# Patient Record
Sex: Female | Born: 1965
Health system: Southern US, Community
[De-identification: ages and names within clinical notes are randomized; demographics above are authoritative.]

## PROBLEM LIST (undated history)

## (undated) DIAGNOSIS — K219 Gastro-esophageal reflux disease without esophagitis: Secondary | ICD-10-CM

## (undated) DIAGNOSIS — J45909 Unspecified asthma, uncomplicated: Secondary | ICD-10-CM

## (undated) DIAGNOSIS — K829 Disease of gallbladder, unspecified: Secondary | ICD-10-CM

## (undated) DIAGNOSIS — I82409 Acute embolism and thrombosis of unspecified deep veins of unspecified lower extremity: Secondary | ICD-10-CM

## (undated) DIAGNOSIS — E785 Hyperlipidemia, unspecified: Secondary | ICD-10-CM

## (undated) DIAGNOSIS — N879 Dysplasia of cervix uteri, unspecified: Secondary | ICD-10-CM

## (undated) DIAGNOSIS — K769 Liver disease, unspecified: Secondary | ICD-10-CM

## (undated) DIAGNOSIS — M545 Low back pain, unspecified: Secondary | ICD-10-CM

## (undated) DIAGNOSIS — I10 Essential (primary) hypertension: Secondary | ICD-10-CM

## (undated) DIAGNOSIS — M1990 Unspecified osteoarthritis, unspecified site: Secondary | ICD-10-CM

## (undated) DIAGNOSIS — I2699 Other pulmonary embolism without acute cor pulmonale: Secondary | ICD-10-CM

## (undated) DIAGNOSIS — E669 Obesity, unspecified: Secondary | ICD-10-CM

## (undated) DIAGNOSIS — D649 Anemia, unspecified: Secondary | ICD-10-CM

## (undated) DIAGNOSIS — D869 Sarcoidosis, unspecified: Secondary | ICD-10-CM

## (undated) DIAGNOSIS — G4733 Obstructive sleep apnea (adult) (pediatric): Secondary | ICD-10-CM

## (undated) DIAGNOSIS — G4730 Sleep apnea, unspecified: Secondary | ICD-10-CM

## (undated) DIAGNOSIS — R011 Cardiac murmur, unspecified: Secondary | ICD-10-CM

## (undated) DIAGNOSIS — Z86718 Personal history of other venous thrombosis and embolism: Secondary | ICD-10-CM

## (undated) HISTORY — DX: Obesity, unspecified: E66.9

## (undated) HISTORY — DX: Hyperlipidemia, unspecified: E78.5

## (undated) HISTORY — DX: Gastro-esophageal reflux disease without esophagitis: K21.9

## (undated) HISTORY — DX: Low back pain: M54.5

## (undated) HISTORY — PX: TUBAL LIGATION: SHX77

## (undated) HISTORY — DX: Anemia, unspecified: D64.9

## (undated) HISTORY — DX: Unspecified asthma, uncomplicated: J45.909

## (undated) HISTORY — DX: Personal history of other venous thrombosis and embolism: Z86.718

## (undated) HISTORY — DX: Sleep apnea, unspecified: G47.30

## (undated) HISTORY — DX: Low back pain, unspecified: M54.50

## (undated) HISTORY — PX: GYNECOLOGIC CRYOSURGERY: SHX857

## (undated) HISTORY — DX: Other pulmonary embolism without acute cor pulmonale: I26.99

## (undated) HISTORY — DX: Sarcoidosis, unspecified: D86.9

## (undated) HISTORY — DX: Dysplasia of cervix uteri, unspecified: N87.9

## (undated) HISTORY — PX: CHOLECYSTECTOMY: SHX55

## (undated) HISTORY — DX: Disease of gallbladder, unspecified: K82.9

## (undated) HISTORY — DX: Liver disease, unspecified: K76.9

## (undated) HISTORY — PX: KNEE ARTHROSCOPY: SUR90

## (undated) HISTORY — DX: Essential (primary) hypertension: I10

## (undated) HISTORY — PX: BUNIONECTOMY: SHX129

## (undated) HISTORY — DX: Obstructive sleep apnea (adult) (pediatric): G47.33

## (undated) HISTORY — DX: Cardiac murmur, unspecified: R01.1

## (undated) HISTORY — DX: Acute embolism and thrombosis of unspecified deep veins of unspecified lower extremity: I82.409

## (undated) HISTORY — PX: COLPOSCOPY: SHX161

## (undated) HISTORY — DX: Unspecified osteoarthritis, unspecified site: M19.90

---

## 1998-04-12 ENCOUNTER — Other Ambulatory Visit: Admission: RE | Admit: 1998-04-12 | Discharge: 1998-04-12 | Payer: Self-pay | Admitting: Obstetrics and Gynecology

## 1999-09-21 ENCOUNTER — Encounter: Payer: Self-pay | Admitting: Family Medicine

## 1999-09-21 ENCOUNTER — Encounter: Admission: RE | Admit: 1999-09-21 | Discharge: 1999-09-21 | Payer: Self-pay | Admitting: Family Medicine

## 2000-04-18 ENCOUNTER — Encounter: Payer: Self-pay | Admitting: Family Medicine

## 2000-04-18 ENCOUNTER — Encounter: Admission: RE | Admit: 2000-04-18 | Discharge: 2000-04-18 | Payer: Self-pay | Admitting: Family Medicine

## 2000-05-14 ENCOUNTER — Encounter: Payer: Self-pay | Admitting: Gastroenterology

## 2000-05-14 ENCOUNTER — Ambulatory Visit (HOSPITAL_COMMUNITY): Admission: RE | Admit: 2000-05-14 | Discharge: 2000-05-14 | Payer: Self-pay | Admitting: Gastroenterology

## 2000-05-28 ENCOUNTER — Encounter: Payer: Self-pay | Admitting: Gastroenterology

## 2000-05-28 ENCOUNTER — Encounter: Admission: RE | Admit: 2000-05-28 | Discharge: 2000-05-28 | Payer: Self-pay | Admitting: Gastroenterology

## 2000-08-04 ENCOUNTER — Encounter: Payer: Self-pay | Admitting: Emergency Medicine

## 2000-08-04 ENCOUNTER — Emergency Department (HOSPITAL_COMMUNITY): Admission: EM | Admit: 2000-08-04 | Discharge: 2000-08-04 | Payer: Self-pay | Admitting: Emergency Medicine

## 2001-01-05 ENCOUNTER — Encounter: Payer: Self-pay | Admitting: Orthopaedic Surgery

## 2001-01-05 ENCOUNTER — Encounter: Admission: RE | Admit: 2001-01-05 | Discharge: 2001-01-05 | Payer: Self-pay | Admitting: Orthopaedic Surgery

## 2001-01-08 ENCOUNTER — Encounter: Admission: RE | Admit: 2001-01-08 | Discharge: 2001-02-20 | Payer: Self-pay | Admitting: Orthopaedic Surgery

## 2001-01-23 ENCOUNTER — Encounter: Admission: RE | Admit: 2001-01-23 | Discharge: 2001-01-23 | Payer: Self-pay | Admitting: Orthopaedic Surgery

## 2001-01-23 ENCOUNTER — Encounter: Payer: Self-pay | Admitting: Orthopaedic Surgery

## 2001-01-27 ENCOUNTER — Other Ambulatory Visit: Admission: RE | Admit: 2001-01-27 | Discharge: 2001-01-27 | Payer: Self-pay | Admitting: Family Medicine

## 2001-02-06 ENCOUNTER — Encounter: Admission: RE | Admit: 2001-02-06 | Discharge: 2001-02-06 | Payer: Self-pay | Admitting: Orthopaedic Surgery

## 2001-06-02 ENCOUNTER — Encounter: Payer: Self-pay | Admitting: Family Medicine

## 2001-06-02 ENCOUNTER — Encounter: Admission: RE | Admit: 2001-06-02 | Discharge: 2001-06-02 | Payer: Self-pay | Admitting: Family Medicine

## 2001-07-14 ENCOUNTER — Ambulatory Visit (HOSPITAL_COMMUNITY): Admission: RE | Admit: 2001-07-14 | Discharge: 2001-07-14 | Payer: Self-pay | Admitting: Gastroenterology

## 2001-07-30 ENCOUNTER — Encounter (INDEPENDENT_AMBULATORY_CARE_PROVIDER_SITE_OTHER): Payer: Self-pay

## 2001-07-30 ENCOUNTER — Encounter: Payer: Self-pay | Admitting: Gastroenterology

## 2001-07-30 ENCOUNTER — Ambulatory Visit (HOSPITAL_COMMUNITY): Admission: RE | Admit: 2001-07-30 | Discharge: 2001-07-30 | Payer: Self-pay | Admitting: Gastroenterology

## 2002-01-25 ENCOUNTER — Encounter: Admission: RE | Admit: 2002-01-25 | Discharge: 2002-04-25 | Payer: Self-pay | Admitting: Family Medicine

## 2002-01-27 ENCOUNTER — Encounter: Admission: RE | Admit: 2002-01-27 | Discharge: 2002-01-27 | Payer: Self-pay | Admitting: Family Medicine

## 2002-01-27 ENCOUNTER — Encounter: Payer: Self-pay | Admitting: Family Medicine

## 2002-05-17 ENCOUNTER — Encounter: Admission: RE | Admit: 2002-05-17 | Discharge: 2002-05-17 | Payer: Self-pay | Admitting: Family Medicine

## 2002-05-17 ENCOUNTER — Encounter: Payer: Self-pay | Admitting: Family Medicine

## 2002-07-04 ENCOUNTER — Encounter: Payer: Self-pay | Admitting: *Deleted

## 2002-07-04 ENCOUNTER — Observation Stay (HOSPITAL_COMMUNITY): Admission: EM | Admit: 2002-07-04 | Discharge: 2002-07-04 | Payer: Self-pay | Admitting: *Deleted

## 2002-08-09 ENCOUNTER — Ambulatory Visit (HOSPITAL_COMMUNITY): Admission: RE | Admit: 2002-08-09 | Discharge: 2002-08-09 | Payer: Self-pay | Admitting: *Deleted

## 2002-08-09 ENCOUNTER — Encounter: Payer: Self-pay | Admitting: *Deleted

## 2002-11-19 ENCOUNTER — Encounter: Payer: Self-pay | Admitting: Orthopaedic Surgery

## 2002-11-19 ENCOUNTER — Encounter: Admission: RE | Admit: 2002-11-19 | Discharge: 2002-11-19 | Payer: Self-pay | Admitting: Orthopaedic Surgery

## 2004-02-06 ENCOUNTER — Ambulatory Visit: Payer: Self-pay | Admitting: Gastroenterology

## 2004-02-08 ENCOUNTER — Ambulatory Visit (HOSPITAL_COMMUNITY): Admission: RE | Admit: 2004-02-08 | Discharge: 2004-02-08 | Payer: Self-pay | Admitting: Gastroenterology

## 2004-05-31 ENCOUNTER — Emergency Department (HOSPITAL_COMMUNITY): Admission: EM | Admit: 2004-05-31 | Discharge: 2004-05-31 | Payer: Self-pay | Admitting: Emergency Medicine

## 2004-08-07 ENCOUNTER — Encounter: Admission: RE | Admit: 2004-08-07 | Discharge: 2004-09-13 | Payer: Self-pay | Admitting: Specialist

## 2004-12-15 ENCOUNTER — Emergency Department (HOSPITAL_COMMUNITY): Admission: EM | Admit: 2004-12-15 | Discharge: 2004-12-15 | Payer: Self-pay | Admitting: Emergency Medicine

## 2005-01-08 ENCOUNTER — Ambulatory Visit (HOSPITAL_COMMUNITY): Admission: RE | Admit: 2005-01-08 | Discharge: 2005-01-08 | Payer: Self-pay | Admitting: Family Medicine

## 2005-03-04 ENCOUNTER — Emergency Department (HOSPITAL_COMMUNITY): Admission: EM | Admit: 2005-03-04 | Discharge: 2005-03-04 | Payer: Self-pay | Admitting: Emergency Medicine

## 2005-05-08 ENCOUNTER — Encounter: Admission: RE | Admit: 2005-05-08 | Discharge: 2005-05-08 | Payer: Self-pay | Admitting: Family Medicine

## 2006-04-20 ENCOUNTER — Emergency Department (HOSPITAL_COMMUNITY): Admission: EM | Admit: 2006-04-20 | Discharge: 2006-04-20 | Payer: Self-pay | Admitting: Family Medicine

## 2006-04-30 ENCOUNTER — Ambulatory Visit (HOSPITAL_COMMUNITY): Admission: RE | Admit: 2006-04-30 | Discharge: 2006-04-30 | Payer: Self-pay | Admitting: Specialist

## 2006-09-05 ENCOUNTER — Emergency Department (HOSPITAL_COMMUNITY): Admission: EM | Admit: 2006-09-05 | Discharge: 2006-09-05 | Payer: Self-pay | Admitting: Family Medicine

## 2008-02-17 ENCOUNTER — Encounter: Admission: RE | Admit: 2008-02-17 | Discharge: 2008-02-17 | Payer: Self-pay | Admitting: Family Medicine

## 2008-11-03 ENCOUNTER — Encounter: Payer: Self-pay | Admitting: Family Medicine

## 2008-11-03 ENCOUNTER — Ambulatory Visit: Payer: Self-pay | Admitting: Vascular Surgery

## 2008-11-03 ENCOUNTER — Ambulatory Visit (HOSPITAL_COMMUNITY): Admission: RE | Admit: 2008-11-03 | Discharge: 2008-11-03 | Payer: Self-pay | Admitting: Family Medicine

## 2009-09-22 LAB — HM PAP SMEAR: HM Pap smear: NORMAL

## 2009-09-27 ENCOUNTER — Encounter: Admission: RE | Admit: 2009-09-27 | Discharge: 2009-09-27 | Payer: Self-pay | Admitting: Family Medicine

## 2010-03-18 ENCOUNTER — Encounter: Payer: Self-pay | Admitting: Family Medicine

## 2010-06-05 ENCOUNTER — Inpatient Hospital Stay (INDEPENDENT_AMBULATORY_CARE_PROVIDER_SITE_OTHER)
Admission: RE | Admit: 2010-06-05 | Discharge: 2010-06-05 | Disposition: A | Discharge status: Home / Self Care | Payer: BLUE CROSS/BLUE SHIELD | Source: Ambulatory Visit | Attending: Emergency Medicine | Admitting: Emergency Medicine

## 2010-06-05 DIAGNOSIS — I10 Essential (primary) hypertension: Secondary | ICD-10-CM

## 2010-06-05 DIAGNOSIS — M722 Plantar fascial fibromatosis: Secondary | ICD-10-CM

## 2010-06-05 LAB — POCT I-STAT, CHEM 8
BUN: 10 mg/dL (ref 6–23)
Calcium, Ion: 1.17 mmol/L (ref 1.12–1.32)
Chloride: 103 mEq/L (ref 96–112)
Creatinine, Ser: 0.8 mg/dL (ref 0.4–1.2)
Glucose, Bld: 178 mg/dL — ABNORMAL HIGH (ref 70–99)
HCT: 46 % (ref 36.0–46.0)
Hemoglobin: 15.6 g/dL — ABNORMAL HIGH (ref 12.0–15.0)
Potassium: 4.3 mEq/L (ref 3.5–5.1)
TCO2: 27 mmol/L (ref 0–100)

## 2010-07-13 NOTE — H&P (Signed)
NAME:  Victoria Padilla, Victoria Padilla                        ACCOUNT NO.:  [id number]   MEDICAL RECORD NO.:  [id number]                   PATIENT TYPE:  INP   LOCATION:  5504                                 FACILITY:  [facility]   PHYSICIAN:  Leighton Roach McDiarmid, M.D.             DATE OF BIRTH:  Dec 27, 1965   DATE OF ADMISSION:  07/04/2002  DATE OF DISCHARGE:                                HISTORY & PHYSICAL   PRIMARY CARE PHYSICIAN:  Film/video editor.   CHIEF COMPLAINT:  Chest pain.   HISTORY OF PRESENT ILLNESS:  A 45 year old African-American female presented  to the ER with chest pain.  She was lying on the floor watching television  on the day prior to admission and had onset of substernal chest pressure.  This pressure worsened to chest pain 10/10 and lasted for one hour and then  began spontaneously resolving.  The patient drove herself to the emergency  room.  She reports associated nausea, lightheadedness, and feeling very hot  with radiation of the pain to her neck.  Denies diaphoresis, shortness of  breath.  Pain better with sitting up and with shallow breathing.  Currently,  in the ER was essentially resolved at 0/10 but she did have some residual  pressure in her chest.  She did receive one sublingual nitroglycerin in the  ER but the pain had really already resolved by that time.  She does report  six episodes of chest pain like this since she had a cholecystectomy  secondary to gallstones in 1997 and feels that this pain is very similar to  her gallbladder pain.   PAST MEDICAL HISTORY:  1. Diabetes with proteinuria.  2. Hypertension.  3. Gastroesophageal reflux disease.  4. Laparoscopic cholecystectomy 1997.  5. History of sarcoidosis per patient report.   ALLERGIES:  ASPIRIN and NAPROXEN cause abdominal pain.   MEDICATIONS:  1. Amaryl 2 mg p.o. b.i.d.  2. Mavik 2 mg p.o. b.i.d.  3. HCTZ 25 mg p.o. daily.  4. Meridia 15 mg p.o. daily.  5. Allegra 60 mg p.o. daily  p.r.n.  6. Nasacort p.r.n.  7. Zantac 300 mg p.o. q.h.s.  8. Mobic 7.5 mg p.o. b.i.d.   REVIEW OF SYSTEMS:  Chronic lower extremity edema on and off.  The patient  has been having seasonal allergies with cough and sore throat last few days.  Also reports nagging frontal headache today with onset prior to receiving  nitroglycerin.   SOCIAL HISTORY:  Administrator, [employer].  She is single and has three  children.  Denies history of alcohol, drug, or tobacco use.   FAMILY HISTORY:  Mother had diabetes, died of AIDS which she acquired from a  blood transfusion.  Father died in his 44s of a myocardial infarction.   PHYSICAL EXAMINATION:  VITAL SIGNS:  Temp 98.2, pulse 104, respirations 16,  blood pressure 142/70.  Pulse oximetry 1005 on room air.  GENERAL:  No apparent distress.  Alert and oriented x4.  HEENT:  Pupils equal, round, reactive to light.  Extraocular movements  intact.  Normocephalic, atraumatic. Nares clear.  Oropharynx with moderate  erythema and scant exudate on the left.  NECK:  Supple.  No lymphadenopathy.  No thyromegaly.  No JVD.  CARDIOVASCULAR:  Regular rate and rhythm.  A 2/6 crescendo systolic murmur  best heard right upper sternal border.  LUNGS:  Clear to auscultation bilaterally.  ABDOMEN:  Soft, nontender, nondistended.  Positive bowel sounds.  No  hepatosplenomegaly.  EXTREMITIES:  Trace to 1+ lower extremity edema, 2+ dorsalis pedis pulses,  distal sensation intact.  BACK:  No spine or CVA tenderness.   LABORATORY AND ACCESSORY DATA:  Electrolytes showed sodium 137, calcium 4.2,  chloride 100, CO2 30, BUN 13, creatinine 1.1, glucose 148.  WBCs 7.2,  hemoglobin 12.3, platelets 313 with a normal differential.  First set of  cardiac enzymes shows CK 240, MB 1.5, index 0.6, troponin 0.01.   Chest x-ray on admission shows mild cardiomegaly, no acute disease.  EKG  shows normal sinus rhythm with a rate of 81, biphasic T waves throughout, no  significant ST  or T wave changes.   ASSESSMENT:  A 45 year old African-American female with chest pain here for  a rule out myocardial infarction.   PLAN:  1. Chest pain.  Atypical in nature.  Coronary artery disease risk factors     are hypertension, family history, diabetes, as well as a history of high     cholesterol in the past per the patient report.  Admit to telemetry,     cycle enzymes, repeat EKG, continue ACE inhibitor, p.r.n. nitroglycerin,     no ASPIRIN as the patient has an ALLERGY.  Will start PPI for possible     GERD etiology.  If the patient rules out, she will need inpatient versus     outpatient risk stratification.  Suspect GI versus musculoskeletal cause     but possibly her history of sarcoid may play a role, check TSH and     fasting lipid panel during admission.  2. Hypertension.  Continue Mavik and HCTZ.  3. Allergic rhinitis.  She may also have a viral URI.  Will continue Allegra     and Nasacort.     Noelle C. Merilynn Finland, M.D.                 Etta Grandchild, M.D.    NCR/MEDQ  D:  07/04/2002  T:  07/05/2002  Job:  [id number]   cc:   [facility] Post [facility]

## 2010-07-13 NOTE — Cardiovascular Report (Signed)
NAME:  Victoria Padilla, Victoria Padilla                        ACCOUNT NO.:  [id number]   MEDICAL RECORD NO.:  [id number]                   PATIENT TYPE:  OIB   LOCATION:  2855                                 FACILITY:  [facility]   PHYSICIAN:  Darlin Priestly, M.D.             DATE OF BIRTH:  August 26, 1965   DATE OF PROCEDURE:  08/09/2002  DATE OF DISCHARGE:                              CARDIAC CATHETERIZATION   PROCEDURES PERFORMED:  1. Left catheterization.  2. Coronary angiography.  3. Left ventriculogram.   CARDIOLOGIST:  Darlin Priestly, M.D.   COMPLICATIONS:  None.   INDICATIONS:  [name] is a 45 year old female, patient of Dr. Tawanna Cooler  McDiarmid and Dr. Hal Hope of [facility], with a history of  noninsulin-dependent diabetes mellitus and chest pain.  She underwent  Cardiolite scan revealing the suggestion of mild anterior wall ischemia.  She is now referred for cardiac catheterization to assess her coronary  status.   DESCRIPTION OF PROCEDURE:  After obtaining informed consent the patient was  brought to the cardiac cath lab.  The right groin was shaved, prepped and  draped in the usual sterile fashion.  ECG monitoring was established.  Using  the modified Seldinger technique a #6 French sheath was inserted in the  right femoral artery.  Six French diagnostic catheters were then used to  perform diagnostic angiography.   ANGIOGRAPHIC DATA:  Left Main Coronary Artery:  Angiography revealed a large  left main with no significant disease.   Left Anterior Descending Artery:  The LAD is a large vessel that courses the  apex. There were two diagonal branches.  The LAD has no significant disease.  The first and second diagonals are medium-sized vessels with no significant  disease.   Left Circumflex:  The left circumflex is a medium-sized vessel that courses  the AV groove and gives rise to two obtuse marginal branches.  The AV groove  circ has no significant disease.   The first OM is a large vessel, which  bifurcates in its proximal segment and has no significant disease.  The  second OM is a small vessel with no significant disease.   Right Coronary Artery:  The right coronary artery is a medium-sized vessel,  which is dominant and gives rise to the PDA as well as a the posterolateral  branch.  There is no significant disease in the RCA, PDA or posterolateral  branch.   LEFT VENTRICULOGRAM:  The left ventriculogram reveals a mildly depressed EF  of 45-50% with mild global hypokinesis.   HEMODYNAMIC DATA:  Systemic arterial pressure 117/80.  LV systemic pressure  111/13.  LVEDP of 19.    CONCLUSION:  1. No significant coronary artery disease.  2. Mildly depressed left ventricular systolic function.  Darlin Priestly, M.D.    RHM/MEDQ  D:  08/09/2002  T:  08/09/2002  Job:  [id number]   cc:   Leighton Roach McDiarmid, M.D.  [address] [place]  [place] [address]  Fax: [phone]   Marcos Eke. Hal Hope, M.D.  [address] [place]  [place] [address]  Fax: [phone]

## 2010-07-13 NOTE — Op Note (Signed)
NAMESHARMON, CHERAMIE NO.:  [id number]   MEDICAL RECORD NO.:  [id number]          PATIENT TYPE:  AMB   LOCATION:  DAY                          FACILITY:  [facility]   PHYSICIAN:  Jene Every, M.D.    DATE OF BIRTH:  1965/12/11   DATE OF PROCEDURE:  04/30/2006  DATE OF DISCHARGE:                               OPERATIVE REPORT   PREOPERATIVE DIAGNOSIS:  Medial meniscus tear left knee.   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   PROCEDURE PERFORMED:  Left knee arthroscopy, partial medial  meniscectomy, chondroplasty patella, medial femoral condyle.   INDICATIONS FOR PROCEDURE:  A 45 year old with medial meniscus tear on  MRI, persistent pain despite conservative treatments, operative  intervention was indicated for diagnosis and treatment.  Risks and  benefits discussed including bleeding, infection, damage to surrounding  structures, DVT, PE and anesthetic complications, no change in symptoms,  worsening symptoms, etc.   The patient in supine position after induction of adequate general  anesthesia and 2 g of Kefzol, left lower extremity was prepped and  draped in the usual sterile fashion.  A lateral parapatellar portal and  superomedial parapatellar portal was fashioned with a #11 blade.  __________ cannula atraumatically placed.  Irrigant was utilized to  insufflate the joint.  Under direct visualization, a medial parapatellar  portal was fashioned with a #11 blade sparing the medial meniscus.  Medial meniscus tear of posterior horn was noted.  Collene Mares was introduced  and utilized to perform partial medial meniscectomy to a stable base.  Chondroplasty of the femoral condyle was performed as well.  And of the  patella showed some grade III changes, there was normal patellofemoral  tracking.  Gutters were unremarkable as well.  Lateral compartment was  unremarkable, normal meniscus,  femoral condyle and tibial plateau all  stable to probe palpation.  No evidence of tearing.  After the partial  medial meniscectomy, we examined the remnants, stable to probe  palpation.  No residual pathology noted.  Knee was copiously lavaged.  ACL and PCL were unremarkable.  All instrumentation was removed.  Portals were closed with 4-0 nylon simple sutures, 0.25% Marcaine with  epinephrine was infiltrated in the joint.  Wound was dressed sterilely.  She was awoken without difficulty and transported to the recovery room  in satisfactory condition.   Patient tolerated the procedure well.  There were no complications.      Jene Every, M.D.  Electronically Signed     JB/MEDQ  D:  04/30/2006  T:  04/30/2006  Job:  [id number]

## 2010-07-13 NOTE — Discharge Summary (Signed)
NAME:  Victoria Padilla, Victoria Padilla                        ACCOUNT NO.:  [id number]   MEDICAL RECORD NO.:  [id number]                   PATIENT TYPE:  INP   LOCATION:  5504                                 FACILITY:  [facility]   PHYSICIAN:  Nilda Simmer, M.D.                  DATE OF BIRTH:  [birth year]   DATE OF ADMISSION:  07/04/2002  DATE OF DISCHARGE:  07/04/2002                                 DISCHARGE SUMMARY   PROCEDURE PERFORMED:  1. Electrocardiogram.  2. Chest x-ray.   CONSULTATIONS:  None.   DISCHARGE DIAGNOSES:  1. Chest pain, atypical.  2. Hypertension.  3. Diabetes mellitus type 2.  4. Obesity.  5. Gastroesophageal reflux disease.   DISCHARGE MEDICATIONS:  1. Amaryl 2 mg one tablet p.o. twice daily.  2. Mavik 2 mg one tablet p.o. twice daily.  3. Hydrochlorothiazide 25 mg one tablet p.o. daily.  4. Meridia 15 mg one tablet p.o. daily.  5. Mobic 7.5 mg one tablet p.o. twice daily.  6. Protonix 40 mg one tablet p.o. daily.  7. Nasacort two sprays to nostril daily as needed.  8. Allegra 60 mg one tablet p.o. twice daily as needed.   FOLLOW UP:  1. [facility].  The patient should call for follow-up     appointment in the upcoming one to two weeks.  2. [facility].  The patient will be contacted in the     next 24 hours to schedule for a Cardiolite stress test.   HOSPITAL COURSE:  The patient is a 45 year old African-American female with  cardiac risk factors including diabetes mellitus, hypertension, strong  family history of premature coronary artery disease, obesity presenting with  substernal chest pain.  The patient was at rest with onset of symptoms that  included nausea and diaphoresis, radiation to the neck.  Chest pain resolved  by the time the patient presented to the emergency department.  Initial  cardiac enzymes were normal.  An EKG was stable without acute changes.  Electrolytes were stable.  Chest x-ray revealed mild  cardiomegaly; however,  no active disease.  The patient was admitted for rule out of myocardial  infarction.  The patient remained asymptomatic during admission.  A second  troponin I was within normal limits.  Due to the atypical nature of chest  pain, the patient was discharged to home.  The patient will follow-up this  week for stress Cardiolite at [facility].   DISCHARGE LABS:  Potassium 4.2, sodium 137, chloride 100, bicarb 30, BUN 13,  creatinine 1.1, glucose 148, white blood cell count 7200, hemoglobin 12.5,  platelet count 313,000, CK 240, MB 1.5, index 0.6 and troponin I 0.01.  Repeat troponin I of 0.01.  EKG revealed normal sinus rhythm with biphasic T  waves; however, no acute ST or T wave changes.  Chest x-ray revealed mild  cardiomegaly and no active  disease.   DISCHARGE INSTRUCTIONS:  Diet -  Recommend low salt, low sugar diet.  Wound care - Not applicable.  Special instructions - The patient is recommended to contact physician if  chest pain were to recur.  Activity - No restrictions; however, recommend patient avoid strenuous  exercise until Cardiolite performed.                                               Nilda Simmer, M.D.    KS/MEDQ  D:  07/04/2002  T:  07/06/2002  Job:  [id number]

## 2010-09-19 ENCOUNTER — Encounter: Payer: Self-pay | Admitting: Family Medicine

## 2010-09-19 DIAGNOSIS — E785 Hyperlipidemia, unspecified: Secondary | ICD-10-CM | POA: Insufficient documentation

## 2010-09-19 DIAGNOSIS — D869 Sarcoidosis, unspecified: Secondary | ICD-10-CM | POA: Insufficient documentation

## 2010-09-19 DIAGNOSIS — E669 Obesity, unspecified: Secondary | ICD-10-CM | POA: Insufficient documentation

## 2010-09-25 ENCOUNTER — Other Ambulatory Visit: Payer: Self-pay | Admitting: Physician Assistant

## 2010-09-25 DIAGNOSIS — R7989 Other specified abnormal findings of blood chemistry: Secondary | ICD-10-CM

## 2010-09-27 ENCOUNTER — Other Ambulatory Visit: Payer: BLUE CROSS/BLUE SHIELD

## 2010-10-02 ENCOUNTER — Ambulatory Visit
Admission: RE | Admit: 2010-10-02 | Discharge: 2010-10-02 | Disposition: A | Discharge status: Home / Self Care | Payer: BLUE CROSS/BLUE SHIELD | Source: Ambulatory Visit | Attending: Physician Assistant | Admitting: Physician Assistant

## 2010-10-02 DIAGNOSIS — R7989 Other specified abnormal findings of blood chemistry: Secondary | ICD-10-CM

## 2011-10-04 ENCOUNTER — Ambulatory Visit (INDEPENDENT_AMBULATORY_CARE_PROVIDER_SITE_OTHER): Payer: BLUE CROSS/BLUE SHIELD | Admitting: Women's Health

## 2011-10-04 ENCOUNTER — Encounter: Payer: Self-pay | Admitting: Women's Health

## 2011-10-04 VITALS — BP 140/92 | Ht 63.0 in | Wt 252.0 lb

## 2011-10-04 DIAGNOSIS — N879 Dysplasia of cervix uteri, unspecified: Secondary | ICD-10-CM | POA: Insufficient documentation

## 2011-10-04 DIAGNOSIS — L293 Anogenital pruritus, unspecified: Secondary | ICD-10-CM

## 2011-10-04 DIAGNOSIS — M1990 Unspecified osteoarthritis, unspecified site: Secondary | ICD-10-CM | POA: Insufficient documentation

## 2011-10-04 DIAGNOSIS — E1165 Type 2 diabetes mellitus with hyperglycemia: Secondary | ICD-10-CM | POA: Insufficient documentation

## 2011-10-04 DIAGNOSIS — N898 Other specified noninflammatory disorders of vagina: Secondary | ICD-10-CM

## 2011-10-04 LAB — WET PREP FOR TRICH, YEAST, CLUE
Clue Cells Wet Prep HPF POC: NONE SEEN
Trich, Wet Prep: NONE SEEN

## 2011-10-04 MED ORDER — TERCONAZOLE 0.4 % VA CREA: 1.0000 | TOPICAL_CREAM | Freq: Every day | VAGINAL | Status: AC

## 2011-10-04 NOTE — Progress Notes (Signed)
Patient ID: Victoria Padilla, female   DOB: 1965/12/20, 46 y.o.   MRN: [id number] Presents with the complaint of intense vaginal itching.  Has numerous health problems of diabetes, hypertension, hyperlipidemia, obesity and sarcoidosis. Has annual exams at her primary care and brought a copy of her Pap that was normal with negative HR. HPV 06/2011. Denies any urinary symptoms. States her blood sugars have been elevated, her last hemoglobin A1c was 9. Monthly cycle, not sexually active for greater than 2 years.  Exam: External genitalia extremely erythematous, speculum exam moderate amount of a white curdy discharge present vaginal walls also erythematous. Wet prep positive for moderate yeast, bimanual no CMT or adnexal fullness or tenderness.  Uncontrolled Diabetes with yeast infection  Plan: Reviewed importance of diabetes maintenance in relationship to preventing yeast infections. Terazol 7 one applicator at bedtime x7 and apply external when necessary prescription with refill given instructed to call if no relief of symptoms.

## 2011-10-04 NOTE — Patient Instructions (Addendum)
Monilial Vaginitis Vaginitis in a soreness, swelling and redness (inflammation) of the vagina and vulva. Monilial vaginitis is not a sexually transmitted infection. CAUSES  Yeast vaginitis is caused by yeast (candida) that is normally found in your vagina. With a yeast infection, the candida has overgrown in number to a point that upsets the chemical balance. SYMPTOMS   White, thick vaginal discharge.   Swelling, itching, redness and irritation of the vagina and possibly the lips of the vagina (vulva).   Burning or painful urination.   Painful intercourse.  DIAGNOSIS  Things that may contribute to monilial vaginitis are:  Postmenopausal and virginal states.   Pregnancy.   Infections.   Being tired, sick or stressed, especially if you had monilial vaginitis in the past.   Diabetes. Good control will help lower the chance.   Birth control pills.   Tight fitting garments.   Using bubble bath, feminine sprays, douches or deodorant tampons.   Taking certain medications that kill germs (antibiotics).   Sporadic recurrence can occur if you become ill.  TREATMENT  Your caregiver will give you medication.  There are several kinds of anti monilial vaginal creams and suppositories specific for monilial vaginitis. For recurrent yeast infections, use a suppository or cream in the vagina 2 times a week, or as directed.   Anti-monilial or steroid cream for the itching or irritation of the vulva may also be used. Get your caregiver's permission.   Painting the vagina with methylene blue solution may help if the monilial cream does not work.   Eating yogurt may help prevent monilial vaginitis.  HOME CARE INSTRUCTIONS   Finish all medication as prescribed.   Do not have sex until treatment is completed or after your caregiver tells you it is okay.   Take warm sitz baths.   Do not douche.   Do not use tampons, especially scented ones.   Wear cotton underwear.   Avoid tight  pants and panty hose.   Tell your sexual partner that you have a yeast infection. They should go to their caregiver if they have symptoms such as mild rash or itching.   Your sexual partner should be treated as well if your infection is difficult to eliminate.   Practice safer sex. Use condoms.   Some vaginal medications cause latex condoms to fail. Vaginal medications that harm condoms are:   Cleocin cream.   Butoconazole (Femstat).   Terconazole (Terazol) vaginal suppository.   Miconazole (Monistat) (may be purchased over the counter).  SEEK MEDICAL CARE IF:   You have a temperature by mouth above 102 F (38.9 C).   The infection is getting worse after 2 days of treatment.   The infection is not getting better after 3 days of treatment.   You develop blisters in or around your vagina.   You develop vaginal bleeding, and it is not your menstrual period.   You have pain when you urinate.   You develop intestinal problems.   You have pain with sexual intercourse.  Document Released: 11/21/2004 Document Revised: 01/31/2011 Document Reviewed: 08/05/2008 ExitCare Patient Information 2012 [employer].Diabetes and Exercise Regular exercise is important and can help:   Control blood glucose (sugar).   Decrease blood pressure.    Control blood lipids (cholesterol, triglycerides).   Improve overall health.  BENEFITS FROM EXERCISE  Improved fitness.   Improved flexibility.   Improved endurance.   Increased bone density.   Weight control.   Increased muscle strength.   Decreased body fat.     Improvement of the body's use of insulin, a hormone.   Increased insulin sensitivity.   Reduction of insulin needs.   Reduced stress and tension.   Helps you feel better.  People with diabetes who add exercise to their lifestyle gain additional benefits, including:  Weight loss.   Reduced appetite.   Improvement of the body's use of blood glucose.    Decreased risk factors for heart disease:   Lowering of cholesterol and triglycerides.   Raising the level of good cholesterol (high-density lipoproteins, HDL).   Lowering blood sugar.   Decreased blood pressure.  TYPE 1 DIABETES AND EXERCISE  Exercise will usually lower your blood glucose.   If blood glucose is greater than 240 mg/dl, check urine ketones. If ketones are present, do not exercise.   Location of the insulin injection sites may need to be adjusted with exercise. Avoid injecting insulin into areas of the body that will be exercised. For example, avoid injecting insulin into:   The arms when playing tennis.   The legs when jogging. For more information, discuss this with your caregiver.   Keep a record of:   Food intake.   Type and amount of exercise.   Expected peak times of insulin action.   Blood glucose levels.  Do this before, during, and after exercise. Review your records with your caregiver. This will help you to develop guidelines for adjusting food intake and insulin amounts.  TYPE 2 DIABETES AND EXERCISE  Regular physical activity can help control blood glucose.   Exercise is important because it may:   Increase the body's sensitivity to insulin.   Improve blood glucose control.   Exercise reduces the risk of heart disease. It decreases serum cholesterol and triglycerides. It also lowers blood pressure.   Those who take insulin or oral hypoglycemic agents should watch for signs of hypoglycemia. These signs include dizziness, shaking, sweating, chills, and confusion.   Body water is lost during exercise. It must be replaced. This will help to avoid loss of body fluids (dehydration) or heat stroke.  Be sure to talk to your caregiver before starting an exercise program to make sure it is safe for you. Remember, any activity is better than none.  Document Released: 05/04/2003 Document Revised: 01/31/2011 Document Reviewed: 08/18/2008 ExitCare  Patient Information 2012 [employer]. 

## 2012-04-18 ENCOUNTER — Emergency Department (HOSPITAL_COMMUNITY)
Admission: EM | Admit: 2012-04-18 | Discharge: 2012-04-18 | Disposition: A | Discharge status: Home / Self Care | Payer: BC Managed Care – PPO | Attending: Emergency Medicine | Admitting: Emergency Medicine

## 2012-04-18 ENCOUNTER — Encounter (HOSPITAL_COMMUNITY): Payer: Self-pay | Admitting: Emergency Medicine

## 2012-04-18 DIAGNOSIS — Z8742 Personal history of other diseases of the female genital tract: Secondary | ICD-10-CM | POA: Insufficient documentation

## 2012-04-18 DIAGNOSIS — Y9289 Other specified places as the place of occurrence of the external cause: Secondary | ICD-10-CM | POA: Insufficient documentation

## 2012-04-18 DIAGNOSIS — I10 Essential (primary) hypertension: Secondary | ICD-10-CM | POA: Insufficient documentation

## 2012-04-18 DIAGNOSIS — Z794 Long term (current) use of insulin: Secondary | ICD-10-CM | POA: Insufficient documentation

## 2012-04-18 DIAGNOSIS — Z862 Personal history of diseases of the blood and blood-forming organs and certain disorders involving the immune mechanism: Secondary | ICD-10-CM | POA: Insufficient documentation

## 2012-04-18 DIAGNOSIS — Z8739 Personal history of other diseases of the musculoskeletal system and connective tissue: Secondary | ICD-10-CM | POA: Insufficient documentation

## 2012-04-18 DIAGNOSIS — E119 Type 2 diabetes mellitus without complications: Secondary | ICD-10-CM | POA: Insufficient documentation

## 2012-04-18 DIAGNOSIS — Y9389 Activity, other specified: Secondary | ICD-10-CM | POA: Insufficient documentation

## 2012-04-18 DIAGNOSIS — E669 Obesity, unspecified: Secondary | ICD-10-CM | POA: Insufficient documentation

## 2012-04-18 DIAGNOSIS — S39012A Strain of muscle, fascia and tendon of lower back, initial encounter: Secondary | ICD-10-CM

## 2012-04-18 DIAGNOSIS — E785 Hyperlipidemia, unspecified: Secondary | ICD-10-CM | POA: Insufficient documentation

## 2012-04-18 DIAGNOSIS — Z79899 Other long term (current) drug therapy: Secondary | ICD-10-CM | POA: Insufficient documentation

## 2012-04-18 DIAGNOSIS — Z8619 Personal history of other infectious and parasitic diseases: Secondary | ICD-10-CM | POA: Insufficient documentation

## 2012-04-18 DIAGNOSIS — Z8639 Personal history of other endocrine, nutritional and metabolic disease: Secondary | ICD-10-CM | POA: Insufficient documentation

## 2012-04-18 DIAGNOSIS — S335XXA Sprain of ligaments of lumbar spine, initial encounter: Secondary | ICD-10-CM | POA: Insufficient documentation

## 2012-04-18 DIAGNOSIS — M129 Arthropathy, unspecified: Secondary | ICD-10-CM | POA: Insufficient documentation

## 2012-04-18 DIAGNOSIS — Z3202 Encounter for pregnancy test, result negative: Secondary | ICD-10-CM | POA: Insufficient documentation

## 2012-04-18 DIAGNOSIS — X500XXA Overexertion from strenuous movement or load, initial encounter: Secondary | ICD-10-CM | POA: Insufficient documentation

## 2012-04-18 LAB — POCT PREGNANCY, URINE: Preg Test, Ur: NEGATIVE

## 2012-04-18 LAB — URINALYSIS, ROUTINE W REFLEX MICROSCOPIC
Bilirubin Urine: NEGATIVE
Glucose, UA: >1000 mg/dL — AB
Hgb urine dipstick: NEGATIVE
Nitrite: NEGATIVE
Specific Gravity, Urine: 1.035 — ABNORMAL HIGH (ref 1.005–1.030)
Urobilinogen, UA: 1 mg/dL (ref 0.0–1.0)
pH: 6.5 (ref 5.0–8.0)

## 2012-04-18 LAB — URINE MICROSCOPIC-ADD ON

## 2012-04-18 MED ORDER — CYCLOBENZAPRINE HCL 5 MG PO TABS: 5.0000 mg | ORAL_TABLET | Freq: Three times a day (TID) | ORAL | Status: DC | PRN

## 2012-04-18 MED ORDER — HYDROCODONE-ACETAMINOPHEN 5-325 MG PO TABS: 1.0000 | ORAL_TABLET | Freq: Four times a day (QID) | ORAL | Status: DC | PRN

## 2012-04-18 MED ORDER — AMLODIPINE BESYLATE 10 MG PO TABS
10.0000 mg | ORAL_TABLET | Freq: Every day | ORAL | Status: DC
  Administered 2012-04-18: 10 mg via ORAL
  Filled 2012-04-18: qty 1

## 2012-04-18 NOTE — ED Notes (Signed)
Pt c/o lower back pain, onset 5 days ago.  No known injury.

## 2012-04-18 NOTE — ED Notes (Signed)
Pt presents with lower back pain that began Tuesday and has gotten worse since.  Pt was shoveling snow before the pain began.  Denies any difficulty urinating.  Pain is worse with movement.

## 2012-04-18 NOTE — ED Provider Notes (Signed)
History    CSN: [id number] Arrival date & time 04/18/12  First MD Initiated Contact with Patient 04/18/12 1921      Chief Complaint  Patient presents with  . Back Pain    Patient is a 47 y.o. female presenting with back pain. The history is provided by the patient.  Back Pain Location:  Lumbar spine Quality:  Aching Radiates to:  Does not radiate Pain severity:  Moderate Onset quality:  Gradual Timing:  Constant Chronicity:  Recurrent Context: not falling and not MVA   Context comment:  The symptoms started after she had to shovel the snow from her driveway..  she does have history of herniated discs in the lumbar spine region Relieved by:  Nothing Associated symptoms: no abdominal pain, no abdominal swelling, no bladder incontinence, no bowel incontinence, no fever, no leg pain, no numbness, no paresthesias, no perianal numbness and no weakness     Past Medical History  Diagnosis Date  . Arthritis   . Hypertension   . Sarcoidosis   . LBP (low back pain)   . Hyperlipidemia   . Vitamin D deficiency   . Obesity   . Cervical dysplasia   . Diabetes mellitus     Past Surgical History  Procedure Laterality Date  . Cholecystectomy    . Bunionectomy    . Knee arthroscopy    . Colposcopy    . Gynecologic cryosurgery    . Tubal ligation      Family History  Problem Relation Age of Onset  . Heart failure Mother   . Heart failure Father   . Hypertension Maternal Uncle   . Colon cancer Maternal Grandmother     History  Substance Use Topics  . Smoking status: Never Smoker   . Smokeless tobacco: Not on file  . Alcohol Use: No    OB History   Grav Para Term Preterm Abortions TAB SAB Ect Mult Living   3 3 3       3       Review of Systems  Constitutional: Negative for fever.  Gastrointestinal: Negative for abdominal pain and bowel incontinence.  Genitourinary: Negative for bladder incontinence.  Musculoskeletal: Positive for back pain.  Neurological: Negative  for weakness, numbness and paresthesias.  All other systems reviewed and are negative.    Allergies  Aspirin and Naprosyn  Home Medications   Current Outpatient Rx  Name  Route  Sig  Dispense  Refill  . albuterol (PROVENTIL) (2.5 MG/3ML) 0.083% nebulizer solution   Nebulization   Take 2.5 mg by nebulization every 6 (six) hours as needed.           Marland Kitchen amLODipine (NORVASC) 10 MG tablet   Oral   Take 10 mg by mouth daily.           . Atorvastatin Calcium (LIPITOR PO)   Oral   Take by mouth.         . benazepril (LOTENSIN) 20 MG tablet   Oral   Take 20 mg by mouth daily.           . Cholecalciferol (VITAMIN D) 2000 UNITS CAPS   Oral   Take by mouth.           . cyclobenzaprine (FLEXERIL) 5 MG tablet   Oral   Take 1 tablet (5 mg total) by mouth 3 (three) times daily as needed for muscle spasms.   21 tablet   0   . diclofenac (VOLTAREN) 50 MG EC tablet  Oral   Take 50 mg by mouth 2 (two) times daily.           . fexofenadine (ALLEGRA) 180 MG tablet   Oral   Take 180 mg by mouth daily.           . Fluticasone-Salmeterol (ADVAIR DISKUS) 100-50 MCG/DOSE AEPB   Inhalation   Inhale 1 puff into the lungs every 12 (twelve) hours.           . furosemide (LASIX) 20 MG tablet   Oral   Take 20 mg by mouth daily.           Marland Kitchen glipiZIDE (GLUCOTROL XL) 10 MG 24 hr tablet   Oral   Take 10 mg by mouth daily.           . hydrochlorothiazide 25 MG tablet   Oral   Take 25 mg by mouth daily.           Marland Kitchen HYDROcodone-acetaminophen (NORCO) 5-325 MG per tablet   Oral   Take 1-2 tablets by mouth every 6 (six) hours as needed for pain.   16 tablet   0   . Insulin Human (INSULIN PUMP) 100 unit/ml SOLN   Subcutaneous   Inject into the skin.         . metFORMIN (GLUCOPHAGE) 1000 MG tablet   Oral   Take 1,000 mg by mouth 2 (two) times daily with a meal.           . Omega-3 Fatty Acids (FISH OIL PO)   Oral   Take by mouth.         . pantoprazole  (PROTONIX) 40 MG tablet   Oral   Take 40 mg by mouth daily.           . pioglitazone (ACTOS) 45 MG tablet   Oral   Take 45 mg by mouth daily.           . pravastatin (PRAVACHOL) 40 MG tablet   Oral   Take 40 mg by mouth daily.           . sitaGLIPtin (JANUVIA) 100 MG tablet   Oral   Take 100 mg by mouth daily.           . vitamin E 100 UNIT capsule   Oral   Take 100 Units by mouth daily.             BP 172/128  Pulse 93  Temp(Src) 98.3 F (36.8 C) (Oral)  Resp 16  SpO2 98%  LMP 03/28/2012  Physical Exam  Nursing note and vitals reviewed. Constitutional: She appears well-developed and well-nourished.  HENT:  Head: Normocephalic and atraumatic.  Right Ear: External ear normal.  Left Ear: External ear normal.  Nose: Nose normal.  Eyes: Conjunctivae and EOM are normal.  Neck: Neck supple. No tracheal deviation present.  Pulmonary/Chest: Effort normal. No stridor. No respiratory distress.  Musculoskeletal: She exhibits no edema and no tenderness.       Lumbar back: She exhibits decreased range of motion, tenderness, pain and spasm. She exhibits no swelling and no edema.  Neurological: She is alert. She is not disoriented. No cranial nerve deficit or sensory deficit. She exhibits normal muscle tone. Coordination normal.  Skin: Skin is warm and dry. No rash noted. She is not diaphoretic. No erythema.  Psychiatric: She has a normal mood and affect. Her behavior is normal. Thought content normal.    ED Course  Procedures (including critical care time)  Labs Reviewed  URINALYSIS, ROUTINE W REFLEX MICROSCOPIC - Abnormal; Notable for the following:    Specific Gravity, Urine 1.035 (*)    Glucose, UA >1000 (*)    All other components within normal limits  URINE MICROSCOPIC-ADD ON - Abnormal; Notable for the following:    Squamous Epithelial / LPF FEW (*)    All other components within normal limits  POCT PREGNANCY, URINE   No results found.   1. Lumbar  strain       MDM  Lumbar strain No sign of acute neurological or vascular emergency associated with pt's back pain.  Does not appear to have a component of sciatica.  Safe for outpatient follow up.  Hypertension Patient has history of hypertension. She has not taken any of her medications. She does not appear to be having symptoms related to that. Explain the importance of taking her blood pressure medications regularly         Celene Kras, MD 04/18/12 [phone]

## 2012-09-07 LAB — HM DIABETES EYE EXAM

## 2012-10-15 ENCOUNTER — Encounter: Payer: Self-pay | Admitting: Women's Health

## 2012-10-15 ENCOUNTER — Ambulatory Visit (INDEPENDENT_AMBULATORY_CARE_PROVIDER_SITE_OTHER): Payer: BC Managed Care – PPO | Admitting: Women's Health

## 2012-10-15 VITALS — BP 134/90 | Ht 63.0 in | Wt 253.0 lb

## 2012-10-15 DIAGNOSIS — Z01419 Encounter for gynecological examination (general) (routine) without abnormal findings: Secondary | ICD-10-CM

## 2012-10-15 DIAGNOSIS — B379 Candidiasis, unspecified: Secondary | ICD-10-CM

## 2012-10-15 MED ORDER — TERCONAZOLE 0.4 % VA CREA: 1.0000 | TOPICAL_CREAM | Freq: Every day | VAGINAL | Status: DC

## 2012-10-15 NOTE — Progress Notes (Signed)
Victoria Padilla [birth year] [id number]    History:    The patient presents for annual exam.  Monthly cycle BTL/not sexually active. History of cryo- in 1998 with normal Paps after. Normal mammogram 2011. Hypertension/hypercholesterolemia/diabetes/sarcoidosis primary care manages.   Past medical history, past surgical history, family history and social history were all reviewed and documented in the EPIC chart. 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 Parents heart failure.   ROS:  A  ROS was performed and pertinent positives and negatives are included in the history.  Exam:  Filed Vitals:   10/15/12 0923  BP: 134/90    General appearance:  Normal Head/Neck:  Normal, without cervical or supraclavicular adenopathy. Thyroid:  Symmetrical, normal in size, without palpable masses or nodularity. Respiratory  Effort:  Normal  Auscultation:  Clear without wheezing or rhonchi Cardiovascular  Auscultation:  Regular rate, without rubs, murmurs or gallops  Edema/varicosities:  Not grossly evident Abdominal  Soft,nontender, without masses, guarding or rebound.  Liver/spleen:  No organomegaly noted  Hernia:  None appreciated  Skin  Inspection:  Grossly normal  Palpation:  Grossly normal Neurologic/psychiatric  Orientation:  Normal with appropriate conversation.  Mood/affect:  Normal  Genitourinary    Breasts: Examined lying and sitting.     Right: Without masses, retractions, discharge or axillary adenopathy.     Left: Without masses, retractions, discharge or axillary adenopathy.   Inguinal/mons:  Normal without inguinal adenopathy  External genitalia:  Normal  BUS/Urethra/Skene's glands:  Normal  Bladder:  Normal  Vagina:  Normal/good support  Cervix:  Normal  Uterus:   normal in size, shape and contour.  Midline and mobile  Adnexa/parametria:      Rt: Without masses or tenderness.   Lt: Without masses or tenderness.  Anus and perineum: Normal  Digital rectal exam: Normal sphincter tone without palpated masses or tenderness  Assessment/Plan:  47 y.o. DBF G3P3 for annual exam.     Stress incontinence Cryo- 1998 normal Paps after Hypertension/diabetes/hypercholesteremia/sarcoidosis-primary care labs and meds BTL obesity  Plan: SBE's, instructed to schedule mammogram reviewed importance of an annual screen. Reviewed importance of increasing regular exercise and decreasing calories for weight loss. Terazol 7 to use as needed for external vaginal itching. Denies any discharge or symptoms today. Diabetes control with yeast symptoms reviewed, instructed to schedule followup with primary care for management. Condoms encouraged if become sexually active. Pap normal 2013, new screening guidelines reviewed.    Harrington Challenger [facility], 9:56 AM 10/15/2012

## 2012-10-15 NOTE — Patient Instructions (Addendum)
schedule mammogram and appointment at [facility]!!! 1500 Calorie Diabetic Diet The 1500 calorie diabetic diet limits calories to 1500 each day. Following this diet and making healthy meal choices can help improve overall health. It controls blood glucose (sugar) levels and can also help lower blood pressure and cholesterol.  SERVING SIZES Measuring foods and serving sizes helps to make sure you are getting the right amount of food. The list below tells how big or small some common serving sizes are.  1 oz.........4 stacked dice.  3 oz........Marland KitchenDeck of cards.  1 tsp.......Marland KitchenTip of little finger.  1 tbs......Marland KitchenMarland KitchenThumb.  2 tbs.......Marland KitchenGolf ball.   cup......Marland KitchenHalf of a fist.  1 cup.......Marland KitchenA fist. GUIDELINES FOR CHOOSING FOODS The goal of this diet is to eat a variety of foods and limit calories to 1500 each day. This can be done by choosing foods that are low in calories and fat. The diet also suggests eating small amounts of food frequently. Doing this helps control your blood glucose levels, so they do not get too high or too low. Each meal or snack may include a protein food source to help you feel more satisfied. Try to eat about the same amount of food around the same time each day. This includes weekend days, travel days, and days off work. Space your meals about 4 to 5 hours apart, and add a snack between them, if you wish.  For example, a daily food plan could include breakfast, a morning snack, lunch, dinner, and an evening snack. Healthy meals and snacks have different types of foods, including whole grains, vegetables, fruits, lean meats, poultry, fish, and dairy products. As you plan your meals, select a variety of foods. Choose from the bread and starch, vegetable, fruit, dairy, and meat/protein groups. Examples of foods from each group are listed below, with their suggested serving sizes. Use measuring cups and spoons to become familiar with what a healthy portion looks like. Bread  and Starch Each serving equals 15 grams of carbohydrate.  1 slice bread.   bagel.   cup cold cereal (unsweetened).   cup hot cereal or mashed potatoes.  1 small potato (size of a computer mouse).   cup cooked pasta or rice.   English muffin.  1 cup broth-based soup.  3 cups of popcorn.  4 to 6 whole-wheat crackers.   cup cooked beans, peas, or corn. Vegetables Each serving equals 5 grams of carbohydrate.   cup cooked vegetables.  1 cup raw vegetables.   cup tomato or vegetable juice. Fruit Each serving equals 15 grams of carbohydrate.  1 small apple or orange.  1  cup watermelon or strawberries.   cup applesauce (no sugar added).  2 tbs raisins.   banana.   cup canned fruit, packed in water or in its own juice.   cup unsweetened fruit juice. Dairy Each serving equals 12 to 15 grams of carbohydrate.  1 cup fat-free milk.  6 oz artificially sweetened yogurt or plain yogurt.  1 cup low-fat buttermilk.  1 cup soy milk.  1 cup almond milk. Meat/Protein  1 large egg.  2 to 3 oz meat, poultry, or fish.   cup low-fat cottage cheese.  1 tbs peanut butter.  1 oz low-fat cheese.   cup tuna, packed in water.   cup tofu. Fat  1 tsp oil.  1 tsp trans-fat-free margarine.  1 tsp butter.  1 tsp mayonnaise.  2 tbs avocado.  1 tbs salad dressing.  1 tbs cream cheese.  2 tbs sour cream. SAMPLE 1500 CALORIE DIET PLAN Breakfast   whole-wheat English muffin (1 carb serving).  1 tsp trans-fat-free margarine.  1 scrambled egg.  1 cup fat-free milk (1 carb serving).  1 small orange (1 carb serving). Lunch  Chicken wrap.  1 whole-wheat tortilla, 8-inch (1 carb servings).  2 oz chicken breast, sliced.  2 tbs low-fat salad dressing, such as [place].   cup shredded lettuce.  2 slices tomato.   cup carrot sticks.  1 small apple (1 carb serving). Afternoon Snack  3 graham cracker squares (1 carb  serving).  1 tbs peanut butter. Dinner  2 oz lean pork chop, broiled.  1 cup brown rice (3 carb servings).   cup steamed carrots.   cup green beans.  1 cup fat-free milk (1 carb serving).  1 tsp trans-fat-free margarine. Evening Snack   cup low-fat cottage cheese.  1 small peach or pear, sliced (or  cup canned in water) (1 carb serving). MEAL PLAN You can use this worksheet to help you make a daily meal plan based on the 1500 calorie diabetic diet suggestions. If you are using this plan to help you control your blood glucose, you may interchange carbohydrate containing foods (dairy, starches, and fruits). Select a variety of fresh foods of varying colors and flavors. The total amount of carbohydrate in your meals or snacks is more important than making sure you include all of the food groups every time you eat. You can choose from approximately this many of the following foods to build your day's meals:  6 Starches.  3 Vegetables.  2 Fruits.  2 Dairy.  4 to 6 oz Meat/Protein.  Up to 3 Fats. Your dietician can use this worksheet to help you decide how many servings and which types of foods are right for you. BREAKFAST Food Group and Servings / Food Choice Starch _________________________________________________________ Dairy __________________________________________________________ Fruit ___________________________________________________________ Meat/Protein____________________________________________________ Fat ____________________________________________________________ LUNCH Food Group and Servings / Food Choice  Starch _________________________________________________________ Meat/Protein ___________________________________________________ Vegetables _____________________________________________________ Fruit __________________________________________________________ Dairy __________________________________________________________ Fat  ____________________________________________________________ Aura Fey Food Group and Servings / Food Choice Dairy __________________________________________________________ Starch _________________________________________________________ Meat/Protein____________________________________________________ Zada Girt ___________________________________________________________ Laural Golden Food Group and Servings / Food Choice Starch _________________________________________________________ Meat/Protein ___________________________________________________ Dairy __________________________________________________________ Vegetable ______________________________________________________ Fruit ___________________________________________________________ Fat ____________________________________________________________ Lollie Sails Food Group and Servings / Food Choice Fruit ___________________________________________________________ Meat/Protein ____________________________________________________ Dairy __________________________________________________________ Starch __________________________________________________________ DAILY TOTALS Starches _________________________ Vegetables _______________________ Fruits ____________________________ Dairy ____________________________ Meat/Protein_____________________ Fats _____________________________ Document Released: 09/03/2004 Document Revised: 05/06/2011 Document Reviewed: 12/29/2008 Exit[employer] Patient Information 2014 [employer].

## 2012-10-16 LAB — URINALYSIS W MICROSCOPIC + REFLEX CULTURE
Casts: NONE SEEN
Crystals: NONE SEEN
Ketones, ur: NEGATIVE mg/dL
Leukocytes, UA: NEGATIVE
Nitrite: NEGATIVE
Specific Gravity, Urine: 1.018 (ref 1.005–1.030)
pH: 5.5 (ref 5.0–8.0)

## 2012-12-13 ENCOUNTER — Encounter: Payer: Self-pay | Admitting: Family Medicine

## 2013-02-01 ENCOUNTER — Ambulatory Visit (INDEPENDENT_AMBULATORY_CARE_PROVIDER_SITE_OTHER): Payer: BC Managed Care – PPO | Admitting: Family Medicine

## 2013-02-01 ENCOUNTER — Encounter: Payer: Self-pay | Admitting: Family Medicine

## 2013-02-01 VITALS — BP 150/100 | HR 86 | Temp 97.5°F | Resp 18 | Ht 63.0 in | Wt 251.0 lb

## 2013-02-01 DIAGNOSIS — I10 Essential (primary) hypertension: Secondary | ICD-10-CM

## 2013-02-01 DIAGNOSIS — IMO0001 Reserved for inherently not codable concepts without codable children: Secondary | ICD-10-CM

## 2013-02-01 DIAGNOSIS — Z23 Encounter for immunization: Secondary | ICD-10-CM

## 2013-02-01 DIAGNOSIS — E785 Hyperlipidemia, unspecified: Secondary | ICD-10-CM

## 2013-02-01 LAB — COMPLETE METABOLIC PANEL WITH GFR
BUN: 11 mg/dL (ref 6–23)
CO2: 25 mEq/L (ref 19–32)
Calcium: 9.6 mg/dL (ref 8.4–10.5)
Chloride: 101 mEq/L (ref 96–112)
Creat: 0.72 mg/dL (ref 0.50–1.10)
GFR, Est African American: >89 mL/min
GFR, Est Non African American: >89 mL/min
Glucose, Bld: 264 mg/dL — ABNORMAL HIGH (ref 70–99)
Total Bilirubin: 0.3 mg/dL (ref 0.3–1.2)

## 2013-02-01 LAB — MICROALBUMIN, URINE: Microalb, Ur: 12.1 mg/dL — ABNORMAL HIGH (ref 0.00–1.89)

## 2013-02-01 LAB — HEMOGLOBIN A1C: Mean Plasma Glucose: 312 mg/dL — ABNORMAL HIGH (ref <117)

## 2013-02-01 LAB — LIPID PANEL: Cholesterol: 190 mg/dL (ref 0–200)

## 2013-02-01 MED ORDER — BENAZEPRIL HCL 20 MG PO TABS: 20.0000 mg | ORAL_TABLET | Freq: Every day | ORAL | Status: DC

## 2013-02-01 MED ORDER — PRAVASTATIN SODIUM 40 MG PO TABS: 40.0000 mg | ORAL_TABLET | Freq: Every day | ORAL | Status: DC

## 2013-02-01 MED ORDER — FUROSEMIDE 20 MG PO TABS: 20.0000 mg | ORAL_TABLET | Freq: Every day | ORAL | Status: DC

## 2013-02-01 MED ORDER — AMLODIPINE BESYLATE 10 MG PO TABS: 10.0000 mg | ORAL_TABLET | Freq: Every day | ORAL | Status: DC

## 2013-02-01 MED ORDER — INSULIN GLARGINE 100 UNIT/ML SOLOSTAR PEN: 25.0000 [IU] | PEN_INJECTOR | Freq: Every day | SUBCUTANEOUS | Status: DC

## 2013-02-01 MED ORDER — HYDROCHLOROTHIAZIDE 25 MG PO TABS: 25.0000 mg | ORAL_TABLET | Freq: Every day | ORAL | Status: DC

## 2013-02-01 MED ORDER — METFORMIN HCL 1000 MG PO TABS: 1000.0000 mg | ORAL_TABLET | Freq: Two times a day (BID) | ORAL | Status: DC

## 2013-02-01 MED ORDER — PANTOPRAZOLE SODIUM 40 MG PO TBEC: 40.0000 mg | DELAYED_RELEASE_TABLET | Freq: Every day | ORAL | Status: DC

## 2013-02-01 MED ORDER — FLUTICASONE-SALMETEROL 100-50 MCG/DOSE IN AEPB: 1.0000 | INHALATION_SPRAY | Freq: Two times a day (BID) | RESPIRATORY_TRACT | Status: DC

## 2013-02-01 NOTE — Progress Notes (Signed)
Subjective:    Patient ID: Victoria Padilla, female    DOB: [birth year], 47 y.o.   MRN: [id number]  HPI  Patient is here today for a followup of type 2 diabetes mellitus, hypertension, and hyperlipidemia. She's been off her pre-blood pressure medications now for quite some time. She denies any chest pain shortness of breath or at dyspnea on exertion.  She is currently taking Lantus 25 units subcutaneous daily. She is not checking her blood sugars at all. She denies any hypoglycemia. She denies any polyuria polydipsia or blurred vision. She denies any symptoms of peripheral neuropathy. She's not having any right upper quadrant pain or myalgias.  She is also not been compliant with her cholesterol medication. She has not had a flu shot. She is taking an aspirin everyday. Past Medical History  Diagnosis Date  . Arthritis   . Hypertension   . Sarcoidosis   . LBP (low back pain)   . Hyperlipidemia   . Vitamin D deficiency   . Obesity   . Cervical dysplasia   . Diabetes mellitus   . Acid reflux    Current Outpatient Prescriptions on File Prior to Visit  Medication Sig Dispense Refill  . albuterol (PROVENTIL) (2.5 MG/3ML) 0.083% nebulizer solution Take 2.5 mg by nebulization every 6 (six) hours as needed.        Marland Kitchen amLODipine (NORVASC) 10 MG tablet Take 10 mg by mouth daily.        . benazepril (LOTENSIN) 20 MG tablet Take 20 mg by mouth daily.        . Cholecalciferol (VITAMIN D) 2000 UNITS CAPS Take by mouth.        . cyclobenzaprine (FLEXERIL) 5 MG tablet Take 1 tablet (5 mg total) by mouth 3 (three) times daily as needed for muscle spasms.  21 tablet  0  . fexofenadine (ALLEGRA) 180 MG tablet Take 180 mg by mouth daily.        . Fluticasone-Salmeterol (ADVAIR DISKUS) 100-50 MCG/DOSE AEPB Inhale 1 puff into the lungs every 12 (twelve) hours.        . furosemide (LASIX) 20 MG tablet Take 20 mg by mouth daily.        . hydrochlorothiazide 25 MG tablet Take 25 mg by mouth daily.        Marland Kitchen  HYDROcodone-acetaminophen (NORCO) 5-325 MG per tablet Take 1-2 tablets by mouth every 6 (six) hours as needed for pain.  16 tablet  0  . IRON PO Take by mouth.      . metFORMIN (GLUCOPHAGE) 1000 MG tablet Take 1,000 mg by mouth 2 (two) times daily with a meal.        . Omega-3 Fatty Acids (FISH OIL PO) Take by mouth.      . pantoprazole (PROTONIX) 40 MG tablet Take 40 mg by mouth daily.        . pravastatin (PRAVACHOL) 40 MG tablet Take 40 mg by mouth daily.        . vitamin E 100 UNIT capsule Take 100 Units by mouth daily.        . diclofenac (VOLTAREN) 50 MG EC tablet Take 50 mg by mouth 2 (two) times daily.         No current facility-administered medications on file prior to visit.   Allergies  Allergen Reactions  . Aspirin Nausea Only  . Naprosyn [Naproxen]    History   Social History  . Marital Status: Single    Spouse Name: N/A  Number of Children: N/A  . Years of Education: N/A   Occupational History  . Not on file.   Social History Main Topics  . Smoking status: Never Smoker   . Smokeless tobacco: Not on file  . Alcohol Use: No  . Drug Use: No  . Sexual Activity: No   Other Topics Concern  . Not on file   Social History Narrative  . No narrative on file     Review of Systems  All other systems reviewed and are negative.       Objective:   Physical Exam  Vitals reviewed. Constitutional: She is oriented to person, place, and time. She appears well-developed and well-nourished.  HENT:  Mouth/Throat: No oropharyngeal exudate.  Eyes: Conjunctivae and EOM are normal. Pupils are equal, round, and reactive to light. No scleral icterus.  Neck: Neck supple. No JVD present. No thyromegaly present.  Cardiovascular: Normal rate, regular rhythm, normal heart sounds and intact distal pulses.  Exam reveals no gallop and no friction rub.   No murmur heard. Pulmonary/Chest: Effort normal and breath sounds normal. No respiratory distress. She has no wheezes. She has  no rales. She exhibits no tenderness.  Abdominal: Soft. Bowel sounds are normal. She exhibits no distension and no mass. There is no tenderness. There is no rebound and no guarding.  Musculoskeletal: She exhibits edema.  Lymphadenopathy:    She has no cervical adenopathy.  Neurological: She is alert and oriented to person, place, and time. No cranial nerve deficit. Coordination normal.  Psychiatric: She has a normal mood and affect. Her behavior is normal. Judgment and thought content normal.          Assessment & Plan:  1. Type II or unspecified type diabetes mellitus without mention of complication, uncontrolled I will give the patient her flu shot today. I recommended compliance. I recommended she check her fasting blood sugar every morning and her two-hour postprandial sugars after supper. Her goal fasting blood sugar is less than 130. Her goal two-hour postprandial sugar is less than 160. I asked that she return these values to me in 2 weeks and I can further titrate her insulin. Also recommended annual eye exam. Particular given her sarcoidosis - COMPLETE METABOLIC PANEL WITH GFR - Hemoglobin A1c - Lipid panel - Microalbumin, urine  2. HTN (hypertension) Blood pressures out of control. Past patient resume her amlodipine, benazepril, and hydrochlorothiazide. Recheck blood pressure month.  3. HLD (hyperlipidemia) Check fasting lipid panel. Goal LDL is less than 100. - Lipid panel

## 2013-02-22 ENCOUNTER — Encounter: Payer: Self-pay | Admitting: Family Medicine

## 2013-02-22 ENCOUNTER — Ambulatory Visit (INDEPENDENT_AMBULATORY_CARE_PROVIDER_SITE_OTHER): Payer: BC Managed Care – PPO | Admitting: Family Medicine

## 2013-02-22 ENCOUNTER — Telehealth: Payer: Self-pay | Admitting: Family Medicine

## 2013-02-22 VITALS — BP 126/72 | HR 72 | Temp 98.4°F | Resp 18 | Ht 63.0 in | Wt 250.0 lb

## 2013-02-22 DIAGNOSIS — IMO0001 Reserved for inherently not codable concepts without codable children: Secondary | ICD-10-CM

## 2013-02-22 DIAGNOSIS — R5381 Other malaise: Secondary | ICD-10-CM

## 2013-02-22 DIAGNOSIS — E785 Hyperlipidemia, unspecified: Secondary | ICD-10-CM

## 2013-02-22 NOTE — Progress Notes (Signed)
Subjective:    Patient ID: Victoria Padilla, female    DOB: 06-04-1965, 47 y.o.   MRN: [id number]  HPI Please see the patient's previous office visit. She was found to have a hemoglobin A1c of 12.5 showing that her sugars are out of control. I asked the patient to increase her Lantus from 25 units a day to 45 units a day and then increase her Lantus 1 unit every day until fasting blood sugars are less than 130. She is here today for a recheck. Of note her LDL cholesterol was also elevated at 115. Given her diabetes her goal LDL cholesterol should be less than 100. She is currently taking pravastatin 40 mg by mouth daily.    The patient independently increased her Lantus to 70 units subcutaneous daily. For showing her blood sugars range 70 to 1:30 in the mornings and well controlled he is her two-hour postprandial sugars range 101-168. She had 2 episodes where they were greater than 200. Each of these episodes occurred after dietary indiscretion, either cookies or Christmas dinner.  She has had no hypoglycemic episodes. Past Medical History  Diagnosis Date  . Arthritis   . Hypertension   . Sarcoidosis   . LBP (low back pain)   . Hyperlipidemia   . Vitamin D deficiency   . Obesity   . Cervical dysplasia   . Diabetes mellitus   . Acid reflux    Current Outpatient Prescriptions on File Prior to Visit  Medication Sig Dispense Refill  . albuterol (PROVENTIL) (2.5 MG/3ML) 0.083% nebulizer solution Take 2.5 mg by nebulization every 6 (six) hours as needed.        Marland Kitchen amLODipine (NORVASC) 10 MG tablet Take 1 tablet (10 mg total) by mouth daily.  90 tablet  4  . benazepril (LOTENSIN) 20 MG tablet Take 1 tablet (20 mg total) by mouth daily.  90 tablet  4  . Cholecalciferol (VITAMIN D) 2000 UNITS CAPS Take by mouth.        . cyclobenzaprine (FLEXERIL) 5 MG tablet Take 1 tablet (5 mg total) by mouth 3 (three) times daily as needed for muscle spasms.  21 tablet  0  . diclofenac (VOLTAREN) 50 MG EC  tablet Take 50 mg by mouth 2 (two) times daily.        . fexofenadine (ALLEGRA) 180 MG tablet Take 180 mg by mouth daily.        . Fluticasone-Salmeterol (ADVAIR DISKUS) 100-50 MCG/DOSE AEPB Inhale 1 puff into the lungs every 12 (twelve) hours.  180 each  4  . furosemide (LASIX) 20 MG tablet Take 1 tablet (20 mg total) by mouth daily.  90 tablet  4  . hydrochlorothiazide (HYDRODIURIL) 25 MG tablet Take 1 tablet (25 mg total) by mouth daily.  90 tablet  4  . HYDROcodone-acetaminophen (NORCO) 5-325 MG per tablet Take 1-2 tablets by mouth every 6 (six) hours as needed for pain.  16 tablet  0  . IRON PO Take by mouth.      . metFORMIN (GLUCOPHAGE) 1000 MG tablet Take 1 tablet (1,000 mg total) by mouth 2 (two) times daily with a meal.  180 tablet  4  . Omega-3 Fatty Acids (FISH OIL PO) Take by mouth.      . pantoprazole (PROTONIX) 40 MG tablet Take 1 tablet (40 mg total) by mouth daily.  90 tablet  4  . pravastatin (PRAVACHOL) 40 MG tablet Take 1 tablet (40 mg total) by mouth daily.  90 tablet  4  . vitamin E 100 UNIT capsule Take 100 Units by mouth daily.         No current facility-administered medications on file prior to visit.   Allergies  Allergen Reactions  . Aspirin Nausea Only  . Naprosyn [Naproxen]    History   Social History  . Marital Status: Single    Spouse Name: N/A    Number of Children: N/A  . Years of Education: N/A   Occupational History  . Not on file.   Social History Main Topics  . Smoking status: Never Smoker   . Smokeless tobacco: Not on file  . Alcohol Use: No  . Drug Use: No  . Sexual Activity: No   Other Topics Concern  . Not on file   Social History Narrative  . No narrative on file      Review of Systems  All other systems reviewed and are negative.       Objective:   Physical Exam  Vitals reviewed. Cardiovascular: Normal rate, regular rhythm and normal heart sounds.   Pulmonary/Chest: Effort normal and breath sounds normal.           Assessment & Plan:  1. Type II or unspecified type diabetes mellitus without mention of complication, uncontrolled Continue Lantus 70 units subcutaneous daily. Continue to monitor two-hour postprandial sugars. If they rise greater than 160 I would add Januvia 100 mg by mouth daily. 2. HLD (hyperlipidemia) I discussed switching pravastatin Lipitor 40 mg by mouth daily. The patient would like to try diet exercise and weight loss to try to drop her LDL less than 100 first. Therefore, we will recheck her hemoglobin A1c and lipid panel in 3 months.

## 2013-02-22 NOTE — Telephone Encounter (Signed)
Pt was seen earlier but she is feeling really tired she has been taking iron and b12 she wants some advice Call back number is [phone]

## 2013-02-23 NOTE — Telephone Encounter (Signed)
..  Patient aware per vm 

## 2013-02-23 NOTE — Telephone Encounter (Signed)
She could come by for a b12, iron level, and tsh.  Otherwise, the fatigue may be due to the fact that her sugars have been so out of control for so long.

## 2013-02-24 ENCOUNTER — Telehealth: Payer: Self-pay | Admitting: Family Medicine

## 2013-02-24 NOTE — Telephone Encounter (Signed)
Pt is returning call from yesterday Call back number is [phone]

## 2013-02-24 NOTE — Telephone Encounter (Signed)
LMTRC

## 2013-06-07 ENCOUNTER — Ambulatory Visit: Payer: BC Managed Care – PPO | Admitting: Family Medicine

## 2013-06-21 ENCOUNTER — Encounter: Payer: Self-pay | Admitting: Physician Assistant

## 2013-06-21 ENCOUNTER — Ambulatory Visit (INDEPENDENT_AMBULATORY_CARE_PROVIDER_SITE_OTHER): Payer: BC Managed Care – PPO | Admitting: Physician Assistant

## 2013-06-21 VITALS — BP 144/104 | HR 88 | Temp 98.5°F | Resp 18 | Wt 245.0 lb

## 2013-06-21 DIAGNOSIS — E119 Type 2 diabetes mellitus without complications: Secondary | ICD-10-CM

## 2013-06-21 DIAGNOSIS — L0390 Cellulitis, unspecified: Secondary | ICD-10-CM

## 2013-06-21 DIAGNOSIS — L0291 Cutaneous abscess, unspecified: Secondary | ICD-10-CM

## 2013-06-21 MED ORDER — MINOCYCLINE HCL 100 MG PO CAPS: 100.0000 mg | ORAL_CAPSULE | Freq: Two times a day (BID) | ORAL | Status: DC

## 2013-06-21 NOTE — Progress Notes (Signed)
Patient ID: Victoria Padilla MRN: [id number], DOB: 1966/02/02, 48 y.o. Date of Encounter: 06/21/2013, 1:47 PM    Chief Complaint:  Chief Complaint  Patient presents with  . gets freq boils under both arms    come/go switch sides     HPI: 48 y.o. year old Victoria Padilla female reports that over the past several months she keeps getting boils/abscesses in her armpit areas. As soon as one seems to be resolving another one pops up. She feels knots under the skin that are tender and painful.  States that she has not been receiving these in any other areas except for her axilla. No fevers or chills.     Home Meds: See attached medication section for any medications that were entered at today's visit. The computer does not put those onto this list.The following list is a list of meds entered prior to today's visit.   Current Outpatient Prescriptions on File Prior to Visit  Medication Sig Dispense Refill  . albuterol (PROVENTIL) (2.5 MG/3ML) 0.083% nebulizer solution Take 2.5 mg by nebulization every 6 (six) hours as needed.        Marland Kitchen amLODipine (NORVASC) 10 MG tablet Take 1 tablet (10 mg total) by mouth daily.  90 tablet  4  . benazepril (LOTENSIN) 20 MG tablet Take 1 tablet (20 mg total) by mouth daily.  90 tablet  4  . Cholecalciferol (VITAMIN D) 2000 UNITS CAPS Take by mouth.        . cyclobenzaprine (FLEXERIL) 5 MG tablet Take 1 tablet (5 mg total) by mouth 3 (three) times daily as needed for muscle spasms.  21 tablet  0  . fexofenadine (ALLEGRA) 180 MG tablet Take 180 mg by mouth daily.        . furosemide (LASIX) 20 MG tablet Take 1 tablet (20 mg total) by mouth daily.  90 tablet  4  . hydrochlorothiazide (HYDRODIURIL) 25 MG tablet Take 1 tablet (25 mg total) by mouth daily.  90 tablet  4  . Insulin Glargine 100 UNIT/ML SOPN Inject 70 Units into the skin daily.      . IRON PO Take by mouth.      . metFORMIN (GLUCOPHAGE) 1000 MG tablet Take 1 tablet (1,000 mg total) by mouth 2 (two) times daily  with a meal.  180 tablet  4  . Omega-3 Fatty Acids (FISH OIL PO) Take by mouth.      . pantoprazole (PROTONIX) 40 MG tablet Take 1 tablet (40 mg total) by mouth daily.  90 tablet  4  . pravastatin (PRAVACHOL) 40 MG tablet Take 1 tablet (40 mg total) by mouth daily.  90 tablet  4  . vitamin E 100 UNIT capsule Take 100 Units by mouth daily.        . diclofenac (VOLTAREN) 50 MG EC tablet Take 50 mg by mouth 2 (two) times daily.        Marland Kitchen HYDROcodone-acetaminophen (NORCO) 5-325 MG per tablet Take 1-2 tablets by mouth every 6 (six) hours as needed for pain.  16 tablet  0   No current facility-administered medications on file prior to visit.    Allergies:  Allergies  Allergen Reactions  . Aspirin Nausea Only  . Naprosyn [Naproxen]       Review of Systems: See HPI for pertinent ROS. All other ROS negative.    Physical Exam: Blood pressure 144/104, pulse 88, temperature 98.5 F (36.9 C), temperature source Oral, resp. rate 18, weight 245 lb (111.131 kg).,  Body mass index is 43.41 kg/(m^2). General:  Obese AAF. Appears in no acute distress. Neck: Supple. No thyromegaly. No lymphadenopathy. Lungs: Clear bilaterally to auscultation without wheezes, rales, or rhonchi. Breathing is unlabored. Heart: Regular rhythm. No murmurs, rubs, or gallops. Msk:  Strength and tone normal for age. Extremities/Skin: Warm and dry. She has about 4  lesions under each axilla. Each of these is approximate 1 cm nodule under the skin. There is no erythema. Even with palpation of these sites they do not drain and are not fluctuant. The skin is hyperpigmented at the sites of skin covering these nodules. Neuro: Alert and oriented X 3. Moves all extremities spontaneously. Gait is normal. CNII-XII grossly in tact. Psych:  Responds to questions appropriately with a normal affect.     ASSESSMENT AND PLAN:  48 y.o. year old female with  1. Cellulitis and abscess - minocycline (MINOCIN) 100 MG capsule; Take 1 capsule  (100 mg total) by mouth 2 (two) times daily.  Dispense: 60 capsule; Refill: 2  2. Diabetes  She is to start start taking minocycline twice a day. I told her that if these are not improving and resolving over the next couple weeks to followup.   [address] [place], [place], [facility] 06/21/2013 1:47 PM

## 2013-08-09 ENCOUNTER — Encounter: Payer: BC Managed Care – PPO | Admitting: Physician Assistant

## 2013-08-30 ENCOUNTER — Encounter: Payer: BC Managed Care – PPO | Admitting: Physician Assistant

## 2013-09-11 ENCOUNTER — Emergency Department (HOSPITAL_COMMUNITY): Payer: 59

## 2013-09-11 ENCOUNTER — Encounter (HOSPITAL_COMMUNITY): Payer: Self-pay | Admitting: Emergency Medicine

## 2013-09-11 ENCOUNTER — Emergency Department (HOSPITAL_COMMUNITY)
Admission: EM | Admit: 2013-09-11 | Discharge: 2013-09-11 | Disposition: A | Discharge status: Home / Self Care | Payer: 59 | Attending: Emergency Medicine | Admitting: Emergency Medicine

## 2013-09-11 DIAGNOSIS — M129 Arthropathy, unspecified: Secondary | ICD-10-CM | POA: Insufficient documentation

## 2013-09-11 DIAGNOSIS — R7401 Elevation of levels of liver transaminase levels: Secondary | ICD-10-CM | POA: Insufficient documentation

## 2013-09-11 DIAGNOSIS — E119 Type 2 diabetes mellitus without complications: Secondary | ICD-10-CM | POA: Insufficient documentation

## 2013-09-11 DIAGNOSIS — Z9114 Patient's other noncompliance with medication regimen: Secondary | ICD-10-CM

## 2013-09-11 DIAGNOSIS — R079 Chest pain, unspecified: Secondary | ICD-10-CM

## 2013-09-11 DIAGNOSIS — R7402 Elevation of levels of lactic acid dehydrogenase (LDH): Secondary | ICD-10-CM | POA: Insufficient documentation

## 2013-09-11 DIAGNOSIS — Z792 Long term (current) use of antibiotics: Secondary | ICD-10-CM | POA: Insufficient documentation

## 2013-09-11 DIAGNOSIS — I10 Essential (primary) hypertension: Secondary | ICD-10-CM

## 2013-09-11 DIAGNOSIS — E785 Hyperlipidemia, unspecified: Secondary | ICD-10-CM | POA: Insufficient documentation

## 2013-09-11 DIAGNOSIS — Z791 Long term (current) use of non-steroidal anti-inflammatories (NSAID): Secondary | ICD-10-CM | POA: Insufficient documentation

## 2013-09-11 DIAGNOSIS — R740 Nonspecific elevation of levels of transaminase and lactic acid dehydrogenase [LDH]: Secondary | ICD-10-CM

## 2013-09-11 DIAGNOSIS — G8929 Other chronic pain: Secondary | ICD-10-CM | POA: Insufficient documentation

## 2013-09-11 DIAGNOSIS — Z79899 Other long term (current) drug therapy: Secondary | ICD-10-CM | POA: Insufficient documentation

## 2013-09-11 DIAGNOSIS — K219 Gastro-esophageal reflux disease without esophagitis: Secondary | ICD-10-CM | POA: Insufficient documentation

## 2013-09-11 DIAGNOSIS — Z794 Long term (current) use of insulin: Secondary | ICD-10-CM | POA: Insufficient documentation

## 2013-09-11 DIAGNOSIS — Z91199 Patient's noncompliance with other medical treatment and regimen due to unspecified reason: Secondary | ICD-10-CM | POA: Insufficient documentation

## 2013-09-11 DIAGNOSIS — Z8742 Personal history of other diseases of the female genital tract: Secondary | ICD-10-CM | POA: Insufficient documentation

## 2013-09-11 DIAGNOSIS — Z9119 Patient's noncompliance with other medical treatment and regimen: Secondary | ICD-10-CM | POA: Insufficient documentation

## 2013-09-11 DIAGNOSIS — Z8619 Personal history of other infectious and parasitic diseases: Secondary | ICD-10-CM | POA: Insufficient documentation

## 2013-09-11 DIAGNOSIS — E669 Obesity, unspecified: Secondary | ICD-10-CM | POA: Insufficient documentation

## 2013-09-11 LAB — I-STAT TROPONIN, ED
TROPONIN I, POC: 0 ng/mL (ref 0.00–0.08)
Troponin i, poc: 0 ng/mL (ref 0.00–0.08)

## 2013-09-11 LAB — URINALYSIS, ROUTINE W REFLEX MICROSCOPIC
Bilirubin Urine: NEGATIVE
GLUCOSE, UA: NEGATIVE mg/dL
HGB URINE DIPSTICK: NEGATIVE
Ketones, ur: NEGATIVE mg/dL
Nitrite: NEGATIVE
Protein, ur: NEGATIVE mg/dL
SPECIFIC GRAVITY, URINE: 1.02 (ref 1.005–1.030)
Urobilinogen, UA: 1 mg/dL (ref 0.0–1.0)
pH: 8 (ref 5.0–8.0)

## 2013-09-11 LAB — CBC
HCT: 39.2 % (ref 36.0–46.0)
HEMOGLOBIN: 12.3 g/dL (ref 12.0–15.0)
MCH: 22.7 pg — AB (ref 26.0–34.0)
MCHC: 31.4 g/dL (ref 30.0–36.0)
MCV: 72.2 fL — ABNORMAL LOW (ref 78.0–100.0)
Platelets: 242 10*3/uL (ref 150–400)
RBC: 5.43 MIL/uL — ABNORMAL HIGH (ref 3.87–5.11)
RDW: 15.1 % (ref 11.5–15.5)
WBC: 7.4 10*3/uL (ref 4.0–10.5)

## 2013-09-11 LAB — COMPREHENSIVE METABOLIC PANEL
ALT: 53 U/L — AB (ref 0–35)
ANION GAP: 15 (ref 5–15)
AST: 44 U/L — ABNORMAL HIGH (ref 0–37)
Albumin: 3.4 g/dL — ABNORMAL LOW (ref 3.5–5.2)
Alkaline Phosphatase: 144 U/L — ABNORMAL HIGH (ref 39–117)
BUN: 9 mg/dL (ref 6–23)
CALCIUM: 9.3 mg/dL (ref 8.4–10.5)
CO2: 25 mEq/L (ref 19–32)
Chloride: 97 mEq/L (ref 96–112)
Creatinine, Ser: 0.53 mg/dL (ref 0.50–1.10)
GFR calc non Af Amer: >90 mL/min (ref >90)
GLUCOSE: 208 mg/dL — AB (ref 70–99)
Potassium: 4.6 mEq/L (ref 3.7–5.3)
SODIUM: 137 meq/L (ref 137–147)
TOTAL PROTEIN: 7.9 g/dL (ref 6.0–8.3)
Total Bilirubin: 0.2 mg/dL — ABNORMAL LOW (ref 0.3–1.2)

## 2013-09-11 LAB — LIPASE, BLOOD: Lipase: 37 U/L (ref 11–59)

## 2013-09-11 LAB — URINE MICROSCOPIC-ADD ON

## 2013-09-11 MED ORDER — ACETAMINOPHEN 500 MG PO TABS
1000.0000 mg | ORAL_TABLET | Freq: Once | ORAL | Status: AC
  Administered 2013-09-11: 1000 mg via ORAL
  Filled 2013-09-11: qty 2

## 2013-09-11 MED ORDER — ASPIRIN 81 MG PO CHEW: 324.0000 mg | CHEWABLE_TABLET | Freq: Once | ORAL | Status: DC

## 2013-09-11 MED ORDER — ONDANSETRON HCL 4 MG/2ML IJ SOLN: 4.0000 mg | Freq: Once | INTRAMUSCULAR | Status: DC

## 2013-09-11 NOTE — ED Notes (Signed)
PT comfortable with discharge and follow up instructions. No prescriptions. Pt declines wheelchair, escorted to waiting area.

## 2013-09-11 NOTE — ED Notes (Signed)
Pt reports onset this am 0715 of mid sharp chest pains and mild sob. Denies cough. Denies pain increasing with movement or breathing. ekg done. Airway intact.

## 2013-09-11 NOTE — Discharge Instructions (Signed)
Your caregiver has diagnosed you as having chest pain that is not specific for one problem, but does not require admission.  You are at low risk for an acute heart condition or other serious illness. Chest pain comes from many different causes.  SEEK IMMEDIATE MEDICAL ATTENTION IF: You have severe chest pain, especially if the pain is crushing or pressure-like and spreads to the arms, back, neck, or jaw, or if you have sweating, nausea (feeling sick to your stomach), or shortness of breath. THIS IS AN EMERGENCY. Don't wait to see if the pain will go away. Get medical help at once. Call 911 or 0 (operator). DO NOT drive yourself to the hospital.  Your chest pain gets worse and does not go away with rest.  You have an attack of chest pain lasting longer than usual, despite rest and treatment with the medications your caregiver has prescribed.  You wake from sleep with chest pain or shortness of breath.  You feel dizzy or faint.  You have chest pain not typical of your usual pain for which you originally saw your caregiver.  Your liver enzymes were elevated and there were some abnormalities on your Ultrasound. I have provided a report for you to give to your primary care doctor. Please follow up as soon as possible with your primary care doctor. Avoid alcohol and tylenol.  You should also be taking her medications as prescribed. Having difficulty affording her medications please contact your primary care provider for alternative solutions. Did not appear to be having a heart attack or other acute cause of his chest pain at this time.   Chest Pain (Nonspecific) It is often hard to give a diagnosis for the cause of chest pain. There is always a chance that your pain could be related to something serious, such as a heart attack or a blood clot in the lungs. You need to follow up with your doctor. HOME CARE  If antibiotic medicine was given, take it as directed by your doctor. Finish the medicine even if  you start to feel better.  For the next few days, avoid activities that bring on chest pain. Continue physical activities as told by your doctor.  Do not use any tobacco products. This includes cigarettes, chewing tobacco, and e-cigarettes.  Avoid drinking alcohol.  Only take medicine as told by your doctor.  Follow your doctor's suggestions for more testing if your chest pain does not go away.  Keep all doctor visits you made. GET HELP IF:  Your chest pain does not go away, even after treatment.  You have a rash with blisters on your chest.  You have a fever. GET HELP RIGHT AWAY IF:   You have more pain or pain that spreads to your arm, neck, jaw, back, or belly (abdomen).  You have shortness of breath.  You cough more than usual or cough up blood.  You have very bad back or belly pain.  You feel sick to your stomach (nauseous) or throw up (vomit).  You have very bad weakness.  You pass out (faint).  You have chills. This is an emergency. Do not wait to see if the problems will go away. Call your local emergency services (911 in [place] Do not drive yourself to the hospital. MAKE SURE YOU:   Understand these instructions.  Will watch your condition.  Will get help right away if you are not doing well or get worse. Document Released: 07/31/2007 Document Revised: 02/16/2013 Document Reviewed: 07/31/2007 [employer]  Patient Information 2015 [place]. This information is not intended to replace advice given to you by your health care provider. Make sure you discuss any questions you have with your health care provider.  DASH Eating Plan DASH stands for "Dietary Approaches to Stop Hypertension." The DASH eating plan is a healthy eating plan that has been shown to reduce high blood pressure (hypertension). Additional health benefits may include reducing the risk of type 2 diabetes mellitus, heart disease, and stroke. The DASH eating plan may also help with weight  loss. WHAT DO I NEED TO KNOW ABOUT THE DASH EATING PLAN? For the DASH eating plan, you will follow these general guidelines:  Choose foods with a percent daily value for sodium of less than 5% (as listed on the food label).  Use salt-free seasonings or herbs instead of table salt or sea salt.  Check with your health care provider or pharmacist before using salt substitutes.  Eat lower-sodium products, often labeled as "lower sodium" or "no salt added."  Eat fresh foods.  Eat more vegetables, fruits, and low-fat dairy products.  Choose whole grains. Look for the word "whole" as the first word in the ingredient list.  Choose fish and skinless chicken or [place] more often than red meat. Limit fish, poultry, and meat to 6 oz (170 g) each day.  Limit sweets, desserts, sugars, and sugary drinks.  Choose heart-healthy fats.  Limit cheese to 1 oz (28 g) per day.  Eat more home-cooked food and less restaurant, buffet, and fast food.  Limit fried foods.  Cook foods using methods other than frying.  Limit canned vegetables. If you do use them, rinse them well to decrease the sodium.  When eating at a restaurant, ask that your food be prepared with less salt, or no salt if possible. WHAT FOODS CAN I EAT? Seek help from a dietitian for individual calorie needs. Grains Whole grain or whole wheat bread. Brown rice. Whole grain or whole wheat pasta. Quinoa, bulgur, and whole grain cereals. Low-sodium cereals. Corn or whole wheat flour tortillas. Whole grain cornbread. Whole grain crackers. Low-sodium crackers. Vegetables Fresh or frozen vegetables (raw, steamed, roasted, or grilled). Low-sodium or reduced-sodium tomato and vegetable juices. Low-sodium or reduced-sodium tomato sauce and paste. Low-sodium or reduced-sodium canned vegetables.  Fruits All fresh, canned (in natural juice), or frozen fruits. Meat and Other Protein Products Ground beef (85% or leaner), grass-fed beef, or beef  trimmed of fat. Skinless chicken or [place]. Ground chicken or [place]. Pork trimmed of fat. All fish and seafood. Eggs. Dried beans, peas, or lentils. Unsalted nuts and seeds. Unsalted canned beans. Dairy Low-fat dairy products, such as skim or 1% milk, 2% or reduced-fat cheeses, low-fat ricotta or cottage cheese, or plain low-fat yogurt. Low-sodium or reduced-sodium cheeses. Fats and Oils Tub margarines without trans fats. Light or reduced-fat mayonnaise and salad dressings (reduced sodium). Avocado. Safflower, olive, or canola oils. Natural peanut or almond butter. Other Unsalted popcorn and pretzels. The items listed above may not be a complete list of recommended foods or beverages. Contact your dietitian for more options. WHAT FOODS ARE NOT RECOMMENDED? Grains White bread. White pasta. White rice. Refined cornbread. Bagels and croissants. Crackers that contain trans fat. Vegetables Creamed or fried vegetables. Vegetables in a cheese sauce. Regular canned vegetables. Regular canned tomato sauce and paste. Regular tomato and vegetable juices. Fruits Dried fruits. Canned fruit in light or heavy syrup. Fruit juice. Meat and Other Protein Products Fatty cuts of meat. Ribs, chicken wings,  bacon, sausage, bologna, salami, chitterlings, fatback, hot dogs, bratwurst, and packaged luncheon meats. Salted nuts and seeds. Canned beans with salt. Dairy Whole or 2% milk, cream, half-and-half, and cream cheese. Whole-fat or sweetened yogurt. Full-fat cheeses or blue cheese. Nondairy creamers and whipped toppings. Processed cheese, cheese spreads, or cheese curds. Condiments Onion and garlic salt, seasoned salt, table salt, and sea salt. Canned and packaged gravies. Worcestershire sauce. Tartar sauce. Barbecue sauce. Teriyaki sauce. Soy sauce, including reduced sodium. Steak sauce. Fish sauce. Oyster sauce. Cocktail sauce. Horseradish. Ketchup and mustard. Meat flavorings and tenderizers. Bouillon cubes. Hot  sauce. Tabasco sauce. Marinades. Taco seasonings. Relishes. Fats and Oils Butter, stick margarine, lard, shortening, ghee, and bacon fat. Coconut, palm kernel, or palm oils. Regular salad dressings. Other Pickles and olives. Salted popcorn and pretzels. The items listed above may not be a complete list of foods and beverages to avoid. Contact your dietitian for more information. WHERE CAN I FIND MORE INFORMATION? National Heart, Lung, and Blood Institute: travelstabloid.com Document Released: 01/31/2011 Document Revised: 02/16/2013 Document Reviewed: 12/16/2012 [facility] Patient Information 2015 [place], [place]. This information is not intended to replace advice given to you by your health care provider. Make sure you discuss any questions you have with your health care provider.  Basics of Medication Management UNDERSTAND YOUR MEDICATIONS  Read all of the labels and inserts that come with your medications. Review the information on this form often.  Know what potential side effects to look for (for each medication).  Know what each of your medications look like (by color, shape, size, stamp). If you are getting confused and having a hard time telling them apart, talk with your caregiver or pharmacist. They may be able to change the medication or help you to identify them more easily.  Check with your pharmacist if you notice a difference in the size or color of your medication.  Get all of your medications at one pharmacy. The pharmacist will have all of your information and understand possible drug interactions.  Ask your caregiver questions about your prescriptions and any over-the-counter medications, vitamins, herbal or dietary supplements that you take. TAKE YOUR MEDICATION SAFELY  Take medications only as prescribed.  Talk with your caregiver or pharmacist if some of your pills look the same and it is difficult to tell them apart. They can help you  to recognize different medications.  Never double up on your medication.  Never take anyone else's medication or share your medications.  Do not stop taking your medication(s) unless you have discussed it with your caregiver.  Do not split, mash, or chew medications unless your caregiver tells you to do so. Tell your caregiver if you have trouble swallowing your medication(s).  For liquid medications, make sure you use the dosing container provided to you.  You may need to avoid alcohol or certain foods or liquids with one or more of your medications. Make sure you remember how to take each medication with some of the tools below. ORGANIZE YOUR MEDICATIONS  Use a tool, such as a weekly pill box (available at your local pharmacy), written chart from your caregiver, notebook, binder, or your own calendar to organize your daily medications. Please note: if you are having trouble telling your different medications apart, keep them in the original bottles.  Set cues or reminders for taking your medications. Use watch alarms, mobile device/phone calendar alarms, or sticky notes.  More advanced medication management systems are also available. These offer weekly or monthly options complete  with storage, alarms, and visual and audio prompts.  Your system should help you to keep track of the:  Name of medication and dose.  Day.  Time.  Pill to take (by color, shape, size, or name/imprint).  How to take it (with or without certain foods, on an empty stomach, with fluids etc.).  Review your medication schedule with a family member or friend to help you. Other household members should understand your medications.  If you are taking medications on an "as needed" basis such as those for nausea or pain, write down the name, dose, and time you took the medication so that you remember what you have taken. PLAN AHEAD FOR REFILLS AND TRAVEL  Take your pill box, medications, and calendar system  with you when you travel.  Plan ahead for refills as to not run out of your medication(s).  Always carry an updated list of your medications with you. If there is an emergency, a respondent can quickly see what medications you are taking. STORE AND DISCARD YOUR MEDICATIONS SAFELY  Store medications in a cool, dry area away from light. (The bathroom is not a good place for storage because of heat and humidity.)  Store your medications away from chemicals, pet medications, or other family member's medications.  Keep medications out of children's reach, away from counters and bedside tables. Store them up high in cabinets or shelves.  Check expiration dates regularly.  Learn about the best way to dispose of each medication you take. Find out if your local government recycling program has a Medicine Take Back program for safe disposal. If not, some medications may be mixed with inedible substances and thrown away in the trash. Certain medications are to be flushed down the toilet. REMEMBER:  Tell your caregiver if you experience side effects, new symptoms, or have other concerns. There may be dosing changes or alternative medications that would be better for you.  Review your medications regularly with your caregiver. Check to see if you need to continue to take each medication, and discuss how well they are working. Medicines, diet, medical conditions, weight changes, and other habits can all affect how medicines work. PEDIATRIC CONSIDERATIONS If you are taking care of an infant or child who needs multiple medications, follow the tips above to organize a medication schedule and safely give and store medications.   Use positive reinforcement (singing, cuddling, reward) for your child to help him or her take necessary medications.  Use only syringes, droppers, dosing spoons, or dosing cups provided by your caregiver or pharmacist.  Always wash your hands before giving medications.  Get to  know your child's school medication policies. Meet with the school nurse to review the medication schedule in detail. Never send the medication to school with your child.  Check with your child's caregiver if he or she has trouble taking medication, forgets a dose, or spits it up.  Make sure your child knows how to use an inhaler properly if needed.  Do not give your child over-the-counter cough and cold medicines if they are under 69 years of age.  Avoid giving your child or teenager Aspirin or Aspirin-containing products. Document Released: 05/29/2010 Document Revised: 05/06/2011 Document Reviewed: 05/29/2010 [facility] Patient Information 2015 [place], [place]. This information is not intended to replace advice given to you by your health care provider. Make sure you discuss any questions you have with your health care provider.

## 2013-09-11 NOTE — ED Notes (Signed)
Patient transported to X-ray 

## 2013-09-11 NOTE — ED Provider Notes (Signed)
CSN: [id number]     Arrival date & time 09/11/13  4193 History   First MD Initiated Contact with Patient 09/11/13 0840     Chief Complaint  Patient presents with  . Chest Pain     (Consider location/radiation/quality/duration/timing/severity/associated sxs/prior Treatment) HPI  Victoria Padilla Is a 48 year old female with a past medical history of sarcoidosis, obesity, hyperlipidemia, hypertension, diabetes, and acid reflux he presents to the emergency department with chief complaint of chest pain. Patient states that she was at work today when she had sudden onset sharp central chest pain which lasted approximately 5 seconds. Patient states it resolved but when she moves her arms she began having a deep aching substernal pain associated chest pressure. She says that she called the nurse at work and was in a bicycle the emergency department. She denies any associated nausea, vomiting, diaphoresis, pain in the left jaw or left arm. She states the symptoms lasted approximately 10 minutes and have resolved. She denies any history of DVT or PE. No unilateral leg swelling, heat, pain, presents procedures to the right, recent confinement or surgeries. Patient has no smoking history. She does not take exogenous estrogens. Denies any recent heavy lifting or straining. Denies fevers, chills, myalgias, arthralgias. Denies DOE, SOB, radiation to left arm, jaw or back, or diaphoresis. Denies dysuria, flank pain, suprapubic pain, frequency, urgency, or hematuria. Denies headaches, light headedness, weakness, visual disturbances. Denies abdominal pain, nausea, vomiting, diarrhea or constipation.    Past Medical History  Diagnosis Date  . Arthritis   . Hypertension   . Sarcoidosis   . LBP (low back pain)   . Hyperlipidemia   . Vitamin D deficiency   . Obesity   . Cervical dysplasia   . Diabetes mellitus   . Acid reflux    Past Surgical History  Procedure Laterality Date  . Cholecystectomy    .  Bunionectomy    . Knee arthroscopy    . Colposcopy    . Gynecologic cryosurgery    . Tubal ligation     Family History  Problem Relation Age of Onset  . Heart failure Mother   . Heart failure Father   . Hypertension Maternal Uncle   . Colon cancer Maternal Grandmother   . Cancer Brother     prostate   History  Substance Use Topics  . Smoking status: Never Smoker   . Smokeless tobacco: Not on file  . Alcohol Use: No   OB History   Grav Para Term Preterm Abortions TAB SAB Ect Mult Living   3 3 3       3      Review of Systems  Ten systems reviewed and are negative for acute change, except as noted in the HPI.    Allergies  Aspirin and Naprosyn  Home Medications   Prior to Admission medications   Medication Sig Start Date End Date Taking? Authorizing Provider  albuterol (PROVENTIL) (2.5 MG/3ML) 0.083% nebulizer solution Take 2.5 mg by nebulization every 6 (six) hours as needed.      Historical Provider, MD  amLODipine (NORVASC) 10 MG tablet Take 1 tablet (10 mg total) by mouth daily. 02/01/13   Susy Frizzle, MD  benazepril (LOTENSIN) 20 MG tablet Take 1 tablet (20 mg total) by mouth daily. 02/01/13   Susy Frizzle, MD  Cholecalciferol (VITAMIN D) 2000 UNITS CAPS Take by mouth.      Historical Provider, MD  cyclobenzaprine (FLEXERIL) 5 MG tablet Take 1 tablet (5 mg total)  by mouth 3 (three) times daily as needed for muscle spasms. 04/18/12   Dorie Rank, MD  diclofenac (VOLTAREN) 50 MG EC tablet Take 50 mg by mouth 2 (two) times daily.      Historical Provider, MD  fexofenadine (ALLEGRA) 180 MG tablet Take 180 mg by mouth daily.      Historical Provider, MD  furosemide (LASIX) 20 MG tablet Take 1 tablet (20 mg total) by mouth daily. 02/01/13   Susy Frizzle, MD  hydrochlorothiazide (HYDRODIURIL) 25 MG tablet Take 1 tablet (25 mg total) by mouth daily. 02/01/13   Susy Frizzle, MD  HYDROcodone-acetaminophen (NORCO) 5-325 MG per tablet Take 1-2 tablets by mouth every 6  (six) hours as needed for pain. 04/18/12   Dorie Rank, MD  Insulin Glargine 100 UNIT/ML SOPN Inject 70 Units into the skin daily. 02/01/13   Susy Frizzle, MD  IRON PO Take by mouth.    Historical Provider, MD  metFORMIN (GLUCOPHAGE) 1000 MG tablet Take 1 tablet (1,000 mg total) by mouth 2 (two) times daily with a meal. 02/01/13   Susy Frizzle, MD  minocycline (MINOCIN) 100 MG capsule Take 1 capsule (100 mg total) by mouth 2 (two) times daily. 06/21/13   Lonie Peak Dixon, PA-C  Multiple Vitamins-Minerals (MULTIVITAMIN PO) Take 1 capsule by mouth 2 (two) times daily. special compounded vitamin supplement 06/11/13   Historical Provider, MD  Omega-3 Fatty Acids (FISH OIL PO) Take by mouth.    Historical Provider, MD  pantoprazole (PROTONIX) 40 MG tablet Take 1 tablet (40 mg total) by mouth daily. 02/01/13   Susy Frizzle, MD  pravastatin (PRAVACHOL) 40 MG tablet Take 1 tablet (40 mg total) by mouth daily. 02/01/13   Susy Frizzle, MD  vitamin E 100 UNIT capsule Take 100 Units by mouth daily.      Historical Provider, MD   BP 187/97  Pulse 97  Temp(Src) 98.8 F (37.1 C) (Oral)  Resp 15  SpO2 98% Physical Exam  Nursing note and vitals reviewed. Constitutional: She is oriented to person, place, and time. She appears well-developed and well-nourished. No distress.  HENT:  Head: Normocephalic and atraumatic.  Eyes: Conjunctivae are normal. No scleral icterus.  Neck: Normal range of motion. No JVD present.  Cardiovascular: Normal rate, regular rhythm and normal heart sounds.  Exam reveals no gallop and no friction rub.   No murmur heard. No pitting edema  Pulmonary/Chest: Effort normal and breath sounds normal. No respiratory distress.  Abdominal: Soft. Bowel sounds are normal. She exhibits no distension and no mass. There is no tenderness. There is no guarding.  Neurological: She is alert and oriented to person, place, and time.  Skin: Skin is warm and dry. She is not diaphoretic.    ED  Course  Procedures (including critical care time) Labs Review Labs Reviewed  CBC - Abnormal; Notable for the following:    RBC 5.43 (*)    MCV 72.2 (*)    MCH 22.7 (*)    All other components within normal limits  COMPREHENSIVE METABOLIC PANEL - Abnormal; Notable for the following:    Glucose, Bld 208 (*)    Albumin 3.4 (*)    AST 44 (*)    ALT 53 (*)    Alkaline Phosphatase 144 (*)    Total Bilirubin 0.2 (*)    All other components within normal limits  URINALYSIS, ROUTINE W REFLEX MICROSCOPIC  I-STAT TROPOININ, ED  CBG MONITORING, ED    Imaging Review  No results found.   EKG Interpretation None     ECG interpretation   Date: 09/11/2013  Rate: 85  Rhythm: normal sinus rhythm  QRS Axis: normal  Intervals: normal  ST/T Wave abnormalities: normal  Conduction Disutrbances: none  Narrative Interpretation:   Old EKG Reviewed: no previous    MDM   Final diagnoses:  None     Filed Vitals:   09/11/13 0847 09/11/13 0857  BP: 192/107 187/97  Pulse: 97   Temp: 98.6 F (37 C) 98.8 F (37.1 C)  TempSrc: Oral Oral  Resp: 14 15  SpO2: 98% 98%    Patient with htn. Sxs are atypical for cardiac etiology. Patient does have a history of GERD and is not taking any acid reducing medications. She however does take a daily aspirin although it makes her nauseated. She sustained complaining of chronic mild aching in her stomach. She is wells low risk, but cannot use PERC. I however do not feel she has PE> no hemoptysis, pleurisy, si/sx of DVT. No hypoxia. EKG without abnormality. Negative troponin and cxr. Slight elevation in her transaminases .   patient with negative delta troponin. Her US shows abnormal echogenicity. I have discussed this with the patient and provided her with a report to take to her PCP.  Patient has had no repeat episode of chest pain here in the emergency department. Patient understands the necessity of following up with her primary care physician  as well as compliance with her medications.  Patient is to be discharged with recommendation to follow up with PCP in regards to today's hospital visit. Chest pain is not likely of cardiac or pulmonary etiology d/t presentation, WELLS  Low risk, VSS, no tracheal deviation, no JVD or new murmur, RRR, breath sounds equal bilaterally, EKG without acute abnormalities, negative troponin, and negative CXR. Pt has been advised start a PPI and return to the ED is CP becomes exertional, associated with diaphoresis or nausea, radiates to left jaw/arm, worsens or becomes concerning in any way. Pt appears reliable for follow up and is agreeable to discharge.   Case has been discussed with and seen by Dr. Tawnya Crook who agrees with the above plan to discharge.    Margarita Mail, PA-C 09/11/13 1451

## 2013-09-11 NOTE — ED Provider Notes (Signed)
Medical screening examination/treatment/procedure(s) were performed by non-physician practitioner and as supervising physician I was immediately available for consultation/collaboration.   EKG Interpretation   Date/Time:  Saturday September 11 2013 08:44:33 EDT Ventricular Rate:  85 PR Interval:  169 QRS Duration: 83 QT Interval:  371 QTC Calculation: 441 R Axis:   39 Text Interpretation:  Sinus rhythm Baseline wander in lead(s) V1 No  significant change was found Confirmed by [place]  MD, Kelden Lavallee ([id number]) on  09/11/2013 11:10:38 AM        Neta Ehlers, MD 09/11/13 2042

## 2013-09-11 NOTE — ED Notes (Signed)
Brought pt back to room; pt undressed, in gown, on monitor, continuous pulse oximetry and blood pressure cuff

## 2013-09-20 ENCOUNTER — Encounter: Payer: Self-pay | Admitting: Family Medicine

## 2013-09-20 ENCOUNTER — Ambulatory Visit (INDEPENDENT_AMBULATORY_CARE_PROVIDER_SITE_OTHER): Payer: BC Managed Care – PPO | Admitting: Family Medicine

## 2013-09-20 VITALS — BP 130/88 | HR 80 | Temp 98.8°F | Resp 20 | Ht 63.0 in | Wt 249.0 lb

## 2013-09-20 DIAGNOSIS — Z09 Encounter for follow-up examination after completed treatment for conditions other than malignant neoplasm: Secondary | ICD-10-CM

## 2013-09-20 MED ORDER — OMEPRAZOLE 40 MG PO CPDR: 40.0000 mg | DELAYED_RELEASE_CAPSULE | Freq: Every day | ORAL | Status: DC

## 2013-09-20 MED ORDER — INSULIN NPH ISOPHANE & REGULAR (70-30) 100 UNIT/ML ~~LOC~~ SUSP: SUBCUTANEOUS | Status: DC

## 2013-09-20 MED ORDER — "NEEDLE (DISP) 30G X 1/2"" MISC": Status: DC

## 2013-09-20 MED ORDER — INSULIN SYRINGES (DISPOSABLE) U-100 1 ML MISC: Status: DC

## 2013-09-20 NOTE — Progress Notes (Signed)
Subjective:    Patient ID: Victoria Padilla, female    DOB: [birth year], 48 y.o.   MRN: [id number]  HPI Patient was recently seen in the emergency room for atypical chest pain. Pain began suddenly. It was substernal and rotation. It is aggravated by rotation of the upper body. It was not exacerbated by physical exertion.  She denies any shortness of breath. She denies any dyspnea on exertion. She has been having an excessive amount of heartburn recently and she discontinued her PPI. She denies any pleurisy. She denies any cough. Troponin was negative in the emergency room. Chest x-ray was clear. Patient did have slight elevation in her liver function tests. Liver ultrasound revealed evidence of possible underlying liver disease.  Patient denies any risk factors for hepatitis C he wishes not consuming alcohol. Her cholesterol when checked in December was excellent. She's never been screened for viral hepatitis. Eventually she has uncontrolled blood due to cost. She is not checking her blood sugars. In December, her hemoglobin A1c was greater than 12 and her random blood sugar was 300. Past Medical History  Diagnosis Date  . Arthritis   . Hypertension   . Sarcoidosis   . LBP (low back pain)   . Hyperlipidemia   . Vitamin D deficiency   . Obesity   . Cervical dysplasia   . Diabetes mellitus   . Acid reflux    Current Outpatient Prescriptions on File Prior to Visit  Medication Sig Dispense Refill  . aspirin 81 MG tablet Take 81 mg by mouth daily.      . Cyanocobalamin (B-12 PO) Take 1 tablet by mouth daily.      . Multiple Vitamins-Minerals (MULTIVITAMIN PO) Take 2 capsules by mouth 2 (two) times daily. special compounded vitamin supplement      . Omega-3 Fatty Acids (FISH OIL PO) Take 1 capsule by mouth daily.        No current facility-administered medications on file prior to visit.   Allergies  Allergen Reactions  . Aspirin Nausea Only  . Naprosyn [Naproxen]    History   Social  History  . Marital Status: Single    Spouse Name: N/A    Number of Children: N/A  . Years of Education: N/A   Occupational History  . Not on file.   Social History Main Topics  . Smoking status: Never Smoker   . Smokeless tobacco: Not on file  . Alcohol Use: No  . Drug Use: No  . Sexual Activity: No   Other Topics Concern  . Not on file   Social History Narrative  . No narrative on file      Review of Systems  All other systems reviewed and are negative.      Objective:   Physical Exam  Vitals reviewed. Constitutional: She appears well-developed and well-nourished.  Neck: Neck supple. No JVD present.  Cardiovascular: Normal rate, regular rhythm, normal heart sounds and intact distal pulses.   No murmur heard. Pulmonary/Chest: Effort normal and breath sounds normal. No respiratory distress. She has no wheezes. She has no rales.  Abdominal: Soft. Bowel sounds are normal. She exhibits no distension. There is no tenderness. There is no rebound and no guarding.  Musculoskeletal: She exhibits no edema.  Lymphadenopathy:    She has no cervical adenopathy.          Assessment & Plan:  1. Hospital discharge follow-up I believe the patient's atypical chest pain was likely due to her gastroesophageal reflux disease.  I've recommended she begin omeprazole 40 mg by mouth daily. If chest pain returns, I recommend cardiology consultation for stress test and also possibly GI consultation for EGD. I believe the patient likely has fatty liver disease although I cannot exclude sarcoidosis or viral hepatitis as a cause of her liver function animality's. I will start the patient on insulin to try get her blood sugars under control and then recheck her liver function tests in 3 months. If still elevated, I would recommend a viral hepatitis panel and also a GI consultation. Begin Novolin 70/30 35 units qam and 15 units QPM and recheck fasting blood sugars and two-hour postprandial sugars in  2 weeks. I titrate her insulin further at that time. I warned the patient against hypoglycemia. Prior to discontinuing her insulin she was taking as much and 70 units of Lantus. Therefore I believe a stent should be safe. Recheck in 2 weeks

## 2013-09-21 ENCOUNTER — Telehealth: Payer: Self-pay | Admitting: Family Medicine

## 2013-09-21 NOTE — Telephone Encounter (Signed)
Error

## 2013-09-24 ENCOUNTER — Telehealth: Payer: Self-pay | Admitting: *Deleted

## 2013-09-24 NOTE — Telephone Encounter (Signed)
I do not believe the leg swelling is due to insulin.  Make sure she is no consuming a large amount of sodium.  High sodium diet can cause leg swelling.  If swelling is significant, she needs to be seen to rule out other problems.

## 2013-09-24 NOTE — Telephone Encounter (Signed)
Called pt back and is aware of message, she is not consuming any extra soduim, states she will wait and see how her swelling goes by elevating feet,etc, if no better by Monday she will give his a call to schedule appointment.

## 2013-09-24 NOTE — Telephone Encounter (Signed)
Pt called saying she was here on Monday and was given new insulin Novolin 70/30 and says since taking it her legs have been swelling and wants to know what she needs to do?

## 2013-10-21 ENCOUNTER — Other Ambulatory Visit: Payer: 59

## 2013-12-27 ENCOUNTER — Encounter: Payer: Self-pay | Admitting: Family Medicine

## 2014-03-22 ENCOUNTER — Encounter: Payer: Self-pay | Admitting: Family Medicine

## 2014-04-01 ENCOUNTER — Encounter: Payer: Self-pay | Admitting: Family Medicine

## 2014-04-01 ENCOUNTER — Telehealth: Payer: Self-pay | Admitting: Family Medicine

## 2014-04-01 NOTE — Telephone Encounter (Signed)
Received copy of form dated 11/19/2012.  It is a pre populated form that includes Diabetic supplies AND wound care cream and Vitamin supplements.  Items 1-2-3 are left blank for provider to fill in for diabetic supply needs.  The supply order section is already checked by company in question for Diabetic supplies and also the cream and supplements.  The amount of diabetic supplies needed was completed by our staff and provider did sign form.  The fact the other miscellaneous items were marked was not noted.  Patient did not and does not need.  Letter is response to their claim was drafted and sent to them.

## 2014-04-01 NOTE — Telephone Encounter (Signed)
They are calling about a prescription for some sort of cream.  She was unable to tell me what it was.  The RX was from "2014"  They were wanting verification that the RX came from [place].  She states that we have denied the RX (could find no record)  I told her to fax [place] a copy of RX.  We will review and get back top them.

## 2014-05-09 ENCOUNTER — Encounter: Payer: Self-pay | Admitting: Family Medicine

## 2014-08-09 ENCOUNTER — Other Ambulatory Visit: Payer: Self-pay | Admitting: Family Medicine

## 2014-08-09 NOTE — Telephone Encounter (Signed)
Medication filled x1 with no refills.   Requires office visit before any further refills can be given.   Letter sent.  

## 2014-11-06 ENCOUNTER — Encounter (HOSPITAL_COMMUNITY): Payer: Self-pay

## 2014-11-06 ENCOUNTER — Emergency Department (HOSPITAL_COMMUNITY)
Admission: EM | Admit: 2014-11-06 | Discharge: 2014-11-06 | Disposition: A | Discharge status: Home / Self Care | Payer: 59 | Attending: Emergency Medicine | Admitting: Emergency Medicine

## 2014-11-06 DIAGNOSIS — E669 Obesity, unspecified: Secondary | ICD-10-CM | POA: Insufficient documentation

## 2014-11-06 DIAGNOSIS — E785 Hyperlipidemia, unspecified: Secondary | ICD-10-CM | POA: Insufficient documentation

## 2014-11-06 DIAGNOSIS — Z7982 Long term (current) use of aspirin: Secondary | ICD-10-CM | POA: Insufficient documentation

## 2014-11-06 DIAGNOSIS — I10 Essential (primary) hypertension: Secondary | ICD-10-CM | POA: Insufficient documentation

## 2014-11-06 DIAGNOSIS — K219 Gastro-esophageal reflux disease without esophagitis: Secondary | ICD-10-CM | POA: Insufficient documentation

## 2014-11-06 DIAGNOSIS — L988 Other specified disorders of the skin and subcutaneous tissue: Secondary | ICD-10-CM | POA: Insufficient documentation

## 2014-11-06 DIAGNOSIS — Z794 Long term (current) use of insulin: Secondary | ICD-10-CM | POA: Insufficient documentation

## 2014-11-06 DIAGNOSIS — Z9049 Acquired absence of other specified parts of digestive tract: Secondary | ICD-10-CM | POA: Insufficient documentation

## 2014-11-06 DIAGNOSIS — Z79899 Other long term (current) drug therapy: Secondary | ICD-10-CM | POA: Insufficient documentation

## 2014-11-06 DIAGNOSIS — E119 Type 2 diabetes mellitus without complications: Secondary | ICD-10-CM | POA: Insufficient documentation

## 2014-11-06 DIAGNOSIS — T798XXA Other early complications of trauma, initial encounter: Secondary | ICD-10-CM

## 2014-11-06 DIAGNOSIS — M1990 Unspecified osteoarthritis, unspecified site: Secondary | ICD-10-CM | POA: Insufficient documentation

## 2014-11-06 DIAGNOSIS — E559 Vitamin D deficiency, unspecified: Secondary | ICD-10-CM | POA: Insufficient documentation

## 2014-11-06 DIAGNOSIS — Z8619 Personal history of other infectious and parasitic diseases: Secondary | ICD-10-CM | POA: Insufficient documentation

## 2014-11-06 MED ORDER — DOXYCYCLINE HYCLATE 100 MG PO CAPS: 100.0000 mg | ORAL_CAPSULE | Freq: Two times a day (BID) | ORAL | Status: DC

## 2014-11-06 MED ORDER — BACITRACIN ZINC 500 UNIT/GM EX OINT
TOPICAL_OINTMENT | Freq: Two times a day (BID) | CUTANEOUS | Status: DC
  Administered 2014-11-06: 17:00:00 via TOPICAL

## 2014-11-06 NOTE — Discharge Instructions (Signed)
Wash the area with soap and water. Keep it clean and dry. Bacitracin topically twice a day. Take oral antibiotics as prescribed. Follow up with primary care doctor for recheck. Return if worsening.   Wound Infection A wound infection happens when a type of germ (bacteria) starts growing in the wound. In some cases, this can cause the wound to break open. If cared for properly, the infected wound will heal from the inside to the outside. Wound infections need treatment. CAUSES An infection is caused by bacteria growing in the wound.  SYMPTOMS   Increase in redness, swelling, or pain at the wound site.  Increase in drainage at the wound site.  Wound or bandage (dressing) starts to smell bad.  Fever.  Feeling tired or fatigued.  Pus draining from the wound. TREATMENT  Your health care provider will prescribe antibiotic medicine. The wound infection should improve within 24 to 48 hours. Any redness around the wound should stop spreading and the wound should be less painful.  HOME CARE INSTRUCTIONS   Only take over-the-counter or prescription medicines for pain, discomfort, or fever as directed by your health care provider.  Take your antibiotics as directed. Finish them even if you start to feel better.  Gently wash the area with mild soap and water 2 times a day, or as directed. Rinse off the soap. Pat the area dry with a clean towel. Do not rub the wound. This may cause bleeding.  Follow your health care provider's instructions for how often you need to change the dressing.  Apply ointment and a dressing to the wound as directed.  If the dressing sticks, moisten it with soapy water and gently remove it.  Change the bandage right away if it becomes wet, dirty, or develops a bad smell.  Take showers. Do not take tub baths, swim, or do anything that may soak the wound until it is healed.  Avoid exercises that make you sweat heavily.  Use anti-itch medicine as directed by your  health care provider. The wound may itch when it is healing. Do not pick or scratch at the wound.  Follow up with your health care provider to get your wound rechecked as directed. SEEK MEDICAL CARE IF:  You have an increase in swelling, pain, or redness around the wound.  You have an increase in the amount of pus coming from the wound.  There is a bad smell coming from the wound.  More of the wound breaks open.  You have a fever. MAKE SURE YOU:   Understand these instructions.  Will watch your condition.  Will get help right away if you are not doing well or get worse. Document Released: 11/10/2002 Document Revised: 02/16/2013 Document Reviewed: 06/17/2010 [facility] Patient Information 2015 [place], [place]. This information is not intended to replace advice given to you by your health care provider. Make sure you discuss any questions you have with your health care provider.

## 2014-11-06 NOTE — ED Provider Notes (Signed)
CSN: [id number]     Arrival date & time 11/06/14  1550 History  This chart was scribed for non-physician practitioner Jeannett Senior, PA-C working with Lacretia Leigh, MD by Meriel Pica, ED Scribe. This patient was seen in room WTR8/WTR8 and the patient's care was started at 4:52 PM.   Chief Complaint  Patient presents with  . Insect Bite   The history is provided by the patient. No language interpreter was used.   HPI Comments: Victoria Padilla is a 49 y.o. female, with a PMhx of HTN and DM, who presents to the Emergency Department complaining of a gradually worsening pustule area of swelling, erythema, and pain to left, lower abdomen onset 3 days ago s/p possible insect bite. Pt did not visualize the insect. Tetanus UTD. Denies the area to be pruritic, fevers or chills.   Past Medical History  Diagnosis Date  . Arthritis   . Hypertension   . Sarcoidosis   . LBP (low back pain)   . Hyperlipidemia   . Vitamin D deficiency   . Obesity   . Cervical dysplasia   . Diabetes mellitus   . Acid reflux    Past Surgical History  Procedure Laterality Date  . Cholecystectomy    . Bunionectomy    . Knee arthroscopy    . Colposcopy    . Gynecologic cryosurgery    . Tubal ligation     Family History  Problem Relation Age of Onset  . Heart failure Mother   . Heart failure Father   . Hypertension Maternal Uncle   . Colon cancer Maternal Grandmother   . Cancer Brother     prostate   Social History  Substance Use Topics  . Smoking status: Never Smoker   . Smokeless tobacco: None  . Alcohol Use: No   OB History    Gravida Para Term Preterm AB TAB SAB Ectopic Multiple Living   3 3 3       3      Review of Systems  Constitutional: Negative for fever and chills.  Skin: Positive for color change ( erythema).   Allergies  Aspirin and Naprosyn  Home Medications   Prior to Admission medications   Medication Sig Start Date End Date Taking? Authorizing Provider  aspirin 81 MG  tablet Take 81 mg by mouth daily.    Historical Provider, MD  benazepril (LOTENSIN) 20 MG tablet TAKE ONE TABLET BY MOUTH ONCE DAILY 08/09/14   Susy Frizzle, MD  Cyanocobalamin (B-12 PO) Take 1 tablet by mouth daily.    Historical Provider, MD  ferrous sulfate 325 (65 FE) MG tablet Take 325 mg by mouth daily with breakfast.    Historical Provider, MD  furosemide (LASIX) 20 MG tablet TAKE ONE TABLET BY MOUTH ONCE DAILY 08/09/14   Susy Frizzle, MD  hydrochlorothiazide (HYDRODIURIL) 25 MG tablet TAKE ONE TABLET BY MOUTH ONCE DAILY 08/09/14   Susy Frizzle, MD  insulin NPH-regular Human (NOVOLIN 70/30) (70-30) 100 UNIT/ML injection 35 units sq in am, 15 units sq in pm. 09/20/13   Susy Frizzle, MD  Insulin Syringes, Disposable, U-100 1 ML MISC Uses BID - Dx 250.00 09/20/13   Susy Frizzle, MD  metFORMIN (GLUCOPHAGE) 1000 MG tablet TAKE ONE TABLET BY MOUTH TWICE DAILY WITH A MEAL 08/09/14   Susy Frizzle, MD  Multiple Vitamins-Minerals (MULTIVITAMIN PO) Take 2 capsules by mouth 2 (two) times daily. special compounded vitamin supplement 06/11/13   Historical Provider, MD  NEEDLE,  DISP, 30 G (BD DISP NEEDLES) 30G X 1/2" MISC Uses BID - DX 250.00 09/20/13   Susy Frizzle, MD  Omega-3 Fatty Acids (FISH OIL PO) Take 1 capsule by mouth daily.     Historical Provider, MD  omeprazole (PRILOSEC) 40 MG capsule Take 1 capsule (40 mg total) by mouth daily. 09/20/13   Susy Frizzle, MD  pravastatin (PRAVACHOL) 40 MG tablet TAKE ONE TABLET BY MOUTH ONCE DAILY 08/09/14   Susy Frizzle, MD   BP 174/97 mmHg  Pulse 91  Temp(Src) 98.6 F (37 C) (Oral)  Resp 18  SpO2 97% Physical Exam  Constitutional: She appears well-developed and well-nourished. No distress.  HENT:  Head: Normocephalic.  Eyes: Conjunctivae are normal.  Neck: No JVD present.  Cardiovascular: Normal rate, regular rhythm and normal heart sounds.   Pulmonary/Chest: Effort normal. No respiratory distress. She has no wheezes. She  has no rales.  Neurological: She is alert. Coordination normal.  Skin: Skin is warm. No rash noted. No erythema. No pallor.  3cm diameter blister to the left lower abdominal wall containing purulent drainage with mild surrounding erythema.   Psychiatric: She has a normal mood and affect. Her behavior is normal.  Nursing note and vitals reviewed.  ED Course  Procedures  DIAGNOSTIC STUDIES: Oxygen Saturation is 97% on RA, normal by my interpretation.    COORDINATION OF CARE: 4:56 PM Discussed treatment plan which includes to clean the area with iodine and open and drain the area with pt. Will apply bacitracin and dress wound. Advised pt to wash area with soap and water and apply warm compresses for the next several days. Will prescribe an oral antibiotic. Pt acknowledges and agrees to plan.  INCISION AND DRAINAGE PROCEDURE NOTE: Patient identification was confirmed and verbal consent was obtained. This procedure was performed by Jeannett Senior, PA-C at 5:00 PM. Site: left, lower abdominal wall Sterile procedures observed Anesthetic used (type and amt): none Blade size: 11 Drainage: moderate, purulent drainage Packing used: none Incision made over site, wound drained and explored loculations, wound covered with dry, sterile dressing and bacitracin applied.  Pt tolerated procedure well without complications.  Instructions for care discussed verbally and pt provided with additional written instructions for homecare and f/u.   MDM   Final diagnoses:  Wound infection, initial encounter   Patient with purulent filled blister to the left lower abdominal wall, mild surrounding erythema. Consistent with infectious process. Blister incised and drained,bacitracin and sterile dressing applied. Will start on oral bionics. Unsure of the exact cause of the blister, potential insect bites versus contact dermatitis resulting in secondary infection. Advised to keep a close eye on the area. If it  is getting worse she needs to come back to emergency department.Patient otherwise nontoxic-appearing. Hypertensive. Asymptomatic for hypertension. Will need to have blood pressure rechecked in a week. Discussed careful wound care at home and need for follow-up.  Filed Vitals:   11/06/14 1621  BP: 174/97  Pulse: 91  Temp: 98.6 F (37 C)  TempSrc: Oral  Resp: 18  SpO2: 97%    I personally performed the services described in this documentation, which was scribed in my presence. The recorded information has been reviewed and is accurate.   Jeannett Senior, PA-C 11/06/14 2004  Lacretia Leigh, MD 11/06/14 2326

## 2014-11-06 NOTE — ED Notes (Signed)
?   Insect bite to left lower abdomen.  Started on Friday.  Dressing with antibiotic in place

## 2015-03-03 ENCOUNTER — Encounter: Payer: Self-pay | Admitting: Family Medicine

## 2015-03-03 ENCOUNTER — Ambulatory Visit (INDEPENDENT_AMBULATORY_CARE_PROVIDER_SITE_OTHER): Payer: BLUE CROSS/BLUE SHIELD | Admitting: Family Medicine

## 2015-03-03 ENCOUNTER — Other Ambulatory Visit: Payer: Self-pay | Admitting: Family Medicine

## 2015-03-03 VITALS — BP 140/98 | HR 68 | Wt 253.0 lb

## 2015-03-03 DIAGNOSIS — E1169 Type 2 diabetes mellitus with other specified complication: Secondary | ICD-10-CM | POA: Diagnosis not present

## 2015-03-03 DIAGNOSIS — IMO0001 Reserved for inherently not codable concepts without codable children: Secondary | ICD-10-CM

## 2015-03-03 DIAGNOSIS — E785 Hyperlipidemia, unspecified: Secondary | ICD-10-CM | POA: Diagnosis not present

## 2015-03-03 DIAGNOSIS — E1165 Type 2 diabetes mellitus with hyperglycemia: Secondary | ICD-10-CM

## 2015-03-03 DIAGNOSIS — I10 Essential (primary) hypertension: Secondary | ICD-10-CM | POA: Insufficient documentation

## 2015-03-03 DIAGNOSIS — Z7689 Persons encountering health services in other specified circumstances: Secondary | ICD-10-CM

## 2015-03-03 DIAGNOSIS — Z862 Personal history of diseases of the blood and blood-forming organs and certain disorders involving the immune mechanism: Secondary | ICD-10-CM

## 2015-03-03 DIAGNOSIS — E1139 Type 2 diabetes mellitus with other diabetic ophthalmic complication: Secondary | ICD-10-CM | POA: Insufficient documentation

## 2015-03-03 DIAGNOSIS — Z7189 Other specified counseling: Secondary | ICD-10-CM | POA: Diagnosis not present

## 2015-03-03 LAB — COMPREHENSIVE METABOLIC PANEL
ALK PHOS: 112 U/L (ref 33–130)
ALT: 25 U/L (ref 6–29)
AST: 20 U/L (ref 10–35)
Albumin: 3.5 g/dL — ABNORMAL LOW (ref 3.6–5.1)
BILIRUBIN TOTAL: 0.3 mg/dL (ref 0.2–1.2)
BUN: 11 mg/dL (ref 7–25)
CALCIUM: 9.2 mg/dL (ref 8.6–10.4)
CO2: 27 mmol/L (ref 20–31)
Chloride: 102 mmol/L (ref 98–110)
Creat: 0.66 mg/dL (ref 0.50–1.05)
Glucose, Bld: 264 mg/dL — ABNORMAL HIGH (ref 65–99)
Potassium: 4.5 mmol/L (ref 3.5–5.3)
Sodium: 136 mmol/L (ref 135–146)
Total Protein: 6.8 g/dL (ref 6.1–8.1)

## 2015-03-03 LAB — CBC WITH DIFFERENTIAL/PLATELET
Basophils Absolute: 0 10*3/uL (ref 0.0–0.1)
Basophils Relative: 0 % (ref 0–1)
Eosinophils Absolute: 0.1 10*3/uL (ref 0.0–0.7)
Eosinophils Relative: 1 % (ref 0–5)
HCT: 39.8 % (ref 36.0–46.0)
HEMOGLOBIN: 12.4 g/dL (ref 12.0–15.0)
LYMPHS ABS: 3 10*3/uL (ref 0.7–4.0)
LYMPHS PCT: 40 % (ref 12–46)
MCH: 22.6 pg — ABNORMAL LOW (ref 26.0–34.0)
MCHC: 31.2 g/dL (ref 30.0–36.0)
MCV: 72.6 fL — AB (ref 78.0–100.0)
MONO ABS: 0.6 10*3/uL (ref 0.1–1.0)
MPV: 11.3 fL (ref 8.6–12.4)
Monocytes Relative: 8 % (ref 3–12)
NEUTROS ABS: 3.8 10*3/uL (ref 1.7–7.7)
Neutrophils Relative %: 51 % (ref 43–77)
Platelets: 251 10*3/uL (ref 150–400)
RBC: 5.48 MIL/uL — AB (ref 3.87–5.11)
RDW: 16.1 % — ABNORMAL HIGH (ref 11.5–15.5)
WBC: 7.5 10*3/uL (ref 4.0–10.5)

## 2015-03-03 LAB — LIPID PANEL
Cholesterol: 173 mg/dL (ref 125–200)
HDL: 46 mg/dL (ref >=46)
LDL Cholesterol: 106 mg/dL (ref <130)
Total CHOL/HDL Ratio: 3.8 Ratio (ref <=5.0)
Triglycerides: 103 mg/dL (ref <150)
VLDL: 21 mg/dL (ref <30)

## 2015-03-03 LAB — POCT GLYCOSYLATED HEMOGLOBIN (HGB A1C): Hemoglobin A1C: 13.1

## 2015-03-03 MED ORDER — CANAGLIFLOZIN 100 MG PO TABS: 100.0000 mg | ORAL_TABLET | Freq: Every day | ORAL | Status: DC

## 2015-03-03 MED ORDER — DULAGLUTIDE 0.75 MG/0.5ML ~~LOC~~ SOAJ: SUBCUTANEOUS | Status: DC

## 2015-03-03 MED ORDER — METFORMIN HCL 1000 MG PO TABS: 1000.0000 mg | ORAL_TABLET | Freq: Two times a day (BID) | ORAL | Status: DC

## 2015-03-03 MED ORDER — GLUCOSE BLOOD VI STRP: ORAL_STRIP | Status: DC

## 2015-03-03 MED ORDER — BAYER MICROLET LANCETS MISC: Status: DC

## 2015-03-03 MED ORDER — BENAZEPRIL HCL 20 MG PO TABS: 20.0000 mg | ORAL_TABLET | Freq: Every day | ORAL | Status: DC

## 2015-03-03 MED ORDER — HYDROCHLOROTHIAZIDE 25 MG PO TABS: 25.0000 mg | ORAL_TABLET | Freq: Every day | ORAL | Status: DC

## 2015-03-03 NOTE — Patient Instructions (Signed)
Check your blood sugars twice daily once fasting before breakfast and the other time 2 hours after a meal either lunch or supper. Keep a journal of your blood sugars and blood pressure and come back next week to see me. Bring your meter when you come back. You should hear from the nutritionist in the next week.  Basic Carbohydrate Counting for Diabetes Mellitus Carbohydrate counting is a method for keeping track of the amount of carbohydrates you eat. Eating carbohydrates naturally increases the level of sugar (glucose) in your blood, so it is important for you to know the amount that is okay for you to have in every meal. Carbohydrate counting helps keep the level of glucose in your blood within normal limits. The amount of carbohydrates allowed is different for every person. A dietitian can help you calculate the amount that is right for you. Once you know the amount of carbohydrates you can have, you can count the carbohydrates in the foods you want to eat. Carbohydrates are found in the following foods:  Grains, such as breads and cereals.  Dried beans and soy products.  Starchy vegetables, such as potatoes, peas, and corn.  Fruit and fruit juices.  Milk and yogurt.  Sweets and snack foods, such as cake, cookies, candy, chips, soft drinks, and fruit drinks. CARBOHYDRATE COUNTING There are two ways to count the carbohydrates in your food. You can use either of the methods or a combination of both. Reading the "Nutrition Facts" on [place] The "Nutrition Facts" is an area that is included on the labels of almost all packaged food and beverages in the [place]. It includes the serving size of that food or beverage and information about the nutrients in each serving of the food, including the grams (g) of carbohydrate per serving.  Decide the number of servings of this food or beverage that you will be able to eat or drink. Multiply that number of servings by the number of grams of  carbohydrate that is listed on the label for that serving. The total will be the amount of carbohydrates you will be having when you eat or drink this food or beverage. Learning Standard Serving Sizes of Food When you eat food that is not packaged or does not include "Nutrition Facts" on the label, you need to measure the servings in order to count the amount of carbohydrates.A serving of most carbohydrate-rich foods contains about 15 g of carbohydrates. The following list includes serving sizes of carbohydrate-rich foods that provide 15 g ofcarbohydrate per serving:   1 slice of bread (1 oz) or 1 six-inch tortilla.    of a hamburger bun or English muffin.  4-6 crackers.   cup unsweetened dry cereal.    cup hot cereal.   cup rice or pasta.    cup mashed potatoes or  of a large baked potato.  1 cup fresh fruit or one small piece of fruit.    cup canned or frozen fruit or fruit juice.  1 cup milk.   cup plain fat-free yogurt or yogurt sweetened with artificial sweeteners.   cup cooked dried beans or starchy vegetable, such as peas, corn, or potatoes.  Decide the number of standard-size servings that you will eat. Multiply that number of servings by 15 (the grams of carbohydrates in that serving). For example, if you eat 2 cups of strawberries, you will have eaten 2 servings and 30 g of carbohydrates (2 servings x 15 g = 30 g). For foods  such as soups and casseroles, in which more than one food is mixed in, you will need to count the carbohydrates in each food that is included. EXAMPLE OF CARBOHYDRATE COUNTING Sample Dinner  3 oz chicken breast.   cup of brown rice.   cup of corn.  1 cup milk.   1 cup strawberries with sugar-free whipped topping.  Carbohydrate Calculation Step 1: Identify the foods that contain carbohydrates:   Rice.   Corn.   Milk.   Strawberries. Step 2:Calculate the number of servings eaten of each:   2 servings of rice.    1 serving of corn.   1 serving of milk.   1 serving of strawberries. Step 3: Multiply each of those number of servings by 15 g:   2 servings of rice x 15 g = 30 g.   1 serving of corn x 15 g = 15 g.   1 serving of milk x 15 g = 15 g.   1 serving of strawberries x 15 g = 15 g. Step 4: Add together all of the amounts to find the total grams of carbohydrates eaten: 30 g + 15 g + 15 g + 15 g = 75 g.   This information is not intended to replace advice given to you by your health care provider. Make sure you discuss any questions you have with your health care provider.   Document Released: 02/11/2005 Document Revised: 03/04/2014 Document Reviewed: 01/08/2013 Elsevier Interactive Patient Education 2016 [place] DASH stands for "Dietary Approaches to Stop Hypertension." The DASH eating plan is a healthy eating plan that has been shown to reduce high blood pressure (hypertension). Additional health benefits may include reducing the risk of type 2 diabetes mellitus, heart disease, and stroke. The DASH eating plan may also help with weight loss. WHAT DO I NEED TO KNOW ABOUT THE DASH EATING PLAN? For the DASH eating plan, you will follow these general guidelines:  Choose foods with a percent daily value for sodium of less than 5% (as listed on the food label).  Use salt-free seasonings or herbs instead of table salt or sea salt.  Check with your health care provider or pharmacist before using salt substitutes.  Eat lower-sodium products, often labeled as "lower sodium" or "no salt added."  Eat fresh foods.  Eat more vegetables, fruits, and low-fat dairy products.  Choose whole grains. Look for the word "whole" as the first word in the ingredient list.  Choose fish and skinless chicken or [place] more often than red meat. Limit fish, poultry, and meat to 6 oz (170 g) each day.  Limit sweets, desserts, sugars, and sugary drinks.  Choose  heart-healthy fats.  Limit cheese to 1 oz (28 g) per day.  Eat more home-cooked food and less restaurant, buffet, and fast food.  Limit fried foods.  Cook foods using methods other than frying.  Limit canned vegetables. If you do use them, rinse them well to decrease the sodium.  When eating at a restaurant, ask that your food be prepared with less salt, or no salt if possible. WHAT FOODS CAN I EAT? Seek help from a dietitian for individual calorie needs. Grains Whole grain or whole wheat bread. Brown rice. Whole grain or whole wheat pasta. Quinoa, bulgur, and whole grain cereals. Low-sodium cereals. Corn or whole wheat flour tortillas. Whole grain cornbread. Whole grain crackers. Low-sodium crackers. Vegetables Fresh or frozen vegetables (raw, steamed, roasted, or grilled). Low-sodium or reduced-sodium  tomato and vegetable juices. Low-sodium or reduced-sodium tomato sauce and paste. Low-sodium or reduced-sodium canned vegetables.  Fruits All fresh, canned (in natural juice), or frozen fruits. Meat and Other Protein Products Ground beef (85% or leaner), grass-fed beef, or beef trimmed of fat. Skinless chicken or [place]. Ground chicken or [place]. Pork trimmed of fat. All fish and seafood. Eggs. Dried beans, peas, or lentils. Unsalted nuts and seeds. Unsalted canned beans. Dairy Low-fat dairy products, such as skim or 1% milk, 2% or reduced-fat cheeses, low-fat ricotta or cottage cheese, or plain low-fat yogurt. Low-sodium or reduced-sodium cheeses. Fats and Oils Tub margarines without trans fats. Light or reduced-fat mayonnaise and salad dressings (reduced sodium). Avocado. Safflower, olive, or canola oils. Natural peanut or almond butter. Other Unsalted popcorn and pretzels. The items listed above may not be a complete list of recommended foods or beverages. Contact your dietitian for more options. WHAT FOODS ARE NOT RECOMMENDED? Grains White bread. White pasta. White rice. Refined  cornbread. Bagels and croissants. Crackers that contain trans fat. Vegetables Creamed or fried vegetables. Vegetables in a cheese sauce. Regular canned vegetables. Regular canned tomato sauce and paste. Regular tomato and vegetable juices. Fruits Dried fruits. Canned fruit in light or heavy syrup. Fruit juice. Meat and Other Protein Products Fatty cuts of meat. Ribs, chicken wings, bacon, sausage, bologna, salami, chitterlings, fatback, hot dogs, bratwurst, and packaged luncheon meats. Salted nuts and seeds. Canned beans with salt. Dairy Whole or 2% milk, cream, half-and-half, and cream cheese. Whole-fat or sweetened yogurt. Full-fat cheeses or blue cheese. Nondairy creamers and whipped toppings. Processed cheese, cheese spreads, or cheese curds. Condiments Onion and garlic salt, seasoned salt, table salt, and sea salt. Canned and packaged gravies. Worcestershire sauce. Tartar sauce. Barbecue sauce. Teriyaki sauce. Soy sauce, including reduced sodium. Steak sauce. Fish sauce. Oyster sauce. Cocktail sauce. Horseradish. Ketchup and mustard. Meat flavorings and tenderizers. Bouillon cubes. Hot sauce. Tabasco sauce. Marinades. Taco seasonings. Relishes. Fats and Oils Butter, stick margarine, lard, shortening, ghee, and bacon fat. Coconut, palm kernel, or palm oils. Regular salad dressings. Other Pickles and olives. Salted popcorn and pretzels. The items listed above may not be a complete list of foods and beverages to avoid. Contact your dietitian for more information. WHERE CAN I FIND MORE INFORMATION? National Heart, Lung, and Blood Institute: travelstabloid.com   This information is not intended to replace advice given to you by your health care provider. Make sure you discuss any questions you have with your health care provider.   Document Released: 01/31/2011 Document Revised: 03/04/2014 Document Reviewed: 12/16/2012 Elsevier Interactive Patient Education  [employer].

## 2015-03-03 NOTE — Progress Notes (Signed)
Subjective:    Patient ID: Victoria Padilla, female    DOB: [birth year], 50 y.o.   MRN: [id number]  HPI Chief Complaint  Patient presents with  . new pt    new pt- diabetic but hasnt been checked in over a year due to no insurance. shoulder and hip pain   She is new to the practice and here to establish primary care. Her previous PCP, [facility]. She has not been there in more than a year due to lack of insurance She has not been taking blood pressure, cholesterol, or diabetes medications since June 2015 per patient.  PMH: Uncontrolled diabetes, type 2, diagnosed in 2012. She states she does not want to take insulin daily, requests pills for therapy. States this was a big reason for non compliance.  Last A1C in chart was 2014 and it was 12.5. No A1C since.  Has not checked blood sugars at home in over a year Has meter at home but states her supplies are out of date.  Diet: cooks healthy at home, variety of foods, likes pasta and rice. Loves potato chips.  Exercise: not currently  Eye exam: last time 2014 Foot issues: she checks them and no issues States she has never seen a nutritionist.   Checks blood pressures at home and readings have been high per patient 146/99 Acid reflux- no issues currently and not taking medications.  History of anemia. Takes multivitamin occasionally.   Social history: never smoked, denies alcohol, denies drug use Lives with 3 children 41, 20, 25  Other providers: Dr. [facility]ver[facility] Eyes: Dr Einar Gip   Review of Systems Pertinent positives and negatives in history of present illness.    Objective:   Physical Exam BP 140/98 mmHg  Pulse 68  Wt 253 lb (114.76 kg)  LMP 02/28/2015  Alert and in no distress.  Cardiac exam shows a regular sinus rhythm without murmurs or gallops. Lungs are clear to auscultation.  Hemoglobin A1c 13.1    Assessment & Plan:  Uncontrolled type 2 diabetes mellitus without complication, without long-term  current use of insulin (HCC) - Plan: POCT glycosylated hemoglobin (Hb A1C), CBC with Differential/Platelet, Comprehensive metabolic panel, Lipid panel, metFORMIN (GLUCOPHAGE) 1000 MG tablet, canagliflozin (INVOKANA) 100 MG TABS tablet, Amb ref to Medical Nutrition Therapy-M[facility]  Encounter to establish care  Essential hypertension - Plan: CBC with Differential/Platelet, Comprehensive metabolic panel, hydrochlorothiazide (HYDRODIURIL) 25 MG tablet, benazepril (LOTENSIN) 20 MG tablet, Amb ref to Medical Nutrition Therapy-[facility]  Hyperlipidemia associated with type 2 diabetes mellitus ([place]) - Plan: Lipid panel  History of anemia  Morbid obesity, unspecified obesity type ([place]) - Plan: Amb ref to Medical Nutrition Therapy-MNT  Discussed in depth the diabetes spectrum and encouraged her to start taking better care of herself by eating healthy, reducing carbs and sugars in diet and starting to walk 10-15 mins per day and gradually increasing this. Discussed that lifestyle modifications are the foundation for all of her diagnoses, particularly obesity, and that these modifications and medications can help to control illnesses but not cure them. Will refer to nutritionist. She agreed to go.  DM: Refilled Metformin. Prescribed and provided samples of Invokana and Trulicity. She agreed to once weekly injections and she did a return demonstration in the office on how to administer these injections per [employer], CMA. This may be beneficial for weight loss as well. Gabriel Cirri also provided her with meter and supplies and instructions for use. Recommend she check her blood sugars twice daily, once  fasting in morning and again 2 hours after a meal and she will bring these numbers to her appointment next week. Will need to check kidney function and adjust medications accordingly. Labs ordered.  HTN: DASH diet discussed, HCTZ and Benazapril refilled. She reported good blood pressure control when taking these in  past. Hyperlipidemia: will check lipids.  Anemia: checking labs. Continue multivitamin Follow up in 1 week and pending lab results.  She will need an eye exam soon.

## 2015-03-06 LAB — COMPLETE METABOLIC PANEL WITH GFR
ALT: 24 U/L (ref 6–29)
AST: 19 U/L (ref 10–35)
Albumin: 3.5 g/dL — ABNORMAL LOW (ref 3.6–5.1)
Alkaline Phosphatase: 107 U/L (ref 33–130)
BUN: 11 mg/dL (ref 7–25)
CHLORIDE: 102 mmol/L (ref 98–110)
CO2: 20 mmol/L (ref 20–31)
Calcium: 9.4 mg/dL (ref 8.6–10.4)
Creat: 0.68 mg/dL (ref 0.50–1.05)
GLUCOSE: 260 mg/dL — AB (ref 65–99)
Potassium: 4.6 mmol/L (ref 3.5–5.3)
SODIUM: 139 mmol/L (ref 135–146)
Total Bilirubin: 0.2 mg/dL (ref 0.2–1.2)
Total Protein: 6.8 g/dL (ref 6.1–8.1)

## 2015-03-10 ENCOUNTER — Ambulatory Visit (INDEPENDENT_AMBULATORY_CARE_PROVIDER_SITE_OTHER): Payer: BLUE CROSS/BLUE SHIELD | Admitting: Family Medicine

## 2015-03-10 ENCOUNTER — Encounter: Payer: Self-pay | Admitting: Family Medicine

## 2015-03-10 VITALS — BP 126/76 | HR 64 | Wt 246.0 lb

## 2015-03-10 DIAGNOSIS — I10 Essential (primary) hypertension: Secondary | ICD-10-CM | POA: Diagnosis not present

## 2015-03-10 DIAGNOSIS — E1165 Type 2 diabetes mellitus with hyperglycemia: Secondary | ICD-10-CM

## 2015-03-10 DIAGNOSIS — IMO0001 Reserved for inherently not codable concepts without codable children: Secondary | ICD-10-CM

## 2015-03-10 DIAGNOSIS — K219 Gastro-esophageal reflux disease without esophagitis: Secondary | ICD-10-CM | POA: Diagnosis not present

## 2015-03-10 DIAGNOSIS — E669 Obesity, unspecified: Secondary | ICD-10-CM

## 2015-03-10 MED ORDER — OMEPRAZOLE 40 MG PO CPDR: 40.0000 mg | DELAYED_RELEASE_CAPSULE | Freq: Every day | ORAL | Status: DC

## 2015-03-10 NOTE — Progress Notes (Signed)
   Subjective:    Patient ID: Victoria Padilla, female    DOB: [birth year], 50 y.o.   MRN: [id number]  HPI Chief Complaint  Patient presents with  . 1 week follow-up    1 week follow-up. BS running high. ranging 124-313.have not felt well since friday and she isnt sure if its the med. she is having heartburn and nausea   She is here for diabetes follow up to assess blood sugars and to see how she is doing on new medications. Last week her hemoglobin A1C was 13.1. She is taking Metformin 1000mg  2 x daily, Invokana once daily before breakfast and Trulicity weekly. She has had one injection of Trulicity and second injection is due today. She does report mild nausea. States her morning fasting blood sugars have been between 124 and 199. She has not been taking her blood sugar often in the evenings when she has they were as high as 313. She reports a history of GERD and states that the past week she has had reflux symptoms. She is requesting refill of her reflux medication.  Review of Systems Pertinent positives and negatives in the history of present illness.    Objective:   Physical Exam BP 126/76 mmHg  Pulse 64  Wt 246 lb (111.585 kg)  LMP 02/28/2015  Alert and oriented and no acute distress. Not otherwise examined.      Assessment & Plan:  Uncontrolled type 2 diabetes mellitus without complication, without long-term current use of insulin (HCC)  Essential hypertension  Obesity  Gastroesophageal reflux disease, esophagitis presence not specified  Refilled Omeprazole, she has not taken this in several weeks. She is having reflux symptoms. Discussed gerd management. She will let me know if this worsens or if medication is not improving symptoms along with lifestyle modifications.  She will return phone call to nutritionist to schedule appointment. She has not been counting carbs or watching food intake. Has not been exercising, encouraged her to do this.  Blood pressure is within goal  today, no changes to blood pressure medication. Furosemide not refilled, she was taking this for LE edema and has not had issues with this recently so we will hold off on this.  She will follow up in 2 weeks for Diabetes and GERD. Recommend giving current diabetes medications longer to work. She will take 2nd Trulicity injection today. Blood sugars are down from last week but still far from goal. She will continue to check daily blood sugars and bring these numbers to her appointment in 2 weeks. She will try to make improvements with lifestyle modifications.

## 2015-03-10 NOTE — Patient Instructions (Addendum)
Continue checking your blood sugar each morning fasting and 2 hours after lunch and supper. Bring in your numbers in 2 weeks and let's see how you are doing with your current regimen.  Call the nutritionist back and schedule appointment.  Call for eye exam in next couple months.  Start exercising slowing.   Basic Carbohydrate Counting for Diabetes Mellitus Carbohydrate counting is a method for keeping track of the amount of carbohydrates you eat. Eating carbohydrates naturally increases the level of sugar (glucose) in your blood, so it is important for you to know the amount that is okay for you to have in every meal. Carbohydrate counting helps keep the level of glucose in your blood within normal limits. The amount of carbohydrates allowed is different for every person. A dietitian can help you calculate the amount that is right for you. Once you know the amount of carbohydrates you can have, you can count the carbohydrates in the foods you want to eat. Carbohydrates are found in the following foods:  Grains, such as breads and cereals.  Dried beans and soy products.  Starchy vegetables, such as potatoes, peas, and corn.  Fruit and fruit juices.  Milk and yogurt.  Sweets and snack foods, such as cake, cookies, candy, chips, soft drinks, and fruit drinks. CARBOHYDRATE COUNTING There are two ways to count the carbohydrates in your food. You can use either of the methods or a combination of both. Reading the "Nutrition Facts" on [place] The "Nutrition Facts" is an area that is included on the labels of almost all packaged food and beverages in the [place]. It includes the serving size of that food or beverage and information about the nutrients in each serving of the food, including the grams (g) of carbohydrate per serving.  Decide the number of servings of this food or beverage that you will be able to eat or drink. Multiply that number of servings by the number of grams of  carbohydrate that is listed on the label for that serving. The total will be the amount of carbohydrates you will be having when you eat or drink this food or beverage. Learning Standard Serving Sizes of Food When you eat food that is not packaged or does not include "Nutrition Facts" on the label, you need to measure the servings in order to count the amount of carbohydrates.A serving of most carbohydrate-rich foods contains about 15 g of carbohydrates. The following list includes serving sizes of carbohydrate-rich foods that provide 15 g ofcarbohydrate per serving:   1 slice of bread (1 oz) or 1 six-inch tortilla.    of a hamburger bun or English muffin.  4-6 crackers.   cup unsweetened dry cereal.    cup hot cereal.   cup rice or pasta.    cup mashed potatoes or  of a large baked potato.  1 cup fresh fruit or one small piece of fruit.    cup canned or frozen fruit or fruit juice.  1 cup milk.   cup plain fat-free yogurt or yogurt sweetened with artificial sweeteners.   cup cooked dried beans or starchy vegetable, such as peas, corn, or potatoes.  Decide the number of standard-size servings that you will eat. Multiply that number of servings by 15 (the grams of carbohydrates in that serving). For example, if you eat 2 cups of strawberries, you will have eaten 2 servings and 30 g of carbohydrates (2 servings x 15 g = 30 g). For foods such as soups  and casseroles, in which more than one food is mixed in, you will need to count the carbohydrates in each food that is included. EXAMPLE OF CARBOHYDRATE COUNTING Sample Dinner  3 oz chicken breast.   cup of brown rice.   cup of corn.  1 cup milk.   1 cup strawberries with sugar-free whipped topping.  Carbohydrate Calculation Step 1: Identify the foods that contain carbohydrates:   Rice.   Corn.   Milk.   Strawberries. Step 2:Calculate the number of servings eaten of each:   2 servings of rice.    1 serving of corn.   1 serving of milk.   1 serving of strawberries. Step 3: Multiply each of those number of servings by 15 g:   2 servings of rice x 15 g = 30 g.   1 serving of corn x 15 g = 15 g.   1 serving of milk x 15 g = 15 g.   1 serving of strawberries x 15 g = 15 g. Step 4: Add together all of the amounts to find the total grams of carbohydrates eaten: 30 g + 15 g + 15 g + 15 g = 75 g.   This information is not intended to replace advice given to you by your health care provider. Make sure you discuss any questions you have with your health care provider.   Document Released: 02/11/2005 Document Revised: 03/04/2014 Document Reviewed: 01/08/2013 Elsevier Interactive Patient Education 2016 [employer]. Gastroesophageal Reflux Disease, Adult Normally, food travels down the esophagus and stays in the stomach to be digested. However, when a person has gastroesophageal reflux disease (GERD), food and stomach acid move back up into the esophagus. When this happens, the esophagus becomes sore and inflamed. Over time, GERD can create small holes (ulcers) in the lining of the esophagus.  CAUSES This condition is caused by a problem with the muscle between the esophagus and the stomach (lower esophageal sphincter, or LES). Normally, the LES muscle closes after food passes through the esophagus to the stomach. When the LES is weakened or abnormal, it does not close properly, and that allows food and stomach acid to go back up into the esophagus. The LES can be weakened by certain dietary substances, medicines, and medical conditions, including:  Tobacco use.  Pregnancy.  Having a hiatal hernia.  Heavy alcohol use.  Certain foods and beverages, such as coffee, chocolate, onions, and peppermint. RISK FACTORS This condition is more likely to develop in:  People who have an increased body weight.  People who have connective tissue disorders.  People who use NSAID  medicines. SYMPTOMS Symptoms of this condition include:  Heartburn.  Difficult or painful swallowing.  The feeling of having a lump in the throat.  Abitter taste in the mouth.  Bad breath.  Having a large amount of saliva.  Having an upset or bloated stomach.  Belching.  Chest pain.  Shortness of breath or wheezing.  Ongoing (chronic) cough or a night-time cough.  Wearing away of tooth enamel.  Weight loss. Different conditions can cause chest pain. Make sure to see your health care provider if you experience chest pain. DIAGNOSIS Your health care provider will take a medical history and perform a physical exam. To determine if you have mild or severe GERD, your health care provider may also monitor how you respond to treatment. You may also have other tests, including:  An endoscopy toexamine your stomach and esophagus with a small camera.  A  test thatmeasures the acidity level in your esophagus.  A test thatmeasures how much pressure is on your esophagus.  A barium swallow or modified barium swallow to show the shape, size, and functioning of your esophagus. TREATMENT The goal of treatment is to help relieve your symptoms and to prevent complications. Treatment for this condition may vary depending on how severe your symptoms are. Your health care provider may recommend:  Changes to your diet.  Medicine.  Surgery. HOME CARE INSTRUCTIONS Diet  Follow a diet as recommended by your health care provider. This may involve avoiding foods and drinks such as:  Coffee and tea (with or without caffeine).  Drinks that containalcohol.  Energy drinks and sports drinks.  Carbonated drinks or sodas.  Chocolate and cocoa.  Peppermint and mint flavorings.  Garlic and onions.  Horseradish.  Spicy and acidic foods, including peppers, chili powder, curry powder, vinegar, hot sauces, and barbecue sauce.  Citrus fruit juices and citrus fruits, such as oranges,  lemons, and limes.  Tomato-based foods, such as red sauce, chili, salsa, and pizza with red sauce.  Fried and fatty foods, such as donuts, french fries, potato chips, and high-fat dressings.  High-fat meats, such as hot dogs and fatty cuts of red and white meats, such as rib eye steak, sausage, ham, and bacon.  High-fat dairy items, such as whole milk, butter, and cream cheese.  Eat small, frequent meals instead of large meals.  Avoid drinking large amounts of liquid with your meals.  Avoid eating meals during the 2-3 hours before bedtime.  Avoid lying down right after you eat.  Do not exercise right after you eat. General Instructions  Pay attention to any changes in your symptoms.  Take over-the-counter and prescription medicines only as told by your health care provider. Do not take aspirin, ibuprofen, or other NSAIDs unless your health care provider told you to do so.  Do not use any tobacco products, including cigarettes, chewing tobacco, and e-cigarettes. If you need help quitting, ask your health care provider.  Wear loose-fitting clothing. Do not wear anything tight around your waist that causes pressure on your abdomen.  Raise (elevate) the head of your bed 6 inches (15cm).  Try to reduce your stress, such as with yoga or meditation. If you need help reducing stress, ask your health care provider.  If you are overweight, reduce your weight to an amount that is healthy for you. Ask your health care provider for guidance about a safe weight loss goal.  Keep all follow-up visits as told by your health care provider. This is important. SEEK MEDICAL CARE IF:  You have new symptoms.  You have unexplained weight loss.  You have difficulty swallowing, or it hurts to swallow.  You have wheezing or a persistent cough.  Your symptoms do not improve with treatment.  You have a hoarse voice. SEEK IMMEDIATE MEDICAL CARE IF:  You have pain in your arms, neck, jaw,  teeth, or back.  You feel sweaty, dizzy, or light-headed.  You have chest pain or shortness of breath.  You vomit and your vomit looks like blood or coffee grounds.  You faint.  Your stool is bloody or black.  You cannot swallow, drink, or eat.   This information is not intended to replace advice given to you by your health care provider. Make sure you discuss any questions you have with your health care provider.   Document Released: 11/21/2004 Document Revised: 11/02/2014 Document Reviewed: 06/08/2014 Elsevier Interactive Patient  Education 2016 [employer].

## 2015-03-21 ENCOUNTER — Telehealth: Payer: Self-pay | Admitting: Internal Medicine

## 2015-03-21 MED ORDER — GLUCOSE BLOOD VI STRP: ORAL_STRIP | Status: DC

## 2015-03-21 NOTE — Telephone Encounter (Signed)
Pharmacy called and states that pt has a contour next meter and needs contour next test strips sent to pharmacy

## 2015-03-24 ENCOUNTER — Ambulatory Visit (INDEPENDENT_AMBULATORY_CARE_PROVIDER_SITE_OTHER): Payer: BLUE CROSS/BLUE SHIELD | Admitting: Family Medicine

## 2015-03-24 ENCOUNTER — Encounter: Payer: Self-pay | Admitting: Family Medicine

## 2015-03-24 VITALS — BP 140/82 | HR 64 | Wt 243.0 lb

## 2015-03-24 DIAGNOSIS — IMO0001 Reserved for inherently not codable concepts without codable children: Secondary | ICD-10-CM

## 2015-03-24 DIAGNOSIS — E1165 Type 2 diabetes mellitus with hyperglycemia: Secondary | ICD-10-CM | POA: Diagnosis not present

## 2015-03-24 DIAGNOSIS — R110 Nausea: Secondary | ICD-10-CM | POA: Diagnosis not present

## 2015-03-24 LAB — GLUCOSE, POCT (MANUAL RESULT ENTRY): POC GLUCOSE: 114 mg/dL — AB (ref 70–99)

## 2015-03-24 MED ORDER — GLUCOSE BLOOD VI STRP: ORAL_STRIP | Status: DC

## 2015-03-24 MED ORDER — CANAGLIFLOZIN 100 MG PO TABS: 100.0000 mg | ORAL_TABLET | Freq: Every day | ORAL | Status: DC

## 2015-03-24 NOTE — Progress Notes (Signed)
Subjective:    Patient ID: Victoria Padilla, female    DOB: 1965/10/16, 50 y.o.   MRN: [id number]  HPI Chief Complaint  Patient presents with  . follow-up    2 week follow-up. not testing due to no strips. resent test strips today. out of invokana and truilicity     She is here for a follow up of blood sugars and medications. She was without her medications for about a year at our first visit on 03/03/2015. Her A1C at that time was 13.1%. She is taking Metformin 1000mg  bid, Invokana once daily with breakfast and Trulicity once weekly (on Fridays). She states she ran out of her medications yesterday, she was given samples of invokana and Trulicity at her last visit.  She was not aware that prescriptions were called in to her pharmacy.  She has not been checking her blood sugars and states she was not able to pick up her test strips. She did not let our office know that she was having difficulty obtaining these.   She states she has been walking 3-4 blocks 3 days per week. She has a sedentary job and is trying to walk on her breaks. She reports nausea that lasts for about 4 days after taking Trulicity and then improves for 2 days. She is tolerating this. Would like to stay on medication for now.  10 lbs weight loss and she is happy about this.  Has appointment with nutritionist in February. She denies polyuria, polydipsia, chest pain, palpitations, DOE, lower extremity edema. States she feels fine other than the nausea.   Review of Systems Pertinent positives and negatives in the history of present illness.     Objective:   Physical Exam BP 140/82 mmHg  Pulse 64  Wt 243 lb (110.224 kg)  LMP 02/28/2015  Alert and oriented and in no acute distress. Not otherwise examined. Fasting blood sugar 114.      Assessment & Plan:  Uncontrolled type 2 diabetes mellitus without complication, without long-term current use of insulin (HCC) - Plan: canagliflozin (INVOKANA) 100 MG TABS tablet, POCT  glucose (manual entry)  Nausea without vomiting  Patient encouraged to let us know if she is having difficulty obtaining her medications or test strips. Discussed that we do not want her to be out of her medications or not have the ability to test her blood sugar at home. Her fasting blood sugar today is 114. She has lost 10 pounds since her initial visit. Discussed that her medications appear to be working. She is having some difficulty with nausea and this is most likely related to Trulicity. Discussed switching medications, however, she states would like to continue on this medication for a couple more weeks and see if it improves. Recommend trying some ginger to help with nausea. Recommend that she get her test strips and medication refills today. Start checking her blood sugar daily fasting and then 2 hours after a meal at least 3-4 days per week and report back any low readings or readings <80.  Recommend that she increase her walking or find an exercise such as stationary bike or water aerobics that she enjoys. Discussed being physically active at least 150 minutes per week. She has not been watching her diet but has had some decreased intake due to nausea. I strongly encouraged her to keep the appointment with the nutritionist next month to help her with food choices. Discussed that if her blood sugar continues to be in a good range we  may consider stopping Trulicity.  She is overdue for a complete physical exam. Recommend she schedule this in the next couple of months.  She will follow-up in one month for diabetes check. Discussed that once we get her blood sugar under control that we will go longer between appointments.

## 2015-03-24 NOTE — Patient Instructions (Signed)
Check your blood sugars once daily fasting and then 2 hours after a meal at least 3 times weekly and keep a record. Return in 1 month unless you are havening problems with your medications or blood sugar is not within range.  Fasting blood sugar 90-120 and 2 hours after a meal 130-160. Report any low blood sugars <80 to [place].  Schedule a physical exam and fasting blood work within the next couple of months.  Keep your appointment with your nutritionist.   Basic Carbohydrate Counting for Diabetes Mellitus Carbohydrate counting is a method for keeping track of the amount of carbohydrates you eat. Eating carbohydrates naturally increases the level of sugar (glucose) in your blood, so it is important for you to know the amount that is okay for you to have in every meal. Carbohydrate counting helps keep the level of glucose in your blood within normal limits. The amount of carbohydrates allowed is different for every person. A dietitian can help you calculate the amount that is right for you. Once you know the amount of carbohydrates you can have, you can count the carbohydrates in the foods you want to eat. Carbohydrates are found in the following foods:  Grains, such as breads and cereals.  Dried beans and soy products.  Starchy vegetables, such as potatoes, peas, and corn.  Fruit and fruit juices.  Milk and yogurt.  Sweets and snack foods, such as cake, cookies, candy, chips, soft drinks, and fruit drinks. CARBOHYDRATE COUNTING There are two ways to count the carbohydrates in your food. You can use either of the methods or a combination of both. Reading the "Nutrition Facts" on [place] The "Nutrition Facts" is an area that is included on the labels of almost all packaged food and beverages in the [place]. It includes the serving size of that food or beverage and information about the nutrients in each serving of the food, including the grams (g) of carbohydrate per serving.  Decide  the number of servings of this food or beverage that you will be able to eat or drink. Multiply that number of servings by the number of grams of carbohydrate that is listed on the label for that serving. The total will be the amount of carbohydrates you will be having when you eat or drink this food or beverage. Learning Standard Serving Sizes of Food When you eat food that is not packaged or does not include "Nutrition Facts" on the label, you need to measure the servings in order to count the amount of carbohydrates.A serving of most carbohydrate-rich foods contains about 15 g of carbohydrates. The following list includes serving sizes of carbohydrate-rich foods that provide 15 g ofcarbohydrate per serving:   1 slice of bread (1 oz) or 1 six-inch tortilla.    of a hamburger bun or English muffin.  4-6 crackers.   cup unsweetened dry cereal.    cup hot cereal.   cup rice or pasta.    cup mashed potatoes or  of a large baked potato.  1 cup fresh fruit or one small piece of fruit.    cup canned or frozen fruit or fruit juice.  1 cup milk.   cup plain fat-free yogurt or yogurt sweetened with artificial sweeteners.   cup cooked dried beans or starchy vegetable, such as peas, corn, or potatoes.  Decide the number of standard-size servings that you will eat. Multiply that number of servings by 15 (the grams of carbohydrates in that serving). For example, if  you eat 2 cups of strawberries, you will have eaten 2 servings and 30 g of carbohydrates (2 servings x 15 g = 30 g). For foods such as soups and casseroles, in which more than one food is mixed in, you will need to count the carbohydrates in each food that is included. EXAMPLE OF CARBOHYDRATE COUNTING Sample Dinner  3 oz chicken breast.   cup of brown rice.   cup of corn.  1 cup milk.   1 cup strawberries with sugar-free whipped topping.  Carbohydrate Calculation Step 1: Identify the foods that contain  carbohydrates:   Rice.   Corn.   Milk.   Strawberries. Step 2:Calculate the number of servings eaten of each:   2 servings of rice.   1 serving of corn.   1 serving of milk.   1 serving of strawberries. Step 3: Multiply each of those number of servings by 15 g:   2 servings of rice x 15 g = 30 g.   1 serving of corn x 15 g = 15 g.   1 serving of milk x 15 g = 15 g.   1 serving of strawberries x 15 g = 15 g. Step 4: Add together all of the amounts to find the total grams of carbohydrates eaten: 30 g + 15 g + 15 g + 15 g = 75 g.   This information is not intended to replace advice given to you by your health care provider. Make sure you discuss any questions you have with your health care provider.   Document Released: 02/11/2005 Document Revised: 03/04/2014 Document Reviewed: 01/08/2013 Elsevier Interactive Patient Education [employer].

## 2015-03-28 ENCOUNTER — Telehealth: Payer: Self-pay | Admitting: Family Medicine

## 2015-03-28 NOTE — Telephone Encounter (Signed)
Left message for pt, Trulicity requiring P.A.  Initiated P.A. Thru [employer]

## 2015-03-31 ENCOUNTER — Telehealth: Payer: Self-pay | Admitting: Family Medicine

## 2015-03-31 NOTE — Telephone Encounter (Signed)
Pt states we didn't order the correct test strips, said should be  [employer] Contour Next EZ strips.  I called the pharmacy and pharmacist says pt has an EX Meter but the strips do not say EZ on the box but it is the correct one Left message for pt

## 2015-03-31 NOTE — Telephone Encounter (Signed)
P.A. [facility] for Trulicity.  Pt must try and fail Victoza.  Pt informed, but states she does not want to do daily injections so if this is daily, she wants to just go back on the pill and not be on insulin at all.   Please advise pt

## 2015-04-03 NOTE — Telephone Encounter (Signed)
Called and left her a message to call us and let us know what her blood sugars have been running at home. She should call back with these readings. I may consider adding a 3rd pill to her current diabetes treatment plan or if her blood sugars can be managed with the 2 pills she is on we can just do this.  Thanks.

## 2015-04-05 ENCOUNTER — Telehealth: Payer: Self-pay | Admitting: Family Medicine

## 2015-04-05 NOTE — Telephone Encounter (Signed)
Pt was notified to call insurance company to see if other 2 injectionable will be covered by insurance. Pt will call us back

## 2015-04-05 NOTE — Telephone Encounter (Signed)
Patient called to let me know that her fasting blood sugar today was 195 and that her blood sugar 2 hours after supper yesterday was 220. She is currently taking metformin 1000 mg twice a day and Invokana 100 mg daily. She was using Trulicity samples and was prescribed Trulicity however her insurance denied this medication. She does not want to take daily injections, states she has taken Lantus daily in the past and did not like this and states she refuses to manage daily injections.

## 2015-04-05 NOTE — Telephone Encounter (Signed)
Pt called back and said that a PA would be required for bydureon and tanzeum and they recommended doing a daily injection called victoza but she does not want to do that. Pt is going to take fasting bs tomorrow morning, Friday morning and then 2 hours after dinner tonight and 2 hours after lunch tomorrow and then call us Friday morning with an update to let us know what the next plan is.

## 2015-04-11 ENCOUNTER — Telehealth: Payer: Self-pay | Admitting: Family Medicine

## 2015-04-11 NOTE — Telephone Encounter (Signed)
Pt called with blood sugar readings 2/9 am fasting 180, at 10:30 after she ate was 327 2/10 am 174 fasting  2/11 after eating 210  2/12 am fasting 143, after dinner 232  2/13 am fasting 148, after dinner 204  2/14 am fasting 147. After ate 207  Please call pt and advise pt

## 2015-04-12 MED ORDER — GLIPIZIDE 5 MG PO TABS: 5.0000 mg | ORAL_TABLET | Freq: Every day | ORAL | Status: DC

## 2015-04-12 NOTE — Telephone Encounter (Signed)
Pt was notified and i have sent med into pharmacy and she will come back in 2 weeks from today

## 2015-04-12 NOTE — Telephone Encounter (Signed)
Since her blood sugars are still higher than recommended, we can try adding a third oral medication. Is she ok with this since she has refused to take a daily injection?   If so, I recommend taking glipizide ONCE daily for the first 2 weeks and see how she does on this.  She will need to take it with breakfast and she needs to eat when she takes this medication. One big side effect of this medication is that it can cause low blood sugars and she needs to be aware of this and check her blood sugar if she is feeling tired, sweaty, jittery and have a piece of hard candy or juice on hand in case her blood sugar is <70.  I do not expect this to happen but she should be aware.   I would like to see her again in 2 weeks and see her readings.  Continue checking daily with breakfast and 2 hours after lunch or supper (mix it up).  Let me know if she wants to try this and I will send in medicine. thanks

## 2015-04-12 NOTE — Telephone Encounter (Signed)
Left message for pt to call me back 

## 2015-04-13 ENCOUNTER — Encounter: Payer: Self-pay | Admitting: Skilled Nursing Facility1

## 2015-04-13 ENCOUNTER — Encounter: Payer: BLUE CROSS/BLUE SHIELD | Attending: Family Medicine | Admitting: Skilled Nursing Facility1

## 2015-04-19 ENCOUNTER — Ambulatory Visit (INDEPENDENT_AMBULATORY_CARE_PROVIDER_SITE_OTHER): Payer: BLUE CROSS/BLUE SHIELD | Admitting: Family Medicine

## 2015-04-19 ENCOUNTER — Ambulatory Visit
Admission: RE | Admit: 2015-04-19 | Discharge: 2015-04-19 | Disposition: A | Discharge status: Home / Self Care | Payer: BLUE CROSS/BLUE SHIELD | Source: Ambulatory Visit | Attending: Family Medicine | Admitting: Family Medicine

## 2015-04-19 ENCOUNTER — Encounter: Payer: Self-pay | Admitting: Family Medicine

## 2015-04-19 VITALS — BP 122/82 | HR 68 | Ht 63.0 in | Wt 240.4 lb

## 2015-04-19 DIAGNOSIS — M25511 Pain in right shoulder: Secondary | ICD-10-CM

## 2015-04-19 DIAGNOSIS — I10 Essential (primary) hypertension: Secondary | ICD-10-CM

## 2015-04-19 DIAGNOSIS — IMO0001 Reserved for inherently not codable concepts without codable children: Secondary | ICD-10-CM

## 2015-04-19 DIAGNOSIS — Z1239 Encounter for other screening for malignant neoplasm of breast: Secondary | ICD-10-CM

## 2015-04-19 DIAGNOSIS — Z1159 Encounter for screening for other viral diseases: Secondary | ICD-10-CM

## 2015-04-19 DIAGNOSIS — Z113 Encounter for screening for infections with a predominantly sexual mode of transmission: Secondary | ICD-10-CM

## 2015-04-19 DIAGNOSIS — Z23 Encounter for immunization: Secondary | ICD-10-CM

## 2015-04-19 DIAGNOSIS — E1165 Type 2 diabetes mellitus with hyperglycemia: Secondary | ICD-10-CM

## 2015-04-19 DIAGNOSIS — M7581 Other shoulder lesions, right shoulder: Secondary | ICD-10-CM | POA: Diagnosis not present

## 2015-04-19 DIAGNOSIS — Z0000 Encounter for general adult medical examination without abnormal findings: Secondary | ICD-10-CM | POA: Diagnosis not present

## 2015-04-19 DIAGNOSIS — L853 Xerosis cutis: Secondary | ICD-10-CM

## 2015-04-19 DIAGNOSIS — Z1211 Encounter for screening for malignant neoplasm of colon: Secondary | ICD-10-CM

## 2015-04-19 LAB — POCT URINALYSIS DIPSTICK
BILIRUBIN UA: NEGATIVE
Ketones, UA: NEGATIVE
LEUKOCYTES UA: NEGATIVE
NITRITE UA: NEGATIVE
PH UA: 6
PROTEIN UA: NEGATIVE
RBC UA: NEGATIVE
Spec Grav, UA: >=1.03
Urobilinogen, UA: NEGATIVE

## 2015-04-19 LAB — HEPATITIS C ANTIBODY: HCV Ab: NEGATIVE

## 2015-04-19 MED ORDER — LIDOCAINE HCL (PF) 2 % IJ SOLN
3.0000 mL | Freq: Once | INTRAMUSCULAR | Status: AC
  Administered 2015-04-19: 3 mL

## 2015-04-19 MED ORDER — TRIAMCINOLONE ACETONIDE 40 MG/ML IJ SUSP
40.0000 mg | Freq: Once | INTRAMUSCULAR | Status: AC
  Administered 2015-04-19: 40 mg via INTRAMUSCULAR

## 2015-04-19 NOTE — Patient Instructions (Addendum)
Start taking the glipizide and continue checking her blood sugars twice daily, once in the morning fasting and then 2 hours after lunch or supper. Make sure you are eating within 30 minutes of taking the glipizide. Call Monday and let us know what your blood sugar readings have been after starting the glipizide. Try Eucerin or a lotion that does not contain alcohol for the dry patches on your back. Let me know if this is not improving. Today you received your flu shot and Tdap.  Call and schedule your mammogram, the orders in the computer system. Call and check on your last colonoscopy in when you are due. Follow up with your gynecologist for Pap smear as discussed. Schedule your eye exam and dentist appointment.  We are screening your for Hepatitis C and HIV today, this is recommended as a one time screening.   Preventative Care for Adults - Female      MAINTAIN REGULAR HEALTH EXAMS:  A routine yearly physical is a good way to check in with your primary care provider about your health and preventive screening. It is also an opportunity to share updates about your health and any concerns you have, and receive a thorough all-over exam.   Most health insurance companies pay for at least some preventative services.  Check with your health plan for specific coverages.  WHAT PREVENTATIVE SERVICES DO WOMEN NEED?  Adult women should have their weight and blood pressure checked regularly.   Women age 24 and older should have their cholesterol levels checked regularly.  Women should be screened for cervical cancer with a Pap smear and pelvic exam beginning at either age 67, or 3 years after they become sexually activity.    Breast cancer screening generally begins at age 48 with a mammogram and breast exam by your primary care provider.    Beginning at age 91 and continuing to age 30, women should be screened for colorectal cancer.  Certain people may need continued testing until age  47.  Updating vaccinations is part of preventative care.  Vaccinations help protect against diseases such as the flu.  Osteoporosis is a disease in which the bones lose minerals and strength as we age. Women ages 30 and over should discuss this with their caregivers, as should women after menopause who have other risk factors.  Lab tests are generally done as part of preventative care to screen for anemia and blood disorders, to screen for problems with the kidneys and liver, to screen for bladder problems, to check blood sugar, and to check your cholesterol level.  Preventative services generally include counseling about diet, exercise, avoiding tobacco, drugs, excessive alcohol consumption, and sexually transmitted infections.    GENERAL RECOMMENDATIONS FOR GOOD HEALTH:  Healthy diet:  Eat a variety of foods, including fruit, vegetables, animal or vegetable protein, such as meat, fish, chicken, and eggs, or beans, lentils, tofu, and grains, such as rice.  Drink plenty of water daily.  Decrease saturated fat in the diet, avoid lots of red meat, processed foods, sweets, fast foods, and fried foods.  Exercise:  Aerobic exercise helps maintain good heart health. At least 30-40 minutes of moderate-intensity exercise is recommended. For example, a brisk walk that increases your heart rate and breathing. This should be done on most days of the week.   Find a type of exercise or a variety of exercises that you enjoy so that it becomes a part of your daily life.  Examples are running, walking, swimming, water aerobics,  and biking.  For motivation and support, explore group exercise such as aerobic class, spin class, Zumba, Yoga,or  martial arts, etc.    Set exercise goals for yourself, such as a certain weight goal, walk or run in a race such as a 5k walk/run.  Speak to your primary care provider about exercise goals.  Disease prevention:  If you smoke or chew tobacco, find out from your  caregiver how to quit. It can literally save your life, no matter how long you have been a tobacco user. If you do not use tobacco, never begin.   Maintain a healthy diet and normal weight. Increased weight leads to problems with blood pressure and diabetes.   The Body Mass Index or BMI is a way of measuring how much of your body is fat. Having a BMI above 27 increases the risk of heart disease, diabetes, hypertension, stroke and other problems related to obesity. Your caregiver can help determine your BMI and based on it develop an exercise and dietary program to help you achieve or maintain this important measurement at a healthful level.  High blood pressure causes heart and blood vessel problems.  Persistent high blood pressure should be treated with medicine if weight loss and exercise do not work.   Fat and cholesterol leaves deposits in your arteries that can block them. This causes heart disease and vessel disease elsewhere in your body.  If your cholesterol is found to be high, or if you have heart disease or certain other medical conditions, then you may need to have your cholesterol monitored frequently and be treated with medication.   Ask if you should have a cardiac stress test if your history suggests this. A stress test is a test done on a treadmill that looks for heart disease. This test can find disease prior to there being a problem.  Menopause can be associated with physical symptoms and risks. Hormone replacement therapy is available to decrease these. You should talk to your caregiver about whether starting or continuing to take hormones is right for you.   Osteoporosis is a disease in which the bones lose minerals and strength as we age. This can result in serious bone fractures. Risk of osteoporosis can be identified using a bone density scan. Women ages 18 and over should discuss this with their caregivers, as should women after menopause who have other risk factors. Ask your  caregiver whether you should be taking a calcium supplement and Vitamin D, to reduce the rate of osteoporosis.   Avoid drinking alcohol in excess (more than two drinks per day).  Avoid use of street drugs. Do not share needles with anyone. Ask for professional help if you need assistance or instructions on stopping the use of alcohol, cigarettes, and/or drugs.  Brush your teeth twice a day with fluoride toothpaste, and floss once a day. Good oral hygiene prevents tooth decay and gum disease. The problems can be painful, unattractive, and can cause other health problems. Visit your dentist for a routine oral and dental check up and preventive care every 6-12 months.   Look at your skin regularly.  Use a mirror to look at your back. Notify your caregivers of changes in moles, especially if there are changes in shapes, colors, a size larger than a pencil eraser, an irregular border, or development of new moles.  Safety:  Use seatbelts 100% of the time, whether driving or as a passenger.  Use safety devices such as hearing protection if you  work in environments with loud noise or significant background noise.  Use safety glasses when doing any work that could send debris in to the eyes.  Use a helmet if you ride a bike or motorcycle.  Use appropriate safety gear for contact sports.  Talk to your caregiver about gun safety.  Use sunscreen with a SPF (or skin protection factor) of 15 or greater.  Lighter skinned people are at a greater risk of skin cancer. Don't forget to also wear sunglasses in order to protect your eyes from too much damaging sunlight. Damaging sunlight can accelerate cataract formation.   Practice safe sex. Use condoms. Condoms are used for birth control and to help reduce the spread of sexually transmitted infections (or STIs).  Some of the STIs are gonorrhea (the clap), chlamydia, syphilis, trichomonas, herpes, HPV (human papilloma virus) and HIV (human immunodeficiency virus) which  causes AIDS. The herpes, HIV and HPV are viral illnesses that have no cure. These can result in disability, cancer and death.   Keep carbon monoxide and smoke detectors in your home functioning at all times. Change the batteries every 6 months or use a model that plugs into the wall.   Vaccinations:  Stay up to date with your tetanus shots and other required immunizations. You should have a booster for tetanus every 10 years. Be sure to get your flu shot every year, since 5%-20% of the [place] population comes down with the flu. The flu vaccine changes each year, so being vaccinated once is not enough. Get your shot in the fall, before the flu season peaks.   Other vaccines to consider:  Human Papilloma Virus or HPV causes cancer of the cervix, and other infections that can be transmitted from person to person. There is a vaccine for HPV, and females should get immunized between the ages of 19 and 72. It requires a series of 3 shots.   Pneumococcal vaccine to protect against certain types of pneumonia.  This is normally recommended for adults age 97 or older.  However, adults younger than 52 years old with certain underlying conditions such as diabetes, heart or lung disease should also receive the vaccine.  Shingles vaccine to protect against Varicella Zoster if you are older than age 3, or younger than 52 years old with certain underlying illness.  Hepatitis A vaccine to protect against a form of infection of the liver by a virus acquired from food.  Hepatitis B vaccine to protect against a form of infection of the liver by a virus acquired from blood or body fluids, particularly if you work in health care.  If you plan to travel internationally, check with your local health department for specific vaccination recommendations.  Cancer Screening:  Breast cancer screening is essential to preventive care for women. All women age 43 and older should perform a breast self-exam every month. At age  65 and older, women should have their caregiver complete a breast exam each year. Women at ages 42 and older should have a mammogram (x-ray film) of the breasts. Your caregiver can discuss how often you need mammograms.    Cervical cancer screening includes taking a Pap smear (sample of cells examined under a microscope) from the cervix (end of the uterus). It also includes testing for HPV (Human Papilloma Virus, which can cause cervical cancer). Screening and a pelvic exam should begin at age 59, or 3 years after a woman becomes sexually active. Screening should occur every year, with a Pap smear but  no HPV testing, up to age 24. After age 51, you should have a Pap smear every 3 years with HPV testing, if no HPV was found previously.   Most routine colon cancer screening begins at the age of 63. On a yearly basis, doctors may provide special easy to use take-home tests to check for hidden blood in the stool. Sigmoidoscopy or colonoscopy can detect the earliest forms of colon cancer and is life saving. These tests use a small camera at the end of a tube to directly examine the colon. Speak to your caregiver about this at age 10, when routine screening begins (and is repeated every 5 years unless early forms of pre-cancerous polyps or small growths are found).

## 2015-04-19 NOTE — Progress Notes (Signed)
   Subjective:    Patient ID: Victoria Padilla, female    DOB: [birth year], 50 y.o.   MRN: [id number]  HPI She continues to have right shoulder pain. She does have pain with abduction and external rotation.   Review of Systems     Objective:   Physical Exam Pain on motion of the shoulder. Neer's and Hawkins test positive. Sulcus sign.       Assessment & Plan:  Rotator cuff tendinitis, right the right shoulder was prepped with Betadine and the posterior lateral area. 40 mg of Kenalog and 3 mL of Xylocaine was injected into the subacromial bursa without difficulty. She tolerated the procedure well and did obtain relatively quick relief of her pain and range of motion. I explained that if she did not respond to this, further intervention may be needed.

## 2015-04-19 NOTE — Progress Notes (Signed)
Subjective:    Patient ID: Victoria Padilla, female    DOB: 1966-02-26, 50 y.o.   MRN: [id number]  HPI Chief Complaint  Patient presents with  . fasting cpe    fasting cpe, shoulder pain. no breast or pap. bs was 167 this morning   She is here for a complete physical exam and fasting blood work. She has complaints today of anterior and lateral right shoulder pain that is worse with movement and when layng on that shoulder. Has been ongoing for about a year but has gotten worse and severely bothering over the past 2 months. Denies injury to the shoulder. she has been taking Advil as needed for pain. States she occasionally has a dull ache to shoulder at rest.   Other providers: Dr. Laverta [place] OB/GYN, Dr. Einar Gip opthamologist. Dr. Milinda Pointer- podiatrist Last eye exam: 2015- due Last pap smear: 2014- and will schedule with Dr. Laverta [place] for this. She is having her cycles and report that they are regular. Mammogram: 2013 per patient and overdue Colonoscopy- within past 10 years and normal per patient.  Blood sugars have been 140s in morning fasting and 2 hours after eating 200-225. She has not started taking the glipizide yet. Last A1C >13. She states she is taking twice daily metformin and Invokana daily.   Exercising: walking 30 minutes 4 times a week Diet: watching her sugar intake and carbohydrates. She did see the nutritionist and this help.   Immunizations: flu shot- needs, Tdap - needs Pneumonia- had one in 2003  Reviewed allergies, medications, past medical, surgical, social and family history.  Review of Systems Review of Systems Constitutional: -fever, -chills, -sweats, -unexpected weight change,-fatigue ENT: -runny nose, -ear pain, -sore throat Cardiology:  -chest pain, -palpitations, -edema Respiratory: -cough, -shortness of breath, -wheezing Gastroenterology: -abdominal pain, -nausea, -vomiting, -diarrhea, -constipation  Hematology: -bleeding or bruising problems Musculoskeletal:  +arthralgias- right shoulder, -myalgias, -joint swelling, -back pain Ophthalmology: -vision changes Urology: -dysuria, -difficulty urinating, -hematuria, -urinary frequency, -urgency Neurology: -headache, -weakness, -tingling, -numbness       Objective:   Physical Exam BP 122/82 mmHg  Pulse 68  Ht 5\' 3"  (1.6 m)  Wt 240 lb 6.4 oz (109.045 kg)  BMI 42.60 kg/m2  General Appearance:    Alert, cooperative, no distress, appears stated age  Head:    Normocephalic, without obvious abnormality, atraumatic  Eyes:    PERRL, conjunctiva/corneas clear, EOM's intact, fundi    benign  Ears:    Normal TM's and external ear canals  Nose:   Nares normal, mucosa normal, no drainage or sinus   tenderness  Throat:   Lips, mucosa, and tongue normal; teeth and gums normal  Neck:   Supple, no lymphadenopathy;  thyroid:  no   enlargement/tenderness/nodules; no carotid   bruit or JVD  Back:    Spine nontender, no curvature, ROM normal, no CVA     tenderness  Lungs:     Clear to auscultation bilaterally without wheezes, rales or     ronchi; respirations unlabored  Chest Wall:    No tenderness or deformity   Heart:    Regular rate and rhythm, S1 and S2 normal, no murmur, rub   or gallop  Breast Exam:    Deferred, will see Gynecologist. Mammogram ordered. No axillary lymphadenopathy  Abdomen:     Soft, non-tender, nondistended, normoactive bowel sounds,    no masses, no hepatosplenomegaly  Genitalia:    Refused today. Will see her gynecologist for this.  Rectal:  3 stool cards sent  Extremities:   No clubbing, cyanosis or edema. Right shoulder without warmth, erythema, or obvious deformity, tenderness noted to anterior and lateral AC joint. Limited ROM due to pain, no laxity, no sulcus, positive neers and hawkins test.   Pulses:   2+ and symmetric all extremities  Skin:   Skin color, texture, turgor normal, no rashes or lesions. Dry patches with some darkening to lower back, dry skin to back.   Lymph  nodes:   Cervical, supraclavicular, and axillary nodes normal  Neurologic:   CNII-XII intact, normal strength, sensation and gait; reflexes 2+ and symmetric throughout          Psych:   Normal mood, affect, hygiene and grooming.    Urinalysis dipstick: positive for glucose, she is taking Invokana. Otherwise negative.      Assessment & Plan:  Routine general medical examination at a health care facility - Plan: POCT urinalysis dipstick, TSH, POCT occult blood stool  Essential hypertension  Uncontrolled type 2 diabetes mellitus without complication, without long-term current use of insulin (HCC)  Morbid obesity, unspecified obesity type ([place]) - Plan: TSH  Screening for breast cancer - Plan: MM DIGITAL SCREENING BILATERAL  Need for Tdap vaccination - Plan: Tdap vaccine greater than or equal to 7yo IM  Need for prophylactic vaccination and inoculation against influenza - Plan: HIV antibody, RPR  Routine screening for STI (sexually transmitted infection) - Plan: Flu Vaccine QUAD 36+ mos IM  Need for hepatitis C screening test - Plan: Hepatitis C antibody  Right shoulder pain - Plan: DG Shoulder Right  Dry skin  Special screening for malignant neoplasms, colon - Plan: POCT occult blood stool  Discussed that her blood sugars are still not within goal and this is most likely because she has not started taking her glipizide yet. Recommend that she start taking this today and call us Monday with her fasting blood sugars and 2 hour postprandial readings. Continue on metformin twice daily and Invokana. Her blood pressure today is within goal recommend continue on low-salt diet and current medication. Obesity: She has seen a nutritionist and plans to continue watching her portions and carbohydrate intake. She is currently exercising and I recommend that she keep this up. Discussed lifestyle modification for weight loss, diabetes and hypertension. Will order x-ray for right shoulder pain since  this appears to be worsening and she now has limited range of motion. Discussed the possibility of returning for injection to right shoulder and she would need to see Dr. Redmond School for this. She denies ever having hepatitis C screening as recommended for her age group. We will do this today. She denies ever having HIV screening, one time screening performed today. Flu shot given. Tdap given. Order placed for screening mammogram, she will call and schedule an appointment. Recommend using Eucerin or a lotion without alcohol to her back and see if this clears up the dry patches. Suspect that this may be related to eczema however if this does not clear up I will refer her to dermatology. She will follow-up in April for diabetes.  Recommend that she schedule an appointment for dental cleaning she is overdue. Also recommend that she follow up on an eye exam. She plans to schedule for Pap smear with her gynecologist. Instructions provided to return stool cards for colon cancer screening.

## 2015-04-19 NOTE — Addendum Note (Signed)
Addended by: Minette Headland A on: 04/19/2015 04:51 PM   Modules accepted: Orders

## 2015-04-20 LAB — RPR

## 2015-04-20 LAB — TSH: TSH: 1.27 m[IU]/L

## 2015-04-20 LAB — HIV ANTIBODY (ROUTINE TESTING W REFLEX): HIV: NONREACTIVE

## 2015-04-24 ENCOUNTER — Ambulatory Visit: Payer: BLUE CROSS/BLUE SHIELD | Admitting: Family Medicine

## 2015-04-26 ENCOUNTER — Ambulatory Visit: Payer: BLUE CROSS/BLUE SHIELD | Admitting: Family Medicine

## 2015-05-31 ENCOUNTER — Telehealth: Payer: Self-pay | Admitting: Family Medicine

## 2015-05-31 ENCOUNTER — Ambulatory Visit: Payer: BLUE CROSS/BLUE SHIELD | Admitting: Family Medicine

## 2015-05-31 NOTE — Telephone Encounter (Deleted)
Pt called answering service at 8:43pm on 4/4 to cancel appt

## 2015-06-02 ENCOUNTER — Other Ambulatory Visit: Payer: Self-pay | Admitting: Family Medicine

## 2015-06-13 NOTE — Telephone Encounter (Signed)
error 

## 2015-08-08 ENCOUNTER — Ambulatory Visit (INDEPENDENT_AMBULATORY_CARE_PROVIDER_SITE_OTHER): Payer: BLUE CROSS/BLUE SHIELD | Admitting: Medical

## 2015-08-08 ENCOUNTER — Encounter: Payer: Self-pay | Admitting: Medical

## 2015-08-08 VITALS — BP 130/80 | HR 86 | Wt 243.0 lb

## 2015-08-08 DIAGNOSIS — I10 Essential (primary) hypertension: Secondary | ICD-10-CM

## 2015-08-08 DIAGNOSIS — E785 Hyperlipidemia, unspecified: Secondary | ICD-10-CM

## 2015-08-08 DIAGNOSIS — E1165 Type 2 diabetes mellitus with hyperglycemia: Secondary | ICD-10-CM

## 2015-08-08 DIAGNOSIS — E1169 Type 2 diabetes mellitus with other specified complication: Secondary | ICD-10-CM | POA: Diagnosis not present

## 2015-08-08 DIAGNOSIS — Z862 Personal history of diseases of the blood and blood-forming organs and certain disorders involving the immune mechanism: Secondary | ICD-10-CM

## 2015-08-08 DIAGNOSIS — IMO0001 Reserved for inherently not codable concepts without codable children: Secondary | ICD-10-CM

## 2015-08-08 LAB — COMPREHENSIVE METABOLIC PANEL
ALBUMIN: 3.7 g/dL (ref 3.6–5.1)
ALT: 33 U/L — ABNORMAL HIGH (ref 6–29)
AST: 28 U/L (ref 10–35)
Alkaline Phosphatase: 102 U/L (ref 33–130)
BILIRUBIN TOTAL: 0.3 mg/dL (ref 0.2–1.2)
BUN: 15 mg/dL (ref 7–25)
CHLORIDE: 103 mmol/L (ref 98–110)
CO2: 27 mmol/L (ref 20–31)
CREATININE: 0.82 mg/dL (ref 0.50–1.05)
Calcium: 9.5 mg/dL (ref 8.6–10.4)
GLUCOSE: 187 mg/dL — AB (ref 65–99)
Potassium: 4.6 mmol/L (ref 3.5–5.3)
SODIUM: 139 mmol/L (ref 135–146)
Total Protein: 7.2 g/dL (ref 6.1–8.1)

## 2015-08-08 LAB — CBC
HCT: 39.4 % (ref 35.0–45.0)
HEMOGLOBIN: 12.2 g/dL (ref 11.7–15.5)
MCH: 22.7 pg — ABNORMAL LOW (ref 27.0–33.0)
MCHC: 31 g/dL — ABNORMAL LOW (ref 32.0–36.0)
MCV: 73.4 fL — ABNORMAL LOW (ref 80.0–100.0)
MPV: 10.6 fL (ref 7.5–12.5)
PLATELETS: 284 10*3/uL (ref 140–400)
RBC: 5.37 MIL/uL — AB (ref 3.80–5.10)
RDW: 16.7 % — ABNORMAL HIGH (ref 11.0–15.0)
WBC: 7.6 10*3/uL (ref 4.0–10.5)

## 2015-08-08 LAB — LIPID PANEL
Cholesterol: 199 mg/dL (ref 125–200)
HDL: 59 mg/dL (ref >=46)
LDL CALC: 116 mg/dL (ref <130)
Total CHOL/HDL Ratio: 3.4 Ratio (ref <=5.0)
Triglycerides: 120 mg/dL (ref <150)
VLDL: 24 mg/dL (ref <30)

## 2015-08-08 LAB — IRON AND TIBC
%SAT: 16 % (ref 11–50)
IRON: 66 ug/dL (ref 45–160)
TIBC: 420 ug/dL (ref 250–450)
UIBC: 354 ug/dL (ref 125–400)

## 2015-08-08 LAB — HEMOGLOBIN A1C
HEMOGLOBIN A1C: 8.6 % — AB (ref <5.7)
MEAN PLASMA GLUCOSE: 200 mg/dL
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
## 2015-08-09 ENCOUNTER — Other Ambulatory Visit: Payer: Self-pay | Admitting: Medical

## 2015-08-09 ENCOUNTER — Telehealth: Payer: Self-pay | Admitting: Medical

## 2015-08-09 MED ORDER — ASPIRIN 81 MG PO TABS: 81.0000 mg | ORAL_TABLET | Freq: Every day | ORAL | Status: DC

## 2015-08-09 MED ORDER — OMEPRAZOLE 40 MG PO CPDR: 40.0000 mg | DELAYED_RELEASE_CAPSULE | Freq: Every day | ORAL | Status: DC

## 2015-08-09 MED ORDER — EMPAGLIFLOZIN-METFORMIN HCL 12.5-1000 MG PO TABS: 1.0000 | ORAL_TABLET | Freq: Two times a day (BID) | ORAL | Status: DC

## 2015-08-09 MED ORDER — BENAZEPRIL-HYDROCHLOROTHIAZIDE 20-25 MG PO TABS: 1.0000 | ORAL_TABLET | Freq: Every day | ORAL | Status: DC

## 2015-08-09 MED ORDER — FERROUS SULFATE 325 (65 FE) MG PO TABS: 325.0000 mg | ORAL_TABLET | Freq: Every day | ORAL | Status: DC

## 2015-08-09 MED ORDER — GLIPIZIDE 10 MG PO TABS: 10.0000 mg | ORAL_TABLET | Freq: Every day | ORAL | Status: DC

## 2015-08-15 NOTE — Telephone Encounter (Signed)
P.A. Approved til 02/24/38, and activated discount card and faxed to pharmacy.  Left message for pt

## 2015-09-26 LAB — HM DIABETES EYE EXAM

## 2015-11-08 ENCOUNTER — Other Ambulatory Visit: Payer: Self-pay | Admitting: Medical

## 2015-11-09 NOTE — Telephone Encounter (Signed)
Send refill and get her in for diabetes f/u appt now

## 2015-11-09 NOTE — Telephone Encounter (Signed)
Left word for word message on machine  

## 2015-11-09 NOTE — Telephone Encounter (Signed)
Is this okay to refill? 

## 2015-12-14 ENCOUNTER — Encounter: Payer: Self-pay | Admitting: Family Medicine

## 2015-12-14 ENCOUNTER — Ambulatory Visit (INDEPENDENT_AMBULATORY_CARE_PROVIDER_SITE_OTHER): Payer: BLUE CROSS/BLUE SHIELD | Admitting: Family Medicine

## 2015-12-14 VITALS — BP 120/80 | HR 93 | Temp 98.1°F | Wt 246.6 lb

## 2015-12-14 DIAGNOSIS — IMO0001 Reserved for inherently not codable concepts without codable children: Secondary | ICD-10-CM

## 2015-12-14 DIAGNOSIS — R1010 Upper abdominal pain, unspecified: Secondary | ICD-10-CM

## 2015-12-14 DIAGNOSIS — R110 Nausea: Secondary | ICD-10-CM | POA: Diagnosis not present

## 2015-12-14 DIAGNOSIS — E1165 Type 2 diabetes mellitus with hyperglycemia: Secondary | ICD-10-CM

## 2015-12-14 LAB — CBC WITH DIFFERENTIAL/PLATELET
BASOS PCT: 0 %
Basophils Absolute: 0 cells/uL (ref 0–200)
EOS PCT: 1 %
Eosinophils Absolute: 87 cells/uL (ref 15–500)
HCT: 41.4 % (ref 35.0–45.0)
HEMOGLOBIN: 13.1 g/dL (ref 11.7–15.5)
LYMPHS ABS: 4350 {cells}/uL — AB (ref 850–3900)
Lymphocytes Relative: 50 %
MCH: 22.9 pg — AB (ref 27.0–33.0)
MCHC: 31.6 g/dL — ABNORMAL LOW (ref 32.0–36.0)
MCV: 72.4 fL — ABNORMAL LOW (ref 80.0–100.0)
MONOS PCT: 7 %
MPV: 11 fL (ref 7.5–12.5)
Monocytes Absolute: 609 cells/uL (ref 200–950)
NEUTROS ABS: 3654 {cells}/uL (ref 1500–7800)
Neutrophils Relative %: 42 %
PLATELETS: 257 10*3/uL (ref 140–400)
RBC: 5.72 MIL/uL — AB (ref 3.80–5.10)
RDW: 16 % — ABNORMAL HIGH (ref 11.0–15.0)
WBC: 8.7 10*3/uL (ref 4.0–10.5)

## 2015-12-14 LAB — POCT GLYCOSYLATED HEMOGLOBIN (HGB A1C)

## 2015-12-14 LAB — POCT URINALYSIS DIPSTICK
Bilirubin, UA: NEGATIVE
Blood, UA: NEGATIVE
KETONES UA: NEGATIVE
LEUKOCYTES UA: NEGATIVE
NITRITE UA: NEGATIVE
PH UA: 5.5
PROTEIN UA: NEGATIVE
Spec Grav, UA: >=1.03
Urobilinogen, UA: NEGATIVE

## 2015-12-14 MED ORDER — GLUCOSE BLOOD VI STRP: ORAL_STRIP | 12 refills | Status: DC

## 2015-12-14 MED ORDER — ONDANSETRON HCL 4 MG PO TABS: 4.0000 mg | ORAL_TABLET | Freq: Three times a day (TID) | ORAL | 0 refills | Status: DC | PRN

## 2015-12-14 NOTE — Progress Notes (Signed)
Subjective:    Patient ID: Victoria Padilla, female    DOB: 03/13/1965, 50 y.o.   MRN: [id number]  HPI Chief Complaint  Patient presents with  . stomach pain    stomach pain for the last 2 weeks. last 2 days have been more painful. right under breast bone   She is here with complaints of a 1 week history of intermittent upper abdominal pain that she states it sharp and occasionally feels crampy. Pain has been more constant for the past 2 days and occasionally radiates to her back. Standing upright and walking makes her pain worse and resting improves her pain. Pain is not improved or aggravated with eating.  Questionable early satiety. Some eructation.  Reports having diarrhea this morning that was loose dark brown, 1 episode. Denies blood or pus. No changes to diet, no recent hospitilazations or antibiotics.  Sates she has history of GERD and this does not feel like acid reflux. Has been taking prilosec daily.   Denies drinking alcohol or recent use of NSAIDS.  History of fatty liver. H. Pylori positive in the past and was treated.   Denies fever, chills, headache, dizziness, chest pain, palpitations, shortness of breath, orthopnea, LE edema, vomiting, urinary symptoms.   Last A1C 8.6% on 08/08/2015.  She has not been checking her blood sugar at home. Is taking her medication.   Past Medical History:  Diagnosis Date  . Acid reflux   . Arthritis   . Cervical dysplasia   . Diabetes mellitus   . Hyperlipidemia   . Hypertension   . LBP (low back pain)   . Obesity   . Sarcoidosis ([place])   . Vitamin D deficiency    Past Surgical History:  Procedure Laterality Date  . BUNIONECTOMY    . CHOLECYSTECTOMY    . COLPOSCOPY    . GYNECOLOGIC CRYOSURGERY    . KNEE ARTHROSCOPY    . TUBAL LIGATION     Reviewed allergies, medications, past medical, surgical,, and social history.   Review of Systems Pertinent positives and negatives in the history of present illness.     Objective:    Physical Exam  Constitutional: She is oriented to person, place, and time. She appears well-developed and well-nourished. No distress.  HENT:  Mouth/Throat: Uvula is midline, oropharynx is clear and moist and mucous membranes are normal.  Eyes: Conjunctivae are normal.  Neck: Full passive range of motion without pain. Neck supple.  Cardiovascular: Normal rate, regular rhythm, normal heart sounds and intact distal pulses.  Exam reveals no gallop and no friction rub.   No murmur heard. No LE edema  Pulmonary/Chest: Effort normal and breath sounds normal.  Abdominal: Soft. Normal appearance, normal aorta and bowel sounds are normal. She exhibits no distension. There is no hepatosplenomegaly. There is no tenderness. There is no rigidity, no rebound, no guarding, no CVA tenderness, no tenderness at McBurney's point and negative Murphy's sign.  Lymphadenopathy:    She has no cervical adenopathy.       Right: No supraclavicular adenopathy present.       Left: No supraclavicular adenopathy present.  Neurological: She is alert and oriented to person, place, and time. She has normal strength. No cranial nerve deficit or sensory deficit. Gait normal.  Skin: Skin is warm and dry. No rash noted. She is not diaphoretic. No pallor.  Psychiatric: She has a normal mood and affect. Her speech is normal and behavior is normal. Thought content normal.   BP 120/80  Pulse 93   Temp 98.1 F (36.7 C) (Oral)   Wt 246 lb 9.6 oz (111.9 kg)   BMI 43.68 kg/m      Assessment & Plan:  Pain of upper abdomen - Plan: Urinalysis Dipstick, CBC with Differential/Platelet, Comprehensive metabolic panel, Amylase, Lipase, CT ABDOMEN W WO CONTRAST  Uncontrolled type 2 diabetes mellitus without complication, without long-term current use of insulin (HCC) - Plan: HgB A1c  Nausea without vomiting  Discussed possible etiologies for her symptoms including pancreatitis, GERD, and musculoskeletal. She has had a  cholecystectomy. No cardiac symptoms. She does not appear infectious and her abdominal exam is unremarkable, unable to reproduce the pain.  GI cocktail given. She did not notice relief with this.  Zofran prescribed. Samples of Dexilant given #10. She will stop omeprazole and start Dexilant and see if this improves symptoms.  Labs ordered including lipase, amylase.  CT abdomen ordered for tomorrow.  Advised patient that is pain worsens or if she starts vomiting or having any new worrisome symptoms that she should go to the closest ED.  She will call in the morning and let us know how she is doing.   She is not here for a diabetes check but is overdue for hemoglobin A1C and today it is 9.1%. Reports good medication compliance but diet and exercise have not been good. Will have her return once abdominal pain resolved to further discuss diabetes.

## 2015-12-14 NOTE — Patient Instructions (Signed)
Stop the omeprazole and start [place]. I called in the Zofran for nausea to your pharmacy.  If you have worsening pain, vomiting or any other symptoms then I recommend you go to the emergency department for further evaluation.  You have a Cat scan of your abdomen scheduled for tomorrow at 3pm if you do not get worse before then.  Call our office in the morning and let Sabrina know how you are doing and to get the information to confirm the CT scan.

## 2015-12-15 ENCOUNTER — Ambulatory Visit (HOSPITAL_COMMUNITY): Admission: RE | Admit: 2015-12-15 | Payer: BLUE CROSS/BLUE SHIELD | Source: Ambulatory Visit

## 2015-12-15 LAB — COMPREHENSIVE METABOLIC PANEL
ALBUMIN: 4 g/dL (ref 3.6–5.1)
ALT: 32 U/L — AB (ref 6–29)
AST: 25 U/L (ref 10–35)
Alkaline Phosphatase: 116 U/L (ref 33–130)
BILIRUBIN TOTAL: 0.3 mg/dL (ref 0.2–1.2)
BUN: 14 mg/dL (ref 7–25)
CHLORIDE: 101 mmol/L (ref 98–110)
CO2: 28 mmol/L (ref 20–31)
CREATININE: 0.75 mg/dL (ref 0.50–1.05)
Calcium: 10.4 mg/dL (ref 8.6–10.4)
Glucose, Bld: 98 mg/dL (ref 65–99)
Potassium: 4.1 mmol/L (ref 3.5–5.3)
SODIUM: 141 mmol/L (ref 135–146)
TOTAL PROTEIN: 7.7 g/dL (ref 6.1–8.1)

## 2015-12-15 LAB — AMYLASE: AMYLASE: 58 U/L (ref 0–105)

## 2015-12-15 LAB — LIPASE: Lipase: 33 U/L (ref 7–60)

## 2015-12-21 ENCOUNTER — Encounter: Payer: Self-pay | Admitting: Family Medicine

## 2015-12-21 ENCOUNTER — Encounter: Payer: Self-pay | Admitting: Gastroenterology

## 2015-12-21 ENCOUNTER — Ambulatory Visit (INDEPENDENT_AMBULATORY_CARE_PROVIDER_SITE_OTHER): Payer: BLUE CROSS/BLUE SHIELD | Admitting: Family Medicine

## 2015-12-21 VITALS — BP 140/80 | HR 86 | Wt 243.2 lb

## 2015-12-21 DIAGNOSIS — E1165 Type 2 diabetes mellitus with hyperglycemia: Secondary | ICD-10-CM

## 2015-12-21 DIAGNOSIS — Z23 Encounter for immunization: Secondary | ICD-10-CM

## 2015-12-21 DIAGNOSIS — E785 Hyperlipidemia, unspecified: Secondary | ICD-10-CM

## 2015-12-21 DIAGNOSIS — I10 Essential (primary) hypertension: Secondary | ICD-10-CM | POA: Diagnosis not present

## 2015-12-21 DIAGNOSIS — Z91199 Patient's noncompliance with other medical treatment and regimen due to unspecified reason: Secondary | ICD-10-CM

## 2015-12-21 DIAGNOSIS — K219 Gastro-esophageal reflux disease without esophagitis: Secondary | ICD-10-CM | POA: Insufficient documentation

## 2015-12-21 DIAGNOSIS — E1169 Type 2 diabetes mellitus with other specified complication: Secondary | ICD-10-CM

## 2015-12-21 DIAGNOSIS — Z1211 Encounter for screening for malignant neoplasm of colon: Secondary | ICD-10-CM | POA: Diagnosis not present

## 2015-12-21 DIAGNOSIS — Z9119 Patient's noncompliance with other medical treatment and regimen: Secondary | ICD-10-CM

## 2015-12-21 DIAGNOSIS — E11319 Type 2 diabetes mellitus with unspecified diabetic retinopathy without macular edema: Secondary | ICD-10-CM | POA: Diagnosis not present

## 2015-12-21 HISTORY — DX: Patient's noncompliance with other medical treatment and regimen due to unspecified reason: Z91.199

## 2015-12-21 MED ORDER — SIMVASTATIN 20 MG PO TABS: 20.0000 mg | ORAL_TABLET | Freq: Every day | ORAL | 1 refills | Status: DC

## 2015-12-21 NOTE — Patient Instructions (Addendum)
Check your blood sugar daily either fasting in the morning before breakfast or 2 hours after a meal and keep track of these.   Return in 5-6 weeks for a fasting cholesterol check. Start taking medication to lower your cholesterol.   Return in 3 months for diabetes check and bring in your readings. Be consistent with your medications.   I am referring you to the GI doctor for further evaluation of GERD and screening colonoscopy.     Gastroesophageal Reflux Disease, Adult Normally, food travels down the esophagus and stays in the stomach to be digested. However, when a person has gastroesophageal reflux disease (GERD), food and stomach acid move back up into the esophagus. When this happens, the esophagus becomes sore and inflamed. Over time, GERD can create small holes (ulcers) in the lining of the esophagus.  CAUSES This condition is caused by a problem with the muscle between the esophagus and the stomach (lower esophageal sphincter, or LES). Normally, the LES muscle closes after food passes through the esophagus to the stomach. When the LES is weakened or abnormal, it does not close properly, and that allows food and stomach acid to go back up into the esophagus. The LES can be weakened by certain dietary substances, medicines, and medical conditions, including:  Tobacco use.  Pregnancy.  Having a hiatal hernia.  Heavy alcohol use.  Certain foods and beverages, such as coffee, chocolate, onions, and peppermint. RISK FACTORS This condition is more likely to develop in:  People who have an increased body weight.  People who have connective tissue disorders.  People who use NSAID medicines. SYMPTOMS Symptoms of this condition include:  Heartburn.  Difficult or painful swallowing.  The feeling of having a lump in the throat.  Abitter taste in the mouth.  Bad breath.  Having a large amount of saliva.  Having an upset or bloated stomach.  Belching.  Chest  pain.  Shortness of breath or wheezing.  Ongoing (chronic) cough or a night-time cough.  Wearing away of tooth enamel.  Weight loss. Different conditions can cause chest pain. Make sure to see your health care provider if you experience chest pain. DIAGNOSIS Your health care provider will take a medical history and perform a physical exam. To determine if you have mild or severe GERD, your health care provider may also monitor how you respond to treatment. You may also have other tests, including:  An endoscopy toexamine your stomach and esophagus with a small camera.  A test thatmeasures the acidity level in your esophagus.  A test thatmeasures how much pressure is on your esophagus.  A barium swallow or modified barium swallow to show the shape, size, and functioning of your esophagus. TREATMENT The goal of treatment is to help relieve your symptoms and to prevent complications. Treatment for this condition may vary depending on how severe your symptoms are. Your health care provider may recommend:  Changes to your diet.  Medicine.  Surgery. HOME CARE INSTRUCTIONS Diet  Follow a diet as recommended by your health care provider. This may involve avoiding foods and drinks such as:  Coffee and tea (with or without caffeine).  Drinks that containalcohol.  Energy drinks and sports drinks.  Carbonated drinks or sodas.  Chocolate and cocoa.  Peppermint and mint flavorings.  Garlic and onions.  Horseradish.  Spicy and acidic foods, including peppers, chili powder, curry powder, vinegar, hot sauces, and barbecue sauce.  Citrus fruit juices and citrus fruits, such as oranges, lemons, and limes.  Tomato-based foods, such as red sauce, chili, salsa, and pizza with red sauce.  Fried and fatty foods, such as donuts, french fries, potato chips, and high-fat dressings.  High-fat meats, such as hot dogs and fatty cuts of red and white meats, such as rib eye steak,  sausage, ham, and bacon.  High-fat dairy items, such as whole milk, butter, and cream cheese.  Eat small, frequent meals instead of large meals.  Avoid drinking large amounts of liquid with your meals.  Avoid eating meals during the 2-3 hours before bedtime.  Avoid lying down right after you eat.  Do not exercise right after you eat. General Instructions  Pay attention to any changes in your symptoms.  Take over-the-counter and prescription medicines only as told by your health care provider. Do not take aspirin, ibuprofen, or other NSAIDs unless your health care provider told you to do so.  Do not use any tobacco products, including cigarettes, chewing tobacco, and e-cigarettes. If you need help quitting, ask your health care provider.  Wear loose-fitting clothing. Do not wear anything tight around your waist that causes pressure on your abdomen.  Raise (elevate) the head of your bed 6 inches (15cm).  Try to reduce your stress, such as with yoga or meditation. If you need help reducing stress, ask your health care provider.  If you are overweight, reduce your weight to an amount that is healthy for you. Ask your health care provider for guidance about a safe weight loss goal.  Keep all follow-up visits as told by your health care provider. This is important. SEEK MEDICAL CARE IF:  You have new symptoms.  You have unexplained weight loss.  You have difficulty swallowing, or it hurts to swallow.  You have wheezing or a persistent cough.  Your symptoms do not improve with treatment.  You have a hoarse voice. SEEK IMMEDIATE MEDICAL CARE IF:  You have pain in your arms, neck, jaw, teeth, or back.  You feel sweaty, dizzy, or light-headed.  You have chest pain or shortness of breath.  You vomit and your vomit looks like blood or coffee grounds.  You faint.  Your stool is bloody or black.  You cannot swallow, drink, or eat.   This information is not intended to  replace advice given to you by your health care provider. Make sure you discuss any questions you have with your health care provider.   Document Released: 11/21/2004 Document Revised: 11/02/2014 Document Reviewed: 06/08/2014 Elsevier Interactive Patient Education [employer].

## 2015-12-21 NOTE — Progress Notes (Signed)
Subjective:    Patient ID: Victoria Padilla, female    DOB: 1966-01-28, 50 y.o.   MRN: [id number]  Victoria Padilla is a 50 y.o. female who presents for follow-up of Type 2 diabetes mellitus.  Patient is not checking home blood sugars.   Home blood sugar records: patient does not check sugars.  How often is blood sugars being checked: supposed to be twice a day Current symptoms include: none. Patient denies increased appetite, nausea, visual disturbances, vomiting and weight loss.  Patient is checking their feet daily. Any Foot concerns (callous, ulcer, wound, thickened nails, toenail fungus, skin fungus, hammer toe): none Last dilated eye exam: August 2017   Current treatments: medications and has not been taking them consistently.  Medication compliance: excellent  Current diet: in general, a "healthy" diet   Current exercise: none Known diabetic complications: retinopathy per patient. Records not available.   She is aware that she is non compliant. Has not been motivated to improve diet or start exercising. States she is aware that she needs to loose weight.   She is also here for follow up on upper abdominal pain. Last week she was having constant upper abdominal pain. Labs did not show any explanation for this. She stopped prilosec and started taking Dexilant. Reports she is basically back to normal but does think she is having reflux symptoms.  She has never had a screening colonoscopy. She is willing to see GI and discuss this.   The following portions of the patient's history were reviewed and updated as appropriate: allergies, current medications, past medical history, past social history and problem list.  ROS as in subjective above.     Objective:    Physical Exam Alert and in no distress otherwise not examined. Hemoglobin A1C 9.1% which is up from 8.6% in June 2017.   Blood pressure 140/80, pulse 86, weight 243 lb 3.2 oz (110.3 kg).  Lab Review Diabetic Labs Latest  Ref Rng & Units 12/21/2015 12/14/2015 08/08/2015 03/03/2015 03/03/2015  HbA1c - - 9.1% 8.6(H) 13.1% -  Microalbumin Not estab mg/dL 1.3 - - - -  Micro/Creat Ratio <30 mcg/mg creat 15 - - - -  Chol 125 - 200 mg/dL - - 199 - -  HDL >=46 mg/dL - - 59 - -  Calc LDL <130 mg/dL - - 116 - -  Triglycerides <150 mg/dL - - 120 - -  Creatinine 0.50 - 1.05 mg/dL - 0.75 0.82 - 0.68   BP/Weight 12/21/2015 12/14/2015 08/08/2015 04/19/2015 [id number]  Systolic BP [id number] -  Diastolic BP 80 80 80 82 -  Wt. (Lbs) 243.2 246.6 243 240.4 241  BMI 43.08 43.68 43.06 42.6 42.7   Foot/eye exam completion dates Latest Ref Rng & Units 09/26/2015 08/08/2015  Eye Exam No Retinopathy Retinopathy(A) -  Foot Form Completion - - Done    Victoria Padilla  reports that she has never smoked. She has never used smokeless tobacco. She reports that she does not drink alcohol or use drugs.     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, without long-term current use of insulin, macular edema presence unspecified, unspecified retinopathy severity ([place]) - Plan: Microalbumin / creatinine urine ratio  Essential hypertension  Morbid obesity (HCC)  Gastroesophageal reflux disease, esophagitis presence not specified - Plan: Ambulatory referral to Gastroenterology  Personal history of noncompliance with medical treatment, presenting hazards to health  Need for prophylactic vaccination and inoculation against influenza -  Plan: Flu Vaccine QUAD 36+ mos IM  Special screening for malignant neoplasms, colon - Plan: Ambulatory referral to Gastroenterology  Hyperlipidemia associated with type 2 diabetes mellitus ([place]) - Plan: Lipid panel  1. Rx changes: add Statin for primary prevention. start taking diabetes daily  2. Education: Reviewed 'ABCs' of diabetes management (respective goals in parentheses):  A1C (<7), blood pressure (<130/80), and cholesterol (LDL <100). 3. Compliance at present is  estimated to be poor. Efforts to improve compliance (if necessary) will be directed at dietary modifications: watch carbohydrates and sugar intake, increased exercise, regular blood sugar monitoring: daily and start taking medications daily. 4. Follow up: 3 months  5. Discussed cardiac risk factors and her ASCVD is 11.8% 6. Plan to start her on a statin today and repeat fasting lipids in 5-6 weeks.  7. Advised that morbid obesity places her at increased risk for worsening chronic health conditions.  8. Referral to GI for GERD and screening colonoscopy.  9. Flu shot given.

## 2015-12-22 LAB — MICROALBUMIN / CREATININE URINE RATIO
Creatinine, Urine: 89 mg/dL (ref 20–320)
MICROALB UR: 1.3 mg/dL
MICROALB/CREAT RATIO: 15 ug/mg{creat} (ref <30)

## 2016-01-23 ENCOUNTER — Other Ambulatory Visit: Payer: BLUE CROSS/BLUE SHIELD

## 2016-01-23 DIAGNOSIS — Z0000 Encounter for general adult medical examination without abnormal findings: Secondary | ICD-10-CM

## 2016-01-23 DIAGNOSIS — E1169 Type 2 diabetes mellitus with other specified complication: Secondary | ICD-10-CM

## 2016-01-23 DIAGNOSIS — E785 Hyperlipidemia, unspecified: Principal | ICD-10-CM

## 2016-01-23 DIAGNOSIS — Z1211 Encounter for screening for malignant neoplasm of colon: Secondary | ICD-10-CM

## 2016-01-23 LAB — LIPID PANEL
CHOLESTEROL: 127 mg/dL (ref <200)
HDL: 42 mg/dL — ABNORMAL LOW (ref >50)
LDL Cholesterol: 58 mg/dL (ref <100)
TRIGLYCERIDES: 137 mg/dL (ref <150)
Total CHOL/HDL Ratio: 3 Ratio (ref <5.0)
VLDL: 27 mg/dL (ref <30)

## 2016-02-02 ENCOUNTER — Other Ambulatory Visit: Payer: Self-pay | Admitting: Medical

## 2016-02-08 ENCOUNTER — Encounter: Payer: Self-pay | Admitting: Family Medicine

## 2016-02-08 ENCOUNTER — Ambulatory Visit (INDEPENDENT_AMBULATORY_CARE_PROVIDER_SITE_OTHER): Payer: BLUE CROSS/BLUE SHIELD | Admitting: Family Medicine

## 2016-02-08 VITALS — BP 120/80 | HR 87 | Temp 98.2°F | Resp 16 | Wt 249.4 lb

## 2016-02-08 DIAGNOSIS — J209 Acute bronchitis, unspecified: Secondary | ICD-10-CM

## 2016-02-08 MED ORDER — BENZONATATE 200 MG PO CAPS: 200.0000 mg | ORAL_CAPSULE | Freq: Two times a day (BID) | ORAL | 0 refills | Status: DC | PRN

## 2016-02-08 MED ORDER — ALBUTEROL SULFATE HFA 108 (90 BASE) MCG/ACT IN AERS: 2.0000 | INHALATION_SPRAY | Freq: Four times a day (QID) | RESPIRATORY_TRACT | 0 refills | Status: DC | PRN

## 2016-02-08 MED ORDER — AZITHROMYCIN 250 MG PO TABS: ORAL_TABLET | ORAL | 0 refills | Status: DC

## 2016-02-08 NOTE — Progress Notes (Signed)
Subjective: Chief Complaint  Patient presents with  . cough    cough, running nose, wheezing, tight chest- started thursday     Victoria Padilla is a 50 y.o. female who presents for possible bronchitis.  Symptoms include a one week history of right ear pain, rhinorrhea, nasal congestion, sinus pressure and headache. Cough is dry and getting worse.  Denies fever, chills, body aches, abdominal pain, N/V/D.  Treatment to date: none.  ? sick contacts.   She does not smoke.   She does have a history of bronchitis.   No other aggravating or relieving factors.  No other c/o.  The following portions of the patient's history were reviewed and updated as appropriate: allergies, current medications, past family history, past medical history, past social history, past surgical history and problem list.  ROS as in subjective  Past Medical History:  Diagnosis Date  . Acid reflux   . Arthritis   . Cervical dysplasia   . Diabetes mellitus   . Hyperlipidemia   . Hypertension   . LBP (low back pain)   . Obesity   . Sarcoidosis ([place])   . Vitamin D deficiency      Objective: BP 120/80   Pulse 87   Temp 98.2 F (36.8 C) (Oral)   Resp 16   Wt 249 lb 6.4 oz (113.1 kg)   SpO2 98%   BMI 44.18 kg/m   Vital signs reviewed  General appearance: Alert, WD/WN, no distress, ill appearing                             Skin: warm, no rash, no diaphoresis                           Head: no sinus tenderness                            Eyes: conjunctiva normal, corneas clear, PERRLA                            Ears: pearly TMs, external ear canals normal                          Nose: septum midline, turbinates swollen, with erythema and clear discharge             Mouth/throat: MMM, tongue normal, mild pharyngeal erythema                           Neck: supple, no adenopathy, no thyromegaly, nontender                          Heart: RRR, normal S1, S2, no murmurs                         Lungs: lungs  clear in all lung field, no wheezes, no rales                Extremities: no edema, nontender      Assessment: .Acute bronchitis, unspecified organism   Plan:  Medication orders today include: Z-pak, tessalon, albuterol inhaler.   Discussed diagnosis and treatment of bronchitis.  Suggested symptomatic OTC remedies for cough and congestion.  Tylenol or Ibuprofen OTC for fever and malaise.  Call/return if not back to baseline 10 days after completing the antibiotic.  Advised that cough may linger even after the infection is improved.

## 2016-02-20 ENCOUNTER — Ambulatory Visit (INDEPENDENT_AMBULATORY_CARE_PROVIDER_SITE_OTHER): Payer: BLUE CROSS/BLUE SHIELD | Admitting: Gastroenterology

## 2016-02-20 ENCOUNTER — Encounter: Payer: Self-pay | Admitting: Gastroenterology

## 2016-02-20 VITALS — BP 128/86 | HR 80 | Ht 63.0 in | Wt 241.4 lb

## 2016-02-20 DIAGNOSIS — Z1211 Encounter for screening for malignant neoplasm of colon: Secondary | ICD-10-CM | POA: Diagnosis not present

## 2016-02-20 DIAGNOSIS — G8929 Other chronic pain: Secondary | ICD-10-CM

## 2016-02-20 DIAGNOSIS — K625 Hemorrhage of anus and rectum: Secondary | ICD-10-CM

## 2016-02-20 DIAGNOSIS — R1013 Epigastric pain: Secondary | ICD-10-CM | POA: Diagnosis not present

## 2016-02-20 DIAGNOSIS — R932 Abnormal findings on diagnostic imaging of liver and biliary tract: Secondary | ICD-10-CM

## 2016-02-20 DIAGNOSIS — R110 Nausea: Secondary | ICD-10-CM

## 2016-02-20 MED ORDER — ONDANSETRON 4 MG PO TBDP: 4.0000 mg | ORAL_TABLET | Freq: Three times a day (TID) | ORAL | 3 refills | Status: DC | PRN

## 2016-02-20 MED ORDER — NA SULFATE-K SULFATE-MG SULF 17.5-3.13-1.6 GM/177ML PO SOLN: 1.0000 | Freq: Once | ORAL | 0 refills | Status: AC

## 2016-02-20 NOTE — Patient Instructions (Addendum)
If you are age 53 or older, your body mass index should be between 23-30. Your Body mass index is 42.76 kg/m. If this is out of the aforementioned range listed, please consider follow up with your Primary Care Provider.  If you are age 1 or younger, your body mass index should be between 19-25. Your Body mass index is 42.76 kg/m. If this is out of the aformentioned range listed, please consider follow up with your Primary Care Provider.   We have sent the following medications to your pharmacy for you to pick up at your convenience:  [place] have been scheduled for an endoscopy and colonoscopy. Please follow the written instructions given to you at your visit today. Please pick up your prep supplies at the pharmacy within the next 1-3 days. If you use inhalers (even only as needed), please bring them with you on the day of your procedure. Your physician has requested that you go to www.startemmi.com and enter the access code given to you at your visit today. This web site gives a general overview about your procedure. However, you should still follow specific instructions given to you by our office regarding your preparation for the procedure.  You have been scheduled for an abdominal ultrasound at [facility] (1st floor of hospital) on Thursday 02/22/16 at  9:30am. Please arrive 15 minutes prior to your appointment for registration. Make certain not to have anything to eat or drink after midnight prior to your appointment. Should you need to reschedule your appointment, please contact radiology at [phone]. This test typically takes about 30 minutes to perform.

## 2016-02-20 NOTE — Progress Notes (Signed)
HPI :  50 y/o female with a history of sarcoidosis with hepatic involvement, fatty liver, DM, overweight, here for a new patient visit. She was seen remotely by Dr. Deatra Ina.   She reports feeling nauseated and discomfort in her upper abdomen. She reports symptoms ongoing for 1-2 years, comes and goes. She reports it had been intermittent, but some times can last longer, upwards of a week. She typically denies any pain after she eats. She denies any clear precipitants. The nausea is not related to eating. She has had some episodes of vomiting, but this is rare. She has some rare dysphagia, with pills, or larger boluses of ingestion. Feels it in the sternal notch. She has some occasional heartburn and regurgitation. She thinks weight has been fluctuating, no progressing.  She takes advil rarely. She is staking prilosec for > 10 years. She is on synjardy, combination pill of metformin, she does not think this has been related to her symptoms.   Grandfather had colon cancer. No stomach cancer in the family. Uncle may also have had colon cancer.   Patient has never had a prior colonoscopy. She has not taken any medications for this issue. She reports she is normally nauseated in the morning when she wakes. No routine headaches. Sometimes eating can make nausea better. She denies early satiety but eating less than usual, because of symptoms.   She has a history of sarcoidosis. She is not taking anything for this. She had a liver biopsy in 2003 showing mild granulomas and mild fatty liver.   She takes ferrous sulfate 31m for microcytosis but her Hgb has been stable and iron levels in June were normal.   She had an [place] in 2015 showing ? Cirrhosis of the liver - hetergenous change as well as fatty liver.  She has occasional blood in her stools. Sometimes she sees mucous. She has had some constipation. She has a bowel movement using tea based laxatives, having a BM most days of the week.  Having a BM does not make her abdomen feel any better.   EGD 07/14/2001 - normal Liver biopsy 07/30/2001 - rare non-caseating granuloma and mild steatosis, path c/w hepatic sarcoidosis  Past Medical History:  Diagnosis Date  . Acid reflux   . Arthritis   . Cervical dysplasia   . Diabetes mellitus   . Hyperlipidemia   . Hypertension   . LBP (low back pain)   . Obesity   . Sarcoidosis ([place]   . Vitamin D deficiency      Past Surgical History:  Procedure Laterality Date  . BUNIONECTOMY    . CHOLECYSTECTOMY    . COLPOSCOPY    . GYNECOLOGIC CRYOSURGERY    . KNEE ARTHROSCOPY    . TUBAL LIGATION     Family History  Problem Relation Age of Onset  . Heart failure Mother 55   chf  . Heart failure Father   . Cancer Brother 382   prostate  . Hyperlipidemia Brother   . Hypertension Brother   . Diabetes Brother   . Hypertension Maternal Uncle   . Hyperlipidemia Brother   . Diabetes Brother   . Colon cancer Maternal Grandfather   . Colon polyps Brother   . Heart disease Brother   . Stomach cancer Neg Hx   . Esophageal cancer Neg Hx   . Rectal cancer Neg Hx   . Liver cancer Neg Hx    Social History  Substance Use Topics  .  Smoking status: Never Smoker  . Smokeless tobacco: Never Used  . Alcohol use No   Current Outpatient Prescriptions  Medication Sig Dispense Refill  . albuterol (PROVENTIL HFA;VENTOLIN HFA) 108 (90 Base) MCG/ACT inhaler Inhale 2 puffs into the lungs every 6 (six) hours as needed for wheezing or shortness of breath. 1 Inhaler 0  . aspirin 81 MG tablet Take 1 tablet (81 mg total) by mouth daily. Reported on 08/08/2015 90 tablet 3  . BAYER MICROLET LANCETS lancets Test twice a day 100 each 12  . benazepril-hydrochlorthiazide (LOTENSIN HCT) 20-25 MG tablet Take 1 tablet by mouth daily. 90 tablet 3  . benzonatate (TESSALON) 200 MG capsule Take 1 capsule (200 mg total) by mouth 2 (two) times daily as needed for cough. 20 capsule 0  . cholecalciferol (VITAMIN D)  1000 units tablet Take 2,000 Units by mouth daily.    . Cyanocobalamin (B-12 PO) Take 1 tablet by mouth daily.    . Empagliflozin-Metformin HCl (SYNJARDY) 12.06-998 MG TABS Take 1 tablet by mouth 2 (two) times daily. 180 tablet 1  . ferrous sulfate 325 (65 FE) MG tablet TAKE ONE TABLET BY MOUTH ONCE DAILY WITH BREAKFAST 90 tablet 0  . glipiZIDE (GLUCOTROL) 10 MG tablet TAKE ONE TABLET BY MOUTH ONCE DAILY BEFORE BREAKFAST 90 tablet 1  . glucose blood (BAYER CONTOUR NEXT TEST) test strip Test twice a day. 100 each 12  . Multiple Vitamins-Minerals (MULTIVITAMIN PO) Take 2 capsules by mouth 2 (two) times daily. Reported on 03/10/2015    . Omega-3 Fatty Acids (FISH OIL PO) Take 1 capsule by mouth daily. Reported on 08/08/2015    . omeprazole (PRILOSEC) 20 MG capsule Take 20 mg by mouth daily.    . simvastatin (ZOCOR) 20 MG tablet Take 1 tablet (20 mg total) by mouth at bedtime. 90 tablet 1  . Na Sulfate-K Sulfate-Mg Sulf 17.5-3.13-1.6 GM/180ML SOLN Take 1 kit by mouth once. 354 mL 0  . ondansetron (ZOFRAN ODT) 4 MG disintegrating tablet Take 1 tablet (4 mg total) by mouth every 8 (eight) hours as needed for nausea or vomiting. 30 tablet 3   No current facility-administered medications for this visit.    Allergies  Allergen Reactions  . Naprosyn [Naproxen]      Review of Systems: All systems reviewed and negative except where noted in HPI.   Lab Results  Component Value Date   WBC 8.7 12/14/2015   HGB 13.1 12/14/2015   HCT 41.4 12/14/2015   MCV 72.4 (L) 12/14/2015   PLT 257 12/14/2015    Lab Results  Component Value Date   CREATININE 0.75 12/14/2015   BUN 14 12/14/2015   NA 141 12/14/2015   K 4.1 12/14/2015   CL 101 12/14/2015   CO2 28 12/14/2015    Lab Results  Component Value Date   ALT 32 (H) 12/14/2015   AST 25 12/14/2015   ALKPHOS 116 12/14/2015   BILITOT 0.3 12/14/2015    No results found for: INR, PROTIME   Physical Exam: BP 128/86   Pulse 80   Ht 5' 3"  (1.6 m)    Wt 241 lb 6 oz (109.5 kg)   BMI 42.76 kg/m  Constitutional: Pleasant,well-developed, female in no acute distress. HEENT: Normocephalic and atraumatic. Conjunctivae are normal. No scleral icterus. Neck supple.  Cardiovascular: Normal rate, regular rhythm.  Pulmonary/chest: Effort normal and breath sounds normal. No wheezing, rales or rhonchi. Abdominal: Soft, protuberant,, nontender. There are no masses palpable. No hepatomegaly. Extremities: no edema Lymphadenopathy: No  cervical adenopathy noted. Neurological: Alert and oriented to person place and time. Skin: Skin is warm and dry. No rashes noted. Psychiatric: Normal mood and affect. Behavior is normal.   ASSESSMENT AND PLAN: 50 year old female with medical history as outlined above here for new patient evaluation to assess the following issues:  Chronic nausea / epigastric discomfort / rare dysphagia - symptoms are intermittent without clear precipitant. Ongoing for almost 2 years, no alarm symptoms. Perhaps iron supplementation is making this worse and recommend stopping it given she has no evidence of iron deficiency, she has chronic microcytosis with normal iron stores, perhaps thallasemia. Recommend an upper endoscopy to evaluate the upper tract, rule out H. Pylori, other mucosal processes. In the interim we'll try some Zofran to use every 8 hours as needed to see if this helps her nausea. She should continue her PPI interim. If no clear cause on EGD will consider cross sectional imaging.  Nonspecific liver imaging / history of fatty liver and hepatic sarcoidosis - prior ultrasound in the past have showed questionable changes concerning for cirrhosis. I suspect hepatic changes may more likely just reflect hepatic sarcoidosis. She has no splenomegaly and platelet counts normal. Last ALT was just mildly elevated. We'll repeat LFTs now and check INR to ensure normal. She has negative hep C viral testing, will test for hepatitis B as well  to ensure negative. I counseled her on fatty liver disease. She should avoid alcohol, recommend diet and exercise for weight loss, liberal coffee intake is recommended.  As ALT and LAEs relatively normal, I don't feel like we need to escalate therapy or treated for hepatic sarcoidosis. Recommend interval ultrasound to ensure no changes concerning for cirrhosis. Will await ultrasound result.  Rectal bleeding / colon cancer screening - ongoing intermittent mild rectal bleeding. She is overdue for colon cancer screening and recommend optical colonoscopy to further evaluate. I discussed risks benefits of colonoscopy and endoscopy with her and she wished to proceed. She prefers to use her tea based regimen for laxatives which has been treating constipation appropriately. We'll await results of her testing.  [place], MD [facility] Pager [phone]   CC: [name], NP

## 2016-02-22 ENCOUNTER — Ambulatory Visit (HOSPITAL_COMMUNITY)
Admission: RE | Admit: 2016-02-22 | Discharge: 2016-02-22 | Disposition: A | Discharge status: Home / Self Care | Payer: BLUE CROSS/BLUE SHIELD | Source: Ambulatory Visit | Attending: Gastroenterology | Admitting: Gastroenterology

## 2016-02-22 DIAGNOSIS — G8929 Other chronic pain: Secondary | ICD-10-CM | POA: Insufficient documentation

## 2016-02-22 DIAGNOSIS — K7689 Other specified diseases of liver: Secondary | ICD-10-CM | POA: Insufficient documentation

## 2016-02-22 DIAGNOSIS — R110 Nausea: Secondary | ICD-10-CM | POA: Insufficient documentation

## 2016-02-22 DIAGNOSIS — K625 Hemorrhage of anus and rectum: Secondary | ICD-10-CM | POA: Insufficient documentation

## 2016-02-22 DIAGNOSIS — R1013 Epigastric pain: Secondary | ICD-10-CM | POA: Insufficient documentation

## 2016-02-22 DIAGNOSIS — Z1211 Encounter for screening for malignant neoplasm of colon: Secondary | ICD-10-CM | POA: Insufficient documentation

## 2016-02-22 DIAGNOSIS — Z9049 Acquired absence of other specified parts of digestive tract: Secondary | ICD-10-CM | POA: Diagnosis not present

## 2016-02-22 DIAGNOSIS — R932 Abnormal findings on diagnostic imaging of liver and biliary tract: Secondary | ICD-10-CM | POA: Diagnosis not present

## 2016-02-29 ENCOUNTER — Telehealth: Payer: Self-pay | Admitting: Gastroenterology

## 2016-02-29 ENCOUNTER — Other Ambulatory Visit: Payer: Self-pay

## 2016-02-29 DIAGNOSIS — R935 Abnormal findings on diagnostic imaging of other abdominal regions, including retroperitoneum: Secondary | ICD-10-CM

## 2016-03-01 ENCOUNTER — Other Ambulatory Visit (INDEPENDENT_AMBULATORY_CARE_PROVIDER_SITE_OTHER): Payer: BLUE CROSS/BLUE SHIELD

## 2016-03-01 DIAGNOSIS — R932 Abnormal findings on diagnostic imaging of liver and biliary tract: Secondary | ICD-10-CM

## 2016-03-01 DIAGNOSIS — R1013 Epigastric pain: Secondary | ICD-10-CM

## 2016-03-01 DIAGNOSIS — R935 Abnormal findings on diagnostic imaging of other abdominal regions, including retroperitoneum: Secondary | ICD-10-CM

## 2016-03-01 DIAGNOSIS — Z1211 Encounter for screening for malignant neoplasm of colon: Secondary | ICD-10-CM | POA: Diagnosis not present

## 2016-03-01 DIAGNOSIS — K625 Hemorrhage of anus and rectum: Secondary | ICD-10-CM

## 2016-03-01 DIAGNOSIS — R110 Nausea: Secondary | ICD-10-CM | POA: Diagnosis not present

## 2016-03-01 DIAGNOSIS — G8929 Other chronic pain: Secondary | ICD-10-CM

## 2016-03-01 LAB — HEPATITIS B SURFACE ANTIBODY,QUALITATIVE: Hep B S Ab: NEGATIVE

## 2016-03-01 LAB — PROTIME-INR
INR: 1 ratio (ref 0.8–1.0)
Prothrombin Time: 10.4 s (ref 9.6–13.1)

## 2016-03-01 LAB — HEPATIC FUNCTION PANEL
ALK PHOS: 112 U/L (ref 39–117)
ALT: 42 U/L — ABNORMAL HIGH (ref 0–35)
AST: 51 U/L — AB (ref 0–37)
Albumin: 4.3 g/dL (ref 3.5–5.2)
BILIRUBIN DIRECT: 0.1 mg/dL (ref 0.0–0.3)
Total Bilirubin: 0.3 mg/dL (ref 0.2–1.2)
Total Protein: 8.2 g/dL (ref 6.0–8.3)

## 2016-03-01 LAB — HEPATITIS A ANTIBODY, TOTAL: Hep A Total Ab: REACTIVE — AB

## 2016-03-01 LAB — HEPATITIS B SURFACE ANTIGEN: HEP B S AG: NEGATIVE

## 2016-03-04 LAB — CERULOPLASMIN: CERULOPLASMIN: 41 mg/dL (ref 18–53)

## 2016-03-04 LAB — ANA: ANA: NEGATIVE

## 2016-03-04 LAB — ANTI-SMOOTH MUSCLE ANTIBODY, IGG: Smooth Muscle Ab: <20 U (ref <20)

## 2016-03-04 LAB — IGG: IgG (Immunoglobin G), Serum: 1509 mg/dL (ref 694–1618)

## 2016-03-04 LAB — ALPHA-1-ANTITRYPSIN: A1 ANTITRYPSIN SER: 124 mg/dL (ref 83–199)

## 2016-03-04 NOTE — Telephone Encounter (Signed)
Patient states she has additional questions regarding [place] results and would like a call back to discuss

## 2016-03-06 NOTE — Telephone Encounter (Signed)
Called patient back on 03/04/16 and left a message, have not heard from her yet.

## 2016-03-07 ENCOUNTER — Other Ambulatory Visit: Payer: Self-pay

## 2016-03-07 ENCOUNTER — Telehealth: Payer: Self-pay | Admitting: Gastroenterology

## 2016-03-07 DIAGNOSIS — R945 Abnormal results of liver function studies: Secondary | ICD-10-CM

## 2016-03-07 DIAGNOSIS — R7989 Other specified abnormal findings of blood chemistry: Secondary | ICD-10-CM

## 2016-03-11 ENCOUNTER — Ambulatory Visit (INDEPENDENT_AMBULATORY_CARE_PROVIDER_SITE_OTHER): Payer: Managed Care, Other (non HMO) | Admitting: Gastroenterology

## 2016-03-11 DIAGNOSIS — Z23 Encounter for immunization: Secondary | ICD-10-CM

## 2016-03-11 DIAGNOSIS — R932 Abnormal findings on diagnostic imaging of liver and biliary tract: Secondary | ICD-10-CM

## 2016-03-14 ENCOUNTER — Ambulatory Visit: Payer: BLUE CROSS/BLUE SHIELD | Admitting: Family Medicine

## 2016-03-20 ENCOUNTER — Ambulatory Visit (INDEPENDENT_AMBULATORY_CARE_PROVIDER_SITE_OTHER): Payer: BLUE CROSS/BLUE SHIELD | Admitting: Family Medicine

## 2016-03-20 ENCOUNTER — Encounter: Payer: Self-pay | Admitting: Family Medicine

## 2016-03-20 VITALS — BP 120/70 | HR 76 | Ht 63.25 in | Wt 253.0 lb

## 2016-03-20 DIAGNOSIS — R609 Edema, unspecified: Secondary | ICD-10-CM

## 2016-03-20 DIAGNOSIS — E118 Type 2 diabetes mellitus with unspecified complications: Secondary | ICD-10-CM | POA: Diagnosis not present

## 2016-03-20 DIAGNOSIS — E1165 Type 2 diabetes mellitus with hyperglycemia: Secondary | ICD-10-CM

## 2016-03-20 DIAGNOSIS — I10 Essential (primary) hypertension: Secondary | ICD-10-CM

## 2016-03-20 LAB — POCT GLYCOSYLATED HEMOGLOBIN (HGB A1C)

## 2016-03-20 MED ORDER — METFORMIN HCL 1000 MG PO TABS: 1000.0000 mg | ORAL_TABLET | Freq: Two times a day (BID) | ORAL | 2 refills | Status: DC

## 2016-03-20 MED ORDER — BENAZEPRIL HCL 20 MG PO TABS: 20.0000 mg | ORAL_TABLET | Freq: Every day | ORAL | 2 refills | Status: DC

## 2016-03-20 MED ORDER — EMPAGLIFLOZIN 10 MG PO TABS: 10.0000 mg | ORAL_TABLET | Freq: Every day | ORAL | 2 refills | Status: DC

## 2016-03-20 MED ORDER — HYDROCHLOROTHIAZIDE 25 MG PO TABS: 25.0000 mg | ORAL_TABLET | Freq: Every day | ORAL | 2 refills | Status: DC

## 2016-03-20 NOTE — Patient Instructions (Signed)
Call me in one week and give me your blood sugar readings. Call if you are not able to afford your medications.   Wear compression stockings, be more active and eat low salt diet for swelling in your feet and ankles. Let me know if this gets worse.   Follow up 3 months.

## 2016-03-20 NOTE — Progress Notes (Signed)
Subjective:    Patient ID: Victoria Padilla, female    DOB: Sep 19, 1965, 51 y.o.   MRN: [id number]  SHANDREKA DIPINTO is a 51 y.o. female who presents for follow-up of Type 2 diabetes mellitus. She reports that she has noticed bilateral LE edema for the past several months.  Has not been taking blood pressure medication, states she cannot afford the combination medication and requests that I send in two separate prescriptions so that they may be more affordable.  She is also requesting that I send in benazepril and HCTZ separately.  Denies fever, chills, headache, dizziness, chest pain, palpitations, shortness of breath, cough, orthopnea, abdominal pain, GI or GU symptoms.   States she does not feel like she has gained weight in spite of what the scales say. States her clothes are fitting the same.   Patient is checking home blood sugars.   Home blood sugar records: BGs range between 107 and 218 fasting.  How often is blood sugars being checked: twice a day  Current symptoms include: none. Patient denies nausea, visual disturbances, vomiting and weight loss.  Patient is checking their feet daily. Any Foot concerns (callous, ulcer, wound, thickened nails, toenail fungus, skin fungus, hammer toe): none Last dilated eye exam: last year  Current treatments: on medication but has not been taking them as prescribed.  Medication compliance: good has not been taking Synjardy.  Current diet: in general, a "healthy" diet   Current exercise: none  Known diabetic complications: none  The following portions of the patient's history were reviewed and updated as appropriate: allergies, current medications, past medical history, past social history and problem list.  ROS as in subjective above.     Objective:    Physical Exam Alert and in no distress.Cardiac exam shows a regular sinus rhythm without murmurs or gallops. Lungs are clear to auscultation. LE with trace edema, non pitting, normal pulses,  sensation and ROM.   Blood pressure 120/70, pulse 76, height 5' 3.25" (1.607 m), weight 253 lb (114.8 kg).  Lab Review Diabetic Labs Latest Ref Rng & Units 03/20/2016 01/23/2016 12/21/2015 12/14/2015 08/08/2015  HbA1c - 8.4% - - 9.1% 8.6(H)  Microalbumin Not estab mg/dL - - 1.3 - -  Micro/Creat Ratio <30 mcg/mg creat - - 15 - -  Chol <200 mg/dL - 127 - - 199  HDL >50 mg/dL - 42(L) - - 59  Calc LDL <100 mg/dL - 58 - - 116  Triglycerides <150 mg/dL - 137 - - 120  Creatinine 0.50 - 1.05 mg/dL - - - 0.75 0.82   BP/Weight 03/20/2016 02/20/2016 02/08/2016 12/21/2015 [id number]  Systolic BP 123456 0000000 123456 [id number]  Diastolic BP 70 86 80 80 80  Wt. (Lbs) 253 241.38 249.4 243.2 246.6  BMI 44.46 42.76 44.18 43.08 43.68   Foot/eye exam completion dates Latest Ref Rng & Units 09/26/2015 08/08/2015  Eye Exam No Retinopathy Retinopathy(A) -  Foot Form Completion - - Done    Severa  reports that she has never smoked. She has never used smokeless tobacco. She reports that she does not drink alcohol or use drugs.     Assessment & Plan:    Uncontrolled type 2 diabetes mellitus with complication, unspecified long term insulin use status (HCC) - Plan: HgB A1c, metFORMIN (GLUCOPHAGE) 1000 MG tablet, empagliflozin (JARDIANCE) 10 MG TABS tablet  Essential hypertension - Plan: benazepril (LOTENSIN) 20 MG tablet, hydrochlorothiazide (HYDRODIURIL) 25 MG tablet  Morbid obesity (HCC)  Peripheral edema  1. Rx changes: new  prescriptions sent to pharmacy. instead of Synjardy, she will take Metformin and Jardiance. samples of Jardiance 10 mg given #14 2. Education: Reviewed 'ABCs' of diabetes management (respective goals in parentheses):  A1C (<7), blood pressure (<130/80), and cholesterol (LDL <100). 3. Compliance at present is estimated to be fair. Efforts to improve compliance (if necessary) will be directed at dietary modifications: be aware of carbohydrates and limit sugar intake, increased exercise, regular blood  sugar monitoring: two times daily and take medication as prescribed. 4. Follow up: 3 months  5. Blood pressure is within goal, continue on medication regimen and watch sodium intake.  6. Exercise for weight loss in addition to healthy diet.  7. Compression stockings, increase activity and watch sodium intake for dependent edema.

## 2016-04-04 ENCOUNTER — Encounter: Payer: Self-pay | Admitting: Gastroenterology

## 2016-04-12 ENCOUNTER — Telehealth: Payer: Self-pay | Admitting: Gastroenterology

## 2016-04-12 ENCOUNTER — Ambulatory Visit (INDEPENDENT_AMBULATORY_CARE_PROVIDER_SITE_OTHER): Payer: BLUE CROSS/BLUE SHIELD | Admitting: Gastroenterology

## 2016-04-12 ENCOUNTER — Other Ambulatory Visit: Payer: Self-pay

## 2016-04-12 DIAGNOSIS — R748 Abnormal levels of other serum enzymes: Secondary | ICD-10-CM

## 2016-04-12 DIAGNOSIS — Z23 Encounter for immunization: Secondary | ICD-10-CM

## 2016-04-12 NOTE — Telephone Encounter (Signed)
Caryl Pina will you please take care of this note for patient since you were the one who saw her. Thank you.

## 2016-04-12 NOTE — Telephone Encounter (Signed)
Letter created. Will await fax number from pt.

## 2016-04-12 NOTE — Telephone Encounter (Signed)
Patient states she will CB with fax#

## 2016-04-15 ENCOUNTER — Encounter: Payer: Self-pay | Admitting: Gastroenterology

## 2016-04-15 ENCOUNTER — Ambulatory Visit (AMBULATORY_SURGERY_CENTER): Payer: BLUE CROSS/BLUE SHIELD | Admitting: Gastroenterology

## 2016-04-15 VITALS — BP 122/76 | HR 76 | Temp 97.7°F | Resp 25 | Ht 63.0 in | Wt 241.0 lb

## 2016-04-15 DIAGNOSIS — K635 Polyp of colon: Secondary | ICD-10-CM | POA: Diagnosis not present

## 2016-04-15 DIAGNOSIS — Z1211 Encounter for screening for malignant neoplasm of colon: Secondary | ICD-10-CM

## 2016-04-15 DIAGNOSIS — K2970 Gastritis, unspecified, without bleeding: Secondary | ICD-10-CM

## 2016-04-15 DIAGNOSIS — B9681 Helicobacter pylori [H. pylori] as the cause of diseases classified elsewhere: Secondary | ICD-10-CM | POA: Diagnosis not present

## 2016-04-15 DIAGNOSIS — D175 Benign lipomatous neoplasm of intra-abdominal organs: Secondary | ICD-10-CM | POA: Diagnosis not present

## 2016-04-15 DIAGNOSIS — R110 Nausea: Secondary | ICD-10-CM

## 2016-04-15 DIAGNOSIS — K625 Hemorrhage of anus and rectum: Secondary | ICD-10-CM

## 2016-04-15 MED ORDER — SODIUM CHLORIDE 0.9 % IV SOLN: 500.0000 mL | INTRAVENOUS | Status: DC

## 2016-04-15 NOTE — Op Note (Signed)
[name] Procedure Date: 04/15/2016 2:21 PM MRN: [id number] Endoscopist: Remo Lipps P. Armbruster MD, MD Age: 51 Referring MD:  Date of Birth: 1965-12-10 Gender: Female Account #: [id number] Procedure:                Upper GI endoscopy Indications:              Epigastric abdominal pain, Nausea Medicines:                Monitored Anesthesia Care Procedure:                Pre-Anesthesia Assessment:                           - Prior to the procedure, a History and Physical                            was performed, and patient medications and                            allergies were reviewed. The patient's tolerance of                            previous anesthesia was also reviewed. The risks                            and benefits of the procedure and the sedation                            options and risks were discussed with the patient.                            All questions were answered, and informed consent                            was obtained. Prior Anticoagulants: The patient has                            taken aspirin, last dose was 1 day prior to                            procedure. ASA Grade Assessment: III - A patient                            with severe systemic disease. After reviewing the                            risks and benefits, the patient was deemed in                            satisfactory condition to undergo the procedure.                           After obtaining informed consent, the endoscope was  passed under direct vision. Throughout the                            procedure, the patient's blood pressure, pulse, and                            oxygen saturations were monitored continuously. The                            Model [id number]-[id number]) scope was introduced                            through the mouth, and advanced to the second part                            of  duodenum. The upper GI endoscopy was                            accomplished without difficulty. The patient                            tolerated the procedure well. Scope In: Scope Out: 2:38:16 PM Findings:                 Esophagogastric landmarks were identified: the                            Z-line was found at 35 cm, the gastroesophageal                            junction was found at 35 cm and the upper extent of                            the gastric folds was found at 35 cm from the                            incisors.                           The exam of the esophagus was otherwise normal.                           Patchy mild inflammation characterized by erythema                            and granularity was found in the gastric antrum.                            Biopsies were taken with a cold forceps for                            Helicobacter pylori testing.                           The exam  of the stomach was otherwise normal.                           The duodenal bulb and second portion of the                            duodenum were normal. Complications:            No immediate complications. Estimated blood loss:                            Minimal. Estimated Blood Loss:     Estimated blood loss was minimal. Impression:               - Esophagogastric landmarks identified.                           - Normal esophagus                           - Gastritis. Biopsied to rule out H pylori.                           - Normal duodenal bulb and second portion of the                            duodenum. Recommendation:           - Patient has a contact number available for                            emergencies. The signs and symptoms of potential                            delayed complications were discussed with the                            patient. Return to normal activities tomorrow.                            Written discharge instructions were provided to the                             patient.                           - Resume previous diet.                           - Continue present medications.                           - Consider increasing omeprazole to twice daily                            dosing                           -  Continue zofran PRN if not yet tried                            (prescription is in system)                           - Await pathology results. Remo Lipps P. Armbruster MD, MD 04/15/2016 3:17:10 PM This report has been signed electronically.

## 2016-04-15 NOTE — Op Note (Signed)
[facility] Patient Name: Victoria Padilla Procedure Date: 04/15/2016 2:21 PM MRN: [id number] Endoscopist: Remo Lipps P. Armbruster MD, MD Age: 51 Referring MD:  Date of Birth: 06/26/1965 Gender: Female Account #: [id number] Procedure:                Colonoscopy Indications:              This is the patient's first colonoscopy,                            Hematochezia Medicines:                Monitored Anesthesia Care Procedure:                Pre-Anesthesia Assessment:                           - Prior to the procedure, a History and Physical                            was performed, and patient medications and                            allergies were reviewed. The patient's tolerance of                            previous anesthesia was also reviewed. The risks                            and benefits of the procedure and the sedation                            options and risks were discussed with the patient.                            All questions were answered, and informed consent                            was obtained. Prior Anticoagulants: The patient has                            taken aspirin, last dose was 1 day prior to                            procedure. ASA Grade Assessment: III - A patient                            with severe systemic disease. After reviewing the                            risks and benefits, the patient was deemed in                            satisfactory condition to undergo the procedure.  After obtaining informed consent, the colonoscope                            was passed under direct vision. Throughout the                            procedure, the patient's blood pressure, pulse, and                            oxygen saturations were monitored continuously. The                            Model [id number]-[id number]) scope was introduced                            through the anus and advanced to the the  cecum,                            identified by appendiceal orifice and ileocecal                            valve. The colonoscopy was performed without                            difficulty. The patient tolerated the procedure                            well. The quality of the bowel preparation was                            good. The ileocecal valve, appendiceal orifice, and                            rectum were photographed. Scope In: 2:41:10 PM Scope Out: [id number] PM Scope Withdrawal Time: 0 hours 18 minutes 12 seconds  Total Procedure Duration: 0 hours 25 minutes 8 seconds  Findings:                 The perianal and digital rectal examinations were                            normal.                           There was a medium-sized lipoma, in the ascending                            colon. Bite on bite biopsies were taken with a cold                            forceps for histology to confirm this suspicion,                            showing suspected fatty tissue within the lesion.  A few small-mouthed diverticula were found in the                            transverse colon and left colon.                           Internal hemorrhoids were found during                            retroflexion. The hemorrhoids were small.                           The colon was tortous with significant looping                            leading to the use of abdominal pressure to                            complete the exam. The exam was otherwise without                            abnormality. No polyps Complications:            No immediate complications. Estimated blood loss:                            Minimal. Estimated Blood Loss:     Estimated blood loss was minimal. Impression:               - Medium-sized lipoma in the ascending colon.                            Biopsied.                           - Diverticulosis in the transverse colon and in the                             left colon.                           - Internal hemorrhoids.                           - The examination was otherwise normal.                           Overall, suspect hemorrhoids is the likely etiology                            of the patient's symptoms Recommendation:           - Patient has a contact number available for                            emergencies. The signs and symptoms of potential  delayed complications were discussed with the                            patient. Return to normal activities tomorrow.                            Written discharge instructions were provided to the                            patient.                           - Resume previous diet.                           - Continue present medications.                           - Trial of daily fiber supplement as needed for                            hemorrhoids                           - Await pathology results. I doubt the lipoma is                            causing any symptoms at present size, likely does                            not warrant removal. Remo Lipps P. Armbruster MD, MD 04/15/2016 3:13:12 PM This report has been signed electronically.

## 2016-04-15 NOTE — Patient Instructions (Signed)
YOU HAD AN ENDOSCOPIC PROCEDURE TODAY AT [facility]:   Refer to the procedure report that was given to you for any specific questions about what was found during the examination.  If the procedure report does not answer your questions, please call your gastroenterologist to clarify.  If you requested that your care partner not be given the details of your procedure findings, then the procedure report has been included in a sealed envelope for you to review at your convenience later.  YOU SHOULD EXPECT: Some feelings of bloating in the abdomen. Passage of more gas than usual.  Walking can help get rid of the air that was put into your GI tract during the procedure and reduce the bloating. If you had a lower endoscopy (such as a colonoscopy or flexible sigmoidoscopy) you may notice spotting of blood in your stool or on the toilet paper. If you underwent a bowel prep for your procedure, you may not have a normal bowel movement for a few days.  Please Note:  You might notice some irritation and congestion in your nose or some drainage.  This is from the oxygen used during your procedure.  There is no need for concern and it should clear up in a day or so.  SYMPTOMS TO REPORT IMMEDIATELY:   Following lower endoscopy (colonoscopy or flexible sigmoidoscopy):  Excessive amounts of blood in the stool  Significant tenderness or worsening of abdominal pains  Swelling of the abdomen that is new, acute  Fever of 100F or higher   Following upper endoscopy (EGD)  Vomiting of blood or coffee ground material  New chest pain or pain under the shoulder blades  Painful or persistently difficult swallowing  New shortness of breath  Fever of 100F or higher  Black, tarry-looking stools  For urgent or emergent issues, a gastroenterologist can be reached at any hour by calling [phone].   DIET:  We do recommend a small meal at first, but then you may proceed to your regular diet.  Drink  plenty of fluids but you should avoid alcoholic beverages for 24 hours.Try to increase the fiber in your diet, and drink plenty of water.  ACTIVITY:  You should plan to take it easy for the rest of today and you should NOT DRIVE or use heavy machinery until tomorrow (because of the sedation medicines used during the test).    FOLLOW UP: Our staff will call the number listed on your records the next business day following your procedure to check on you and address any questions or concerns that you may have regarding the information given to you following your procedure. If we do not reach you, we will leave a message.  However, if you are feeling well and you are not experiencing any problems, there is no need to return our call.  We will assume that you have returned to your regular daily activities without incident.  If any biopsies were taken you will be contacted by phone or by letter within the next 1-3 weeks.  Please call us at [phone] if you have not heard about the biopsies in 3 weeks.    SIGNATURES/CONFIDENTIALITY: You and/or your care partner have signed paperwork which will be entered into your electronic medical record.  These signatures attest to the fact that that the information above on your After Visit Summary has been reviewed and is understood.  Full responsibility of the confidentiality of this discharge information lies with you and/or your care-partner.  Read all of the handouts given to you by your recovery room nurse. 

## 2016-04-15 NOTE — Progress Notes (Signed)
Called to room to assist during endoscopic procedure.  Patient ID and intended procedure confirmed with present staff. Received instructions for my participation in the procedure from the performing physician.  

## 2016-04-16 ENCOUNTER — Telehealth: Payer: Self-pay

## 2016-04-16 NOTE — Telephone Encounter (Signed)
-----   Message from Marlon Pel, RN sent at 04/16/2016  9:34 AM EST ----- Regarding: FW: open visit Look at this encounter please and see what is missing from it.  See message from Dr. Havery Moros ----- Message ----- From: Manus Gunning, MD Sent: 04/16/2016   9:14 AM To: Marlon Pel, RN Subject: open visit                                     Hi Sheri,  I have an open visit for hepatitis vaccine for this patient from 2/16. I can't see where the documentation was done, but it wasn't closed out and I can't see that any charges were placed for the vaccine.  Could you review this for me and help close it out? I don't know how to put in the vaccine information. This has been a recurrent issue and I'm sorry I keep going to you for assistance with it. If this happens to be a patient Caryl Pina is working with, could you please speak with her about it, as it has happened multiple times in the past and is frustrating to keep seeing this incomplete note messages. Thanks for your help.

## 2016-04-16 NOTE — Telephone Encounter (Signed)
Dr. Havery Moros, I have put in the charges and linked them to pts diagnosis. Hopefully you will be able to close the encounter. Sorry.

## 2016-04-16 NOTE — Telephone Encounter (Signed)
  Follow up Call-  Call back number 04/15/2016  Post procedure Call Back phone  # [phone]  Permission to leave phone message Yes  Some recent data might be hidden     Patient questions:  Do you have a fever, pain , or abdominal swelling? No. Pain Score  0 *  Have you tolerated food without any problems? Yes.    Have you been able to return to your normal activities? Yes.    Do you have any questions about your discharge instructions: Diet   No. Medications  No. Follow up visit  No.  Do you have questions or concerns about your Care? No.  Actions: * If pain score is 4 or above: No action needed, pain <4.

## 2016-04-17 NOTE — Telephone Encounter (Signed)
Thanks Caryl Pina, I closed it

## 2016-04-17 NOTE — Telephone Encounter (Signed)
Ok, I think this note is fixed. Please see if you can close it. Thanks.

## 2016-04-17 NOTE — Telephone Encounter (Signed)
-----   Message from Manus Gunning, MD sent at 04/16/2016 12:26 PM EST ----- Thanks Caryl Pina, I can see you put the charges in but I still can't sign the note, asking for level of service which I'm not sure what to put in for vaccine visit. We can discuss in clinic today. Thanks  ----- Message ----- From: Alphonzo Dublin, LPN Sent: [id number]  10:04 AM To: Manus Gunning, MD  Dr. Loni Muse,  Both charges have been ordered and associated with the diagnosis. It is still asking for a level of service which I am not able to put in. It will not let me sign the visit.

## 2016-04-19 ENCOUNTER — Other Ambulatory Visit: Payer: Self-pay

## 2016-04-19 DIAGNOSIS — A048 Other specified bacterial intestinal infections: Secondary | ICD-10-CM

## 2016-04-19 MED ORDER — BIS SUBCIT-METRONID-TETRACYC 140-125-125 MG PO CAPS: 3.0000 | ORAL_CAPSULE | Freq: Three times a day (TID) | ORAL | 0 refills | Status: DC

## 2016-04-19 NOTE — Progress Notes (Signed)
Ordered lab

## 2016-04-23 ENCOUNTER — Telehealth: Payer: Self-pay | Admitting: Gastroenterology

## 2016-04-24 NOTE — Telephone Encounter (Signed)
Pt had concerns about how to take reflux med with Pylera. When to submit a stool sample. Also, how long does she need to be off of omeprazole before giving stool sample?  Pt informed per Dr.Armbruster to double dose of Omeprazole and after 1 month she would need to submit a stool sample.  2 weeks prior to submitting the stool sample she should be off of the Omeprazole.  Pt understood and she did pick up her sample of Pylera.

## 2016-04-24 NOTE — Telephone Encounter (Signed)
Left message informing pt that there is a sample box of Pylera up front for her to pick up and to call back with any concerns.

## 2016-04-24 NOTE — Telephone Encounter (Signed)
Patient states that she has questions regarding this. Best # [phone]

## 2016-06-04 NOTE — Telephone Encounter (Signed)
done

## 2016-06-19 ENCOUNTER — Ambulatory Visit: Payer: BLUE CROSS/BLUE SHIELD | Admitting: Family Medicine

## 2016-07-01 ENCOUNTER — Ambulatory Visit: Payer: BLUE CROSS/BLUE SHIELD | Admitting: Family Medicine

## 2016-07-01 ENCOUNTER — Telehealth: Payer: Self-pay | Admitting: Internal Medicine

## 2016-07-01 NOTE — Telephone Encounter (Signed)

## 2016-07-01 NOTE — Telephone Encounter (Signed)
D. Schedule in the next 2-3 weeks for diabetes.

## 2016-07-03 ENCOUNTER — Encounter: Payer: Self-pay | Admitting: Family Medicine

## 2016-07-03 NOTE — Telephone Encounter (Signed)
No show letter with appt request sent.  °

## 2016-09-04 ENCOUNTER — Ambulatory Visit (HOSPITAL_COMMUNITY)
Admission: EM | Admit: 2016-09-04 | Discharge: 2016-09-04 | Disposition: A | Discharge status: Home / Self Care | Payer: 59 | Attending: Family Medicine | Admitting: Family Medicine

## 2016-09-04 ENCOUNTER — Encounter (HOSPITAL_COMMUNITY): Payer: Self-pay | Admitting: Family Medicine

## 2016-09-04 DIAGNOSIS — R05 Cough: Secondary | ICD-10-CM

## 2016-09-04 DIAGNOSIS — J40 Bronchitis, not specified as acute or chronic: Secondary | ICD-10-CM | POA: Diagnosis not present

## 2016-09-04 DIAGNOSIS — R059 Cough, unspecified: Secondary | ICD-10-CM

## 2016-09-04 MED ORDER — AZITHROMYCIN 250 MG PO TABS: 250.0000 mg | ORAL_TABLET | Freq: Every day | ORAL | 0 refills | Status: DC

## 2016-09-04 MED ORDER — METHYLPREDNISOLONE 4 MG PO TBPK: ORAL_TABLET | ORAL | 0 refills | Status: DC

## 2016-09-04 MED ORDER — BENZONATATE 100 MG PO CAPS: 200.0000 mg | ORAL_CAPSULE | Freq: Three times a day (TID) | ORAL | 0 refills | Status: DC | PRN

## 2016-09-04 NOTE — ED Provider Notes (Signed)
CSN: [id number]     Arrival date & time 09/04/16  1108 History   None    Chief Complaint  Patient presents with  . URI   (Consider location/radiation/quality/duration/timing/severity/associated sxs/prior Treatment) HPI  Past Medical History:  Diagnosis Date  . Acid reflux   . Anemia   . Arthritis   . Cervical dysplasia   . Diabetes mellitus   . Heart murmur   . Hyperlipidemia   . Hypertension   . LBP (low back pain)   . Obesity   . Sarcoidosis   . Vitamin D deficiency    Past Surgical History:  Procedure Laterality Date  . BUNIONECTOMY    . CHOLECYSTECTOMY    . COLPOSCOPY    . GYNECOLOGIC CRYOSURGERY    . KNEE ARTHROSCOPY    . TUBAL LIGATION     Family History  Problem Relation Age of Onset  . Heart failure Mother 64       chf  . Heart failure Father   . Cancer Brother 71       prostate  . Hyperlipidemia Brother   . Hypertension Brother   . Diabetes Brother   . Hypertension Maternal Uncle   . Hyperlipidemia Brother   . Diabetes Brother   . Colon cancer Maternal Grandfather   . Colon polyps Brother   . Heart disease Brother   . Stomach cancer Neg Hx   . Esophageal cancer Neg Hx   . Rectal cancer Neg Hx   . Liver cancer Neg Hx    Social History  Substance Use Topics  . Smoking status: Never Smoker  . Smokeless tobacco: Never Used  . Alcohol use No   OB History    Gravida Para Term Preterm AB Living   3 3 3     3    SAB TAB Ectopic Multiple Live Births                 Review of Systems  Allergies  Naprosyn [naproxen]  Home Medications   Prior to Admission medications   Medication Sig Start Date End Date Taking? Authorizing Provider  albuterol (PROVENTIL HFA;VENTOLIN HFA) 108 (90 Base) MCG/ACT inhaler Inhale 2 puffs into the lungs every 6 (six) hours as needed for wheezing or shortness of breath. 02/08/16   Henson, [name] L, NP-C  aspirin 81 MG tablet Take 1 tablet (81 mg total) by mouth daily. Reported on 08/08/2015 08/09/15   Tysinger, Camelia Eng,  PA-C  azithromycin (ZITHROMAX) 250 MG tablet Take 1 tablet (250 mg total) by mouth daily. Take first 2 tablets together, then 1 every day until finished. 09/04/16   Lysbeth Penner, FNP  BAYER MICROLET LANCETS lancets Test twice a day 03/03/15   Harland Dingwall L, NP-C  benazepril (LOTENSIN) 20 MG tablet Take 1 tablet (20 mg total) by mouth daily. 03/20/16   Henson, [name] L, NP-C  benzonatate (TESSALON) 100 MG capsule Take 2 capsules (200 mg total) by mouth 3 (three) times daily as needed for cough. 09/04/16   Lysbeth Penner, FNP  bismuth-metronidazole-tetracycline [facility]) [phone] MG capsule Take 3 capsules by mouth 4 (four) times daily -  before meals and at bedtime. 04/19/16   Armbruster, Renelda Loma, MD  cholecalciferol (VITAMIN D) 1000 units tablet Take 2,000 Units by mouth daily.    [provider]  empagliflozin (JARDIANCE) 10 MG TABS tablet Take 10 mg by mouth daily. 03/20/16   Henson, [name] L, NP-C  glipiZIDE (GLUCOTROL) 10 MG tablet TAKE ONE TABLET BY MOUTH  ONCE DAILY BEFORE BREAKFAST 02/05/16   Tysinger, Camelia Eng, PA-C  glucose blood (BAYER CONTOUR NEXT TEST) test strip Test twice a day. 12/14/15   Henson, [name] L, NP-C  hydrochlorothiazide (HYDRODIURIL) 25 MG tablet Take 1 tablet (25 mg total) by mouth daily. 03/20/16   Henson, [name] L, NP-C  metFORMIN (GLUCOPHAGE) 1000 MG tablet Take 1 tablet (1,000 mg total) by mouth 2 (two) times daily with a meal. 03/20/16   Henson, [name] L, NP-C  methylPREDNISolone (MEDROL DOSEPAK) 4 MG TBPK tablet Take 6-5-4-3-2-1 po qd 09/04/16   Lysbeth Penner, FNP  Multiple Vitamins-Minerals (MULTIVITAMIN PO) Take 2 capsules by mouth 2 (two) times daily. Reported on 03/10/2015 06/11/13   [provider]  Omega-3 Fatty Acids (FISH OIL PO) Take 1 capsule by mouth daily. Reported on 08/08/2015    [provider]  simvastatin (ZOCOR) 20 MG tablet Take 1 tablet (20 mg total) by mouth at bedtime. 12/21/15   Henson, [name] L, NP-C   Meds  Ordered and Administered this Visit  Medications - No data to display  BP (!) 172/90   Pulse 78   Temp 98.6 F (37 C)   Resp 18   SpO2 98%  No data found.   Physical Exam  Urgent Care Course     Procedures (including critical care time)  Labs Review Labs Reviewed - No data to display  Imaging Review No results found.   Visual Acuity Review  Right Eye Distance:   Left Eye Distance:   Bilateral Distance:    Right Eye Near:   Left Eye Near:    Bilateral Near:         MDM   1. Bronchitis   2. Cough    Medrol dose pack as directed 4mg  #21 Tessalon Perles 200mg  one po tid prn Zpak  Push po fluids, rest, tylenol and motrin otc prn as directed for fever, arthralgias, and myalgias.  Follow up prn if sx's continue or persist.    Lysbeth Penner, FNP 09/04/16 1240

## 2016-09-04 NOTE — ED Triage Notes (Signed)
Pt here for URI symptoms x 2 weeks.

## 2016-11-06 ENCOUNTER — Telehealth: Payer: Self-pay | Admitting: Family Medicine

## 2016-11-06 NOTE — Telephone Encounter (Signed)
Called pt reached vm lmtrc.  Needs diabetic ov.

## 2016-11-27 ENCOUNTER — Telehealth: Payer: Self-pay | Admitting: Medical

## 2016-11-27 ENCOUNTER — Ambulatory Visit (HOSPITAL_COMMUNITY)
Admission: EM | Admit: 2016-11-27 | Discharge: 2016-11-27 | Disposition: A | Discharge status: Home / Self Care | Payer: 59 | Attending: Family Medicine | Admitting: Family Medicine

## 2016-11-27 ENCOUNTER — Encounter (HOSPITAL_COMMUNITY): Payer: Self-pay | Admitting: Emergency Medicine

## 2016-11-27 DIAGNOSIS — S29019A Strain of muscle and tendon of unspecified wall of thorax, initial encounter: Secondary | ICD-10-CM

## 2016-11-27 DIAGNOSIS — S39013A Strain of muscle, fascia and tendon of pelvis, initial encounter: Secondary | ICD-10-CM | POA: Diagnosis not present

## 2016-11-27 MED ORDER — NAPROXEN 375 MG PO TBEC
DELAYED_RELEASE_TABLET | ORAL | 0 refills | Status: DC
Start: 2016-11-27 — End: 2016-12-04

## 2016-11-27 NOTE — Telephone Encounter (Signed)
Recv'd fax from [facility] stating review of pt's history indicated pt may benefit from Statin therapy

## 2016-11-27 NOTE — ED Provider Notes (Signed)
[place]    CSN: [id number] Arrival date & time: 11/27/16  1003     History   Chief Complaint Chief Complaint  Patient presents with  . Back Pain  . Hip Pain    HPI Victoria Padilla is a 51 y.o. female.   51 year old female complaining of pain to the right parathoracic muscles just inferior to the scapula. Also complaining of right inguinal pain. This is worse with ambulation and lifting the right leg. Denies any known trauma. No falls. Denies any known type of work that may have caused her to have pain.      Past Medical History:  Diagnosis Date  . Acid reflux   . Anemia   . Arthritis   . Cervical dysplasia   . Diabetes mellitus   . Heart murmur   . Hyperlipidemia   . Hypertension   . LBP (low back pain)   . Obesity   . Sarcoidosis   . Vitamin D deficiency     Patient Active Problem List   Diagnosis Date Noted  . GERD (gastroesophageal reflux disease) 12/21/2015  . Personal history of noncompliance with medical treatment, presenting hazards to health 12/21/2015  . Morbid obesity ([place]) 03/03/2015  . History of anemia 03/03/2015  . Hyperlipidemia associated with type 2 diabetes mellitus ([place]) 03/03/2015  . Essential hypertension 03/03/2015  . 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 Uncontrolled type 2 diabetes mellitus with complication ([place])   . Arthritis   . Cervical dysplasia   . Hypertension   . Sarcoidosis   . Hyperlipidemia   . Obesity     Past Surgical History:  Procedure Laterality Date  . BUNIONECTOMY    . CHOLECYSTECTOMY    . COLPOSCOPY    . GYNECOLOGIC CRYOSURGERY    . KNEE ARTHROSCOPY    . TUBAL LIGATION      OB History    Gravida Para Term Preterm AB Living   3 3 3     3    SAB TAB Ectopic Multiple Live Births                   Home Medications    Prior to Admission medications   Medication Sig Start Date End Date Taking? Authorizing Provider  aspirin 81 MG tablet Take 1 tablet  (81 mg total) by mouth daily. Reported on 08/08/2015 08/09/15  Yes Tysinger, Camelia Eng, PA-C  benazepril (LOTENSIN) 20 MG tablet Take 1 tablet (20 mg total) by mouth daily. 03/20/16  Yes Henson, [name] L, NP-C  albuterol (PROVENTIL HFA;VENTOLIN HFA) 108 (90 Base) MCG/ACT inhaler Inhale 2 puffs into the lungs every 6 (six) hours as needed for wheezing or shortness of breath. 02/08/16   [name], NP-C  BAYER MICROLET LANCETS lancets Test twice a day 03/03/15   Harland Dingwall L, NP-C  benzonatate (TESSALON) 100 MG capsule Take 2 capsules (200 mg total) by mouth 3 (three) times daily as needed for cough. 09/04/16   Lysbeth Penner, FNP  bismuth-metronidazole-tetracycline [facility]) [phone] MG capsule Take 3 capsules by mouth 4 (four) times daily -  before meals and at bedtime. 04/19/16   Armbruster, Carlota Raspberry, MD  cholecalciferol (VITAMIN D) 1000 units tablet Take 2,000 Units by mouth daily.    [provider]  empagliflozin (JARDIANCE) 10 MG TABS tablet Take 10 mg by mouth daily. 03/20/16   Henson, [name] L, NP-C  glipiZIDE (GLUCOTROL) 10 MG tablet TAKE ONE TABLET BY MOUTH ONCE DAILY  BEFORE BREAKFAST 02/05/16   Tysinger, Camelia Eng, PA-C  glucose blood (BAYER CONTOUR NEXT TEST) test strip Test twice a day. 12/14/15   Henson, [name] L, NP-C  hydrochlorothiazide (HYDRODIURIL) 25 MG tablet Take 1 tablet (25 mg total) by mouth daily. 03/20/16   Henson, [name] L, NP-C  metFORMIN (GLUCOPHAGE) 1000 MG tablet Take 1 tablet (1,000 mg total) by mouth 2 (two) times daily with a meal. 03/20/16   Henson, [name] L, NP-C  methylPREDNISolone (MEDROL DOSEPAK) 4 MG TBPK tablet Take 6-5-4-3-2-1 po qd 09/04/16   Lysbeth Penner, FNP  Multiple Vitamins-Minerals (MULTIVITAMIN PO) Take 2 capsules by mouth 2 (two) times daily. Reported on 03/10/2015 06/11/13   [provider]  Naproxen 375 MG TBEC Take 1 tablet with food twice a day. 11/27/16   Janne Napoleon, NP  Omega-3 Fatty Acids (FISH OIL PO) Take 1 capsule by  mouth daily. Reported on 08/08/2015    [provider]  simvastatin (ZOCOR) 20 MG tablet Take 1 tablet (20 mg total) by mouth at bedtime. 12/21/15   [name], NP-C    Family History Family History  Problem Relation Age of Onset  . Heart failure Mother 31       chf  . Heart failure Father   . Cancer Brother 19       prostate  . Hyperlipidemia Brother   . Hypertension Brother   . Diabetes Brother   . Hypertension Maternal Uncle   . Hyperlipidemia Brother   . Diabetes Brother   . Colon cancer Maternal Grandfather   . Colon polyps Brother   . Heart disease Brother   . Stomach cancer Neg Hx   . Esophageal cancer Neg Hx   . Rectal cancer Neg Hx   . Liver cancer Neg Hx     Social History Social History  Substance Use Topics  . Smoking status: Never Smoker  . Smokeless tobacco: Never Used  . Alcohol use No     Allergies   Naprosyn [naproxen]   Review of Systems Review of Systems  Constitutional: Negative for activity change, chills and fever.  HENT: Negative.   Respiratory: Negative.   Cardiovascular: Negative.   Musculoskeletal:       As per HPI  Skin: Negative for color change, pallor and rash.  Neurological: Negative.   All other systems reviewed and are negative.    Physical Exam Triage Vital Signs ED Triage Vitals  Enc Vitals Group     BP 11/27/16 1025 (!) 189/105     Pulse Rate 11/27/16 1025 99     Resp 11/27/16 1025 16     Temp 11/27/16 1025 97.7 F (36.5 C)     Temp Source 11/27/16 1025 Oral     SpO2 11/27/16 1025 95 %     Weight 11/27/16 1022 250 lb (113.4 kg)     Height 11/27/16 1022 5\' 3"  (1.6 m)     Head Circumference --      Peak Flow --      Pain Score 11/27/16 1022 10     Pain Loc --      Pain Edu? --      Excl. in [place]? --    No data found.   Updated Vital Signs BP (!) 189/105   Pulse 99   Temp 97.7 F (36.5 C) (Oral)   Resp 16   Ht 5\' 3"  (1.6 m)   Wt 250 lb (113.4 kg)   SpO2 95%   BMI 44.29 kg/m  Visual  Acuity Right Eye Distance:   Left Eye Distance:   Bilateral Distance:    Right Eye Near:   Left Eye Near:    Bilateral Near:     Physical Exam  Constitutional: She is oriented to person, place, and time. She appears well-developed and well-nourished. No distress.  HENT:  Head: Normocephalic and atraumatic.  Eyes: EOM are normal.  Neck: Normal range of motion. Neck supple.  Pulmonary/Chest: Effort normal and breath sounds normal.  Musculoskeletal: She exhibits tenderness. She exhibits no edema or deformity.  Tenderness to the right anterior inguinal musculature. Straight leg raise reproduces the inguinal pain.   Tenderness to the right parathoracicMusculature and musculature inferior to the scapula.  Neurological: She is alert and oriented to person, place, and time. No cranial nerve deficit.  Skin: Skin is warm and dry.  Nursing note and vitals reviewed.    UC Treatments / Results  Labs (all labs ordered are listed, but only abnormal results are displayed) Labs Reviewed - No data to display  EKG  EKG Interpretation None       Radiology No results found.  Procedures Procedures (including critical care time)  Medications Ordered in UC Medications - No data to display   Initial Impression / Assessment and Plan / UC Course  I have reviewed the triage vital signs and the nursing notes.  Pertinent labs & imaging results that were available during my care of the patient were reviewed by me and considered in my medical decision making (see chart for details).    Apply heat to the areas of pain. Perform the stretches to the upper back as demonstrated. Shortening or steps with the right leg to limit the amount of pain in the muscles in front of your groin. Take the Naprosyn EC with food.  Patient notes that she does not have a true allergy to Naprosyn. She states playing Naprosyn tends to upset her stomach. We discussed the option of Naprosyn EC and taking it with foodto  help prevent stomach upset. Patient agreed to try this.   Final Clinical Impressions(s) / UC Diagnoses   Final diagnoses:  Inguinal muscle strain, initial encounter  Thoracic myofascial strain, initial encounter    New Prescriptions New Prescriptions   NAPROXEN 375 MG TBEC    Take 1 tablet with food twice a day.     Controlled Substance Prescriptions [place] Controlled Substance Registry consulted? Not Applicable   Janne Napoleon, NP 11/27/16 1113

## 2016-11-27 NOTE — ED Triage Notes (Signed)
PT reports pain under right shoulder blade and in right hip. PT reports pain started  Sunday. No known injury or strain.

## 2016-11-27 NOTE — Telephone Encounter (Signed)
This one came to me in error

## 2016-11-27 NOTE — Telephone Encounter (Signed)
Patient has been contacted to schedule a diabetic ov but has not.

## 2016-11-27 NOTE — Discharge Instructions (Signed)
Apply heat to the areas of pain. Perform the stretches to the upper back as demonstrated. Shortening or steps with the right leg to limit the amount of pain in the muscles in front of your groin. Take the Naprosyn EC with food.

## 2016-12-04 ENCOUNTER — Ambulatory Visit (INDEPENDENT_AMBULATORY_CARE_PROVIDER_SITE_OTHER): Payer: 59 | Admitting: Family Medicine

## 2016-12-04 ENCOUNTER — Encounter: Payer: Self-pay | Admitting: Family Medicine

## 2016-12-04 VITALS — BP 140/90 | HR 87 | Ht 63.0 in | Wt 253.4 lb

## 2016-12-04 DIAGNOSIS — I10 Essential (primary) hypertension: Secondary | ICD-10-CM

## 2016-12-04 DIAGNOSIS — E785 Hyperlipidemia, unspecified: Secondary | ICD-10-CM

## 2016-12-04 DIAGNOSIS — IMO0002 Reserved for concepts with insufficient information to code with codable children: Secondary | ICD-10-CM

## 2016-12-04 DIAGNOSIS — E1165 Type 2 diabetes mellitus with hyperglycemia: Secondary | ICD-10-CM | POA: Diagnosis not present

## 2016-12-04 DIAGNOSIS — Z23 Encounter for immunization: Secondary | ICD-10-CM

## 2016-12-04 DIAGNOSIS — E1139 Type 2 diabetes mellitus with other diabetic ophthalmic complication: Secondary | ICD-10-CM

## 2016-12-04 DIAGNOSIS — E1169 Type 2 diabetes mellitus with other specified complication: Secondary | ICD-10-CM

## 2016-12-04 DIAGNOSIS — Z9119 Patient's noncompliance with other medical treatment and regimen: Secondary | ICD-10-CM

## 2016-12-04 DIAGNOSIS — Z91199 Patient's noncompliance with other medical treatment and regimen due to unspecified reason: Secondary | ICD-10-CM

## 2016-12-04 LAB — POCT GLYCOSYLATED HEMOGLOBIN (HGB A1C)

## 2016-12-04 MED ORDER — BENAZEPRIL HCL 20 MG PO TABS: 20.0000 mg | ORAL_TABLET | Freq: Every day | ORAL | 2 refills | Status: DC

## 2016-12-04 MED ORDER — SIMVASTATIN 20 MG PO TABS: 20.0000 mg | ORAL_TABLET | Freq: Every day | ORAL | 2 refills | Status: DC

## 2016-12-04 MED ORDER — DAPAGLIFLOZIN PROPANEDIOL 5 MG PO TABS
5.0000 mg | ORAL_TABLET | Freq: Every day | ORAL | 0 refills | Status: DC
Start: 2016-12-04 — End: 2017-01-07

## 2016-12-04 MED ORDER — METFORMIN HCL 1000 MG PO TABS
1000.0000 mg | ORAL_TABLET | Freq: Two times a day (BID) | ORAL | 2 refills | Status: DC
Start: 2016-12-04 — End: 2017-01-29

## 2016-12-04 MED ORDER — GLIPIZIDE 10 MG PO TABS: ORAL_TABLET | ORAL | 2 refills | Status: DC

## 2016-12-04 MED ORDER — HYDROCHLOROTHIAZIDE 25 MG PO TABS: 25.0000 mg | ORAL_TABLET | Freq: Every day | ORAL | 2 refills | Status: DC

## 2016-12-04 NOTE — Progress Notes (Signed)
Subjective:    Patient ID: Victoria Padilla, female    DOB: 1965-08-23, 51 y.o.   MRN: [id number]  Victoria Padilla is a 51 y.o. female who presents for follow-up of Type 2 diabetes mellitus and other chronic health conditions.   HTN- has not been checking her BP at home. States she ran out of medications for her HTN 2-3 months ago and did not let me know.    States she has not been taking her simvastatin. Denies any adverse reaction and gives no particular reason as to why she stopped it.   States she recently went to [facility] for right lateral hip pain. States she is not noticing any improvement.   Patient is not checking home blood sugars.   Home blood sugar records: patient does not check sugars How often is blood sugars being checked: none   Current symptoms include: none. Patient denies nausea, visual disturbances, vomiting and weight loss.  Patient is checking their feet daily. Any Foot concerns (callous, ulcer, wound, thickened nails, toenail fungus, skin fungus, hammer toe): swelling in feet and sore on left toe Last dilated eye exam: last year and overdue. Retinopathy.   Current treatments: occasional medication use.  Medication compliance: not been taking med on a regular basis. Has not called or come in for refills.  Current diet: in general, a "healthy" diet   Current exercise: none Known diabetic complications: retinopathy   The following portions of the patient's history were reviewed and updated as appropriate: allergies, current medications, past medical history, past social history and problem list.  ROS as in subjective above.     Objective:    Physical Exam Alert and in no distress. Cardiac exam shows a regular sinus rhythm without murmurs or gallops. Lungs are clear to auscultation. Foot exam done and she has a superficial abrasion on the dorsal aspect of her left great toe. No sign of infection. Normal foot exam otherwise.    Blood pressure 140/90, pulse 87, height  5\' 3"  (1.6 m), weight 253 lb 6.4 oz (114.9 kg).  Lab Review Diabetic Labs Latest Ref Rng & Units 12/04/2016 03/20/2016 01/23/2016 12/21/2015 12/14/2015  HbA1c - 10.3% 8.4% - - 9.1%  Microalbumin Not estab mg/dL - - - 1.3 -  Micro/Creat Ratio <30 mcg/mg creat - - - 15 -  Chol <200 mg/dL - - 127 - -  HDL >50 mg/dL - - 42(L) - -  Calc LDL <100 mg/dL - - 58 - -  Triglycerides <150 mg/dL - - 137 - -  Creatinine 0.50 - 1.05 mg/dL - - - - 0.75   BP/Weight 12/04/2016 11/27/2016 09/04/2016 04/15/2016 03/29/5425  Systolic BP 062 376 283 151 761  Diastolic BP 90 607 90 76 70  Wt. (Lbs) 253.4 250 - 241 253  BMI 44.89 44.29 - 42.69 44.46   Foot/eye exam completion dates Latest Ref Rng & Units 12/04/2016 09/26/2015  Eye Exam No Retinopathy - Retinopathy(A)  Foot Form Completion - Done -    Victoria Padilla  reports that she has never smoked. She has never used smokeless tobacco. She reports that she does not drink alcohol or use drugs.     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 ([place]) - Plan: CBC with Differential/Platelet, COMPLETE METABOLIC PANEL WITH GFR, Microalbumin / creatinine urine ratio, Lipid panel, POCT glycosylated hemoglobin (Hb A1C)  Essential hypertension - Plan: CBC with Differential/Platelet, COMPLETE METABOLIC PANEL WITH GFR, Microalbumin / creatinine urine ratio, Lipid panel,  benazepril (LOTENSIN) 20 MG tablet, hydrochlorothiazide (HYDRODIURIL) 25 MG tablet  Hyperlipidemia associated with type 2 diabetes mellitus ([place]) - Plan: Lipid panel  Morbid obesity ([place])  Personal history of noncompliance with medical treatment, presenting hazards to health  Needs flu shot - Plan: Flu Vaccine QUAD 36+ mos IM  1. Rx changes: Hgb A1c 10.3%  plan to have her start back on Metformin and glipizide and add [name].  2. Education: Reviewed 'ABCs' of diabetes management (respective goals in parentheses):  A1C (<7), blood pressure (<130/80), and cholesterol (LDL  <100). 3. Compliance at present is estimated to be poor. Efforts to improve compliance (if necessary) will be directed at dietary modifications: cut back on carbohydrates, increased exercise and regular blood sugar monitoring: daily. 4. HTN- uncontrolled and poor medication compliance. She will start back on medications and keep an eye on her BP at home. Bring in cuff and readings to her next appointment.  5. Counseled on health risks of morbid obesity.  6. Hyperlipidemia. Will check lipids and have her start back on statin.  7. Urine microalbumin ordered.  8. Foot exam done. See notes 9. Recommend that she call and get a diabetic eye exam.  10. Counseled on risks of noncompliance including heart attack, stroke and even death.  11. Follow up: 4 weeks

## 2016-12-04 NOTE — Patient Instructions (Signed)
Start back on your medications. I am adding Wilder Glade for your diabetes.  Start checking your blood sugars and blood pressure.  Goal fasting blood sugars 80-130.   Goal blood pressure is <130/80  We will call you with lab results.   Return in 4 weeks to follow up on diabetes and hypertension.  Please bring in your blood pressure cuff and all of your blood pressure and blood sugar readings.    Carbohydrate Counting for Diabetes Mellitus, Adult Carbohydrate counting is a method for keeping track of how many carbohydrates you eat. Eating carbohydrates naturally increases the amount of sugar (glucose) in the blood. Counting how many carbohydrates you eat helps keep your blood glucose within normal limits, which helps you manage your diabetes (diabetes mellitus). It is important to know how many carbohydrates you can safely have in each meal. This is different for every person. A diet and nutrition specialist (registered dietitian) can help you make a meal plan and calculate how many carbohydrates you should have at each meal and snack. Carbohydrates are found in the following foods:  Grains, such as breads and cereals.  Dried beans and soy products.  Starchy vegetables, such as potatoes, peas, and corn.  Fruit and fruit juices.  Milk and yogurt.  Sweets and snack foods, such as cake, cookies, candy, chips, and soft drinks.  How do I count carbohydrates? There are two ways to count carbohydrates in food. You can use either of the methods or a combination of both. Reading "Nutrition Facts" on packaged food The "Nutrition Facts" list is included on the labels of almost all packaged foods and beverages in the [place] It includes:  The serving size.  Information about nutrients in each serving, including the grams (g) of carbohydrate per serving.  To use the "Nutrition Facts":  Decide how many servings you will have.  Multiply the number of servings by the number of carbohydrates per  serving.  The resulting number is the total amount of carbohydrates that you will be having.  Learning standard serving sizes of other foods When you eat foods containing carbohydrates that are not packaged or do not include "Nutrition Facts" on the label, you need to measure the servings in order to count the amount of carbohydrates:  Measure the foods that you will eat with a food scale or measuring cup, if needed.  Decide how many standard-size servings you will eat.  Multiply the number of servings by 15. Most carbohydrate-rich foods have about 15 g of carbohydrates per serving. ? For example, if you eat 8 oz (170 g) of strawberries, you will have eaten 2 servings and 30 g of carbohydrates (2 servings x 15 g = 30 g).  For foods that have more than one food mixed, such as soups and casseroles, you must count the carbohydrates in each food that is included.  The following list contains standard serving sizes of common carbohydrate-rich foods. Each of these servings has about 15 g of carbohydrates:   hamburger bun or  English muffin.   oz (15 mL) syrup.   oz (14 g) jelly.  1 slice of bread.  1 six-inch tortilla.  3 oz (85 g) cooked rice or pasta.  4 oz (113 g) cooked dried beans.  4 oz (113 g) starchy vegetable, such as peas, corn, or potatoes.  4 oz (113 g) hot cereal.  4 oz (113 g) mashed potatoes or  of a large baked potato.  4 oz (113 g) canned or frozen fruit.  4 oz (120 mL) fruit juice.  4-6 crackers.  6 chicken nuggets.  6 oz (170 g) unsweetened dry cereal.  6 oz (170 g) plain fat-free yogurt or yogurt sweetened with artificial sweeteners.  8 oz (240 mL) milk.  8 oz (170 g) fresh fruit or one small piece of fruit.  24 oz (680 g) popped popcorn.  Example of carbohydrate counting Sample meal  3 oz (85 g) chicken breast.  6 oz (170 g) brown rice.  4 oz (113 g) corn.  8 oz (240 mL) milk.  8 oz (170 g) strawberries with sugar-free whipped  topping. Carbohydrate calculation 1. Identify the foods that contain carbohydrates: ? Rice. ? Corn. ? Milk. ? Strawberries. 2. Calculate how many servings you have of each food: ? 2 servings rice. ? 1 serving corn. ? 1 serving milk. ? 1 serving strawberries. 3. Multiply each number of servings by 15 g: ? 2 servings rice x 15 g = 30 g. ? 1 serving corn x 15 g = 15 g. ? 1 serving milk x 15 g = 15 g. ? 1 serving strawberries x 15 g = 15 g. 4. Add together all of the amounts to find the total grams of carbohydrates eaten: ? 30 g + 15 g + 15 g + 15 g = 75 g of carbohydrates total. This information is not intended to replace advice given to you by your health care provider. Make sure you discuss any questions you have with your health care provider. Document Released: 02/11/2005 Document Revised: 09/01/2015 Document Reviewed: 07/26/2015 Elsevier Interactive Patient Education  [employer].

## 2016-12-05 ENCOUNTER — Other Ambulatory Visit: Payer: Self-pay | Admitting: Family Medicine

## 2016-12-05 LAB — CBC WITH DIFFERENTIAL/PLATELET
BASOS ABS: 63 {cells}/uL (ref 0–200)
BASOS PCT: 1 %
Eosinophils Absolute: 82 cells/uL (ref 15–500)
Eosinophils Relative: 1.3 %
HCT: 42.2 % (ref 35.0–45.0)
HEMOGLOBIN: 12.9 g/dL (ref 11.7–15.5)
Lymphs Abs: 3320 cells/uL (ref 850–3900)
MCH: 22.1 pg — AB (ref 27.0–33.0)
MCHC: 30.6 g/dL — AB (ref 32.0–36.0)
MCV: 72.1 fL — AB (ref 80.0–100.0)
MONOS PCT: 7.6 %
MPV: 13.1 fL — ABNORMAL HIGH (ref 7.5–12.5)
Neutro Abs: 2356 cells/uL (ref 1500–7800)
Neutrophils Relative %: 37.4 %
PLATELETS: 263 10*3/uL (ref 140–400)
RBC: 5.85 10*6/uL — ABNORMAL HIGH (ref 3.80–5.10)
RDW: 15.5 % — ABNORMAL HIGH (ref 11.0–15.0)
TOTAL LYMPHOCYTE: 52.7 %
WBC: 6.3 10*3/uL (ref 3.8–10.8)
WBCMIX: 479 {cells}/uL (ref 200–950)

## 2016-12-05 LAB — COMPLETE METABOLIC PANEL WITH GFR
AG RATIO: 1.1 (calc) (ref 1.0–2.5)
ALBUMIN MSPROF: 3.9 g/dL (ref 3.6–5.1)
ALKALINE PHOSPHATASE (APISO): 113 U/L (ref 33–130)
ALT: 49 U/L — ABNORMAL HIGH (ref 6–29)
AST: 37 U/L — ABNORMAL HIGH (ref 10–35)
BILIRUBIN TOTAL: 0.4 mg/dL (ref 0.2–1.2)
BUN: 13 mg/dL (ref 7–25)
CHLORIDE: 102 mmol/L (ref 98–110)
CO2: 26 mmol/L (ref 20–32)
Calcium: 9.5 mg/dL (ref 8.6–10.4)
Creat: 0.66 mg/dL (ref 0.50–1.05)
GFR, EST AFRICAN AMERICAN: 117 mL/min/{1.73_m2} (ref >=60)
GFR, Est Non African American: 101 mL/min/{1.73_m2} (ref >=60)
GLUCOSE: 218 mg/dL — AB (ref 65–99)
Globulin: 3.7 g/dL (calc) (ref 1.9–3.7)
POTASSIUM: 4.6 mmol/L (ref 3.5–5.3)
Sodium: 137 mmol/L (ref 135–146)
TOTAL PROTEIN: 7.6 g/dL (ref 6.1–8.1)

## 2016-12-05 LAB — MICROALBUMIN / CREATININE URINE RATIO
Creatinine, Urine: 89 mg/dL (ref 20–275)
MICROALB/CREAT RATIO: 139 ug/mg{creat} — AB (ref <30)
Microalb, Ur: 12.4 mg/dL

## 2016-12-05 LAB — LIPID PANEL
CHOLESTEROL: 214 mg/dL — AB (ref <200)
HDL: 61 mg/dL (ref >50)
LDL Cholesterol (Calc): 121 mg/dL (calc) — ABNORMAL HIGH
Non-HDL Cholesterol (Calc): 153 mg/dL (calc) — ABNORMAL HIGH (ref <130)
Total CHOL/HDL Ratio: 3.5 (calc) (ref <5.0)
Triglycerides: 197 mg/dL — ABNORMAL HIGH (ref <150)

## 2016-12-05 MED ORDER — ONETOUCH DELICA LANCETS 33G MISC
5 refills | Status: DC
Start: 2016-12-05 — End: 2018-10-01

## 2016-12-05 MED ORDER — GLUCOSE BLOOD VI STRP: ORAL_STRIP | 5 refills | Status: DC

## 2017-01-01 ENCOUNTER — Other Ambulatory Visit: Payer: Self-pay | Admitting: Family Medicine

## 2017-01-01 NOTE — Telephone Encounter (Signed)
Pt has 4 days left and will wait until her appt Friday to get a refill

## 2017-01-01 NOTE — Telephone Encounter (Signed)
Pt has an appt on 11/9 and called to find out if she had enough to get to her appt or needed a refill on this

## 2017-01-01 NOTE — Telephone Encounter (Signed)
Pt has an appt 11/9

## 2017-01-03 ENCOUNTER — Ambulatory Visit: Payer: 59 | Admitting: Family Medicine

## 2017-01-03 ENCOUNTER — Encounter: Payer: Self-pay | Admitting: Family Medicine

## 2017-01-03 VITALS — BP 120/80 | HR 88 | Wt 248.2 lb

## 2017-01-03 DIAGNOSIS — E785 Hyperlipidemia, unspecified: Secondary | ICD-10-CM | POA: Diagnosis not present

## 2017-01-03 DIAGNOSIS — E1139 Type 2 diabetes mellitus with other diabetic ophthalmic complication: Secondary | ICD-10-CM | POA: Diagnosis not present

## 2017-01-03 DIAGNOSIS — I10 Essential (primary) hypertension: Secondary | ICD-10-CM | POA: Diagnosis not present

## 2017-01-03 DIAGNOSIS — E1169 Type 2 diabetes mellitus with other specified complication: Secondary | ICD-10-CM

## 2017-01-03 DIAGNOSIS — IMO0002 Reserved for concepts with insufficient information to code with codable children: Secondary | ICD-10-CM

## 2017-01-03 DIAGNOSIS — E1165 Type 2 diabetes mellitus with hyperglycemia: Secondary | ICD-10-CM

## 2017-01-03 NOTE — Progress Notes (Signed)
   Subjective:    Patient ID: Victoria Padilla, female    DOB: [birth year], 51 y.o.   MRN: [id number]  HPI Chief Complaint  Patient presents with  . follow-up    follow-up sugars. still running somewhat high. lowest 120- 256.    She is here to follow up on uncontrolled diabetes and HTN.  Prior to our visit 4 weeks ago she had stopped her medications for no particular reason. Since then she reports starting back on all of her medications and we added Farxiga to her diabetes regimen. She is doing well and no concerns today.  FBS 120 this morning. Overall she has seen a trend of improved readings since starting back on her meds.  She reports walking more and eating healthy.   Denies fever, chills, dizziness, chest pain, palpitations, shortness of breath, abdominal pain, N/V/D, urinary symptoms, LE edema.   Needs diabetic eye exam.   Reviewed allergies, medications, past medical, surgical, and social history.   Review of Systems Pertinent positives and negatives in the history of present illness.     Objective:   Physical Exam BP 120/80   Pulse 88   Wt 248 lb 3.2 oz (112.6 kg)   BMI 43.97 kg/m   Alert and oriented and in no acute distress. Not otherwise examined.      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 She has lost weight and is feeling good overall. Her blood sugars are trending down and the addition of Wilder Glade seems to be helping. She will continue to keep an eye on her daily BS. Discussed that her BP is at goal. She has been taking a statin again for the past 3 weeks. We will get her back for a lab visit to recheck lipids in 4-6 weeks and bring her back for a diabetes visit in 2 months. She is overdue for her annual wellness visit and  we can do this at the same time if she would like to schedule it this way.  Encouraged her to schedule a diabetic eye exam.

## 2017-01-07 ENCOUNTER — Telehealth: Payer: Self-pay | Admitting: Family Medicine

## 2017-01-07 MED ORDER — DAPAGLIFLOZIN PROPANEDIOL 5 MG PO TABS: 5.0000 mg | ORAL_TABLET | Freq: Every day | ORAL | 2 refills | Status: DC

## 2017-01-07 NOTE — Telephone Encounter (Signed)
Pt at pharmacy needs refill Wilder Glade, ok per [facility] #30/2.  Called into [facility].

## 2017-01-29 ENCOUNTER — Telehealth: Payer: Self-pay | Admitting: Family Medicine

## 2017-01-29 DIAGNOSIS — I10 Essential (primary) hypertension: Secondary | ICD-10-CM

## 2017-01-29 MED ORDER — METFORMIN HCL 1000 MG PO TABS: 1000.0000 mg | ORAL_TABLET | Freq: Two times a day (BID) | ORAL | 0 refills | Status: DC

## 2017-01-29 MED ORDER — BENAZEPRIL HCL 20 MG PO TABS: 20.0000 mg | ORAL_TABLET | Freq: Every day | ORAL | 0 refills | Status: DC

## 2017-01-29 NOTE — Telephone Encounter (Signed)
Rcvd 90 day refill request for Metformin 1,000 mg & Benazepril 20 mg

## 2017-01-29 NOTE — Telephone Encounter (Signed)
Done

## 2017-02-11 ENCOUNTER — Other Ambulatory Visit: Payer: Self-pay

## 2017-02-11 ENCOUNTER — Telehealth: Payer: Self-pay | Admitting: Family Medicine

## 2017-02-11 MED ORDER — METFORMIN HCL 1000 MG PO TABS: 1000.0000 mg | ORAL_TABLET | Freq: Two times a day (BID) | ORAL | 0 refills | Status: DC

## 2017-02-11 NOTE — Telephone Encounter (Signed)
Fax [facility]ill request from [facility]  Metformin hcl 1,000mg  #60  Requesting rx 90 day supply

## 2017-02-11 NOTE — Telephone Encounter (Signed)
Submitted

## 2017-02-25 HISTORY — PX: BREAST BIOPSY: SHX20

## 2017-03-03 ENCOUNTER — Telehealth: Payer: Self-pay | Admitting: Family Medicine

## 2017-03-03 DIAGNOSIS — I10 Essential (primary) hypertension: Secondary | ICD-10-CM

## 2017-03-03 MED ORDER — BENAZEPRIL HCL 20 MG PO TABS: 20.0000 mg | ORAL_TABLET | Freq: Every day | ORAL | 0 refills | Status: DC

## 2017-03-03 MED ORDER — GLIPIZIDE 10 MG PO TABS: ORAL_TABLET | ORAL | 0 refills | Status: DC

## 2017-03-03 MED ORDER — SIMVASTATIN 20 MG PO TABS: 20.0000 mg | ORAL_TABLET | Freq: Every day | ORAL | 0 refills | Status: DC

## 2017-03-03 NOTE — Telephone Encounter (Signed)
Done

## 2017-03-03 NOTE — Telephone Encounter (Signed)
Rcvd refill request for Simvastatin 20 mg #90 °

## 2017-03-03 NOTE — Telephone Encounter (Signed)
Rcvd refill request for Glipizide 10 mg #90 & Benazepril 20 mg #90

## 2017-03-06 NOTE — Progress Notes (Deleted)
  Subjective:    Patient ID: Victoria Padilla, female    DOB: 19-Dec-1965, 52 y.o.   MRN: [id number]  Victoria Padilla is a 52 y.o. female who presents for follow-up of Type 2 diabetes mellitus.  Patient {is/are not:32546} checking home blood sugars.   Home blood sugar records: {dm home sugars:14018} How often is blood sugars being checked: *** Current symptoms include: {dm sx:14075}. Patient denies {dm sx:19199}.  Patient {is/are not:32546} checking their feet daily. Any Foot concerns (callous, ulcer, wound, thickened nails, toenail fungus, skin fungus, hammer toe): *** Last dilated eye exam: ***  Current treatments: {dm interventions:14074}. Medication compliance: {good/fair/poor:33178}  Current diet: {diet habits:16563} Current exercise: {exercise types:16438} Known diabetic complications: {diabetes complications:1215}  The following portions of the patient's history were reviewed and updated as appropriate: allergies, current medications, past medical history, past social history and problem list.  ROS as in subjective above.     Objective:    Physical Exam Alert and in no distress otherwise not examined.  There were no vitals taken for this visit.  Lab Review Diabetic Labs Latest Ref Rng & Units 12/04/2016 03/20/2016 01/23/2016 12/21/2015 12/14/2015  HbA1c - 10.3% 8.4% - - 9.1%  Microalbumin mg/dL 12.4 - - 1.3 -  Micro/Creat Ratio <30 mcg/mg creat 139(H) - - 15 -  Chol <200 mg/dL 214(H) - 127 - -  HDL >50 mg/dL 61 - 42(L) - -  Calc LDL <100 mg/dL - - 58 - -  Triglycerides <150 mg/dL 197(H) - 137 - -  Creatinine 0.50 - 1.05 mg/dL 0.66 - - - 0.75   BP/Weight 01/03/2017 12/04/2016 11/27/2016 09/04/2016 03/15/4172  Systolic BP 081 448 185 631 497  Diastolic BP 80 90 026 90 76  Wt. (Lbs) 248.2 253.4 250 - 241  BMI 43.97 44.89 44.29 - 42.69   Foot/eye exam completion dates Latest Ref Rng & Units 12/04/2016 09/26/2015  Eye Exam No Retinopathy - Retinopathy(A)  Foot Form  Completion - Done -    Lafern  reports that  has never smoked. she has never used smokeless tobacco. She reports that she does not drink alcohol or use drugs.     Assessment & Plan:    Uncontrolled type 2 diabetes mellitus with complication (HCC)  Hyperlipidemia associated with type 2 diabetes mellitus ([place])  Essential hypertension  Morbid obesity ([place])  1. Rx changes: {none:33079} 2. Education: Reviewed 'ABCs' of diabetes management (respective goals in parentheses):  A1C (<7), blood pressure (<130/80), and cholesterol (LDL <100). 3. Compliance at present is estimated to be {good/fair/poor:33178}. Efforts to improve compliance (if necessary) will be directed at {compliance:16716}. 4. Follow up: {NUMBERS; 0-10:33138} {time:11}

## 2017-03-07 ENCOUNTER — Ambulatory Visit: Payer: 59 | Admitting: Family Medicine

## 2017-03-10 ENCOUNTER — Telehealth: Payer: Self-pay | Admitting: Family Medicine

## 2017-03-10 ENCOUNTER — Encounter: Payer: Self-pay | Admitting: Family Medicine

## 2017-03-10 MED ORDER — DAPAGLIFLOZIN PROPANEDIOL 5 MG PO TABS: 5.0000 mg | ORAL_TABLET | Freq: Every day | ORAL | 2 refills | Status: DC

## 2017-03-10 NOTE — Telephone Encounter (Signed)
[facility] Farxiga 5 [facility] tab #30

## 2017-03-12 NOTE — Telephone Encounter (Signed)
dt ?

## 2017-04-01 ENCOUNTER — Ambulatory Visit: Payer: 59 | Admitting: Family Medicine

## 2017-04-01 ENCOUNTER — Encounter: Payer: Self-pay | Admitting: Family Medicine

## 2017-04-01 VITALS — BP 182/100 | HR 90 | Wt 254.2 lb

## 2017-04-01 DIAGNOSIS — M461 Sacroiliitis, not elsewhere classified: Secondary | ICD-10-CM

## 2017-04-01 DIAGNOSIS — I10 Essential (primary) hypertension: Secondary | ICD-10-CM | POA: Diagnosis not present

## 2017-04-01 DIAGNOSIS — R002 Palpitations: Secondary | ICD-10-CM

## 2017-04-01 DIAGNOSIS — E1159 Type 2 diabetes mellitus with other circulatory complications: Secondary | ICD-10-CM

## 2017-04-01 DIAGNOSIS — I152 Hypertension secondary to endocrine disorders: Secondary | ICD-10-CM

## 2017-04-01 MED ORDER — BENAZEPRIL HCL 40 MG PO TABS
40.0000 mg | ORAL_TABLET | Freq: Every day | ORAL | 1 refills | Status: DC
Start: 2017-04-01 — End: 2017-07-31

## 2017-04-01 NOTE — Progress Notes (Signed)
   Subjective:    Patient ID: Victoria Padilla, female    DOB: August 24, 1965, 52 y.o.   MRN: [id number]  HPI She is here for evaluation of a long history of intermittent difficulty with a fluttering sensation in her heart.  Apparently she was seen in an emergency room in the past and EKG plus blood work was normal.  She have this several days ago but does not know how to check her pulse had no chest pain, shortness of breath, PND, weakness or diaphoresis. She also complains of left-sided low back pain is made worse with coughing and motion.  No numbness, tingling or weakness.  No history of injury or overuse.  Review of Systems     Objective:   Physical Exam Alert and in no distress.  Blood pressure is recorded.  Cardiac exam shows regular rhythm without murmurs or gallops.  Lungs are clear to auscultation.  Back exam shows accentuated lumbar lordosis with tenderness over left SI joint.  Full motion of the hips.  Corky Sox testing was positive.  Negative straight leg raising.  DTRs normal.      Assessment & Plan:  Sacroiliitis (HCC)  Fluttering sensation of heart  Hypertension associated with diabetes ([place]) - Plan: benazepril (LOTENSIN) 40 MG tablet  Discussed treatment of the sacroiliitis with heat for 20 minutes 3 times per day, gentle stretching and anti-inflammatory of choice. I then discussed the fluttering sensation and instructed her on how to check her pulse.  Recommend she keep track of the rate as well as whether it is regular or irregular and let us know. Her blood pressure is not under good control and I will increase her Lotensin.  Recheck here in 1 month.

## 2017-04-01 NOTE — Patient Instructions (Signed)
The next time he had the fluttering when she did check your pulse and let us know how fast it is and whether it is regular or irregular.  Also need to know if there are any other symptoms associated with Heat for 20 minutes 3 times per day.  Gentle stretching after that.  He can take as many as 4 Advil 3 times per day for the pain

## 2017-04-04 ENCOUNTER — Other Ambulatory Visit: Payer: Self-pay | Admitting: Family Medicine

## 2017-04-23 ENCOUNTER — Encounter: Payer: Self-pay | Admitting: Women's Health

## 2017-04-23 ENCOUNTER — Ambulatory Visit (INDEPENDENT_AMBULATORY_CARE_PROVIDER_SITE_OTHER): Payer: 59 | Admitting: Women's Health

## 2017-04-23 ENCOUNTER — Other Ambulatory Visit: Payer: Self-pay | Admitting: Family Medicine

## 2017-04-23 VITALS — BP 150/80 | Ht 63.0 in | Wt 259.0 lb

## 2017-04-23 DIAGNOSIS — Z1231 Encounter for screening mammogram for malignant neoplasm of breast: Secondary | ICD-10-CM

## 2017-04-23 DIAGNOSIS — Z01419 Encounter for gynecological examination (general) (routine) without abnormal findings: Secondary | ICD-10-CM | POA: Diagnosis not present

## 2017-04-23 LAB — RESULTS CONSOLE HPV: CHL HPV: NEGATIVE

## 2017-04-23 NOTE — Addendum Note (Signed)
Addended by: Janalyn Harder A on: 04/23/2017 02:00 PM   Modules accepted: Orders

## 2017-04-23 NOTE — Progress Notes (Signed)
Victoria Padilla 09-Oct-1965 [id number]    History:    Presents for annual exam.  Continues to have one to 2 cycles per year, has not gone a full year with no menses, LMP October 2018 ,occasional hot flushes. History of BTL. Not sexually active greater than 10 years. 1998 cryo-with normal Paps after overdue for Pap. Normal mammogram history. Negative colonoscopy 03/2016. Primary care manages labs and meds and monitors hypertension, diabetes, hypercholesterolemia.  Past medical history, past surgical history, family history and social history were all reviewed and documented in the EPIC chart. Works for [employer]. Has a Masters in early education. Parents hypertension and heart disease. 3 children all doing well.  ROS:  A ROS was performed and pertinent positives and negatives are included.  Exam:  Vitals:   04/23/17 1154  BP: (!) 150/80  Weight: 259 lb (117.5 kg)  Height: 5\' 3"  (1.6 m)   Body mass index is 45.88 kg/m.   General appearance:  Normal Thyroid:  Symmetrical, normal in size, without palpable masses or nodularity. Respiratory  Auscultation:  Clear without wheezing or rhonchi Cardiovascular  Auscultation:  Regular rate, without rubs, murmurs or gallops  Edema/varicosities:  Not grossly evident Abdominal  Soft,nontender, without masses, guarding or rebound.  Liver/spleen:  No organomegaly noted  Hernia:  None appreciated  Skin  Inspection:  Grossly normal   Breasts: Examined lying and sitting.     Right: Without masses, retractions, discharge or axillary adenopathy.     Left: Without masses, retractions, discharge or axillary adenopathy. Gentitourinary   Inguinal/mons:  Normal without inguinal adenopathy  External genitalia:  Normal  BUS/Urethra/Skene's glands:  Normal  Vagina:  Normal  Cervix:  Normal  Uterus:  normal in size, shape and contour.  Midline and mobile  Adnexa/parametria:     Rt: Without masses or tenderness.   Lt: Without masses or  tenderness.  Anus and perineum: Normal  Digital rectal exam: Normal sphincter tone without palpated masses or tenderness  Assessment/Plan:  52 y.o. DBF G3 P3 for annual exam with no GYN complaints.  Perimenopausal/BTL 1998 cryo-with normal Paps after Hypertension, diabetes, hypercholesterolemia - primary care manages labs and meds Obesity  Plan: Reviewed importance of increasing exercise, weightbearing and balanced salt exercise encouraged, decreasing calories/carbs and processed foods. SBE's, annual screening mammogram overdue, breast center information given instructed to schedule. Home safety, fall prevention reviewed. Vitamin D 2000 daily encouraged. Instructed to call if any further bleeding. Pap with HR HPV typing, new screening guidelines reviewed. Reviewed blood pressure elevated today, follow-up with primary care.    [place], 12:47 PM 04/23/2017

## 2017-04-23 NOTE — Patient Instructions (Signed)
[facility] [phone]   Carbohydrate Counting for Diabetes Mellitus, Adult Carbohydrate counting is a method for keeping track of how many carbohydrates you eat. Eating carbohydrates naturally increases the amount of sugar (glucose) in the blood. Counting how many carbohydrates you eat helps keep your blood glucose within normal limits, which helps you manage your diabetes (diabetes mellitus). It is important to know how many carbohydrates you can safely have in each meal. This is different for every person. A diet and nutrition specialist (registered dietitian) can help you make a meal plan and calculate how many carbohydrates you should have at each meal and snack. Carbohydrates are found in the following foods:  Grains, such as breads and cereals.  Dried beans and soy products.  Starchy vegetables, such as potatoes, peas, and corn.  Fruit and fruit juices.  Milk and yogurt.  Sweets and snack foods, such as cake, cookies, candy, chips, and soft drinks.  How do I count carbohydrates? There are two ways to count carbohydrates in food. You can use either of the methods or a combination of both. Reading "Nutrition Facts" on packaged food The "Nutrition Facts" list is included on the labels of almost all packaged foods and beverages in the [place] It includes:  The serving size.  Information about nutrients in each serving, including the grams (g) of carbohydrate per serving.  To use the "Nutrition Facts":  Decide how many servings you will have.  Multiply the number of servings by the number of carbohydrates per serving.  The resulting number is the total amount of carbohydrates that you will be having.  Learning standard serving sizes of other foods When you eat foods containing carbohydrates that are not packaged or do not include "Nutrition Facts" on the label, you need to measure the servings in order to count the amount of carbohydrates:  Measure the foods that you will eat  with a food scale or measuring cup, if needed.  Decide how many standard-size servings you will eat.  Multiply the number of servings by 15. Most carbohydrate-rich foods have about 15 g of carbohydrates per serving. ? For example, if you eat 8 oz (170 g) of strawberries, you will have eaten 2 servings and 30 g of carbohydrates (2 servings x 15 g = 30 g).  For foods that have more than one food mixed, such as soups and casseroles, you must count the carbohydrates in each food that is included.  The following list contains standard serving sizes of common carbohydrate-rich foods. Each of these servings has about 15 g of carbohydrates:   hamburger bun or  English muffin.   oz (15 mL) syrup.   oz (14 g) jelly.  1 slice of bread.  1 six-inch tortilla.  3 oz (85 g) cooked rice or pasta.  4 oz (113 g) cooked dried beans.  4 oz (113 g) starchy vegetable, such as peas, corn, or potatoes.  4 oz (113 g) hot cereal.  4 oz (113 g) mashed potatoes or  of a large baked potato.  4 oz (113 g) canned or frozen fruit.  4 oz (120 mL) fruit juice.  4-6 crackers.  6 chicken nuggets.  6 oz (170 g) unsweetened dry cereal.  6 oz (170 g) plain fat-free yogurt or yogurt sweetened with artificial sweeteners.  8 oz (240 mL) milk.  8 oz (170 g) fresh fruit or one small piece of fruit.  24 oz (680 g) popped popcorn.  Example of carbohydrate counting Sample meal  3  oz (85 g) chicken breast.  6 oz (170 g) brown rice.  4 oz (113 g) corn.  8 oz (240 mL) milk.  8 oz (170 g) strawberries with sugar-free whipped topping. Carbohydrate calculation 1. Identify the foods that contain carbohydrates: ? Rice. ? Corn. ? Milk. ? Strawberries. 2. Calculate how many servings you have of each food: ? 2 servings rice. ? 1 serving corn. ? 1 serving milk. ? 1 serving strawberries. 3. Multiply each number of servings by 15 g: ? 2 servings rice x 15 g = 30 g. ? 1 serving corn x 15 g = 15  g. ? 1 serving milk x 15 g = 15 g. ? 1 serving strawberries x 15 g = 15 g. 4. Add together all of the amounts to find the total grams of carbohydrates eaten: ? 30 g + 15 g + 15 g + 15 g = 75 g of carbohydrates total. This information is not intended to replace advice given to you by your health care provider. Make sure you discuss any questions you have with your health care provider. Document Released: 02/11/2005 Document Revised: 09/01/2015 Document Reviewed: 07/26/2015 Elsevier Interactive Patient Education  2018 [employer] Eating Plan DASH stands for "Dietary Approaches to Stop Hypertension." The DASH eating plan is a healthy eating plan that has been shown to reduce high blood pressure (hypertension). It may also reduce your risk for type 2 diabetes, heart disease, and stroke. The DASH eating plan may also help with weight loss. What are tips for following this plan? General guidelines  Avoid eating more than 2,300 mg (milligrams) of salt (sodium) a day. If you have hypertension, you may need to reduce your sodium intake to 1,500 mg a day.  Limit alcohol intake to no more than 1 drink a day for nonpregnant women and 2 drinks a day for men. One drink equals 12 oz of beer, 5 oz of wine, or 1 oz of hard liquor.  Work with your health care provider to maintain a healthy body weight or to lose weight. Ask what an ideal weight is for you.  Get at least 30 minutes of exercise that causes your heart to beat faster (aerobic exercise) most days of the week. Activities may include walking, swimming, or biking.  Work with your health care provider or diet and nutrition specialist (dietitian) to adjust your eating plan to your individual calorie needs. Reading food labels  Check food labels for the amount of sodium per serving. Choose foods with less than 5 percent of the Daily Value of sodium. Generally, foods with less than 300 mg of sodium per serving fit into this eating plan.  To  find whole grains, look for the word "whole" as the first word in the ingredient list. Shopping  Buy products labeled as "low-sodium" or "no salt added."  Buy fresh foods. Avoid canned foods and premade or frozen meals. Cooking  Avoid adding salt when cooking. Use salt-free seasonings or herbs instead of table salt or sea salt. Check with your health care provider or pharmacist before using salt substitutes.  Do not fry foods. Cook foods using healthy methods such as baking, boiling, grilling, and broiling instead.  Cook with heart-healthy oils, such as olive, canola, soybean, or sunflower oil. Meal planning   Eat a balanced diet that includes: ? 5 or more servings of fruits and vegetables each day. At each meal, try to fill half of your plate with fruits and vegetables. ? Up to  6-8 servings of whole grains each day. ? Less than 6 oz of lean meat, poultry, or fish each day. A 3-oz serving of meat is about the same size as a deck of cards. One egg equals 1 oz. ? 2 servings of low-fat dairy each day. ? A serving of nuts, seeds, or beans 5 times each week. ? Heart-healthy fats. Healthy fats called Omega-3 fatty acids are found in foods such as flaxseeds and coldwater fish, like sardines, salmon, and mackerel.  Limit how much you eat of the following: ? Canned or prepackaged foods. ? Food that is high in trans fat, such as fried foods. ? Food that is high in saturated fat, such as fatty meat. ? Sweets, desserts, sugary drinks, and other foods with added sugar. ? Full-fat dairy products.  Do not salt foods before eating.  Try to eat at least 2 vegetarian meals each week.  Eat more home-cooked food and less restaurant, buffet, and fast food.  When eating at a restaurant, ask that your food be prepared with less salt or no salt, if possible. What foods are recommended? The items listed may not be a complete list. Talk with your dietitian about what dietary choices are best for  you. Grains Whole-grain or whole-wheat bread. Whole-grain or whole-wheat pasta. Brown rice. [place]. Bulgur. Whole-grain and low-sodium cereals. Pita bread. Low-fat, low-sodium crackers. Whole-wheat flour tortillas. Vegetables Fresh or frozen vegetables (raw, steamed, roasted, or grilled). Low-sodium or reduced-sodium tomato and vegetable juice. Low-sodium or reduced-sodium tomato sauce and tomato paste. Low-sodium or reduced-sodium canned vegetables. Fruits All fresh, dried, or frozen fruit. Canned fruit in natural juice (without added sugar). Meat and other protein foods Skinless chicken or [place]. Ground chicken or [place]. Pork with fat trimmed off. Fish and seafood. Egg whites. Dried beans, peas, or lentils. Unsalted nuts, nut butters, and seeds. Unsalted canned beans. Lean cuts of beef with fat trimmed off. Low-sodium, lean deli meat. Dairy Low-fat (1%) or fat-free (skim) milk. Fat-free, low-fat, or reduced-fat cheeses. Nonfat, low-sodium ricotta or cottage cheese. Low-fat or nonfat yogurt. Low-fat, low-sodium cheese. Fats and oils Soft margarine without trans fats. Vegetable oil. Low-fat, reduced-fat, or light mayonnaise and salad dressings (reduced-sodium). Canola, safflower, olive, soybean, and sunflower oils. Avocado. Seasoning and other foods Herbs. Spices. Seasoning mixes without salt. Unsalted popcorn and pretzels. Fat-free sweets. What foods are not recommended? The items listed may not be a complete list. Talk with your dietitian about what dietary choices are best for you. Grains Baked goods made with fat, such as croissants, muffins, or some breads. Dry pasta or rice meal packs. Vegetables Creamed or fried vegetables. Vegetables in a cheese sauce. Regular canned vegetables (not low-sodium or reduced-sodium). Regular canned tomato sauce and paste (not low-sodium or reduced-sodium). Regular tomato and vegetable juice (not low-sodium or reduced-sodium). Angie Fava.  Olives. Fruits Canned fruit in a light or heavy syrup. Fried fruit. Fruit in cream or butter sauce. Meat and other protein foods Fatty cuts of meat. Ribs. Fried meat. Berniece Salines. Sausage. Bologna and other processed lunch meats. Salami. Fatback. Hotdogs. Bratwurst. Salted nuts and seeds. Canned beans with added salt. Canned or smoked fish. Whole eggs or egg yolks. Chicken or [place] with skin. Dairy Whole or 2% milk, cream, and half-and-half. Whole or full-fat cream cheese. Whole-fat or sweetened yogurt. Full-fat cheese. Nondairy creamers. Whipped toppings. Processed cheese and cheese spreads. Fats and oils Butter. Stick margarine. Lard. Shortening. Ghee. Bacon fat. Tropical oils, such as coconut, palm kernel, or palm oil. Seasoning and  other foods Salted popcorn and pretzels. Onion salt, garlic salt, seasoned salt, table salt, and sea salt. Worcestershire sauce. Tartar sauce. Barbecue sauce. Teriyaki sauce. Soy sauce, including reduced-sodium. Steak sauce. Canned and packaged gravies. Fish sauce. Oyster sauce. Cocktail sauce. Horseradish that you find on the shelf. Ketchup. Mustard. Meat flavorings and tenderizers. Bouillon cubes. Hot sauce and Tabasco sauce. Premade or packaged marinades. Premade or packaged taco seasonings. Relishes. Regular salad dressings. Where to find more information:  National Heart, Lung, and [place]: https://wilson-eaton.com/  American Heart Association: www.heart.org Summary  The DASH eating plan is a healthy eating plan that has been shown to reduce high blood pressure (hypertension). It may also reduce your risk for type 2 diabetes, heart disease, and stroke.  With the DASH eating plan, you should limit salt (sodium) intake to 2,300 mg a day. If you have hypertension, you may need to reduce your sodium intake to 1,500 mg a day.  When on the DASH eating plan, aim to eat more fresh fruits and vegetables, whole grains, lean proteins, low-fat dairy, and heart-healthy  fats.  Work with your health care provider or diet and nutrition specialist (dietitian) to adjust your eating plan to your individual calorie needs. This information is not intended to replace advice given to you by your health care provider. Make sure you discuss any questions you have with your health care provider. Document Released: 01/31/2011 Document Revised: 02/05/2016 Document Reviewed: 02/05/2016 Elsevier Interactive Patient Education  2018 [employer] Maintenance, Female Adopting a healthy lifestyle and getting preventive care can go a long way to promote health and wellness. Talk with your health care provider about what schedule of regular examinations is right for you. This is a good chance for you to check in with your provider about disease prevention and staying healthy. In between checkups, there are plenty of things you can do on your own. Experts have done a lot of research about which lifestyle changes and preventive measures are most likely to keep you healthy. Ask your health care provider for more information. Weight and diet Eat a healthy diet  Be sure to include plenty of vegetables, fruits, low-fat dairy products, and lean protein.  Do not eat a lot of foods high in solid fats, added sugars, or salt.  Get regular exercise. This is one of the most important things you can do for your health. ? Most adults should exercise for at least 150 minutes each week. The exercise should increase your heart rate and make you sweat (moderate-intensity exercise). ? Most adults should also do strengthening exercises at least twice a week. This is in addition to the moderate-intensity exercise.  Maintain a healthy weight  Body mass index (BMI) is a measurement that can be used to identify possible weight problems. It estimates body fat based on height and weight. Your health care provider can help determine your BMI and help you achieve or maintain a healthy weight.  For  females 54 years of age and older: ? A BMI below 18.5 is considered underweight. ? A BMI of 18.5 to 24.9 is normal. ? A BMI of 25 to 29.9 is considered overweight. ? A BMI of 30 and above is considered obese.  Watch levels of cholesterol and blood lipids  You should start having your blood tested for lipids and cholesterol at 54 years of age, then have this test every 5 years.  You may need to have your cholesterol levels checked more often if: ? Your lipid  or cholesterol levels are high. ? You are older than 54 years of age. ? You are at high risk for heart disease.  Cancer screening Lung Cancer  Lung cancer screening is recommended for adults 63-68 years old who are at high risk for lung cancer because of a history of smoking.  A yearly low-dose CT scan of the lungs is recommended for people who: ? Currently smoke. ? Have quit within the past 15 years. ? Have at least a 30-pack-year history of smoking. A pack year is smoking an average of one pack of cigarettes a day for 1 year.  Yearly screening should continue until it has been 15 years since you quit.  Yearly screening should stop if you develop a health problem that would prevent you from having lung cancer treatment.  Breast Cancer  Practice breast self-awareness. This means understanding how your breasts normally appear and feel.  It also means doing regular breast self-exams. Let your health care provider know about any changes, no matter how small.  If you are in your 20s or 30s, you should have a clinical breast exam (CBE) by a health care provider every 1-3 years as part of a regular health exam.  If you are 58 or older, have a CBE every year. Also consider having a breast X-ray (mammogram) every year.  If you have a family history of breast cancer, talk to your health care provider about genetic screening.  If you are at high risk for breast cancer, talk to your health care provider about having an MRI and a  mammogram every year.  Breast cancer gene (BRCA) assessment is recommended for women who have family members with BRCA-related cancers. BRCA-related cancers include: ? Breast. ? Ovarian. ? Tubal. ? Peritoneal cancers.  Results of the assessment will determine the need for genetic counseling and BRCA1 and BRCA2 testing.  Cervical Cancer Your health care provider may recommend that you be screened regularly for cancer of the pelvic organs (ovaries, uterus, and vagina). This screening involves a pelvic examination, including checking for microscopic changes to the surface of your cervix (Pap test). You may be encouraged to have this screening done every 3 years, beginning at age 54.  For women ages 79-65, health care providers may recommend pelvic exams and Pap testing every 3 years, or they may recommend the Pap and pelvic exam, combined with testing for human papilloma virus (HPV), every 5 years. Some types of HPV increase your risk of cervical cancer. Testing for HPV may also be done on women of any age with unclear Pap test results.  Other health care providers may not recommend any screening for nonpregnant women who are considered low risk for pelvic cancer and who do not have symptoms. Ask your health care provider if a screening pelvic exam is right for you.  If you have had past treatment for cervical cancer or a condition that could lead to cancer, you need Pap tests and screening for cancer for at least 20 years after your treatment. If Pap tests have been discontinued, your risk factors (such as having a new sexual partner) need to be reassessed to determine if screening should resume. Some women have medical problems that increase the chance of getting cervical cancer. In these cases, your health care provider may recommend more frequent screening and Pap tests.  Colorectal Cancer  This type of cancer can be detected and often prevented.  Routine colorectal cancer screening usually  begins at 54 years of age  and continues through 54 years of age.  Your health care provider may recommend screening at an earlier age if you have risk factors for colon cancer.  Your health care provider may also recommend using home test kits to check for hidden blood in the stool.  A small camera at the end of a tube can be used to examine your colon directly (sigmoidoscopy or colonoscopy). This is done to check for the earliest forms of colorectal cancer.  Routine screening usually begins at age 73.  Direct examination of the colon should be repeated every 5-10 years through 54 years of age. However, you may need to be screened more often if early forms of precancerous polyps or small growths are found.  Skin Cancer  Check your skin from head to toe regularly.  Tell your health care provider about any new moles or changes in moles, especially if there is a change in a mole's shape or color.  Also tell your health care provider if you have a mole that is larger than the size of a pencil eraser.  Always use sunscreen. Apply sunscreen liberally and repeatedly throughout the day.  Protect yourself by wearing long sleeves, pants, a wide-brimmed hat, and sunglasses whenever you are outside.  Heart disease, diabetes, and high blood pressure  High blood pressure causes heart disease and increases the risk of stroke. High blood pressure is more likely to develop in: ? People who have blood pressure in the high end of the normal range (130-139/85-89 mm Hg). ? People who are overweight or obese. ? People who are African American.  If you are 52-20 years of age, have your blood pressure checked every 3-5 years. If you are 45 years of age or older, have your blood pressure checked every year. You should have your blood pressure measured twice-once when you are at a hospital or clinic, and once when you are not at a hospital or clinic. Record the average of the two measurements. To check your  blood pressure when you are not at a hospital or clinic, you can use: ? An automated blood pressure machine at a pharmacy. ? A home blood pressure monitor.  If you are between 62 years and 17 years old, ask your health care provider if you should take aspirin to prevent strokes.  Have regular diabetes screenings. This involves taking a blood sample to check your fasting blood sugar level. ? If you are at a normal weight and have a low risk for diabetes, have this test once every three years after 54 years of age. ? If you are overweight and have a high risk for diabetes, consider being tested at a younger age or more often. Preventing infection Hepatitis B  If you have a higher risk for hepatitis B, you should be screened for this virus. You are considered at high risk for hepatitis B if: ? You were born in a country where hepatitis B is common. Ask your health care provider which countries are considered high risk. ? Your parents were born in a high-risk country, and you have not been immunized against hepatitis B (hepatitis B vaccine). ? You have HIV or AIDS. ? You use needles to inject street drugs. ? You live with someone who has hepatitis B. ? You have had sex with someone who has hepatitis B. ? You get hemodialysis treatment. ? You take certain medicines for conditions, including cancer, organ transplantation, and autoimmune conditions.  Hepatitis C  Blood testing is recommended  for: ? Everyone born from [birth year] through 1967. ? Anyone with known risk factors for hepatitis C.  Sexually transmitted infections (STIs)  You should be screened for sexually transmitted infections (STIs) including gonorrhea and chlamydia if: ? You are sexually active and are younger than 54 years of age. ? You are older than 54 years of age and your health care provider tells you that you are at risk for this type of infection. ? Your sexual activity has changed since you were last screened and you are at  an increased risk for chlamydia or gonorrhea. Ask your health care provider if you are at risk.  If you do not have HIV, but are at risk, it may be recommended that you take a prescription medicine daily to prevent HIV infection. This is called pre-exposure prophylaxis (PrEP). You are considered at risk if: ? You are sexually active and do not regularly use condoms or know the HIV status of your partner(s). ? You take drugs by injection. ? You are sexually active with a partner who has HIV.  Talk with your health care provider about whether you are at high risk of being infected with HIV. If you choose to begin PrEP, you should first be tested for HIV. You should then be tested every 3 months for as long as you are taking PrEP. Pregnancy  If you are premenopausal and you may become pregnant, ask your health care provider about preconception counseling.  If you may become pregnant, take 400 to 800 micrograms (mcg) of folic acid every day.  If you want to prevent pregnancy, talk to your health care provider about birth control (contraception). Osteoporosis and menopause  Osteoporosis is a disease in which the bones lose minerals and strength with aging. This can result in serious bone fractures. Your risk for osteoporosis can be identified using a bone density scan.  If you are 91 years of age or older, or if you are at risk for osteoporosis and fractures, ask your health care provider if you should be screened.  Ask your health care provider whether you should take a calcium or vitamin D supplement to lower your risk for osteoporosis.  Menopause may have certain physical symptoms and risks.  Hormone replacement therapy may reduce some of these symptoms and risks. Talk to your health care provider about whether hormone replacement therapy is right for you. Follow these instructions at home:  Schedule regular health, dental, and eye exams.  Stay current with your immunizations.  Do not  use any tobacco products including cigarettes, chewing tobacco, or electronic cigarettes.  If you are pregnant, do not drink alcohol.  If you are breastfeeding, limit how much and how often you drink alcohol.  Limit alcohol intake to no more than 1 drink per day for nonpregnant women. One drink equals 12 ounces of beer, 5 ounces of wine, or 1 ounces of hard liquor.  Do not use street drugs.  Do not share needles.  Ask your health care provider for help if you need support or information about quitting drugs.  Tell your health care provider if you often feel depressed.  Tell your health care provider if you have ever been abused or do not feel safe at home. This information is not intended to replace advice given to you by your health care provider. Make sure you discuss any questions you have with your health care provider. Document Released: 08/27/2010 Document Revised: 07/20/2015 Document Reviewed: 11/15/2014 Elsevier Interactive Patient Education  2018  [employer].

## 2017-04-25 ENCOUNTER — Encounter (HOSPITAL_COMMUNITY): Payer: Self-pay | Admitting: Emergency Medicine

## 2017-04-25 ENCOUNTER — Emergency Department (HOSPITAL_COMMUNITY)
Admission: EM | Admit: 2017-04-25 | Discharge: 2017-04-25 | Disposition: A | Discharge status: Home / Self Care | Payer: 59 | Attending: Emergency Medicine | Admitting: Emergency Medicine

## 2017-04-25 ENCOUNTER — Emergency Department (HOSPITAL_COMMUNITY): Payer: 59

## 2017-04-25 ENCOUNTER — Other Ambulatory Visit: Payer: Self-pay

## 2017-04-25 DIAGNOSIS — Z79899 Other long term (current) drug therapy: Secondary | ICD-10-CM | POA: Diagnosis not present

## 2017-04-25 DIAGNOSIS — R05 Cough: Secondary | ICD-10-CM | POA: Insufficient documentation

## 2017-04-25 DIAGNOSIS — Z7982 Long term (current) use of aspirin: Secondary | ICD-10-CM | POA: Diagnosis not present

## 2017-04-25 DIAGNOSIS — R079 Chest pain, unspecified: Secondary | ICD-10-CM | POA: Diagnosis not present

## 2017-04-25 DIAGNOSIS — R0789 Other chest pain: Secondary | ICD-10-CM | POA: Diagnosis not present

## 2017-04-25 DIAGNOSIS — E119 Type 2 diabetes mellitus without complications: Secondary | ICD-10-CM | POA: Insufficient documentation

## 2017-04-25 DIAGNOSIS — Z7984 Long term (current) use of oral hypoglycemic drugs: Secondary | ICD-10-CM | POA: Insufficient documentation

## 2017-04-25 DIAGNOSIS — I10 Essential (primary) hypertension: Secondary | ICD-10-CM | POA: Insufficient documentation

## 2017-04-25 LAB — BASIC METABOLIC PANEL
Anion gap: 10 (ref 5–15)
BUN: 9 mg/dL (ref 6–20)
CALCIUM: 9.3 mg/dL (ref 8.9–10.3)
CO2: 25 mmol/L (ref 22–32)
Chloride: 105 mmol/L (ref 101–111)
Creatinine, Ser: 0.66 mg/dL (ref 0.44–1.00)
GFR calc Af Amer: >60 mL/min (ref >60)
GLUCOSE: 134 mg/dL — AB (ref 65–99)
Potassium: 3.8 mmol/L (ref 3.5–5.1)
Sodium: 140 mmol/L (ref 135–145)

## 2017-04-25 LAB — CBC
HEMATOCRIT: 38.6 % (ref 36.0–46.0)
Hemoglobin: 12 g/dL (ref 12.0–15.0)
MCH: 22.6 pg — AB (ref 26.0–34.0)
MCHC: 31.1 g/dL (ref 30.0–36.0)
MCV: 72.8 fL — ABNORMAL LOW (ref 78.0–100.0)
Platelets: 231 10*3/uL (ref 150–400)
RBC: 5.3 MIL/uL — ABNORMAL HIGH (ref 3.87–5.11)
RDW: 14.5 % (ref 11.5–15.5)
WBC: 8 10*3/uL (ref 4.0–10.5)

## 2017-04-25 LAB — I-STAT TROPONIN, ED
TROPONIN I, POC: 0 ng/mL (ref 0.00–0.08)
TROPONIN I, POC: 0 ng/mL (ref 0.00–0.08)

## 2017-04-25 LAB — I-STAT BETA HCG BLOOD, ED (MC, WL, AP ONLY): I-stat hCG, quantitative: <5 m[IU]/mL (ref <5)

## 2017-04-25 MED ORDER — ACETAMINOPHEN 500 MG PO TABS
1000.0000 mg | ORAL_TABLET | Freq: Once | ORAL | Status: AC
  Administered 2017-04-25: 1000 mg via ORAL
  Filled 2017-04-25: qty 2

## 2017-04-25 NOTE — Discharge Instructions (Signed)
Your evaluation today has been reassuring overall, EKG, lab work and chest x-ray show no evidence of acute cardiac or lung problem to be causing your chest pain today, and you are at low risk for blood clot.  This pain may be musculoskeletal you can continue to take Tylenol, and use ice and heat as needed.  Please follow-up with your primary care doctor regarding this. Your blood pressure was elevated today, please follow up with your primary doctor within 1 week for blood pressure recheck.  If your chest pain returns and is worsened, or you are having difficulty breathing, chest pain is radiating to the arm neck or jaw, or is worse with exertion or other new or concerning symptoms develop please return to the ED for reevaluation.

## 2017-04-25 NOTE — ED Provider Notes (Signed)
[facility] EMERGENCY DEPARTMENT Provider Note   CSN: [id number] Arrival date & time: 04/25/17  3235     History   Chief Complaint Chief Complaint  Patient presents with  . Chest Pain    HPI  Victoria Padilla is a 52 y.o. Female with a history of hypertension, diabetes, hyperlipidemia, sarcoidosis, who presents to the ED for evaluation of left-sided chest pain under the right breast which is been present since Tuesday.  Patient describes pain as constant dull ache, denies sharp pains, sensation of chest pressure or tightness.  Pain is not pleuritic.  Does not radiate to the arm neck or jaw.  Patient feels that pain is made slightly worse by being up and about but is present at rest as well.  Denies any other aggravating or alleviating factors, has not taken any medications to treat this pain.  She reports she has had some intermittent coughing after URI a few weeks ago, no fevers or chills.  Patient denies associated shortness of breath.  No associated syncope, lightheadedness or dizziness.  Reports in the past she is felt some heart fluttering but denies any associated with this chest pain, no diagnosis of A. fib or other arrhythmias.  No pain radiating to the back.  Reports at one point yesterday she started to feel nauseous but did not have any episodes of vomiting, denies continued nausea or diaphoresis.  No history of prior cardiac events, non-smoker, key risk factors include diabetes and hypertension. Denies exogenous estrogen use, lower extremity pain or swelling, recent travel or immobilization, history of PE or DVT, family or personal history of bleeding or clotting disorders, or hemoptysis.        Past Medical History:  Diagnosis Date  . Acid reflux   . Anemia   . Arthritis   . Cervical dysplasia   . Diabetes mellitus   . Heart murmur   . Hyperlipidemia   . Hypertension   . LBP (low back pain)   . Obesity   . Sarcoidosis   . Vitamin D deficiency      Patient Active Problem List   Diagnosis Date Noted  . GERD (gastroesophageal reflux disease) 12/21/2015  . Personal history of noncompliance with medical treatment, presenting hazards to health 12/21/2015  . Morbid obesity ([place]) 03/03/2015  . History of anemia 03/03/2015  . Hyperlipidemia associated with type 2 diabetes mellitus ([place]) 03/03/2015  . Hypertension associated with diabetes ([place]) 03/03/2015  . 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 Uncontrolled type 2 diabetes mellitus with complication ([place])   . Arthritis   . Cervical dysplasia   . Sarcoidosis     Past Surgical History:  Procedure Laterality Date  . BUNIONECTOMY    . CHOLECYSTECTOMY    . COLPOSCOPY    . GYNECOLOGIC CRYOSURGERY    . KNEE ARTHROSCOPY    . TUBAL LIGATION      OB History    Gravida Para Term Preterm AB Living   3 3 3     3    SAB TAB Ectopic Multiple Live Births                   Home Medications    Prior to Admission medications   Medication Sig Start Date End Date Taking? Authorizing Provider  albuterol (PROVENTIL HFA;VENTOLIN HFA) 108 (90 Base) MCG/ACT inhaler Inhale 2 puffs into the lungs every 6 (six) hours as needed for wheezing or shortness of breath. 02/08/16   Raenette Rover,  [name] L, NP-C  aspirin 81 MG tablet Take 1 tablet (81 mg total) by mouth daily. Reported on 08/08/2015 08/09/15   Tysinger, Camelia Eng, PA-C  benazepril (LOTENSIN) 40 MG tablet Take 1 tablet (40 mg total) by mouth daily. 04/01/17   Denita Lung, MD  cholecalciferol (VITAMIN D) 1000 units tablet Take 2,000 Units by mouth daily.    [provider]  dapagliflozin propanediol (FARXIGA) 5 MG TABS tablet Take 5 mg by mouth daily. 03/10/17   Denita Lung, MD  glipiZIDE (GLUCOTROL) 10 MG tablet TAKE ONE TABLET BY MOUTH ONCE DAILY BEFORE BREAKFAST 03/03/17   Henson, [name] L, NP-C  hydrochlorothiazide (HYDRODIURIL) 25 MG tablet Take 1 tablet (25 mg total) by mouth daily. 12/04/16   Henson,  [name] L, NP-C  metFORMIN (GLUCOPHAGE) 1000 MG tablet Take 1 tablet (1,000 mg total) by mouth 2 (two) times daily with a meal. 02/11/17   Henson, [name] L, NP-C  Multiple Vitamins-Minerals (MULTIVITAMIN PO) Take 2 capsules by mouth 2 (two) times daily. Reported on 03/10/2015 06/11/13   [provider]  Omega-3 Fatty Acids (FISH OIL PO) Take 1 capsule by mouth daily. Reported on 08/08/2015    [provider]  Jonetta Speak LANCETS 69G MISC Test twice a day. Pt uses a one touch verio flex meter 12/05/16   Henson, [name] L, NP-C  ONETOUCH VERIO test strip TEST TWICE A DAY. PT USES A ONE TOUCH VERIO FLEX METER 04/04/17   Henson, [name] L, NP-C  simvastatin (ZOCOR) 20 MG tablet Take 1 tablet (20 mg total) by mouth at bedtime. 03/03/17   [name], NP-C    Family History Family History  Problem Relation Age of Onset  . Heart failure Mother 6       chf  . Heart failure Father   . Cancer Brother 65       prostate  . Hyperlipidemia Brother   . Hypertension Brother   . Diabetes Brother   . Hypertension Maternal Uncle   . Hyperlipidemia Brother   . Diabetes Brother   . Colon cancer Maternal Grandfather   . Colon polyps Brother   . Heart disease Brother   . Stomach cancer Neg Hx   . Esophageal cancer Neg Hx   . Rectal cancer Neg Hx   . Liver cancer Neg Hx     Social History Social History   Tobacco Use  . Smoking status: Never Smoker  . Smokeless tobacco: Never Used  Substance Use Topics  . Alcohol use: No  . Drug use: No     Allergies   Naprosyn [naproxen]   Review of Systems Review of Systems  Constitutional: Negative for chills and fever.  HENT: Negative for congestion, postnasal drip, rhinorrhea and sore throat.   Eyes: Negative for visual disturbance.  Respiratory: Positive for cough. Negative for chest tightness, shortness of breath and wheezing.   Cardiovascular: Positive for chest pain. Negative for palpitations and leg swelling.    Gastrointestinal: Negative for abdominal pain, diarrhea, nausea and vomiting.  Genitourinary: Negative for dysuria and frequency.  Musculoskeletal: Negative for arthralgias and myalgias.  Skin: Negative for color change and rash.  Neurological: Negative for dizziness, syncope and light-headedness.     Physical Exam Updated Vital Signs BP (!) 169/95   Pulse 72   Temp 98.2 F (36.8 C) (Oral)   Resp 13   Ht 5\' 3"  (1.6 m)   Wt 115.7 kg (255 lb)   SpO2 99%   BMI 45.17 kg/m  Physical Exam  Constitutional: She appears well-developed and well-nourished. No distress.  HENT:  Head: Normocephalic and atraumatic.  Mouth/Throat: Oropharynx is clear and moist.  Eyes: EOM are normal. Pupils are equal, round, and reactive to light. Right eye exhibits no discharge. Left eye exhibits no discharge.  No nystagmus, visual fields intact  Neck: Neck supple. No JVD present. No tracheal deviation present.  Cardiovascular: Normal rate, regular rhythm, normal heart sounds and intact distal pulses.  Pulses:      Radial pulses are 2+ on the right side, and 2+ on the left side.       Dorsalis pedis pulses are 2+ on the right side, and 2+ on the left side.  Pulmonary/Chest: Effort normal and breath sounds normal. No stridor. No respiratory distress. She has no wheezes. She has no rales.  Respirations equal and unlabored, patient able to speak in full sentences, lungs clear to auscultation bilaterally  Abdominal: Soft. Bowel sounds are normal. She exhibits no distension and no mass. There is no tenderness. There is no guarding.  Musculoskeletal: She exhibits no edema or deformity.  Bilateral lower extremities without appreciable edema, nontender  Neurological: She is alert. Coordination normal.  Speech is clear, able to follow commands CN III-XII intact Normal strength in upper and lower extremities bilaterally including dorsiflexion and plantar flexion, strong and equal grip strength Sensation normal  to light and sharp touch Moves extremities without ataxia, coordination intact Normal finger to nose and rapid alternating movements No pronator drift  Skin: Skin is warm and dry. Capillary refill takes less than 2 seconds. She is not diaphoretic.  Psychiatric: She has a normal mood and affect. Her behavior is normal.  Nursing note and vitals reviewed.    ED Treatments / Results  Labs (all labs ordered are listed, but only abnormal results are displayed) Labs Reviewed  BASIC METABOLIC PANEL - Abnormal; Notable for the following components:      Result Value   Glucose, Bld 134 (*)    All other components within normal limits  CBC - Abnormal; Notable for the following components:   RBC 5.30 (*)    MCV 72.8 (*)    MCH 22.6 (*)    All other components within normal limits  I-STAT TROPONIN, ED  I-STAT BETA HCG BLOOD, ED ([facility], [facility], [facility] ONLY)  I-STAT TROPONIN, ED    EKG  EKG Interpretation  Date/Time:  Friday April 25 2017 03:44:35 EST Ventricular Rate:  93 PR Interval:  158 QRS Duration: 84 QT Interval:  354 QTC Calculation: 440 R Axis:   25 Text Interpretation:  Normal sinus rhythm Normal ECG Confirmed by Milton Ferguson [phone]) on 04/25/2017 9:47:21 AM       Radiology Dg Chest 2 View  Result Date: 04/25/2017 CLINICAL DATA:  52 year old female with chest pain EXAM: CHEST  2 VIEW COMPARISON:  Chest radiograph dated 09/11/2013 FINDINGS: The heart size and mediastinal contours are within normal limits. Both lungs are clear. The visualized skeletal structures are unremarkable. IMPRESSION: No active cardiopulmonary disease. Electronically Signed   By: Anner Crete M.D.   On: 04/25/2017 04:19    Procedures Procedures (including critical care time)  Medications Ordered in ED Medications  acetaminophen (TYLENOL) tablet 1,000 mg (1,000 mg Oral Given 04/25/17 1941)     Initial Impression / Assessment and Plan / ED Course  I have reviewed the triage vital signs and the nursing  notes.  Pertinent labs & imaging results that were available during my care of the patient  were reviewed by me and considered in my medical decision making (see chart for details).  Patient is to be discharged with recommendation to follow up with PCP in regards to today's hospital visit. Chest pain is not likely of cardiac or pulmonary etiology d/t presentation, PERC negative, VSS, no tracheal deviation, no JVD or new murmur, RRR, breath sounds equal bilaterally, EKG without acute abnormalities, negative troponin, and negative CXR. Pt with HEART pathway score of 3 so will repeat troponin. Tylenol given for pain. Pt's BP is elevated today, reports she took her benazepril last night as usual, she reports this is where her blood pressure has been recently and she is working with her PCP to get this under better control.  Labs overall unremarkable, normal Cr, no evidence of kidney damage from HTN. No leukocytosis and hgb normal. CXR without signs of cardiopulmonary disease. Repeat troponin also negative and chest pain has improved with tylenol. BP has improved as well without intervention. Pain is reproducible on exam with palpation, I suspect there may be musculoskeletal element, encouraged pt to continue using tylenol, ice and heat.  Pt has been advised to return to the ED if CP becomes exertional, associated with diaphoresis or nausea, radiates to left jaw/arm, worsens or becomes concerning in any way. Pt appears reliable for follow up and is agreeable to discharge.   Vitals:   04/25/17 0715 04/25/17 0725 04/25/17 0745 04/25/17 0815  BP: (!) 163/98 (!) 163/98 (!) 157/96 (!) 155/87  Pulse: 74 76 83 76  Resp: 14 18 15 14   Temp:      TempSrc:      SpO2: 96% 97% 96% 96%  Weight:      Height:         Final Clinical Impressions(s) / ED Diagnoses   Final diagnoses:  Atypical chest pain    ED Discharge Orders    None       Janet Berlin 93/81/01 7510    Delora Fuel, MD 25/85/27  2230

## 2017-04-25 NOTE — ED Triage Notes (Signed)
Pt reports intermittent CP under L breast since yesterday, states R hand weakness. No neuro deficits in triage.

## 2017-04-28 LAB — PAP, TP IMAGING W/ HPV RNA, RFLX HPV TYPE 16,18/45: HPV DNA High Risk: NOT DETECTED

## 2017-05-01 ENCOUNTER — Ambulatory Visit
Admission: RE | Admit: 2017-05-01 | Discharge: 2017-05-01 | Disposition: A | Discharge status: Home / Self Care | Payer: 59 | Source: Ambulatory Visit | Attending: Family Medicine | Admitting: Family Medicine

## 2017-05-01 DIAGNOSIS — Z1231 Encounter for screening mammogram for malignant neoplasm of breast: Secondary | ICD-10-CM

## 2017-05-02 ENCOUNTER — Other Ambulatory Visit: Payer: Self-pay

## 2017-05-02 DIAGNOSIS — I10 Essential (primary) hypertension: Secondary | ICD-10-CM

## 2017-05-02 MED ORDER — HYDROCHLOROTHIAZIDE 25 MG PO TABS: 25.0000 mg | ORAL_TABLET | Freq: Every day | ORAL | 2 refills | Status: DC

## 2017-05-05 ENCOUNTER — Other Ambulatory Visit: Payer: Self-pay | Admitting: Family Medicine

## 2017-05-05 DIAGNOSIS — R921 Mammographic calcification found on diagnostic imaging of breast: Secondary | ICD-10-CM

## 2017-05-06 ENCOUNTER — Other Ambulatory Visit: Payer: Self-pay | Admitting: Family Medicine

## 2017-05-06 ENCOUNTER — Ambulatory Visit
Admission: RE | Admit: 2017-05-06 | Discharge: 2017-05-06 | Disposition: A | Discharge status: Home / Self Care | Payer: 59 | Source: Ambulatory Visit | Attending: Family Medicine | Admitting: Family Medicine

## 2017-05-06 DIAGNOSIS — R921 Mammographic calcification found on diagnostic imaging of breast: Secondary | ICD-10-CM

## 2017-05-07 NOTE — Progress Notes (Signed)
Subjective:    Patient ID: Victoria Padilla, female    DOB: [birth year], 52 y.o.   MRN: [id number]  Victoria Padilla is a 52 y.o. female who presents for follow-up of Type 2 diabetes mellitus.  She did not follow up for her one month diabetes visit as recommended after her last hemoglobin A1c was >10. We added Farxiga at that time.   States she stopped [name] after taking it for a month because she read about potential side effects. She denies having any side effects.   Reports being diagnosed with diabetes while being pregnant with her 3rd child in her 36s.  Denies seeing endocrinology in the past.  She has not wanted to be on insulin, only wants to take oral medications. She tried once weekly GLP-1 in the past but this was not affordable.   BP at home has been elevated.  Her benazepril was increased recently.   Recent work up in the ED for chest pain and negative cardiac etiology was determined. She was told it was a [facility] etiology.  No longer having chest pain  Recent mammogram showed suspicious mass and she is scheduled to have a biopsy next week. She is worried about this.   Patient is checking home blood sugars.   Home blood sugar records: BGs range between 140 and 200 How often is blood sugars being checked: twice a day Current symptoms include: nausea. Patient denies visual disturbances, vomiting and weight loss.  Patient is checking their feet daily. Any Foot concerns (callous, ulcer, wound, thickened nails, toenail fungus, skin fungus, hammer toe): none Last dilated eye exam:  in 2017- mild retinopathy. Has an eye exam scheduled for next week.   Current treatments: Metformin and glipizide  Medication compliance: good  Current diet: in general, a "healthy" diet    Current exercise: walking every other day for 15 minutes  Known diabetic complications: mild retinopathy. Urine microalbumin/creatinine ratio elevated in 11/2016  The following portions of the patient's history  were reviewed and updated as appropriate: allergies, current medications, past medical history, past social history and problem list.  ROS as in subjective above.     Objective:    Physical Exam Alert and in no distress otherwise not examined.  Blood pressure 130/90, pulse 86, weight 257 lb 12.8 oz (116.9 kg).  Lab Review Diabetic Labs Latest Ref Rng & Units 05/08/2017 04/25/2017 12/04/2016 03/20/2016 01/23/2016  HbA1c - 9.1% - 10.3% 8.4% -  Microalbumin mg/dL - - 12.4 - -  Micro/Creat Ratio <30 mcg/mg creat - - 139(H) - -  Chol <200 mg/dL - - 214(H) - 127  HDL >50 mg/dL - - 61 - 42(L)  Calc LDL mg/dL (calc) - - 121(H) - 58  Triglycerides <150 mg/dL - - 197(H) - 137  Creatinine 0.44 - 1.00 mg/dL - 0.66 0.66 - -   BP/Weight 05/08/2017 04/25/2017 04/23/2017 04/01/2017 78/05/6960  Systolic BP 952 841 324 401 027  Diastolic BP 90 87 80 253 80  Wt. (Lbs) 257.8 255 259 254.2 248.2  BMI 45.67 45.17 45.88 45.03 43.97   Foot/eye exam completion dates Latest Ref Rng & Units 12/04/2016 09/26/2015  Eye Exam No Retinopathy - Retinopathy(A)  Foot Form Completion - Done -    Victoria Padilla  reports that  has never smoked. she has never used smokeless tobacco. She reports that she does not drink alcohol or use drugs.     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 ([place]) -  Plan: HgB A1c, CBC with Differential/Platelet, Comprehensive metabolic panel, TSH, Microalbumin / creatinine urine ratio, Ambulatory referral to Endocrinology, POCT Urinalysis DIP (Proadvantage Device)  Hyperlipidemia associated with type 2 diabetes mellitus ([place]) - Plan: Lipid panel  Hypertension associated with diabetes ([place]) - Plan: CBC with Differential/Platelet, Comprehensive metabolic panel, Microalbumin / creatinine urine ratio, POCT Urinalysis DIP (Proadvantage Device)  Morbid obesity ([place]) - Plan: Lipid panel  Medication management - Plan: Lipid panel  Personal history of noncompliance with medical  treatment, presenting hazards to health  1. Rx changes: she will start back on Farxiga Hgb A1c 9.1% she prefers to start [name], she has this at home and declines insulin for now. Referral to endocrinology for better control.  2. Education: Reviewed 'ABCs' of diabetes management (respective goals in parentheses):  A1C (<7), blood pressure (<130/80), and cholesterol (LDL <100). 3. Compliance at present is estimated to be fair. Efforts to improve compliance (if necessary) will be directed at dietary modifications: cut back on carbohydrates and sugar, increased exercise, regular blood sugar monitoring: daily and referral to endocrinology . 4. HTN- she will keep an eye on her BP at home and follow up with me in 4 weeks with her readings.  5. Hyperlipidemia- check fasting lipids and adjust statin if needed.  6. Urine microalbumin ordered. Ratio was elevated previously.  7. Breast biopsy next week  8. Follow up: 4 weeks for HTN

## 2017-05-08 ENCOUNTER — Encounter: Payer: Self-pay | Admitting: Family Medicine

## 2017-05-08 ENCOUNTER — Ambulatory Visit: Payer: 59 | Admitting: Family Medicine

## 2017-05-08 VITALS — BP 130/90 | HR 86 | Wt 257.8 lb

## 2017-05-08 DIAGNOSIS — E1139 Type 2 diabetes mellitus with other diabetic ophthalmic complication: Secondary | ICD-10-CM

## 2017-05-08 DIAGNOSIS — Z9119 Patient's noncompliance with other medical treatment and regimen: Secondary | ICD-10-CM

## 2017-05-08 DIAGNOSIS — I10 Essential (primary) hypertension: Secondary | ICD-10-CM | POA: Diagnosis not present

## 2017-05-08 DIAGNOSIS — E785 Hyperlipidemia, unspecified: Secondary | ICD-10-CM

## 2017-05-08 DIAGNOSIS — E1169 Type 2 diabetes mellitus with other specified complication: Secondary | ICD-10-CM

## 2017-05-08 DIAGNOSIS — I152 Hypertension secondary to endocrine disorders: Secondary | ICD-10-CM

## 2017-05-08 DIAGNOSIS — IMO0002 Reserved for concepts with insufficient information to code with codable children: Secondary | ICD-10-CM

## 2017-05-08 DIAGNOSIS — E1159 Type 2 diabetes mellitus with other circulatory complications: Secondary | ICD-10-CM | POA: Diagnosis not present

## 2017-05-08 DIAGNOSIS — Z91199 Patient's noncompliance with other medical treatment and regimen due to unspecified reason: Secondary | ICD-10-CM

## 2017-05-08 DIAGNOSIS — Z79899 Other long term (current) drug therapy: Secondary | ICD-10-CM

## 2017-05-08 DIAGNOSIS — E1165 Type 2 diabetes mellitus with hyperglycemia: Secondary | ICD-10-CM

## 2017-05-08 LAB — POCT URINALYSIS DIP (PROADVANTAGE DEVICE)
BILIRUBIN UA: NEGATIVE
BILIRUBIN UA: NEGATIVE mg/dL
GLUCOSE UA: NEGATIVE mg/dL
Leukocytes, UA: NEGATIVE
Nitrite, UA: NEGATIVE
RBC UA: NEGATIVE
SPECIFIC GRAVITY, URINE: 1.03
Urobilinogen, Ur: NEGATIVE
pH, UA: 6 (ref 5.0–8.0)

## 2017-05-08 LAB — POCT GLYCOSYLATED HEMOGLOBIN (HGB A1C)

## 2017-05-08 NOTE — Patient Instructions (Addendum)
Your hemoglobin A1c is 9.1% today and still uncontrolled. I am referring you to the endocrinologist for help with your diabetes.  Continue on Metformin and glipizide and start back on Farxiga.   Continue eating low carbohydrate diet (no more than 45-60 carbs per meal and 15 carbs for a snack) Be more physically active.   Continue checking your blood pressure at home. It is elevated today and we may need to adjust your medication once I get your lab results.   Continue on your cholesterol medication.   We will call you with your lab results.   Follow up with me for hypertension in 4 weeks.   Get your diabetic eye exam as scheduled.

## 2017-05-09 LAB — COMPREHENSIVE METABOLIC PANEL
ALT: 35 IU/L — ABNORMAL HIGH (ref 0–32)
AST: 25 IU/L (ref 0–40)
Albumin/Globulin Ratio: 1.3 (ref 1.2–2.2)
Albumin: 4.1 g/dL (ref 3.5–5.5)
Alkaline Phosphatase: 106 IU/L (ref 39–117)
BUN/Creatinine Ratio: 17 (ref 9–23)
BUN: 10 mg/dL (ref 6–24)
Bilirubin Total: <0.2 mg/dL (ref 0.0–1.2)
CO2: 23 mmol/L (ref 20–29)
Calcium: 9.6 mg/dL (ref 8.7–10.2)
Chloride: 103 mmol/L (ref 96–106)
Creatinine, Ser: 0.6 mg/dL (ref 0.57–1.00)
GFR calc Af Amer: 120 mL/min/{1.73_m2} (ref >59)
GFR calc non Af Amer: 104 mL/min/{1.73_m2} (ref >59)
Globulin, Total: 3.2 g/dL (ref 1.5–4.5)
Glucose: 146 mg/dL — ABNORMAL HIGH (ref 65–99)
Potassium: 4.7 mmol/L (ref 3.5–5.2)
Sodium: 142 mmol/L (ref 134–144)
Total Protein: 7.3 g/dL (ref 6.0–8.5)

## 2017-05-09 LAB — CBC WITH DIFFERENTIAL/PLATELET
Basophils Absolute: 0 10*3/uL (ref 0.0–0.2)
Basos: 0 %
EOS (ABSOLUTE): 0 10*3/uL (ref 0.0–0.4)
Eos: 1 %
Hematocrit: 40.2 % (ref 34.0–46.6)
Hemoglobin: 12.6 g/dL (ref 11.1–15.9)
IMMATURE GRANS (ABS): 0 10*3/uL (ref 0.0–0.1)
Immature Granulocytes: 0 %
LYMPHS: 51 %
Lymphocytes Absolute: 3.5 10*3/uL — ABNORMAL HIGH (ref 0.7–3.1)
MCH: 22.9 pg — AB (ref 26.6–33.0)
MCHC: 31.3 g/dL — ABNORMAL LOW (ref 31.5–35.7)
MCV: 73 fL — ABNORMAL LOW (ref 79–97)
MONOCYTES: 6 %
Monocytes Absolute: 0.4 10*3/uL (ref 0.1–0.9)
NEUTROS ABS: 2.8 10*3/uL (ref 1.4–7.0)
NEUTROS PCT: 42 %
PLATELETS: 251 10*3/uL (ref 150–379)
RBC: 5.51 x10E6/uL — AB (ref 3.77–5.28)
RDW: 16 % — ABNORMAL HIGH (ref 12.3–15.4)
WBC: 6.8 10*3/uL (ref 3.4–10.8)

## 2017-05-09 LAB — LIPID PANEL
CHOL/HDL RATIO: 3 ratio (ref 0.0–4.4)
Cholesterol, Total: 149 mg/dL (ref 100–199)
HDL: 50 mg/dL (ref >39)
LDL CALC: 81 mg/dL (ref 0–99)
TRIGLYCERIDES: 91 mg/dL (ref 0–149)
VLDL CHOLESTEROL CAL: 18 mg/dL (ref 5–40)

## 2017-05-09 LAB — MICROALBUMIN / CREATININE URINE RATIO
Creatinine, Urine: 190.1 mg/dL
Microalb/Creat Ratio: 69.5 mg/g creat — ABNORMAL HIGH (ref 0.0–30.0)
Microalbumin, Urine: 132.2 ug/mL

## 2017-05-09 LAB — TSH: TSH: 0.843 u[IU]/mL (ref 0.450–4.500)

## 2017-05-12 ENCOUNTER — Ambulatory Visit
Admission: RE | Admit: 2017-05-12 | Discharge: 2017-05-12 | Disposition: A | Discharge status: Home / Self Care | Payer: 59 | Source: Ambulatory Visit | Attending: Family Medicine | Admitting: Family Medicine

## 2017-05-12 DIAGNOSIS — N6012 Diffuse cystic mastopathy of left breast: Secondary | ICD-10-CM | POA: Diagnosis not present

## 2017-05-12 DIAGNOSIS — R921 Mammographic calcification found on diagnostic imaging of breast: Secondary | ICD-10-CM

## 2017-05-13 LAB — HEPATITIS PANEL, ACUTE
Hep A IgM: NEGATIVE
Hep B C IgM: NEGATIVE
Hep C Virus Ab: <0.1 s/co ratio (ref 0.0–0.9)
Hepatitis B Surface Ag: NEGATIVE

## 2017-05-13 LAB — SPECIMEN STATUS REPORT

## 2017-05-27 ENCOUNTER — Encounter: Payer: Self-pay | Admitting: Endocrinology

## 2017-05-27 ENCOUNTER — Ambulatory Visit: Payer: 59 | Admitting: Endocrinology

## 2017-05-27 DIAGNOSIS — E041 Nontoxic single thyroid nodule: Secondary | ICD-10-CM | POA: Diagnosis not present

## 2017-05-27 MED ORDER — METFORMIN HCL ER 500 MG PO TB24: 2000.0000 mg | ORAL_TABLET | Freq: Every day | ORAL | 3 refills | Status: DC

## 2017-05-27 MED ORDER — GLIPIZIDE 5 MG PO TABS: 5.0000 mg | ORAL_TABLET | ORAL | 3 refills | Status: DC

## 2017-05-27 MED ORDER — SITAGLIPTIN PHOSPHATE 100 MG PO TABS: 100.0000 mg | ORAL_TABLET | Freq: Every day | ORAL | 3 refills | Status: DC

## 2017-05-27 NOTE — Progress Notes (Signed)
Subjective:    Patient ID: Victoria Padilla, female    DOB: 27-Aug-1965, 51 y.o.   MRN: [id number]  HPI pt is referred by Harland Dingwall, NP, for diabetes.  Pt had GDM in 1998, but it DM persisted after the pregnancy; she has mild if any neuropathy of the lower extremities, but she has associated DR; she has never been on insulin, but she took trulicity for a few months in 2016; pt says her diet is good, but exercise is not good; she has never had pancreatitis, pancreatic surgery, severe hypoglycemia or DKA.  She takes 3 oral meds.  She says cbg's vary from 100-200.   Past Medical History:  Diagnosis Date  . Acid reflux   . Anemia   . Arthritis   . Cervical dysplasia   . Diabetes mellitus   . Heart murmur   . Hyperlipidemia   . Hypertension   . LBP (low back pain)   . Obesity   . Sarcoidosis   . Vitamin D deficiency     Past Surgical History:  Procedure Laterality Date  . BUNIONECTOMY    . CHOLECYSTECTOMY    . COLPOSCOPY    . GYNECOLOGIC CRYOSURGERY    . KNEE ARTHROSCOPY    . TUBAL LIGATION      Social History   Socioeconomic History  . Marital status: Single    Spouse name: Not on file  . Number of children: 3  . Years of education: Not on file  . Highest education level: Not on file  Occupational History    Employer: [employer]  . Financial resource strain: Not on file  . Food insecurity:    Worry: Not on file    Inability: Not on file  . Transportation needs:    Medical: Not on file    Non-medical: Not on file  Tobacco Use  . Smoking status: Never Smoker  . Smokeless tobacco: Never Used  Substance and Sexual Activity  . Alcohol use: No  . Drug use: No  . Sexual activity: Never    Birth control/protection: Surgical  Lifestyle  . Physical activity:    Days per week: Not on file    Minutes per session: Not on file  . Stress: Not on file  Relationships  . Social connections:    Talks on phone: Not on file    Gets together: Not on file   Attends religious service: Not on file    Active member of club or organization: Not on file    Attends meetings of clubs or organizations: Not on file    Relationship status: Not on file  . Intimate partner violence:    Fear of current or ex partner: Not on file    Emotionally abused: Not on file    Physically abused: Not on file    Forced sexual activity: Not on file  Other Topics Concern  . Not on file  Social History Narrative  . Not on file    Current Outpatient Medications on File Prior to Visit  Medication Sig Dispense Refill  . albuterol (PROVENTIL HFA;VENTOLIN HFA) 108 (90 Base) MCG/ACT inhaler Inhale 2 puffs into the lungs every 6 (six) hours as needed for wheezing or shortness of breath. 1 Inhaler 0  . aspirin 81 MG tablet Take 1 tablet (81 mg total) by mouth daily. Reported on 08/08/2015 90 tablet 3  . benazepril (LOTENSIN) 40 MG tablet Take 1 tablet (40 mg total) by mouth daily. [place]  tablet 1  . cholecalciferol (VITAMIN D) 1000 units tablet Take 2,000 Units by mouth daily.    . hydrochlorothiazide (HYDRODIURIL) 25 MG tablet Take 1 tablet (25 mg total) by mouth daily. 30 tablet 2  . Multiple Vitamins-Minerals (MULTIVITAMIN PO) Take 2 capsules by mouth 2 (two) times daily. Reported on 03/10/2015    . Omega-3 Fatty Acids (FISH OIL PO) Take 1 capsule by mouth daily. Reported on 08/08/2015    . ONETOUCH DELICA LANCETS 78E MISC Test twice a day. Pt uses a one touch verio flex meter 100 each 5  . ONETOUCH VERIO test strip TEST TWICE A DAY. PT USES A ONE TOUCH VERIO FLEX METER 100 each 1   Current Facility-Administered Medications on File Prior to Visit  Medication Dose Route Frequency Provider Last Rate Last Dose  . 0.9 %  sodium chloride infusion  500 mL Intravenous Continuous Armbruster, Carlota Raspberry, MD        Allergies  Allergen Reactions  . Naprosyn [Naproxen] Nausea Only    Family History  Problem Relation Age of Onset  . Heart failure Mother 58       chf  . Heart failure  Father   . Cancer Brother 40       prostate  . Hyperlipidemia Brother   . Hypertension Brother   . Diabetes Brother   . Hypertension Maternal Uncle   . Hyperlipidemia Brother   . Diabetes Brother   . Colon cancer Maternal Grandfather   . Colon polyps Brother   . Heart disease Brother   . Stomach cancer Neg Hx   . Esophageal cancer Neg Hx   . Rectal cancer Neg Hx   . Liver cancer Neg Hx     BP (!) 160/100 (BP Location: Right Arm, Patient Position: Sitting, Cuff Size: Normal)   Pulse (!) 101   Wt 261 lb 3.2 oz (118.5 kg)   BMI 46.27 kg/m    Review of Systems denies blurry vision, headache, sob, n/v, urinary frequency, muscle cramps, memory loss, depression, cold intolerance, and rhinorrhea.  She has weight gain.  She has intermitt chest pain (cardiac eval was neg).  She has nausea, dry skin, and easy bruising     Objective:   Physical Exam VS: see vs page GEN: no distress HEAD: head: no deformity eyes: no periorbital swelling, no proptosis external nose and ears are normal mouth: no lesion seen NECK: 1-2 cm right thyroid nodule is noted CHEST WALL: no deformity LUNGS: clear to auscultation CV: reg rate and rhythm, no murmur ABD: abdomen is soft, nontender.  no hepatosplenomegaly.  not distended.  no hernia MUSCULOSKELETAL: muscle bulk and strength are grossly normal.  no obvious joint swelling.  gait is normal and steady EXTEMITIES: no deformity.  no ulcer on the feet.  feet are of normal color and temp.  Trace bilat leg edema.  There is bilateral onychomycosis of the toenails PULSES: dorsalis pedis intact bilat.  no carotid bruit NEURO:  cn 2-12 grossly intact.   readily moves all 4's.  sensation is intact to touch on the feet.  SKIN:  Normal texture and temperature.  No rash or suspicious lesion is visible.   NODES:  None palpable at the neck.   PSYCH: alert, well-oriented.  Does not appear anxious nor depressed.   Lab Results  Component Value Date   HGBA1C 9.1%  05/08/2017    Lab Results  Component Value Date   CREATININE 0.60 05/08/2017   BUN 10 05/08/2017   NA  142 05/08/2017   K 4.7 05/08/2017   CL 103 05/08/2017   CO2 23 05/08/2017   Lab Results  Component Value Date   MICROALBUR 12.4 12/04/2016    I have reviewed outside records, and summarized: Pt was noted to have elevated a1c, and referred here.  She had declined insulin in the past.  She found trulicity too expensive.      Assessment & Plan:  Weight gain, new to me: she declines surgery.   Type 2 DM, with DR: we discussed rx options: she declines injectable medication.  She needs increased rx.    Patient Instructions  Let's check the ultrasound.  you will receive a phone call, about a day and time for an appointment. good diet and exercise significantly improve the control of your diabetes.  please let me know if you wish to be referred to a dietician.  high blood sugar is very risky to your health.  you should see an eye doctor and dentist every year.  It is very important to get all recommended vaccinations.  Controlling your blood pressure and cholesterol drastically reduces the damage diabetes does to your body.  Those who smoke should quit.  Please discuss these with your doctor.  check your blood sugar once a day.  vary the time of day when you check, between before the 3 meals, and at bedtime.  also check if you have symptoms of your blood sugar being too high or too low.  please keep a record of the readings and bring it to your next appointment here (or you can bring the meter itself).  You can write it on any piece of paper.  please call us sooner if your blood sugar goes below 70, or if you have a lot of readings over 200. I have sent 3 prescriptions to your pharmacy, to change metformin to extended-release, to reduce the glipizide, and to add "[place]." Please come back for a follow-up appointment in 6 weeks.

## 2017-05-27 NOTE — Patient Instructions (Addendum)
Let's check the ultrasound.  you will receive a phone call, about a day and time for an appointment. good diet and exercise significantly improve the control of your diabetes.  please let me know if you wish to be referred to a dietician.  high blood sugar is very risky to your health.  you should see an eye doctor and dentist every year.  It is very important to get all recommended vaccinations.  Controlling your blood pressure and cholesterol drastically reduces the damage diabetes does to your body.  Those who smoke should quit.  Please discuss these with your doctor.  check your blood sugar once a day.  vary the time of day when you check, between before the 3 meals, and at bedtime.  also check if you have symptoms of your blood sugar being too high or too low.  please keep a record of the readings and bring it to your next appointment here (or you can bring the meter itself).  You can write it on any piece of paper.  please call us sooner if your blood sugar goes below 70, or if you have a lot of readings over 200. I have sent 3 prescriptions to your pharmacy, to change metformin to extended-release, to reduce the glipizide, and to add "T[place]." Please come back for a follow-up appointment in 6 weeks.

## 2017-05-28 ENCOUNTER — Other Ambulatory Visit: Payer: Self-pay | Admitting: Family Medicine

## 2017-05-28 DIAGNOSIS — I10 Essential (primary) hypertension: Secondary | ICD-10-CM

## 2017-05-28 NOTE — Telephone Encounter (Signed)
Pt sees endocrinology now

## 2017-06-03 NOTE — Progress Notes (Signed)
Subjective:    Patient ID: Victoria Padilla, female    DOB: 1965-10-05, 52 y.o.   MRN: [id number]  HPI Chief Complaint  Patient presents with  . 4 week follow-up    4 week follow-up on bp. running good   She is here for a follow up on uncontrolled HTN.  Medications include benazepril 40 mg and HCTZ 25 mg.  States she only started the higher dose of benazepril 2 weeks ago.  Reports BP at home has been in the 140s/80s.   Diet: reports low in sodium  Exercise: walking some days but not often.  States she sits most of the day at work.   Reports persistent lower extremity edema that is not worsening. She has compression stockings at home and has not been wearing them recently. States edema improves after elevation overnight.   Denies fever, chills, dizziness, chest pain, palpitations, shortness of breath, abdominal pain, N/V/D, urinary symptoms.   She is under the care of Dr. Loanne Drilling now for uncontrolled diabetes and thyroid nodule.  Diabetes medication regimen was changed and she reports that she has not yet started on new medication.  States she has an upcoming [place] of her thyroid.   States she saw her OB/GYN for annual exam. Elon Alas, NP.   Diabetic eye exam- did not get this.   Reviewed allergies, medications, past medical, surgical, family, and social history.    Review of Systems Pertinent positives and negatives in the history of present illness.     Objective:   Physical Exam BP 140/84   Pulse 88   Ht 5\' 3"  (1.6 m)   Wt 253 lb 12.8 oz (115.1 kg)   BMI 44.96 kg/m   Alert and in no distress.   Cardiac exam shows a regular sinus rhythm without murmurs or gallops. Lungs are clear to auscultation. Extremities with non-pitting edema to feet and ankles bilaterally, normal cap refill, sensation and pulses palpable.       Assessment & Plan:  Uncontrolled hypertension  Morbid obesity ([place])  Uncontrolled type 2 diabetes with eye complications ([place]) - Plan:  Microalbumin / creatinine urine ratio  Peripheral edema  Microalbuminuria - Plan: Microalbumin / creatinine urine ratio  Discussed that her blood pressure is still not at goal range. Since she has only been on higher dose of benazepril for the past 2 weeks, we will give her more time and see how her blood pressure does over the next 2-4 weeks at home.  She will continue checking her blood pressures at home. Continue on low-sodium diet and I did encourage her to read the nutrition labels. Counseling on healthy diet and increasing her physical activity for chronic health conditions and weight loss. Discussed that peripheral edema is ongoing and not worsening.  Euvolemic. Advised to get up every hour while at work, walk more when given the opportunity and she may try using compression stockings.  She will let me know if this is getting worse or not improving. She is under the care of Dr. Lissa Merlin for diabetes and thyroid nodule currently.  Reports that she plans to start taking the new medication he prescribed today.  Plans to schedule thyroid ultrasound as recommended by Dr. Lissa Merlin. Discussed that blood work for kidney function is normal however she has had elevated urine microalbumin and we will recheck this today.  Advised to prevent kidney disease she should have good control of blood sugars, blood pressure and she verbalized understanding. States she is up to date  on preventive medication and declines returning for CPE. Recent pap smear at OB/GYN per patient.  She will follow-up in 3 months for hypertension or sooner if her blood pressures are not in goal range. Encouraged her to get her diabetic eye exam.

## 2017-06-04 ENCOUNTER — Encounter: Payer: Self-pay | Admitting: Family Medicine

## 2017-06-04 ENCOUNTER — Ambulatory Visit: Payer: 59 | Admitting: Family Medicine

## 2017-06-04 VITALS — BP 140/84 | HR 88 | Ht 63.0 in | Wt 253.8 lb

## 2017-06-04 DIAGNOSIS — I10 Essential (primary) hypertension: Secondary | ICD-10-CM

## 2017-06-04 DIAGNOSIS — R809 Proteinuria, unspecified: Secondary | ICD-10-CM

## 2017-06-04 DIAGNOSIS — R609 Edema, unspecified: Secondary | ICD-10-CM

## 2017-06-04 DIAGNOSIS — E1165 Type 2 diabetes mellitus with hyperglycemia: Secondary | ICD-10-CM | POA: Diagnosis not present

## 2017-06-04 DIAGNOSIS — E1139 Type 2 diabetes mellitus with other diabetic ophthalmic complication: Secondary | ICD-10-CM | POA: Diagnosis not present

## 2017-06-04 DIAGNOSIS — IMO0002 Reserved for concepts with insufficient information to code with codable children: Secondary | ICD-10-CM

## 2017-06-04 NOTE — Patient Instructions (Signed)
Continue on current medication and keep an eye on your BP at home. Goal range is less than 130/80.  Continue eating a low sodium diet. Read nutrition labels.  Increase your physical activity. Make sure you are getting up every hour at work. Sitting long periods can increase lower extremity edema.  You may want to wear compression stockings and see if this helps.  If you notice this problem worsening, let us know.   Follow up in 3 months for blood pressure check or sooner if your readings are not controlled.    Peripheral Edema Peripheral edema is swelling that is caused by a buildup of fluid. Peripheral edema most often affects the lower legs, ankles, and feet. It can also develop in the arms, hands, and face. The area of the body that has peripheral edema will look swollen. It may also feel heavy or warm. Your clothes may start to feel tight. Pressing on the area may make a temporary dent in your skin. You may not be able to move your arm or leg as much as usual. There are many causes of peripheral edema. It can be a complication of other diseases, such as congestive heart failure, kidney disease, or a problem with your blood circulation. It also can be a side effect of certain medicines. It often happens to women during pregnancy. Sometimes, the cause is not known. Treating the underlying condition is often the only treatment for peripheral edema. Follow these instructions at home: Pay attention to any changes in your symptoms. Take these actions to help with your discomfort:  Raise (elevate) your legs while you are sitting or lying down.  Move around often to prevent stiffness and to lessen swelling. Do not sit or stand for long periods of time.  Wear support stockings as told by your health care provider.  Follow instructions from your health care provider about limiting salt (sodium) in your diet. Sometimes eating less salt can reduce swelling.  Take over-the-counter and prescription  medicines only as told by your health care provider. Your health care provider may prescribe medicine to help your body get rid of excess water (diuretic).  Keep all follow-up visits as told by your health care provider. This is important.  Contact a health care provider if:  You have a fever.  Your edema starts suddenly or is getting worse, especially if you are pregnant or have a medical condition.  You have swelling in only one leg.  You have increased swelling and pain in your legs. Get help right away if:  You develop shortness of breath, especially when you are lying down.  You have pain in your chest or abdomen.  You feel weak.  You faint. This information is not intended to replace advice given to you by your health care provider. Make sure you discuss any questions you have with your health care provider. Document Released: 03/21/2004 Document Revised: 07/17/2015 Document Reviewed: 08/24/2014 Elsevier Interactive Patient Education  [employer].

## 2017-06-05 LAB — MICROALBUMIN / CREATININE URINE RATIO
Creatinine, Urine: 85.3 mg/dL
MICROALB/CREAT RATIO: 47 mg/g{creat} — AB (ref 0.0–30.0)
MICROALBUM., U, RANDOM: 40.1 ug/mL

## 2017-06-09 ENCOUNTER — Ambulatory Visit
Admission: RE | Admit: 2017-06-09 | Discharge: 2017-06-09 | Disposition: A | Discharge status: Home / Self Care | Payer: 59 | Source: Ambulatory Visit | Attending: Endocrinology | Admitting: Endocrinology

## 2017-06-09 DIAGNOSIS — E041 Nontoxic single thyroid nodule: Secondary | ICD-10-CM | POA: Diagnosis not present

## 2017-06-12 ENCOUNTER — Other Ambulatory Visit: Payer: Self-pay | Admitting: Family Medicine

## 2017-06-16 ENCOUNTER — Encounter (HOSPITAL_COMMUNITY): Payer: Self-pay | Admitting: Family Medicine

## 2017-06-16 ENCOUNTER — Ambulatory Visit (HOSPITAL_COMMUNITY)
Admission: EM | Admit: 2017-06-16 | Discharge: 2017-06-16 | Disposition: A | Discharge status: Home / Self Care | Payer: 59 | Attending: Family Medicine | Admitting: Family Medicine

## 2017-06-16 DIAGNOSIS — S1086XA Insect bite of other specified part of neck, initial encounter: Secondary | ICD-10-CM | POA: Diagnosis not present

## 2017-06-16 DIAGNOSIS — W57XXXA Bitten or stung by nonvenomous insect and other nonvenomous arthropods, initial encounter: Secondary | ICD-10-CM

## 2017-06-16 DIAGNOSIS — J3089 Other allergic rhinitis: Secondary | ICD-10-CM

## 2017-06-16 DIAGNOSIS — J029 Acute pharyngitis, unspecified: Secondary | ICD-10-CM | POA: Diagnosis not present

## 2017-06-16 DIAGNOSIS — J309 Allergic rhinitis, unspecified: Secondary | ICD-10-CM

## 2017-06-16 MED ORDER — OLOPATADINE HCL 0.2 % OP SOLN: 1.0000 [drp] | Freq: Every day | OPHTHALMIC | 0 refills | Status: DC

## 2017-06-16 MED ORDER — IPRATROPIUM BROMIDE 0.06 % NA SOLN: 2.0000 | Freq: Four times a day (QID) | NASAL | 0 refills | Status: DC

## 2017-06-16 MED ORDER — CETIRIZINE HCL 10 MG PO TABS: 10.0000 mg | ORAL_TABLET | Freq: Every day | ORAL | 0 refills | Status: DC

## 2017-06-16 MED ORDER — FLUTICASONE PROPIONATE 50 MCG/ACT NA SUSP: 2.0000 | Freq: Every day | NASAL | 0 refills | Status: DC

## 2017-06-16 NOTE — Discharge Instructions (Signed)
Start flonase, atrovent nasal spray, zyrtec for nasal congestion/drainage. You can use over the counter nasal saline rinse such as neti pot for nasal congestion. Keep hydrated, your urine should be clear to pale yellow in color. Tylenol/motrin for fever and pain. Monitor for any worsening of symptoms, chest pain, shortness of breath, wheezing, swelling of the throat, follow up for reevaluation.   Lid scrubs and warm compresses discussed.  If still having watery eyes/itchy eyes, can start [place] as directed.  Insect bite does not look infected today.  Warm compress as directed.  Zyrtec will help with itchiness.  Monitor for any spreading redness, increased warmth, fever, pain, follow-up for reevaluation.

## 2017-06-16 NOTE — ED Triage Notes (Signed)
Pt here for possible spider bite to left neck area. Report this occurred this weekend. She also started having a sore throat that started today.

## 2017-06-16 NOTE — ED Provider Notes (Signed)
[facility]    CSN: [id number] Arrival date & time: 06/16/17  1415     History   Chief Complaint Chief Complaint  Patient presents with  . Insect Bite  . Sore Throat    HPI Victoria Padilla is a 52 y.o. female.   51 year old female comes in for 2-day history of insect bite and 1 day history of sore throat.  States felt the insect bite yesterday, with swelling.  Area is itching and slightly painful.  States swelling has kept around the same size.  Has been applying alcohol wipes without relief.  Given insect bite is at the back of the neck, has not been able to monitor for redness, but has not had any increased warmth or fever.   1 day history of sore throat.  Has had rhinorrhea, nasal congestion for the past few months.  Intermittent cough.  Nausea without vomiting.  She endorses some itching/watering eyes in the morning.  Has not taken anything for the symptoms.  Denies fever, chills, night sweats.  Never smoker.     Past Medical History:  Diagnosis Date  . Acid reflux   . Anemia   . Arthritis   . Cervical dysplasia   . Diabetes mellitus   . Heart murmur   . Hyperlipidemia   . Hypertension   . LBP (low back pain)   . Obesity   . Sarcoidosis   . Vitamin D deficiency     Patient Active Problem List   Diagnosis Date Noted  . Right thyroid nodule 05/27/2017  . GERD (gastroesophageal reflux disease) 12/21/2015  . Personal history of noncompliance with medical treatment, presenting hazards to health 12/21/2015  . Morbid obesity ([place]) 03/03/2015  . History of anemia 03/03/2015  . Hyperlipidemia associated with type 2 diabetes mellitus ([place]) 03/03/2015  . Hypertension associated with diabetes ([place]) 03/03/2015  . 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 Uncontrolled type 2 diabetes mellitus with complication ([place])   . Arthritis   . Cervical dysplasia   . Sarcoidosis     Past Surgical History:  Procedure Laterality Date  .  BUNIONECTOMY    . CHOLECYSTECTOMY    . COLPOSCOPY    . GYNECOLOGIC CRYOSURGERY    . KNEE ARTHROSCOPY    . TUBAL LIGATION      OB History    Gravida  3   Para  3   Term  3   Preterm      AB      Living  3     SAB      TAB      Ectopic      Multiple      Live Births               Home Medications    Prior to Admission medications   Medication Sig Start Date End Date Taking? Authorizing Provider  albuterol (PROVENTIL HFA;VENTOLIN HFA) 108 (90 Base) MCG/ACT inhaler Inhale 2 puffs into the lungs every 6 (six) hours as needed for wheezing or shortness of breath. 02/08/16   Henson, [name] L, NP-C  aspirin 81 MG tablet Take 1 tablet (81 mg total) by mouth daily. Reported on 08/08/2015 08/09/15   Tysinger, Camelia Eng, PA-C  benazepril (LOTENSIN) 40 MG tablet Take 1 tablet (40 mg total) by mouth daily. 04/01/17   Denita Lung, MD  cetirizine (ZYRTEC) 10 MG tablet Take 1 tablet (10 mg total) by mouth daily. 06/16/17   Tasia Catchings,  Rynlee Lisbon V, PA-C  cholecalciferol (VITAMIN D) 1000 units tablet Take 2,000 Units by mouth daily.    [provider]  FARXIGA 5 MG TABS tablet TAKE 1 TABLET BY MOUTH EVERY DAY 06/12/17   Denita Lung, MD  fluticasone [facility]) 50 MCG/ACT nasal spray Place 2 sprays into both nostrils daily. 06/16/17   Tasia Catchings, Korin Hartwell V, PA-C  glipiZIDE (GLUCOTROL) 5 MG tablet Take 1 tablet (5 mg total) by mouth every morning. 05/27/17   Renato Shin, MD  hydrochlorothiazide (HYDRODIURIL) 25 MG tablet Take 1 tablet (25 mg total) by mouth daily. 05/02/17   Henson, [name] L, NP-C  ipratropium (ATROVENT) 0.06 % nasal spray Place 2 sprays into both nostrils 4 (four) times daily. 06/16/17   Ok Edwards, PA-C  metFORMIN (GLUCOPHAGE-XR) 500 MG 24 hr tablet Take 4 tablets (2,000 mg total) by mouth daily with breakfast. 05/27/17   Renato Shin, MD  Multiple Vitamins-Minerals (MULTIVITAMIN PO) Take 2 capsules by mouth 2 (two) times daily. Reported on 03/10/2015 06/11/13   [provider]    Olopatadine HCl 0.2 % SOLN Apply 1 drop to eye daily. 06/16/17   Tasia Catchings, Alexandar Weisenberger V, PA-C  Omega-3 Fatty Acids (FISH OIL PO) Take 1 capsule by mouth daily. Reported on 08/08/2015    [provider]  Jonetta Speak LANCETS 25K MISC Test twice a day. Pt uses a one touch verio flex meter 12/05/16   Henson, [name] L, NP-C  ONETOUCH VERIO test strip TEST TWICE A DAY. PT USES A ONE TOUCH VERIO FLEX METER 04/04/17   Henson, [name] L, NP-C  simvastatin (ZOCOR) 20 MG tablet TAKE 1 TABLET BY MOUTH EVERYDAY AT BEDTIME 05/28/17   Henson, [name] L, NP-C  sitaGLIPtin (JANUVIA) 100 MG tablet Take 1 tablet (100 mg total) by mouth daily. 05/27/17   Renato Shin, MD    Family History Family History  Problem Relation Age of Onset  . Heart failure Mother 27       chf  . Heart failure Father   . Cancer Brother 60       prostate  . Hyperlipidemia Brother   . Hypertension Brother   . Diabetes Brother   . Hypertension Maternal Uncle   . Hyperlipidemia Brother   . Diabetes Brother   . Colon cancer Maternal Grandfather   . Colon polyps Brother   . Heart disease Brother   . Stomach cancer Neg Hx   . Esophageal cancer Neg Hx   . Rectal cancer Neg Hx   . Liver cancer Neg Hx     Social History Social History   Tobacco Use  . Smoking status: Never Smoker  . Smokeless tobacco: Never Used  Substance Use Topics  . Alcohol use: No  . Drug use: No     Allergies   Naprosyn [naproxen]   Review of Systems Review of Systems  Reason unable to perform ROS: See HPI as above.     Physical Exam Triage Vital Signs ED Triage Vitals [06/16/17 1502]  Enc Vitals Group     BP (!) 143/81     Pulse Rate 96     Resp 18     Temp 98.2 F (36.8 C)     Temp src      SpO2 100 %     Weight      Height      Head Circumference      Peak Flow      Pain Score 5     Pain Loc  Pain Edu?      Excl. in [place]?    No data found.  Updated Vital Signs BP (!) 143/81   Pulse 96   Temp 98.2 F (36.8 C)   Resp 18    SpO2 100%   Physical Exam  Constitutional: She is oriented to person, place, and time. She appears well-developed and well-nourished. No distress.  HENT:  Head: Normocephalic and atraumatic.  Right Ear: Tympanic membrane, external ear and ear canal normal. Tympanic membrane is not erythematous and not bulging.  Left Ear: Tympanic membrane, external ear and ear canal normal. Tympanic membrane is not erythematous and not bulging.  Nose: Mucosal edema and rhinorrhea present. Right sinus exhibits no maxillary sinus tenderness and no frontal sinus tenderness. Left sinus exhibits no maxillary sinus tenderness and no frontal sinus tenderness.  Mouth/Throat: Uvula is midline, oropharynx is clear and moist and mucous membranes are normal.  Eyes: Pupils are equal, round, and reactive to light. Conjunctivae, EOM and lids are normal.  Neck: Normal range of motion. Neck supple.  Cardiovascular: Normal rate, regular rhythm and normal heart sounds. Exam reveals no gallop and no friction rub.  No murmur heard. Pulmonary/Chest: Effort normal and breath sounds normal. She has no decreased breath sounds. She has no wheezes. She has no rhonchi. She has no rales.  Lymphadenopathy:    She has no cervical adenopathy.  Neurological: She is alert and oriented to person, place, and time.  Skin: Skin is warm and dry.  Small swelling at the back of the neck.  No erythema, increased warmth.  No tenderness to palpation.  No fluctuance felt.  Psychiatric: She has a normal mood and affect. Her behavior is normal. Judgment normal.     UC Treatments / Results  Labs (all labs ordered are listed, but only abnormal results are displayed) Labs Reviewed - No data to display  EKG None Radiology No results found.  Procedures Procedures (including critical care time)  Medications Ordered in UC Medications - No data to display   Initial Impression / Assessment and Plan / UC Course  I have reviewed the triage vital  signs and the nursing notes.  Pertinent labs & imaging results that were available during my care of the patient were reviewed by me and considered in my medical decision making (see chart for details).    Discussed with patient history and exam most consistent with allergic rhinitis vs viral URI. Symptomatic treatment as needed. Push fluids. Return precautions given.   No signs of cellulitis of the insect bite.  Will have patient take antihistamine to help with itchiness.  Warm compress.  Return precautions given.  Patient expresses understanding and agrees to plan.   Final Clinical Impressions(s) / UC Diagnoses   Final diagnoses:  Insect bite of other part of neck, initial encounter  Allergic rhinitis, unspecified seasonality, unspecified trigger    ED Discharge Orders        Ordered    cetirizine (ZYRTEC) 10 MG tablet  Daily     06/16/17 1542    fluticasone (FLONASE) 50 MCG/ACT nasal spray  Daily     06/16/17 1542    ipratropium (ATROVENT) 0.06 % nasal spray  4 times daily     06/16/17 1542    Olopatadine HCl 0.2 % SOLN  Daily     06/16/17 1542       Ok Edwards, PA-C 06/16/17 1548

## 2017-07-09 ENCOUNTER — Encounter: Payer: Self-pay | Admitting: Endocrinology

## 2017-07-09 ENCOUNTER — Ambulatory Visit: Payer: 59 | Admitting: Endocrinology

## 2017-07-09 VITALS — BP 128/80 | HR 94 | Ht 63.0 in | Wt 254.0 lb

## 2017-07-09 DIAGNOSIS — E1165 Type 2 diabetes mellitus with hyperglycemia: Secondary | ICD-10-CM | POA: Diagnosis not present

## 2017-07-09 DIAGNOSIS — IMO0002 Reserved for concepts with insufficient information to code with codable children: Secondary | ICD-10-CM

## 2017-07-09 DIAGNOSIS — E118 Type 2 diabetes mellitus with unspecified complications: Secondary | ICD-10-CM

## 2017-07-09 LAB — POCT GLYCOSYLATED HEMOGLOBIN (HGB A1C): HEMOGLOBIN A1C: 10

## 2017-07-09 MED ORDER — BASAGLAR KWIKPEN 100 UNIT/ML ~~LOC~~ SOPN: 10.0000 [IU] | PEN_INJECTOR | SUBCUTANEOUS | 11 refills | Status: DC

## 2017-07-09 NOTE — Progress Notes (Signed)
Subjective:    Patient ID: Victoria Padilla, female    DOB: [birth year], 52 y.o.   MRN: [id number]  HPI Pt returns for f/u of diabetes mellitus: DM type: 2 Dx'ed: 0973 Complications: DR Therapy: 3 oral meds.   GDM: 1998, but DM persisted after the pregnancy DKA: never Severe hypoglycemia: never Pancreatitis: never Pancreatic imaging: normal on 2017 [place] Other: she has never been on insulin, but she took trulicity for a few months in 2016; she has thyroid pseudonodule Interval history: pt states she feels well in general.  She takes med as rx'ed  Past Medical History:  Diagnosis Date  . Acid reflux   . Anemia   . Arthritis   . Cervical dysplasia   . Diabetes mellitus   . Heart murmur   . Hyperlipidemia   . Hypertension   . LBP (low back pain)   . Obesity   . Sarcoidosis   . Vitamin D deficiency     Past Surgical History:  Procedure Laterality Date  . BUNIONECTOMY    . CHOLECYSTECTOMY    . COLPOSCOPY    . GYNECOLOGIC CRYOSURGERY    . KNEE ARTHROSCOPY    . TUBAL LIGATION      Social History   Socioeconomic History  . Marital status: Single    Spouse name: Not on file  . Number of children: 3  . Years of education: Not on file  . Highest education level: Not on file  Occupational History    Employer: [employer]  . Financial resource strain: Not on file  . Food insecurity:    Worry: Not on file    Inability: Not on file  . Transportation needs:    Medical: Not on file    Non-medical: Not on file  Tobacco Use  . Smoking status: Never Smoker  . Smokeless tobacco: Never Used  Substance and Sexual Activity  . Alcohol use: No  . Drug use: No  . Sexual activity: Never    Birth control/protection: Surgical  Lifestyle  . Physical activity:    Days per week: Not on file    Minutes per session: Not on file  . Stress: Not on file  Relationships  . Social connections:    Talks on phone: Not on file    Gets together: Not on file    Attends  religious service: Not on file    Active member of club or organization: Not on file    Attends meetings of clubs or organizations: Not on file    Relationship status: Not on file  . Intimate partner violence:    Fear of current or ex partner: Not on file    Emotionally abused: Not on file    Physically abused: Not on file    Forced sexual activity: Not on file  Other Topics Concern  . Not on file  Social History Narrative  . Not on file    Current Outpatient Medications on File Prior to Visit  Medication Sig Dispense Refill  . albuterol (PROVENTIL HFA;VENTOLIN HFA) 108 (90 Base) MCG/ACT inhaler Inhale 2 puffs into the lungs every 6 (six) hours as needed for wheezing or shortness of breath. 1 Inhaler 0  . aspirin 81 MG tablet Take 1 tablet (81 mg total) by mouth daily. Reported on 08/08/2015 90 tablet 3  . benazepril (LOTENSIN) 40 MG tablet Take 1 tablet (40 mg total) by mouth daily. 90 tablet 1  . cetirizine (ZYRTEC) 10 MG tablet Take  1 tablet (10 mg total) by mouth daily. 15 tablet 0  . cholecalciferol (VITAMIN D) 1000 units tablet Take 2,000 Units by mouth daily.    Marland Kitchen FARXIGA 5 MG TABS tablet TAKE 1 TABLET BY MOUTH EVERY DAY 30 tablet 2  . fluticasone (FLONASE) 50 MCG/ACT nasal spray Place 2 sprays into both nostrils daily. 1 g 0  . glipiZIDE (GLUCOTROL) 5 MG tablet Take 1 tablet (5 mg total) by mouth every morning. 90 tablet 3  . hydrochlorothiazide (HYDRODIURIL) 25 MG tablet Take 1 tablet (25 mg total) by mouth daily. 30 tablet 2  . ipratropium (ATROVENT) 0.06 % nasal spray Place 2 sprays into both nostrils 4 (four) times daily. 15 mL 0  . metFORMIN (GLUCOPHAGE-XR) 500 MG 24 hr tablet Take 4 tablets (2,000 mg total) by mouth daily with breakfast. 360 tablet 3  . Multiple Vitamins-Minerals (MULTIVITAMIN PO) Take 2 capsules by mouth 2 (two) times daily. Reported on 03/10/2015    . Olopatadine HCl 0.2 % SOLN Apply 1 drop to eye daily. 2.5 mL 0  . Omega-3 Fatty Acids (FISH OIL PO) Take 1  capsule by mouth daily. Reported on 08/08/2015    . ONETOUCH DELICA LANCETS 35T MISC Test twice a day. Pt uses a one touch verio flex meter 100 each 5  . ONETOUCH VERIO test strip TEST TWICE A DAY. PT USES A ONE TOUCH VERIO FLEX METER 100 each 1  . simvastatin (ZOCOR) 20 MG tablet TAKE 1 TABLET BY MOUTH EVERYDAY AT BEDTIME 90 tablet 1  . sitaGLIPtin (JANUVIA) 100 MG tablet Take 1 tablet (100 mg total) by mouth daily. 90 tablet 3   Current Facility-Administered Medications on File Prior to Visit  Medication Dose Route Frequency Provider Last Rate Last Dose  . 0.9 %  sodium chloride infusion  500 mL Intravenous Continuous Armbruster, Carlota Raspberry, MD        Allergies  Allergen Reactions  . Naprosyn [Naproxen] Nausea Only    Family History  Problem Relation Age of Onset  . Heart failure Mother 36       chf  . Heart failure Father   . Cancer Brother 12       prostate  . Hyperlipidemia Brother   . Hypertension Brother   . Diabetes Brother   . Hypertension Maternal Uncle   . Hyperlipidemia Brother   . Diabetes Brother   . Colon cancer Maternal Grandfather   . Colon polyps Brother   . Heart disease Brother   . Stomach cancer Neg Hx   . Esophageal cancer Neg Hx   . Rectal cancer Neg Hx   . Liver cancer Neg Hx     BP 128/80   Pulse 94   Ht 5\' 3"  (1.6 m)   Wt 254 lb (115.2 kg)   SpO2 95%   BMI 44.99 kg/m    Review of Systems She denies hypoglycemia.  She has lost 7 lbs.      Objective:   Physical Exam VITAL SIGNS:  See vs page GENERAL: no distress Pulses: dorsalis pedis intact bilat.   MSK: no deformity of the feet CV: 1+ bilat leg edema Skin:  no ulcer on the feet.  normal color and temp on the feet.  Old healed surgical scars on bith great toe MTP areas Neuro: sensation is intact to touch on the feet Ext: There is bilateral onychomycosis of the toenails   Lab Results  Component Value Date   HGBA1C 10.0 07/09/2017   Lab Results  Component Value Date   CREATININE  0.60 05/08/2017   BUN 10 05/08/2017   NA 142 05/08/2017   K 4.7 05/08/2017   CL 103 05/08/2017   CO2 23 05/08/2017      Assessment & Plan:  type 2 DM: worse.  We discussed.  She agrees to take insulin, but she declines multiple daily injections Weight loss: prob due to hyperglycemia.  We'll follow this  Patient Instructions  check your blood sugar twice a day.  vary the time of day when you check, between before the 3 meals, and at bedtime.  also check if you have symptoms of your blood sugar being too high or too low.  please keep a record of the readings and bring it to your next appointment here (or you can bring the meter itself).  You can write it on any piece of paper.  please call us sooner if your blood sugar goes below 70, or if you have a lot of readings over 200. I have sent a prescription to your pharmacy, to add "Basaglar," 10 units each morning. Please continue the same diabetes pills for now, but we'll stop these with time. Please call or message [place] next week, to tell us how the blood sugar is doing Out goal is for the blood sugar to be in the low-100's. Please come back for a follow-up appointment in 2 months.

## 2017-07-09 NOTE — Patient Instructions (Signed)
check your blood sugar twice a day.  vary the time of day when you check, between before the 3 meals, and at bedtime.  also check if you have symptoms of your blood sugar being too high or too low.  please keep a record of the readings and bring it to your next appointment here (or you can bring the meter itself).  You can write it on any piece of paper.  please call us sooner if your blood sugar goes below 70, or if you have a lot of readings over 200. I have sent a prescription to your pharmacy, to add "Basaglar," 10 units each morning. Please continue the same diabetes pills for now, but we'll stop these with time. Please call or message [place] next week, to tell us how the blood sugar is doing Out goal is for the blood sugar to be in the low-100's. Please come back for a follow-up appointment in 2 months.

## 2017-07-18 ENCOUNTER — Telehealth: Payer: Self-pay | Admitting: Endocrinology

## 2017-07-18 NOTE — Telephone Encounter (Signed)
Patient is calling to give b/s readings

## 2017-07-18 NOTE — Telephone Encounter (Signed)
Patient ask you to call her at her work  number P # [phone]

## 2017-07-18 NOTE — Telephone Encounter (Signed)
LVM for patient to call back & give BS readings.

## 2017-07-18 NOTE — Telephone Encounter (Signed)
Thanks for calling.  This helps. D/c glipizide. Increase basaglar to 15 units qam Please call or message [place] next week, to tell us how the blood sugar is doing

## 2017-07-18 NOTE — Telephone Encounter (Signed)
I notified patient, but she wanted to know if she could d/c faxiga instead of glipizide?

## 2017-07-18 NOTE — Telephone Encounter (Signed)
ok 

## 2017-07-18 NOTE — Telephone Encounter (Signed)
Patient is giving blood sugar readings:  All Fasting:  5/15 177 5/16 166 5/17 116 5/21 111 5/23 136 5/24 95

## 2017-07-22 NOTE — Telephone Encounter (Signed)
LVM stating that she could d/c the faxiga instead of the glipizide. I asked that she call back to confirm that she received message.

## 2017-07-31 ENCOUNTER — Other Ambulatory Visit: Payer: Self-pay | Admitting: Family Medicine

## 2017-07-31 DIAGNOSIS — I10 Essential (primary) hypertension: Principal | ICD-10-CM

## 2017-07-31 DIAGNOSIS — E1159 Type 2 diabetes mellitus with other circulatory complications: Secondary | ICD-10-CM

## 2017-07-31 DIAGNOSIS — I152 Hypertension secondary to endocrine disorders: Secondary | ICD-10-CM

## 2017-09-17 ENCOUNTER — Ambulatory Visit: Payer: 59 | Admitting: Endocrinology

## 2017-09-17 DIAGNOSIS — Z0289 Encounter for other administrative examinations: Secondary | ICD-10-CM

## 2017-09-18 ENCOUNTER — Other Ambulatory Visit: Payer: Self-pay | Admitting: Endocrinology

## 2017-10-26 ENCOUNTER — Other Ambulatory Visit: Payer: Self-pay | Admitting: Family Medicine

## 2017-10-26 DIAGNOSIS — I10 Essential (primary) hypertension: Secondary | ICD-10-CM

## 2017-10-28 NOTE — Telephone Encounter (Signed)
Left message for pt to call back and schedule an appt for follow-up on HTN

## 2017-11-09 ENCOUNTER — Other Ambulatory Visit: Payer: Self-pay | Admitting: Endocrinology

## 2017-11-24 DIAGNOSIS — M2560 Stiffness of unspecified joint, not elsewhere classified: Secondary | ICD-10-CM | POA: Diagnosis not present

## 2017-11-24 DIAGNOSIS — M545 Low back pain: Secondary | ICD-10-CM | POA: Diagnosis not present

## 2017-11-24 DIAGNOSIS — R293 Abnormal posture: Secondary | ICD-10-CM | POA: Diagnosis not present

## 2017-11-27 ENCOUNTER — Other Ambulatory Visit: Payer: Self-pay | Admitting: Family Medicine

## 2018-01-08 ENCOUNTER — Encounter: Payer: Self-pay | Admitting: Endocrinology

## 2018-01-08 ENCOUNTER — Ambulatory Visit (INDEPENDENT_AMBULATORY_CARE_PROVIDER_SITE_OTHER): Payer: 59 | Admitting: Endocrinology

## 2018-01-08 VITALS — BP 146/88 | HR 83 | Ht 63.0 in | Wt 262.6 lb

## 2018-01-08 DIAGNOSIS — E118 Type 2 diabetes mellitus with unspecified complications: Secondary | ICD-10-CM | POA: Diagnosis not present

## 2018-01-08 DIAGNOSIS — IMO0002 Reserved for concepts with insufficient information to code with codable children: Secondary | ICD-10-CM

## 2018-01-08 DIAGNOSIS — E1165 Type 2 diabetes mellitus with hyperglycemia: Secondary | ICD-10-CM

## 2018-01-08 LAB — POCT GLYCOSYLATED HEMOGLOBIN (HGB A1C): Hemoglobin A1C: 8.4 % — AB (ref 4.0–5.6)

## 2018-01-08 MED ORDER — GLUCOSE BLOOD VI STRP: 1.0000 | ORAL_STRIP | Freq: Two times a day (BID) | 3 refills | Status: DC

## 2018-01-08 MED ORDER — BASAGLAR KWIKPEN 100 UNIT/ML ~~LOC~~ SOPN: 20.0000 [IU] | PEN_INJECTOR | SUBCUTANEOUS | 3 refills | Status: DC

## 2018-01-08 NOTE — Patient Instructions (Addendum)
check your blood sugar twice a day.  vary the time of day when you check, between before the 3 meals, and at bedtime.  also check if you have symptoms of your blood sugar being too high or too low.  please keep a record of the readings and bring it to your next appointment here (or you can bring the meter itself).  You can write it on any piece of paper.  please call us sooner if your blood sugar goes below 70, or if you have a lot of readings over 200. I have sent a prescription to your pharmacy, to increase the Basaglar to 20 units each morning, and:  please stop taking the glipizide pill.  Please call or message [place] next week, to tell us how the blood sugar is doing.   Our goal is for the blood sugar to be in the low-100's. Please come back for a follow-up appointment in 2 months.

## 2018-01-08 NOTE — Progress Notes (Signed)
Subjective:    Patient ID: Victoria Padilla, female    DOB: 1965-03-09, 52 y.o.   MRN: [id number]  HPI Pt returns for f/u of diabetes mellitus: DM type: Insulin-requiring type 2 Dx'ed: 2831 Complications: DR Therapy: insulin since 2019, and 3 oral meds.   GDM: 1998, but DM persisted after the pregnancy DKA: never Severe hypoglycemia: never Pancreatitis: never Pancreatic imaging: normal on 2017 [place] Other: she declined multiple daily injections.  she took trulicity for a few months in 2016; she has thyroid pseudonodule.   Interval history: pt states she feels well in general.  She takes med as rx'ed.  She takes 10-15 units qd.  She does not check cbg's.   Past Medical History:  Diagnosis Date  . Acid reflux   . Anemia   . Arthritis   . Cervical dysplasia   . Diabetes mellitus   . Heart murmur   . Hyperlipidemia   . Hypertension   . LBP (low back pain)   . Obesity   . Sarcoidosis   . Vitamin D deficiency     Past Surgical History:  Procedure Laterality Date  . BUNIONECTOMY    . CHOLECYSTECTOMY    . COLPOSCOPY    . GYNECOLOGIC CRYOSURGERY    . KNEE ARTHROSCOPY    . TUBAL LIGATION      Social History   Socioeconomic History  . Marital status: Single    Spouse name: Not on file  . Number of children: 3  . Years of education: Not on file  . Highest education level: Not on file  Occupational History    Employer: [employer]  . Financial resource strain: Not on file  . Food insecurity:    Worry: Not on file    Inability: Not on file  . Transportation needs:    Medical: Not on file    Non-medical: Not on file  Tobacco Use  . Smoking status: Never Smoker  . Smokeless tobacco: Never Used  Substance and Sexual Activity  . Alcohol use: No  . Drug use: No  . Sexual activity: Never    Birth control/protection: Surgical  Lifestyle  . Physical activity:    Days per week: Not on file    Minutes per session: Not on file  . Stress: Not on file    Relationships  . Social connections:    Talks on phone: Not on file    Gets together: Not on file    Attends religious service: Not on file    Active member of club or organization: Not on file    Attends meetings of clubs or organizations: Not on file    Relationship status: Not on file  . Intimate partner violence:    Fear of current or ex partner: Not on file    Emotionally abused: Not on file    Physically abused: Not on file    Forced sexual activity: Not on file  Other Topics Concern  . Not on file  Social History Narrative  . Not on file    Current Outpatient Medications on File Prior to Visit  Medication Sig Dispense Refill  . albuterol (PROVENTIL HFA;VENTOLIN HFA) 108 (90 Base) MCG/ACT inhaler Inhale 2 puffs into the lungs every 6 (six) hours as needed for wheezing or shortness of breath. 1 Inhaler 0  . aspirin 81 MG tablet Take 1 tablet (81 mg total) by mouth daily. Reported on 08/08/2015 90 tablet 3  . benazepril (LOTENSIN) 40 MG  tablet TAKE 1 TABLET BY MOUTH EVERY DAY 90 tablet 0  . cetirizine (ZYRTEC) 10 MG tablet Take 1 tablet (10 mg total) by mouth daily. 15 tablet 0  . cholecalciferol (VITAMIN D) 1000 units tablet Take 2,000 Units by mouth daily.    Marland Kitchen FARXIGA 5 MG TABS tablet TAKE 1 TABLET BY MOUTH EVERY DAY 30 tablet 2  . fluticasone (FLONASE) 50 MCG/ACT nasal spray Place 2 sprays into both nostrils daily. 1 g 0  . hydrochlorothiazide (HYDRODIURIL) 25 MG tablet TAKE 1 TABLET BY MOUTH EVERY DAY 30 tablet 0  . ipratropium (ATROVENT) 0.06 % nasal spray Place 2 sprays into both nostrils 4 (four) times daily. 15 mL 0  . metFORMIN (GLUCOPHAGE-XR) 500 MG 24 hr tablet Take 4 tablets (2,000 mg total) by mouth daily with breakfast. 360 tablet 3  . Multiple Vitamins-Minerals (MULTIVITAMIN PO) Take 2 capsules by mouth 2 (two) times daily. Reported on 03/10/2015    . Olopatadine HCl 0.2 % SOLN Apply 1 drop to eye daily. 2.5 mL 0  . Omega-3 Fatty Acids (FISH OIL PO) Take 1 capsule  by mouth daily. Reported on 08/08/2015    . ONETOUCH DELICA LANCETS 01U MISC Test twice a day. Pt uses a one touch verio flex meter 100 each 5  . simvastatin (ZOCOR) 20 MG tablet TAKE 1 TABLET BY MOUTH EVERYDAY AT BEDTIME 90 tablet 1   Current Facility-Administered Medications on File Prior to Visit  Medication Dose Route Frequency Provider Last Rate Last Dose  . 0.9 %  sodium chloride infusion  500 mL Intravenous Continuous Armbruster, Carlota Raspberry, MD        Allergies  Allergen Reactions  . Naprosyn [Naproxen] Nausea Only    Family History  Problem Relation Age of Onset  . Heart failure Mother 80       chf  . Heart failure Father   . Cancer Brother 71       prostate  . Hyperlipidemia Brother   . Hypertension Brother   . Diabetes Brother   . Hypertension Maternal Uncle   . Hyperlipidemia Brother   . Diabetes Brother   . Colon cancer Maternal Grandfather   . Colon polyps Brother   . Heart disease Brother   . Stomach cancer Neg Hx   . Esophageal cancer Neg Hx   . Rectal cancer Neg Hx   . Liver cancer Neg Hx     BP (!) 146/88 (BP Location: Left Arm, Patient Position: Sitting, Cuff Size: Large)   Pulse 83   Ht 5\' 3"  (1.6 m)   Wt 262 lb 9.6 oz (119.1 kg)   SpO2 94%   BMI 46.52 kg/m    Review of Systems She denies hypoglycemia.      Objective:   Physical Exam VITAL SIGNS:  See vs page GENERAL: no distress Pulses: dorsalis pedis intact bilat.   MSK: no deformity of the feet CV: 1+ bilat leg edema Skin:  no ulcer on the feet.  normal color and temp on the feet.  Old healed surgical scars on bith great toe MTP areas Neuro: sensation is intact to touch on the feet Ext: There is bilateral onychomycosis of the toenails  A1c=8.4% Lab Results  Component Value Date   CREATININE 0.60 05/08/2017   BUN 10 05/08/2017   NA 142 05/08/2017   K 4.7 05/08/2017   CL 103 05/08/2017   CO2 23 05/08/2017       Assessment & Plan:  Insulin-requiring type 2 DM: she needs  increased  rx.  Patient Instructions  check your blood sugar twice a day.  vary the time of day when you check, between before the 3 meals, and at bedtime.  also check if you have symptoms of your blood sugar being too high or too low.  please keep a record of the readings and bring it to your next appointment here (or you can bring the meter itself).  You can write it on any piece of paper.  please call us sooner if your blood sugar goes below 70, or if you have a lot of readings over 200. I have sent a prescription to your pharmacy, to increase the Basaglar to 20 units each morning, and:  please stop taking the glipizide pill.  Please call or message [place] next week, to tell us how the blood sugar is doing.   Our goal is for the blood sugar to be in the low-100's. Please come back for a follow-up appointment in 2 months.

## 2018-01-20 ENCOUNTER — Other Ambulatory Visit: Payer: Self-pay | Admitting: Family Medicine

## 2018-01-20 DIAGNOSIS — E1159 Type 2 diabetes mellitus with other circulatory complications: Secondary | ICD-10-CM

## 2018-01-20 DIAGNOSIS — I10 Essential (primary) hypertension: Principal | ICD-10-CM

## 2018-01-20 DIAGNOSIS — I152 Hypertension secondary to endocrine disorders: Secondary | ICD-10-CM

## 2018-01-27 ENCOUNTER — Ambulatory Visit: Payer: 59 | Admitting: Family Medicine

## 2018-01-27 ENCOUNTER — Encounter: Payer: Self-pay | Admitting: Family Medicine

## 2018-01-27 VITALS — BP 160/100 | HR 84 | Temp 98.3°F | Resp 16 | Wt 258.2 lb

## 2018-01-27 DIAGNOSIS — R05 Cough: Secondary | ICD-10-CM | POA: Diagnosis not present

## 2018-01-27 DIAGNOSIS — R058 Other specified cough: Secondary | ICD-10-CM

## 2018-01-27 DIAGNOSIS — E1159 Type 2 diabetes mellitus with other circulatory complications: Secondary | ICD-10-CM | POA: Diagnosis not present

## 2018-01-27 DIAGNOSIS — I152 Hypertension secondary to endocrine disorders: Secondary | ICD-10-CM

## 2018-01-27 DIAGNOSIS — I10 Essential (primary) hypertension: Secondary | ICD-10-CM | POA: Diagnosis not present

## 2018-01-27 MED ORDER — AZITHROMYCIN 250 MG PO TABS: ORAL_TABLET | ORAL | 0 refills | Status: DC

## 2018-01-27 MED ORDER — BENZONATATE 200 MG PO CAPS: 200.0000 mg | ORAL_CAPSULE | Freq: Two times a day (BID) | ORAL | 0 refills | Status: DC | PRN

## 2018-01-27 NOTE — Progress Notes (Signed)
Chief Complaint  Patient presents with  . cold    symtpoms- congestion, chest congestion, coughing up mucous, lots of mucous, headache off and on. nauseated     Subjective:  Victoria Padilla is a 51 y.o. female who presents for a 4 week history of post nasal drainage, and intermittent productive cough with congestion. States she is worsening and now has sinus pressure, nasal congestion.  Denies fever, chills, ear pain, sore throat, chest pain, palpitations, shortness of breath, wheezing, abdominal pain, vomiting, diarrhea.  Treatment to date: antihistamines, cough suppressants and decongestants.  Denies sick contacts.  No other aggravating or relieving factors.    States her BP is elevated due to not taking her medication today and taking multiple cold medications.  Reports good daily compliance with HCTZ and benazapril.   BP at home has been <130/90  ROS as in subjective.   Objective: Vitals:   01/27/18 1153  BP: (!) 160/100  Pulse: 84  Resp: 16  Temp: 98.3 F (36.8 C)  SpO2: 98%    General appearance: Alert, WD/WN, no distress, mildly ill appearing                             Skin: warm, no rash                           Head: no sinus tenderness                            Eyes: conjunctiva normal, corneas clear, PERRLA                            Ears: pearly TMs, external ear canals normal                          Nose: septum midline, turbinates swollen, with erythema and clear discharge             Mouth/throat: MMM, tongue normal, mild pharyngeal erythema                           Neck: supple, no adenopathy, no thyromegaly, nontender                          Heart: RRR, normal S1, S2, no murmurs                         Lungs: CTA bilaterally, no wheezes, rales, or rhonchi      Assessment: Productive cough - Plan: azithromycin (ZITHROMAX Z-PAK) 250 MG tablet, benzonatate (TESSALON) 200 MG capsule  Cough present for greater than 3 weeks - Plan: azithromycin  (ZITHROMAX Z-PAK) 250 MG tablet, benzonatate (TESSALON) 200 MG capsule  Hypertension associated with diabetes ([place])   Plan: Discussed diagnosis and treatment of productive cough and cough >3 weeks. Z-pak and Tessalon prescribed. Suggested symptomatic OTC remedies such as Coricidin. Nasal saline spray for congestion.  Tylenol or Ibuprofen OTC for fever and malaise.  Call/return if worsening. Discussed with her blood pressure significantly elevated today.  She admits to not taking her medication and suspect cold medications are also affecting her blood pressure.  Return to see me in 4 weeks for HTN.

## 2018-01-27 NOTE — Patient Instructions (Addendum)
Take the antibiotic as prescribed.  Stay well-hydrated.  You can take the Tessalon for cough as well.  This is not sedating.  You may also take over-the-counter Coricidin for high blood pressure for your cough.  Call if your symptoms are getting worse in spite of the antibiotic and we will send you for a chest x-ray.  Your blood pressure today is 160/100.  This is significantly elevated.  I recommend that you take your medication today and avoid over-the-counter decongestants and antihistamines.  Call in 2 weeks and let me know what your blood pressure is running at home.  Goal blood pressure is less than 130/80. Try to limit your sodium  Return to see me in 4 weeks for blood pressure.

## 2018-02-10 ENCOUNTER — Telehealth: Payer: Self-pay | Admitting: Endocrinology

## 2018-02-10 NOTE — Telephone Encounter (Signed)
lft pt vm to return call, but I do not see any recent results for this pt

## 2018-02-10 NOTE — Telephone Encounter (Signed)
Patient is requesting a call back with a next step with the result she just received.

## 2018-02-11 ENCOUNTER — Other Ambulatory Visit: Payer: Self-pay | Admitting: Family Medicine

## 2018-02-11 DIAGNOSIS — E119 Type 2 diabetes mellitus without complications: Secondary | ICD-10-CM | POA: Diagnosis not present

## 2018-02-11 DIAGNOSIS — H524 Presbyopia: Secondary | ICD-10-CM | POA: Diagnosis not present

## 2018-02-11 DIAGNOSIS — I10 Essential (primary) hypertension: Secondary | ICD-10-CM

## 2018-02-11 LAB — HM DIABETES EYE EXAM

## 2018-02-12 ENCOUNTER — Ambulatory Visit: Payer: 59 | Admitting: Endocrinology

## 2018-02-12 ENCOUNTER — Encounter: Payer: Self-pay | Admitting: Endocrinology

## 2018-02-12 VITALS — BP 162/104 | HR 93 | Temp 97.9°F | Ht 63.0 in | Wt 269.4 lb

## 2018-02-12 DIAGNOSIS — E1139 Type 2 diabetes mellitus with other diabetic ophthalmic complication: Secondary | ICD-10-CM

## 2018-02-12 DIAGNOSIS — E1165 Type 2 diabetes mellitus with hyperglycemia: Secondary | ICD-10-CM | POA: Diagnosis not present

## 2018-02-12 DIAGNOSIS — IMO0002 Reserved for concepts with insufficient information to code with codable children: Secondary | ICD-10-CM

## 2018-02-12 MED ORDER — BASAGLAR KWIKPEN 100 UNIT/ML ~~LOC~~ SOPN: 25.0000 [IU] | PEN_INJECTOR | SUBCUTANEOUS | 3 refills | Status: DC

## 2018-02-12 MED ORDER — METFORMIN HCL ER 500 MG PO TB24: 500.0000 mg | ORAL_TABLET | Freq: Two times a day (BID) | ORAL | 3 refills | Status: DC

## 2018-02-12 NOTE — Progress Notes (Signed)
Subjective:    Patient ID: Victoria Padilla, female    DOB: 1965-08-13, 52 y.o.   MRN: [id number]  HPI Pt returns for f/u of diabetes mellitus: DM type: Insulin-requiring type 2 Dx'ed: 0947 Complications: DR Therapy: insulin since 2019, and 3 oral meds.   GDM: 1998, but DM persisted after the pregnancy DKA: never Severe hypoglycemia: never Pancreatitis: never Pancreatic imaging: normal on 2017 [place] Other: she declined multiple daily injections.  she took trulicity for a few months in 2016; she has thyroid pseudonodule.   Interval history: pt states she feels well in general.  She takes med as rx'ed.  no cbg record, but states cbg's are in the high-100's.   Past Medical History:  Diagnosis Date  . Acid reflux   . Anemia   . Arthritis   . Cervical dysplasia   . Diabetes mellitus   . Heart murmur   . Hyperlipidemia   . Hypertension   . LBP (low back pain)   . Obesity   . Sarcoidosis   . Vitamin D deficiency     Past Surgical History:  Procedure Laterality Date  . BUNIONECTOMY    . CHOLECYSTECTOMY    . COLPOSCOPY    . GYNECOLOGIC CRYOSURGERY    . KNEE ARTHROSCOPY    . TUBAL LIGATION      Social History   Socioeconomic History  . Marital status: Single    Spouse name: Not on file  . Number of children: 3  . Years of education: Not on file  . Highest education level: Not on file  Occupational History    Employer: [employer]  . Financial resource strain: Not on file  . Food insecurity:    Worry: Not on file    Inability: Not on file  . Transportation needs:    Medical: Not on file    Non-medical: Not on file  Tobacco Use  . Smoking status: Never Smoker  . Smokeless tobacco: Never Used  Substance and Sexual Activity  . Alcohol use: No  . Drug use: No  . Sexual activity: Never    Birth control/protection: Surgical  Lifestyle  . Physical activity:    Days per week: Not on file    Minutes per session: Not on file  . Stress: Not on file    Relationships  . Social connections:    Talks on phone: Not on file    Gets together: Not on file    Attends religious service: Not on file    Active member of club or organization: Not on file    Attends meetings of clubs or organizations: Not on file    Relationship status: Not on file  . Intimate partner violence:    Fear of current or ex partner: Not on file    Emotionally abused: Not on file    Physically abused: Not on file    Forced sexual activity: Not on file  Other Topics Concern  . Not on file  Social History Narrative  . Not on file    Current Outpatient Medications on File Prior to Visit  Medication Sig Dispense Refill  . albuterol (PROVENTIL HFA;VENTOLIN HFA) 108 (90 Base) MCG/ACT inhaler Inhale 2 puffs into the lungs every 6 (six) hours as needed for wheezing or shortness of breath. 1 Inhaler 0  . aspirin 81 MG tablet Take 1 tablet (81 mg total) by mouth daily. Reported on 08/08/2015 90 tablet 3  . benazepril (LOTENSIN) 40 MG tablet  TAKE 1 TABLET BY MOUTH EVERY DAY 90 tablet 0  . cetirizine (ZYRTEC) 10 MG tablet Take 1 tablet (10 mg total) by mouth daily. 15 tablet 0  . cholecalciferol (VITAMIN D) 1000 units tablet Take 2,000 Units by mouth daily.    Marland Kitchen FARXIGA 5 MG TABS tablet TAKE 1 TABLET BY MOUTH EVERY DAY 30 tablet 2  . fluticasone (FLONASE) 50 MCG/ACT nasal spray Place 2 sprays into both nostrils daily. 1 g 0  . glucose blood (ONETOUCH VERIO) test strip 1 each by Other route 2 (two) times daily. And lancets 2/day 200 each 3  . hydrochlorothiazide (HYDRODIURIL) 25 MG tablet TAKE 1 TABLET BY MOUTH EVERY DAY 30 tablet 5  . ipratropium (ATROVENT) 0.06 % nasal spray Place 2 sprays into both nostrils 4 (four) times daily. 15 mL 0  . Multiple Vitamins-Minerals (MULTIVITAMIN PO) Take 2 capsules by mouth 2 (two) times daily. Reported on 03/10/2015    . Olopatadine HCl 0.2 % SOLN Apply 1 drop to eye daily. 2.5 mL 0  . Omega-3 Fatty Acids (FISH OIL PO) Take 1 capsule by  mouth daily. Reported on 08/08/2015    . ONETOUCH DELICA LANCETS 07P MISC Test twice a day. Pt uses a one touch verio flex meter 100 each 5  . simvastatin (ZOCOR) 20 MG tablet TAKE 1 TABLET BY MOUTH EVERYDAY AT BEDTIME 90 tablet 1   Current Facility-Administered Medications on File Prior to Visit  Medication Dose Route Frequency Provider Last Rate Last Dose  . 0.9 %  sodium chloride infusion  500 mL Intravenous Continuous Armbruster, Carlota Raspberry, MD        Allergies  Allergen Reactions  . Naprosyn [Naproxen] Nausea Only    Family History  Problem Relation Age of Onset  . Heart failure Mother 69       chf  . Heart failure Father   . Cancer Brother 28       prostate  . Hyperlipidemia Brother   . Hypertension Brother   . Diabetes Brother   . Hypertension Maternal Uncle   . Hyperlipidemia Brother   . Diabetes Brother   . Colon cancer Maternal Grandfather   . Colon polyps Brother   . Heart disease Brother   . Stomach cancer Neg Hx   . Esophageal cancer Neg Hx   . Rectal cancer Neg Hx   . Liver cancer Neg Hx     BP (!) 162/104 (BP Location: Right Arm, Patient Position: Sitting, Cuff Size: Normal)   Pulse 93   Temp 97.9 F (36.6 C) (Oral)   Ht 5\' 3"  (1.6 m)   Wt 269 lb 6.4 oz (122.2 kg)   SpO2 97%   BMI 47.72 kg/m    Review of Systems She has swelling at the right ant neck and adjacent chest wall.  She denies hypoglycemia.  She has 10 months of intermitt abd pain.     Objective:   Physical Exam VITAL SIGNS:  See vs page GENERAL: no distress NECK: slight swelling at the right thyroid area, but no palpable nodule.  Pulses: dorsalis pedis intact bilat.   MSK: no deformity of the feet CV: 1+ bilat leg edema Skin:  no ulcer on the feet.  normal color and temp on the feet.  Old healed surgical scars on both feet. Neuro: sensation is intact to touch on the feet Ext: There is bilateral onychomycosis of the toenails  Lab Results  Component Value Date   HGBA1C 8.4 (A)  01/08/2018  Assessment & Plan:  Insulin-requiring type 2 DM, with DR: she needs increased rx HTN: is noted today abd pain: poss due to metformin  Patient Instructions  Your blood pressure is high today.  Please see your primary care provider soon, to have it rechecked, and to check on the neck swelling.   check your blood sugar twice a day.  vary the time of day when you check, between before the 3 meals, and at bedtime.  also check if you have symptoms of your blood sugar being too high or too low.  please keep a record of the readings and bring it to your next appointment here (or you can bring the meter itself).  You can write it on any piece of paper.  please call us sooner if your blood sugar goes below 70, or if you have a lot of readings over 200. I have sent a prescription to your pharmacy, to increase the Basaglar to 25 units each morning.   Our goal is for the blood sugar to be in the low-100's. Also, I have sent a prescription to your pharmacy, to reduce the metformin to 1 pill, twice a day.  Please ask your PCP if the stomach pain does not go away.   Please come back for a follow-up appointment in 1 month.

## 2018-02-12 NOTE — Patient Instructions (Addendum)
Your blood pressure is high today.  Please see your primary care provider soon, to have it rechecked, and to check on the neck swelling.   check your blood sugar twice a day.  vary the time of day when you check, between before the 3 meals, and at bedtime.  also check if you have symptoms of your blood sugar being too high or too low.  please keep a record of the readings and bring it to your next appointment here (or you can bring the meter itself).  You can write it on any piece of paper.  please call us sooner if your blood sugar goes below 70, or if you have a lot of readings over 200. I have sent a prescription to your pharmacy, to increase the Basaglar to 25 units each morning.   Our goal is for the blood sugar to be in the low-100's. Also, I have sent a prescription to your pharmacy, to reduce the metformin to 1 pill, twice a day.  Please ask your PCP if the stomach pain does not go away.   Please come back for a follow-up appointment in 1 month.

## 2018-02-13 ENCOUNTER — Ambulatory Visit
Admission: RE | Admit: 2018-02-13 | Discharge: 2018-02-13 | Disposition: A | Discharge status: Home / Self Care | Payer: 59 | Source: Ambulatory Visit | Attending: Family Medicine | Admitting: Family Medicine

## 2018-02-13 ENCOUNTER — Encounter: Payer: Self-pay | Admitting: Family Medicine

## 2018-02-13 ENCOUNTER — Ambulatory Visit: Payer: 59 | Admitting: Family Medicine

## 2018-02-13 VITALS — BP 150/100 | HR 84 | Temp 98.1°F | Wt 264.8 lb

## 2018-02-13 DIAGNOSIS — R05 Cough: Secondary | ICD-10-CM | POA: Diagnosis not present

## 2018-02-13 DIAGNOSIS — R10816 Epigastric abdominal tenderness: Secondary | ICD-10-CM

## 2018-02-13 DIAGNOSIS — R0601 Orthopnea: Secondary | ICD-10-CM | POA: Insufficient documentation

## 2018-02-13 DIAGNOSIS — D869 Sarcoidosis, unspecified: Secondary | ICD-10-CM | POA: Diagnosis not present

## 2018-02-13 DIAGNOSIS — R1013 Epigastric pain: Secondary | ICD-10-CM

## 2018-02-13 DIAGNOSIS — R0789 Other chest pain: Secondary | ICD-10-CM

## 2018-02-13 DIAGNOSIS — K219 Gastro-esophageal reflux disease without esophagitis: Secondary | ICD-10-CM

## 2018-02-13 DIAGNOSIS — Z9189 Other specified personal risk factors, not elsewhere classified: Secondary | ICD-10-CM

## 2018-02-13 DIAGNOSIS — I10 Essential (primary) hypertension: Secondary | ICD-10-CM | POA: Diagnosis not present

## 2018-02-13 DIAGNOSIS — Z8249 Family history of ischemic heart disease and other diseases of the circulatory system: Secondary | ICD-10-CM

## 2018-02-13 DIAGNOSIS — R058 Other specified cough: Secondary | ICD-10-CM

## 2018-02-13 DIAGNOSIS — R0683 Snoring: Secondary | ICD-10-CM

## 2018-02-13 DIAGNOSIS — G4719 Other hypersomnia: Secondary | ICD-10-CM

## 2018-02-13 DIAGNOSIS — R932 Abnormal findings on diagnostic imaging of liver and biliary tract: Secondary | ICD-10-CM

## 2018-02-13 DIAGNOSIS — R29898 Other symptoms and signs involving the musculoskeletal system: Secondary | ICD-10-CM

## 2018-02-13 HISTORY — DX: Orthopnea: R06.01

## 2018-02-13 MED ORDER — AMLODIPINE BESYLATE 5 MG PO TABS: 5.0000 mg | ORAL_TABLET | Freq: Every day | ORAL | 1 refills | Status: DC

## 2018-02-13 NOTE — Progress Notes (Signed)
Subjective:    Patient ID: Victoria Padilla, female    DOB: [birth year], 52 y.o.   MRN: [id number]  HPI Chief Complaint  Patient presents with  . swelling at collar bone    swelling at collar bone- tuesday stomach pain- no diarrhea or vomitting. tight stomach    She is here with complaints of her collar bone being swollen and anterior neck "pressure" mainly with flexing her neck. History of injury to her right collar bone.  States she has ongoing intermittent chest tightness since March. This is not worsening.  States she has seen a cardiologist in the past but this was more than 10 years ago.  Several family members with CHF and ischemic heart disease.   Epigastric pain for several weeks. Feels full and bloated often. Eructation. She has been prescribed PPIs in the past. Not currently taking any medication.  History of GERD.   BP is uncontrolled and she reports good daily compliance of medications.   6 week history of cough. No longer productive. History of sarcoidosis.  States she has had abnormal liver US in the past. Has been evaluated by GI.   Needs screening for sleep apnea. Snores, orthopnea, daytime sleepiness.   Diabetic eye exam normal per patient.   Dr. Loanne Drilling is managing her diabetes.   Denies fever, chills, dizziness, palpitations, shortness of breath, abdominal pain, N/V/D, urinary symptoms, LE edema.   Reviewed allergies, medications, past medical, surgical, family, and social history.    Review of Systems Pertinent positives and negatives in the history of present illness.     Objective:   Physical Exam Constitutional:      General: She is not in acute distress.    Appearance: She is obese. She is not ill-appearing.  HENT:     Nose: Nose normal.     Mouth/Throat:     Mouth: Mucous membranes are moist.     Pharynx: Oropharynx is clear. No uvula swelling.  Eyes:     General: Lids are normal.     Extraocular Movements: Extraocular movements intact.     Conjunctiva/sclera: Conjunctivae normal.  Neck:     Musculoskeletal: Full passive range of motion without pain and neck supple. No edema.     Thyroid: No thyromegaly.     Vascular: No carotid bruit or JVD.     Trachea: Trachea and phonation normal.     Comments: No stridor  Cardiovascular:     Rate and Rhythm: Normal rate and regular rhythm.     Pulses: Normal pulses.     Heart sounds: Normal heart sounds. No friction rub.     Comments: Non pitting edema bilaterally  Pulmonary:     Effort: Pulmonary effort is normal.     Breath sounds: Normal breath sounds. No stridor.  Chest:       Comments: Sternoclavicular joint with TTP and area of bone enlarged Abdominal:     General: Abdomen is protuberant. Bowel sounds are normal.     Palpations: Abdomen is soft.     Tenderness: There is abdominal tenderness in the epigastric area. There is no right CVA tenderness, left CVA tenderness, guarding or rebound. Negative signs include Murphy's sign and McBurney's sign.  Lymphadenopathy:     Cervical: No cervical adenopathy.     Upper Body:     Right upper body: No supraclavicular adenopathy.     Left upper body: No supraclavicular adenopathy.  Skin:    General: Skin is warm and dry.  Capillary Refill: Capillary refill takes less than 2 seconds.  Neurological:     General: No focal deficit present.     Mental Status: She is alert and oriented to person, place, and time.     Cranial Nerves: Cranial nerves are intact.     Sensory: Sensation is intact.     Motor: Motor function is intact.  Psychiatric:        Attention and Perception: Attention normal.        Mood and Affect: Mood normal.        Speech: Speech normal.        Behavior: Behavior normal.        Thought Content: Thought content normal.    BP (!) 150/100   Pulse 84   Temp 98.1 F (36.7 C) (Oral)   Wt 264 lb 12.8 oz (120.1 kg)   BMI 46.91 kg/m       Assessment & Plan:  Epigastric pain - Plan: CBC with  Differential/Platelet, Comprehensive metabolic panel, US Abdomen Complete, Lipase  Morbid obesity (HCC) - Plan: TSH, T4, free  Sarcoidosis - Plan: DG Chest 2 View  Gastroesophageal reflux disease, esophagitis presence not specified  Right-sided chest wall pain - Plan: DG Chest 2 View, Ambulatory referral to Cardiology  Cough present for greater than 3 weeks - Plan: DG Chest 2 View  Uncontrolled hypertension - Plan: CBC with Differential/Platelet, Comprehensive metabolic panel, Ambulatory referral to Cardiology, amLODipine (NORVASC) 5 MG tablet  Excessive daytime sleepiness - Plan: Home sleep test  Snoring - Plan: Home sleep test  Abnormal finding on imaging of liver - Plan: US Abdomen Complete  Neck tightness - Plan: TSH, T4, free  Epigastric abdominal tenderness without rebound tenderness - Plan: US Abdomen Complete, Lipase  Orthopnea - Plan: Home sleep test, Ambulatory referral to Cardiology  At risk for obstructive sleep apnea - Plan: Home sleep test  Family history of heart disease in female family member before age 87 - Plan: Ambulatory referral to Cardiology  Uncontrolled HTN- good medication compliance per patient will add amlodipine. She will follow up with cardiology for this as well as for intermittent ongoing right chest pain, orthopnea and family history of heart disease.  Epworth sleepiness scale is 18. Suspect she has sleep apnea. Home sleep study ordered. GERD- try once daily Dexilant. Samples given. Discussed lifestyle management. Follow up in 2 weeks.  Epigastric pain and hx of abnormal liver on previous US. Abdominal US ordered. Labs ordered.  Right clavicle appears to have larger area of bone on right.  Cough x 6 weeks- chest XR ordered.  Follow up in 2 weeks

## 2018-02-13 NOTE — Patient Instructions (Addendum)
Go to [facility] imaging after your visit and get the chest x-ray. Start the amlodipine once daily. Check your BP at home.   Start taking once daily Dexilant for acid and epigastric pain. You are scheduled for an abdominal ultrasound next Friday.  There is a separate handout with the information regarding the ultrasound.  I am referring you for a home sleep study.  I suspect that you may have sleep apnea.  I am referring you to cardiology for further evaluation of uncontrolled hypertension, shortness of breath when laying flat, ongoing intermittent chest wall pain and due to your significant family history of heart disease.  They will call you to schedule an appointment.  We will call you with your lab results.  Return to see me in 2 weeks. If you develop worsening chest pain, shortness of breath or any other new symptoms then you should be seen right away and I would recommend going to the emergency      Gastroesophageal Reflux Disease, Adult Gastroesophageal reflux (GER) happens when acid from the stomach flows up into the tube that connects the mouth and the stomach (esophagus). Normally, food travels down the esophagus and stays in the stomach to be digested. With GER, food and stomach acid sometimes move back up into the esophagus. You may have a disease called gastroesophageal reflux disease (GERD) if the reflux:  Happens often.  Causes frequent or very bad symptoms.  Causes problems such as damage to the esophagus. When this happens, the esophagus becomes sore and swollen (inflamed). Over time, GERD can make small holes (ulcers) in the lining of the esophagus. What are the causes? This condition is caused by a problem with the muscle between the esophagus and the stomach. When this muscle is weak or not normal, it does not close properly to keep food and acid from coming back up from the stomach. The muscle can be weak because of:  Tobacco use.  Pregnancy.  Having a certain  type of hernia (hiatal hernia).  Alcohol use.  Certain foods and drinks, such as coffee, chocolate, onions, and peppermint. What increases the risk? You are more likely to develop this condition if you:  Are overweight.  Have a disease that affects your connective tissue.  Use NSAID medicines. What are the signs or symptoms? Symptoms of this condition include:  Heartburn.  Difficult or painful swallowing.  The feeling of having a lump in the throat.  A bitter taste in the mouth.  Bad breath.  Having a lot of saliva.  Having an upset or bloated stomach.  Belching.  Chest pain. Different conditions can cause chest pain. Make sure you see your doctor if you have chest pain.  Shortness of breath or noisy breathing (wheezing).  Ongoing (chronic) cough or a cough at night.  Wearing away of the surface of teeth (tooth enamel).  Weight loss. How is this treated? Treatment will depend on how bad your symptoms are. Your doctor may suggest:  Changes to your diet.  Medicine.  Surgery. Follow these instructions at home: Eating and drinking   Follow a diet as told by your doctor. You may need to avoid foods and drinks such as: ? Coffee and tea (with or without caffeine). ? Drinks that contain alcohol. ? Energy drinks and sports drinks. ? Bubbly (carbonated) drinks or sodas. ? Chocolate and cocoa. ? Peppermint and mint flavorings. ? Garlic and onions. ? Horseradish. ? Spicy and acidic foods. These include peppers, chili powder, curry powder, vinegar, hot  sauces, and BBQ sauce. ? Citrus fruit juices and citrus fruits, such as oranges, lemons, and limes. ? Tomato-based foods. These include red sauce, chili, salsa, and pizza with red sauce. ? Fried and fatty foods. These include donuts, french fries, potato chips, and high-fat dressings. ? High-fat meats. These include hot dogs, rib eye steak, sausage, ham, and bacon. ? High-fat dairy items, such as whole milk,  butter, and cream cheese.  Eat small meals often. Avoid eating large meals.  Avoid drinking large amounts of liquid with your meals.  Avoid eating meals during the 2-3 hours before bedtime.  Avoid lying down right after you eat.  Do not exercise right after you eat. Lifestyle   Do not use any products that contain nicotine or tobacco. These include cigarettes, e-cigarettes, and chewing tobacco. If you need help quitting, ask your doctor.  Try to lower your stress. If you need help doing this, ask your doctor.  If you are overweight, lose an amount of weight that is healthy for you. Ask your doctor about a safe weight loss goal. General instructions  Pay attention to any changes in your symptoms.  Take over-the-counter and prescription medicines only as told by your doctor. Do not take aspirin, ibuprofen, or other NSAIDs unless your doctor says it is okay.  Wear loose clothes. Do not wear anything tight around your waist.  Raise (elevate) the head of your bed about 6 inches (15 cm).  Avoid bending over if this makes your symptoms worse.  Keep all follow-up visits as told by your doctor. This is important. Contact a doctor if:  You have new symptoms.  You lose weight and you do not know why.  You have trouble swallowing or it hurts to swallow.  You have wheezing or a cough that keeps happening.  Your symptoms do not get better with treatment.  You have a hoarse voice. Get help right away if:  You have pain in your arms, neck, jaw, teeth, or back.  You feel sweaty, dizzy, or light-headed.  You have chest pain or shortness of breath.  You throw up (vomit) and your throw-up looks like blood or coffee grounds.  You pass out (faint).  Your poop (stool) is bloody or black.  You cannot swallow, drink, or eat. Summary  If a person has gastroesophageal reflux disease (GERD), food and stomach acid move back up into the esophagus and cause symptoms or problems such as  damage to the esophagus.  Treatment will depend on how bad your symptoms are.  Follow a diet as told by your doctor.  Take all medicines only as told by your doctor. This information is not intended to replace advice given to you by your health care provider. Make sure you discuss any questions you have with your health care provider. Document Released: 07/31/2007 Document Revised: 08/20/2017 Document Reviewed: 08/20/2017 Elsevier Interactive Patient Education  2019 [employer].

## 2018-02-14 LAB — CBC WITH DIFFERENTIAL/PLATELET
Basophils Absolute: 0.1 10*3/uL (ref 0.0–0.2)
Basos: 1 %
EOS (ABSOLUTE): 0.1 10*3/uL (ref 0.0–0.4)
Eos: 1 %
HEMOGLOBIN: 11.7 g/dL (ref 11.1–15.9)
Hematocrit: 37.5 % (ref 34.0–46.6)
IMMATURE GRANS (ABS): 0 10*3/uL (ref 0.0–0.1)
Immature Granulocytes: 0 %
Lymphocytes Absolute: 3.6 10*3/uL — ABNORMAL HIGH (ref 0.7–3.1)
Lymphs: 50 %
MCH: 22.2 pg — ABNORMAL LOW (ref 26.6–33.0)
MCHC: 31.2 g/dL — ABNORMAL LOW (ref 31.5–35.7)
MCV: 71 fL — ABNORMAL LOW (ref 79–97)
Monocytes Absolute: 0.6 10*3/uL (ref 0.1–0.9)
Monocytes: 8 %
Neutrophils Absolute: 2.9 10*3/uL (ref 1.4–7.0)
Neutrophils: 40 %
Platelets: 269 10*3/uL (ref 150–450)
RBC: 5.26 x10E6/uL (ref 3.77–5.28)
RDW: 14.6 % (ref 12.3–15.4)
WBC: 7.2 10*3/uL (ref 3.4–10.8)

## 2018-02-14 LAB — COMPREHENSIVE METABOLIC PANEL
ALBUMIN: 4 g/dL (ref 3.5–5.5)
ALT: 37 IU/L — ABNORMAL HIGH (ref 0–32)
AST: 31 IU/L (ref 0–40)
Albumin/Globulin Ratio: 1.4 (ref 1.2–2.2)
Alkaline Phosphatase: 94 IU/L (ref 39–117)
BUN/Creatinine Ratio: 13 (ref 9–23)
BUN: 9 mg/dL (ref 6–24)
Bilirubin Total: <0.2 mg/dL (ref 0.0–1.2)
CO2: 24 mmol/L (ref 20–29)
Calcium: 9.4 mg/dL (ref 8.7–10.2)
Chloride: 100 mmol/L (ref 96–106)
Creatinine, Ser: 0.71 mg/dL (ref 0.57–1.00)
GFR, EST AFRICAN AMERICAN: 112 mL/min/{1.73_m2} (ref >59)
GFR, EST NON AFRICAN AMERICAN: 97 mL/min/{1.73_m2} (ref >59)
Globulin, Total: 2.9 g/dL (ref 1.5–4.5)
Glucose: 104 mg/dL — ABNORMAL HIGH (ref 65–99)
Potassium: 4.7 mmol/L (ref 3.5–5.2)
Sodium: 139 mmol/L (ref 134–144)
TOTAL PROTEIN: 6.9 g/dL (ref 6.0–8.5)

## 2018-02-14 LAB — T4, FREE: Free T4: 1.11 ng/dL (ref 0.82–1.77)

## 2018-02-14 LAB — LIPASE: Lipase: 78 U/L — ABNORMAL HIGH (ref 14–72)

## 2018-02-14 LAB — TSH: TSH: 1.43 u[IU]/mL (ref 0.450–4.500)

## 2018-02-20 ENCOUNTER — Ambulatory Visit
Admission: RE | Admit: 2018-02-20 | Discharge: 2018-02-20 | Disposition: A | Discharge status: Home / Self Care | Payer: 59 | Source: Ambulatory Visit | Attending: Family Medicine | Admitting: Family Medicine

## 2018-02-20 DIAGNOSIS — R932 Abnormal findings on diagnostic imaging of liver and biliary tract: Secondary | ICD-10-CM

## 2018-02-20 DIAGNOSIS — R1013 Epigastric pain: Secondary | ICD-10-CM

## 2018-02-20 DIAGNOSIS — R10816 Epigastric abdominal tenderness: Secondary | ICD-10-CM

## 2018-02-20 DIAGNOSIS — R140 Abdominal distension (gaseous): Secondary | ICD-10-CM | POA: Diagnosis not present

## 2018-02-27 ENCOUNTER — Ambulatory Visit: Payer: 59 | Admitting: Family Medicine

## 2018-03-11 ENCOUNTER — Encounter: Payer: Self-pay | Admitting: Endocrinology

## 2018-03-11 ENCOUNTER — Ambulatory Visit: Payer: 59 | Admitting: Endocrinology

## 2018-03-11 VITALS — BP 144/98 | HR 100 | Ht 63.0 in | Wt 258.2 lb

## 2018-03-11 DIAGNOSIS — IMO0002 Reserved for concepts with insufficient information to code with codable children: Secondary | ICD-10-CM

## 2018-03-11 DIAGNOSIS — E1139 Type 2 diabetes mellitus with other diabetic ophthalmic complication: Secondary | ICD-10-CM | POA: Diagnosis not present

## 2018-03-11 DIAGNOSIS — E1165 Type 2 diabetes mellitus with hyperglycemia: Secondary | ICD-10-CM | POA: Diagnosis not present

## 2018-03-11 LAB — POCT GLYCOSYLATED HEMOGLOBIN (HGB A1C): Hemoglobin A1C: 10.5 % — AB (ref 4.0–5.6)

## 2018-03-11 MED ORDER — BASAGLAR KWIKPEN 100 UNIT/ML ~~LOC~~ SOPN: 35.0000 [IU] | PEN_INJECTOR | SUBCUTANEOUS | 3 refills | Status: DC

## 2018-03-11 NOTE — Patient Instructions (Addendum)
Your blood pressure is high today.  Please see your primary care provider soon, to have it rechecked, and to check on the neck swelling.   check your blood sugar twice a day.  vary the time of day when you check, between before the 3 meals, and at bedtime.  also check if you have symptoms of your blood sugar being too high or too low.  please keep a record of the readings and bring it to your next appointment here (or you can bring the meter itself).  You can write it on any piece of paper.  please call us sooner if your blood sugar goes below 70, or if you have a lot of readings over 200. I have sent a prescription to your pharmacy, to increase the Basaglar to 35 units each morning.   Please call or message [place] next week, to tell us how the blood sugar is doing.  Our goal is for the blood sugar to be in the low-100's. Please come back for a follow-up appointment in 2 months.     Bariatric Surgery You have so much to gain by losing weight.  You may have already tried every diet and exercise plan imaginable.  And, you may have sought advice from your family physician, too.   Sometimes, in spite of such diligent efforts, you may not be able to achieve long-term results by yourself.  In cases of severe obesity, bariatric or weight loss surgery is a proven method of achieving long-term weight control.  Our Services Our bariatric surgery programs offer our patients new hope and long-term weight-loss solution.  Since introducing our services in 2003, we have conducted more than 2,400 successful procedures.  Our program is designated as a Programmer, multimedia by the Metabolic and Bariatric Surgery Accreditation and Quality Improvement Program (MBSAQIP), a IT trainer that sets rigorous patient safety and outcome standards.  Our program is also designated as a Ecologist by [employer].   Our exceptional weight-loss surgery team specializes in diagnosis, treatment,  follow-up care, and ongoing support for our patients with severe weight loss challenges.  We currently offer laparoscopic sleeve gastrectomy, gastric bypass, and adjustable gastric band (LAP-BAND).    Attend our [place] Choosing to undergo a bariatric procedure is a big decision, and one that should not be taken lightly.  You now have two options in how you learn about weight-loss surgery - in person or online.  Our objective is to ensure you have all of the information that you need to evaluate the advantages and obligations of this life changing procedure.  Please note that you are not alone in this process, and our experienced team is ready to assist and answer all of your questions.  There are several ways to register for a seminar (either on-line or in person): 1)  Call [phone] 2) Go on-line to [facility] and register for either type of seminar.  [email or web address]

## 2018-03-11 NOTE — Progress Notes (Signed)
Subjective:    Patient ID: Victoria Padilla, female    DOB: [birth year], 53 y.o.   MRN: [id number]  HPI Pt returns for f/u of diabetes mellitus: DM type: Insulin-requiring type 2 Dx'ed: 6629 Complications: DR Therapy: insulin since 2019, and 3 oral meds.   GDM: 1998, but DM persisted after the pregnancy DKA: never Severe hypoglycemia: never Pancreatitis: never Pancreatic imaging: normal on 2017 [place] Other: she declined multiple daily injections.  she took trulicity for a few months in 2016; she has thyroid pseudonodule.     Interval history: pt states she feels well in general.  She says she never misses the meds.  Meter is downloaded today, and the printout is scanned into the record.  cbg varies from the 100's-300's.  Almost all are checked fasting.  Past Medical History:  Diagnosis Date  . Acid reflux   . Anemia   . Arthritis   . Cervical dysplasia   . Diabetes mellitus   . Heart murmur   . Hyperlipidemia   . Hypertension   . LBP (low back pain)   . Obesity   . Sarcoidosis   . Vitamin D deficiency     Past Surgical History:  Procedure Laterality Date  . BUNIONECTOMY    . CHOLECYSTECTOMY    . COLPOSCOPY    . GYNECOLOGIC CRYOSURGERY    . KNEE ARTHROSCOPY    . TUBAL LIGATION      Social History   Socioeconomic History  . Marital status: Single    Spouse name: Not on file  . Number of children: 3  . Years of education: Not on file  . Highest education level: Not on file  Occupational History    Employer: [employer] Needs  . Financial resource strain: Not on file  . Food insecurity:    Worry: Not on file    Inability: Not on file  . Transportation needs:    Medical: Not on file    Non-medical: Not on file  Tobacco Use  . Smoking status: Never Smoker  . Smokeless tobacco: Never Used  Substance and Sexual Activity  . Alcohol use: No  . Drug use: No  . Sexual activity: Never    Birth control/protection: Surgical  Lifestyle  . Physical activity:      Days per week: Not on file    Minutes per session: Not on file  . Stress: Not on file  Relationships  . Social connections:    Talks on phone: Not on file    Gets together: Not on file    Attends religious service: Not on file    Active member of club or organization: Not on file    Attends meetings of clubs or organizations: Not on file    Relationship status: Not on file  . Intimate partner violence:    Fear of current or ex partner: Not on file    Emotionally abused: Not on file    Physically abused: Not on file    Forced sexual activity: Not on file  Other Topics Concern  . Not on file  Social History Narrative  . Not on file    Current Outpatient Medications on File Prior to Visit  Medication Sig Dispense Refill  . albuterol (PROVENTIL HFA;VENTOLIN HFA) 108 (90 Base) MCG/ACT inhaler Inhale 2 puffs into the lungs every 6 (six) hours as needed for wheezing or shortness of breath. 1 Inhaler 0  . amLODipine (NORVASC) 5 MG tablet Take 1 tablet (5  mg total) by mouth daily. 30 tablet 1  . aspirin 81 MG tablet Take 1 tablet (81 mg total) by mouth daily. Reported on 08/08/2015 90 tablet 3  . benazepril (LOTENSIN) 40 MG tablet TAKE 1 TABLET BY MOUTH EVERY DAY 90 tablet 0  . cetirizine (ZYRTEC) 10 MG tablet Take 1 tablet (10 mg total) by mouth daily. 15 tablet 0  . cholecalciferol (VITAMIN D) 1000 units tablet Take 2,000 Units by mouth daily.    Marland Kitchen FARXIGA 5 MG TABS tablet TAKE 1 TABLET BY MOUTH EVERY DAY 30 tablet 2  . fluticasone (FLONASE) 50 MCG/ACT nasal spray Place 2 sprays into both nostrils daily. 1 g 0  . glucose blood (ONETOUCH VERIO) test strip 1 each by Other route 2 (two) times daily. And lancets 2/day 200 each 3  . hydrochlorothiazide (HYDRODIURIL) 25 MG tablet TAKE 1 TABLET BY MOUTH EVERY DAY 30 tablet 5  . ipratropium (ATROVENT) 0.06 % nasal spray Place 2 sprays into both nostrils 4 (four) times daily. 15 mL 0  . metFORMIN (GLUCOPHAGE-XR) 500 MG 24 hr tablet Take 1  tablet (500 mg total) by mouth 2 (two) times daily. 180 tablet 3  . Multiple Vitamins-Minerals (MULTIVITAMIN PO) Take 2 capsules by mouth 2 (two) times daily. Reported on 03/10/2015    . Olopatadine HCl 0.2 % SOLN Apply 1 drop to eye daily. 2.5 mL 0  . Omega-3 Fatty Acids (FISH OIL PO) Take 1 capsule by mouth daily. Reported on 08/08/2015    . ONETOUCH DELICA LANCETS 16S MISC Test twice a day. Pt uses a one touch verio flex meter 100 each 5  . simvastatin (ZOCOR) 20 MG tablet TAKE 1 TABLET BY MOUTH EVERYDAY AT BEDTIME 90 tablet 1   Current Facility-Administered Medications on File Prior to Visit  Medication Dose Route Frequency Provider Last Rate Last Dose  . 0.9 %  sodium chloride infusion  500 mL Intravenous Continuous Armbruster, Carlota Raspberry, MD        Allergies  Allergen Reactions  . Naprosyn [Naproxen] Nausea Only    Family History  Problem Relation Age of Onset  . Heart failure Mother 24       chf  . Heart failure Father   . Cancer Brother 70       prostate  . Hyperlipidemia Brother   . Hypertension Brother   . Diabetes Brother   . Hypertension Maternal Uncle   . Hyperlipidemia Brother   . Diabetes Brother   . Colon cancer Maternal Grandfather   . Colon polyps Brother   . Heart disease Brother   . Stomach cancer Neg Hx   . Esophageal cancer Neg Hx   . Rectal cancer Neg Hx   . Liver cancer Neg Hx     BP (!) 144/98 (BP Location: Right Arm, Patient Position: Sitting, Cuff Size: Large)   Pulse 100   Ht 5\' 3"  (1.6 m)   Wt 258 lb 3.2 oz (117.1 kg)   SpO2 96%   BMI 45.74 kg/m   Review of Systems She denies hypoglycemia.  abd pain is much less with reduction of metformin.      Objective:   Physical Exam VITAL SIGNS:  See vs page GENERAL: no distress NECK: slight swelling at the right thyroid area, but no palpable nodule.  Pulses: dorsalis pedis intact bilat.   MSK: no deformity of the feet CV: trace bilat leg edema Skin:  no ulcer on the feet.  normal color and temp  on the feet.  Old healed surgical scars on both feet. Neuro: sensation is intact to touch on the feet Ext: There is bilateral onychomycosis of the toenails.   Lab Results  Component Value Date   HGBA1C 10.5 (A) 03/11/2018       Assessment & Plan:  Insulin-requiring type 2 DM, with DR: worse Obesity: persistent Edema: this limits rx options. HTN: is noted today   Patient Instructions  Your blood pressure is high today.  Please see your primary care provider soon, to have it rechecked, and to check on the neck swelling.   check your blood sugar twice a day.  vary the time of day when you check, between before the 3 meals, and at bedtime.  also check if you have symptoms of your blood sugar being too high or too low.  please keep a record of the readings and bring it to your next appointment here (or you can bring the meter itself).  You can write it on any piece of paper.  please call us sooner if your blood sugar goes below 70, or if you have a lot of readings over 200. I have sent a prescription to your pharmacy, to increase the Basaglar to 35 units each morning.   Please call or message [place] next week, to tell us how the blood sugar is doing.  Our goal is for the blood sugar to be in the low-100's. Please come back for a follow-up appointment in 2 months.     Bariatric Surgery You have so much to gain by losing weight.  You may have already tried every diet and exercise plan imaginable.  And, you may have sought advice from your family physician, too.   Sometimes, in spite of such diligent efforts, you may not be able to achieve long-term results by yourself.  In cases of severe obesity, bariatric or weight loss surgery is a proven method of achieving long-term weight control.  Our Services Our bariatric surgery programs offer our patients new hope and long-term weight-loss solution.  Since introducing our services in 2003, we have conducted more than 2,400 successful procedures.  Our  program is designated as a Programmer, multimedia by the Metabolic and Bariatric Surgery Accreditation and Quality Improvement Program (MBSAQIP), a IT trainer that sets rigorous patient safety and outcome standards.  Our program is also designated as a Ecologist by [employer].   Our exceptional weight-loss surgery team specializes in diagnosis, treatment, follow-up care, and ongoing support for our patients with severe weight loss challenges.  We currently offer laparoscopic sleeve gastrectomy, gastric bypass, and adjustable gastric band (LAP-BAND).    Attend our [place] Choosing to undergo a bariatric procedure is a big decision, and one that should not be taken lightly.  You now have two options in how you learn about weight-loss surgery - in person or online.  Our objective is to ensure you have all of the information that you need to evaluate the advantages and obligations of this life changing procedure.  Please note that you are not alone in this process, and our experienced team is ready to assist and answer all of your questions.  There are several ways to register for a seminar (either on-line or in person): 1)  Call [phone] 2) Go on-line to [facility] and register for either type of seminar.  [email or web address]

## 2018-03-18 ENCOUNTER — Ambulatory Visit (HOSPITAL_BASED_OUTPATIENT_CLINIC_OR_DEPARTMENT_OTHER): Payer: 59 | Attending: Family Medicine | Admitting: Internal Medicine

## 2018-03-18 DIAGNOSIS — R0601 Orthopnea: Secondary | ICD-10-CM

## 2018-03-18 DIAGNOSIS — G4733 Obstructive sleep apnea (adult) (pediatric): Secondary | ICD-10-CM | POA: Insufficient documentation

## 2018-03-18 DIAGNOSIS — G4719 Other hypersomnia: Secondary | ICD-10-CM | POA: Diagnosis present

## 2018-03-18 DIAGNOSIS — R0683 Snoring: Secondary | ICD-10-CM

## 2018-03-18 DIAGNOSIS — Z9189 Other specified personal risk factors, not elsewhere classified: Secondary | ICD-10-CM

## 2018-03-22 ENCOUNTER — Encounter: Payer: Self-pay | Admitting: Family Medicine

## 2018-03-22 DIAGNOSIS — G4733 Obstructive sleep apnea (adult) (pediatric): Secondary | ICD-10-CM

## 2018-03-22 HISTORY — DX: Obstructive sleep apnea (adult) (pediatric): G47.33

## 2018-03-22 NOTE — Procedures (Signed)
   Patient Name: Victoria Padilla, Victoria Padilla Date: 03/18/2018 Gender: Female D.O.B: 08-29-1965 Age (years): 55 Referring Provider: [name] NP Height (inches): 37 Interpreting Physician: Baird Lyons MD, ABSM Weight (lbs): 255 RPSGT: Jonna Coup BMI: 45 MRN: [id number] Neck Size:   CLINICAL INFORMATION Sleep Study Type: HST Indication for sleep study: Excessive Daytime Sleepiness, Snoring Epworth Sleepiness Score: 18  SLEEP STUDY TECHNIQUE A multi-channel overnight portable sleep study was performed. The channels recorded were: nasal airflow, thoracic respiratory movement, and oxygen saturation with a pulse oximetry. Snoring was also monitored.  MEDICATIONS Patient self administered medications include: none reported  SLEEP ARCHITECTURE Patient was studied for 381.3 minutes. The sleep efficiency was 99.5 % and the patient was supine for 61.8%. The arousal index was 0.0 per hour.  RESPIRATORY PARAMETERS The overall AHI was 10.2 per hour, with a central apnea index of 0.0 per hour. The oxygen nadir was 82% during sleep.  CARDIAC DATA Mean heart rate during sleep was 91.4 bpm.  IMPRESSIONS - Mild obstructive sleep apnea occurred during this study (AHI = 10.2/h). - No significant central sleep apnea occurred during this study (CAI = 0.0/h). - Moderate oxygen desaturation was noted during this study (Min O2 = 82%). Mean sat 92%. - Patient snored.  DIAGNOSIS - Obstructive Sleep Apnea (327.23 [G47.33 ICD-10])  RECOMMENDATIONS - Treatment for mild OSA is directed at symtoms, with consideration of co-morbidities. Conservative measures may include observation, weight loss, sleep position off back. - If appropriate, CPAP, a fitted oral appliance, Sleep medical consultation or ENT evaluation may be additional options..  - Be careful with alcohol, sedatives and other CNS depressants that may worsen sleep apnea and disrupt normal sleep architecture. - Sleep hygiene should be  reviewed to assess factors that may improve sleep quality. - Weight management and regular exercise should be initiated or continued.  [Electronically signed] 03/22/2018 11:41 AM  Baird Lyons MD, ABSM Diplomate, American Board of Sleep Medicine   NPI: [id number]                          [place] [place]  ELECTRONICALLY SIGNED ON:  03/22/2018, 11:37 AM [place] PH: [phone]   FX: [phone] [place]

## 2018-04-06 ENCOUNTER — Encounter: Payer: Self-pay | Admitting: Nurse Practitioner

## 2018-04-06 ENCOUNTER — Ambulatory Visit (INDEPENDENT_AMBULATORY_CARE_PROVIDER_SITE_OTHER): Payer: 59 | Admitting: Cardiovascular Disease

## 2018-04-06 ENCOUNTER — Encounter: Payer: Self-pay | Admitting: Cardiovascular Disease

## 2018-04-06 VITALS — BP 138/90 | HR 94 | Ht 63.0 in | Wt 257.4 lb

## 2018-04-06 DIAGNOSIS — I10 Essential (primary) hypertension: Secondary | ICD-10-CM

## 2018-04-06 DIAGNOSIS — R0789 Other chest pain: Secondary | ICD-10-CM | POA: Diagnosis not present

## 2018-04-06 NOTE — Progress Notes (Signed)
Cardiology Office Note:    Date:  04/06/2018   ID:  Victoria Padilla, DOB 01-25-1966, MRN [id number]  PCP:  [name], NP-C  Cardiologist:  Mertie Moores, MD  Electrophysiologist:  None   Referring MD: [name], NP-C   Chief Complaint  Patient presents with  . Chest Pain  . Hypertension     Feb. 10, 2020    Victoria Padilla is a 53 y.o. female with a hx of hypertension, hyperlipidemia.  We are asked to see her today by [name], NP-C for further evaluation of some chronic chest pain.  Has had chest soreness , off and on for years.   Seems to have worsened several weeks ago Does not exercise Works on computers all day  - no activity at work .  Not pleuretic, not related to eating or drinking. Not positional  May last 30 or more minutes.  Can last for 1 day   Pains are not related to walking into grocery store  Not related to climbing stairs.   Not related to shortness or breath , syncope .  No diaphoresis   Avoids salt  ,  Avoids processed foods.   Past Medical History:  Diagnosis Date  . Acid reflux   . Anemia   . Arthritis   . Cervical dysplasia   . Diabetes mellitus   . Heart murmur   . Hyperlipidemia   . Hypertension   . LBP (low back pain)   . Obesity   . OSA (obstructive sleep apnea) 03/22/2018   New diagnosis. Mild.   . Sarcoidosis   . Vitamin D deficiency     Past Surgical History:  Procedure Laterality Date  . BUNIONECTOMY    . CHOLECYSTECTOMY    . COLPOSCOPY    . GYNECOLOGIC CRYOSURGERY    . KNEE ARTHROSCOPY    . TUBAL LIGATION      Current Medications: Current Meds  Medication Sig  . albuterol (PROVENTIL HFA;VENTOLIN HFA) 108 (90 Base) MCG/ACT inhaler Inhale 2 puffs into the lungs every 6 (six) hours as needed for wheezing or shortness of breath.  Marland Kitchen amLODipine (NORVASC) 5 MG tablet Take 1 tablet (5 mg total) by mouth daily.  Marland Kitchen aspirin 81 MG tablet Take 1 tablet (81 mg total) by mouth daily. Reported on 08/08/2015  .  B-D ULTRAFINE III SHORT PEN 31G X 8 MM MISC   . benazepril (LOTENSIN) 40 MG tablet TAKE 1 TABLET BY MOUTH EVERY DAY  . cetirizine (ZYRTEC) 10 MG tablet Take 1 tablet (10 mg total) by mouth daily.  . cholecalciferol (VITAMIN D) 1000 units tablet Take 2,000 Units by mouth daily.  . fluticasone (FLONASE) 50 MCG/ACT nasal spray Place 2 sprays into both nostrils as needed for allergies or rhinitis.  Marland Kitchen glucose blood (ONETOUCH VERIO) test strip 1 each by Other route 2 (two) times daily. And lancets 2/day  . hydrochlorothiazide (HYDRODIURIL) 25 MG tablet TAKE 1 TABLET BY MOUTH EVERY DAY  . Insulin Glargine (BASAGLAR KWIKPEN) 100 UNIT/ML SOPN Inject 0.35 mLs (35 Units total) into the skin every morning. And pen needles 1/day  . ipratropium (ATROVENT) 0.06 % nasal spray Place 2 sprays into both nostrils 4 (four) times daily.  . metFORMIN (GLUCOPHAGE-XR) 500 MG 24 hr tablet Take 1 tablet (500 mg total) by mouth 2 (two) times daily.  . Multiple Vitamins-Minerals (MULTIVITAMIN PO) Take 2 capsules by mouth 2 (two) times daily. Reported on 03/10/2015  . Olopatadine HCl 0.2 % SOLN Apply  1 drop to eye daily.  . Omega-3 Fatty Acids (FISH OIL PO) Take 1 capsule by mouth daily. Reported on 08/08/2015  . ONETOUCH DELICA LANCETS 63Z MISC Test twice a day. Pt uses a one touch verio flex meter  . simvastatin (ZOCOR) 20 MG tablet TAKE 1 TABLET BY MOUTH EVERYDAY AT BEDTIME   Current Facility-Administered Medications for the 04/06/18 encounter (Office Visit) with Emelina Hinch, Wonda Cheng, MD  Medication  . 0.9 %  sodium chloride infusion     Allergies:   Naprosyn [naproxen]   Social History   Socioeconomic History  . Marital status: Single    Spouse name: Not on file  . Number of children: 3  . Years of education: Not on file  . Highest education level: Not on file  Occupational History    Employer: [employer]  . Financial resource strain: Not on file  . Food insecurity:    Worry: Not on file     Inability: Not on file  . Transportation needs:    Medical: Not on file    Non-medical: Not on file  Tobacco Use  . Smoking status: Never Smoker  . Smokeless tobacco: Never Used  Substance and Sexual Activity  . Alcohol use: No  . Drug use: No  . Sexual activity: Never    Birth control/protection: Surgical  Lifestyle  . Physical activity:    Days per week: Not on file    Minutes per session: Not on file  . Stress: Not on file  Relationships  . Social connections:    Talks on phone: Not on file    Gets together: Not on file    Attends religious service: Not on file    Active member of club or organization: Not on file    Attends meetings of clubs or organizations: Not on file    Relationship status: Not on file  Other Topics Concern  . Not on file  Social History Narrative  . Not on file     Family History: The patient's family history includes Cancer (age of onset: 91) in her brother; Colon cancer in her maternal grandfather; Colon polyps in her brother; Diabetes in her brother and brother; Heart disease in her brother; Heart failure in her father; Heart failure (age of onset: 63) in her mother; Hyperlipidemia in her brother and brother; Hypertension in her brother and maternal uncle. There is no history of Stomach cancer, Esophageal cancer, Rectal cancer, or Liver cancer.  ROS:   Please see the history of present illness.     All other systems reviewed and are negative.  EKGs/Labs/Other Studies Reviewed:    The following studies were reviewed today:   EKG:   Feb. 10, 2020 :  NSR at 92.  Normal   Recent Labs: 02/13/2018: ALT 37; BUN 9; Creatinine, Ser 0.71; Hemoglobin 11.7; Platelets 269; Potassium 4.7; Sodium 139; TSH 1.430  Recent Lipid Panel    Component Value Date/Time   CHOL 149 05/08/2017 0909   TRIG 91 05/08/2017 0909   HDL 50 05/08/2017 0909   CHOLHDL 3.0 05/08/2017 0909   CHOLHDL 3.5 12/04/2016 0831   VLDL 27 01/23/2016 0937   LDLCALC 81 05/08/2017  0909   LDLCALC 121 (H) 12/04/2016 0831    Physical Exam:    VS:  BP 138/90   Pulse 94   Ht 5\' 3"  (1.6 m)   Wt 257 lb 6.4 oz (116.8 kg)   SpO2 94%   BMI 45.60 kg/m  Wt Readings from Last 3 Encounters:  04/06/18 257 lb 6.4 oz (116.8 kg)  03/11/18 258 lb 3.2 oz (117.1 kg)  02/13/18 264 lb 12.8 oz (120.1 kg)     GEN:   Middle-aged, morbidly obese female, no acute distress HEENT: Normal NECK: No JVD; No carotid bruits LYMPHATICS: No lymphadenopathy CARDIAC: RRR, no murmurs, rubs, gallops RESPIRATORY:  Clear to auscultation without rales, wheezing or rhonchi  ABDOMEN: Soft, non-tender, non-distended MUSCULOSKELETAL:  No edema; No deformity  SKIN: Warm and dry NEUROLOGIC:  Alert and oriented x 3 PSYCHIATRIC:  Normal affect   ASSESSMENT:    1. Atypical chest pain   2. Essential hypertension    PLAN:    In order of problems listed above:  1. Atypical chest pain: She has atypical chest pains for the past year or so.  These are not related to exertion.  There is not a pleuritic component.    Her EKG is normal.  I have encouraged her to start a walking program.  At this point I do not think that she needs any testing because her chest pains are so atypical.  If she develops any episodes of chest discomfort when she starts her exercise program I recommend that she come back to see [place].  See her on an as-needed basis.   Medication Adjustments/Labs and Tests Ordered: Current medicines are reviewed at length with the patient today.  Concerns regarding medicines are outlined above.  Orders Placed This Encounter  Procedures  . EKG 12-Lead   No orders of the defined types were placed in this encounter.   Patient Instructions  Medication Instructions:  Your physician recommends that you continue on your current medications as directed. Please refer to the Current Medication list given to you today.  If you need a refill on your cardiac medications before your next  appointment, please call your pharmacy.   Lab work: None Ordered   Testing/Procedures: None Ordered   Follow-Up: Dr. Acie Fredrickson would like for you to call our office if you have discomfort or shortness of breath with exericse  At [facility], you and your health needs are our priority.  As part of our continuing mission to provide you with exceptional heart care, we have created designated Provider Care Teams.  These Care Teams include your primary Cardiologist (physician) and Advanced Practice Providers (APPs -  Physician Assistants and Nurse Practitioners) who all work together to provide you with the care you need, when you need it. You will need a follow up appointment in:   as needed.  You may see Mertie Moores, MD or one of the following Advanced Practice Providers on your designated Care Team: Richardson Dopp, PA-C [place], [place] . Daune Perch, NP      Signed, Mertie Moores, MD  04/06/2018 5:46 PM    [facility]

## 2018-04-06 NOTE — Patient Instructions (Signed)
Medication Instructions:  Your physician recommends that you continue on your current medications as directed. Please refer to the Current Medication list given to you today.  If you need a refill on your cardiac medications before your next appointment, please call your pharmacy.   Lab work: None Ordered   Testing/Procedures: None Ordered   Follow-Up: Dr. Acie Fredrickson would like for you to call our office if you have discomfort or shortness of breath with exericse  At [facility], you and your health needs are our priority.  As part of our continuing mission to provide you with exceptional heart care, we have created designated Provider Care Teams.  These Care Teams include your primary Cardiologist (physician) and Advanced Practice Providers (APPs -  Physician Assistants and Nurse Practitioners) who all work together to provide you with the care you need, when you need it. You will need a follow up appointment in:   as needed.  You may see Mertie Moores, MD or one of the following Advanced Practice Providers on your designated Care Team: Richardson Dopp, PA-C [place], [place] . Daune Perch, NP

## 2018-04-19 ENCOUNTER — Other Ambulatory Visit: Payer: Self-pay | Admitting: Family Medicine

## 2018-04-19 DIAGNOSIS — E1159 Type 2 diabetes mellitus with other circulatory complications: Secondary | ICD-10-CM

## 2018-04-19 DIAGNOSIS — I10 Essential (primary) hypertension: Principal | ICD-10-CM

## 2018-04-19 DIAGNOSIS — I152 Hypertension secondary to endocrine disorders: Secondary | ICD-10-CM

## 2018-05-05 ENCOUNTER — Ambulatory Visit: Payer: 59 | Admitting: Cardiology

## 2018-05-09 ENCOUNTER — Other Ambulatory Visit: Payer: Self-pay | Admitting: Family Medicine

## 2018-05-09 DIAGNOSIS — I10 Essential (primary) hypertension: Secondary | ICD-10-CM

## 2018-05-13 ENCOUNTER — Ambulatory Visit: Payer: 59 | Admitting: Endocrinology

## 2018-06-05 ENCOUNTER — Other Ambulatory Visit: Payer: Self-pay | Admitting: Family Medicine

## 2018-06-05 DIAGNOSIS — I10 Essential (primary) hypertension: Secondary | ICD-10-CM

## 2018-06-08 MED ORDER — AMLODIPINE BESYLATE 5 MG PO TABS: 5.0000 mg | ORAL_TABLET | Freq: Every day | ORAL | 0 refills | Status: DC

## 2018-06-08 NOTE — Addendum Note (Signed)
Addended by: Minette Headland A on: 06/08/2018 10:47 AM   Modules accepted: Orders

## 2018-06-08 NOTE — Telephone Encounter (Signed)
done

## 2018-07-11 ENCOUNTER — Other Ambulatory Visit: Payer: Self-pay | Admitting: Endocrinology

## 2018-07-12 NOTE — Telephone Encounter (Signed)
Please refill x 1 F/u is due  

## 2018-07-22 ENCOUNTER — Other Ambulatory Visit: Payer: Self-pay | Admitting: Family Medicine

## 2018-07-22 DIAGNOSIS — I10 Essential (primary) hypertension: Secondary | ICD-10-CM

## 2018-07-22 NOTE — Telephone Encounter (Signed)
Pt sees endo °

## 2018-07-23 ENCOUNTER — Other Ambulatory Visit: Payer: Self-pay | Admitting: Family Medicine

## 2018-07-23 DIAGNOSIS — I152 Hypertension secondary to endocrine disorders: Secondary | ICD-10-CM

## 2018-07-23 DIAGNOSIS — E1159 Type 2 diabetes mellitus with other circulatory complications: Secondary | ICD-10-CM

## 2018-07-30 ENCOUNTER — Other Ambulatory Visit: Payer: Self-pay

## 2018-08-03 ENCOUNTER — Ambulatory Visit (INDEPENDENT_AMBULATORY_CARE_PROVIDER_SITE_OTHER): Payer: 59 | Admitting: Endocrinology

## 2018-08-03 ENCOUNTER — Other Ambulatory Visit: Payer: Self-pay

## 2018-08-03 DIAGNOSIS — Z9114 Patient's other noncompliance with medication regimen: Secondary | ICD-10-CM

## 2018-08-03 DIAGNOSIS — E669 Obesity, unspecified: Secondary | ICD-10-CM

## 2018-08-03 DIAGNOSIS — Z794 Long term (current) use of insulin: Secondary | ICD-10-CM | POA: Diagnosis not present

## 2018-08-03 DIAGNOSIS — E119 Type 2 diabetes mellitus without complications: Secondary | ICD-10-CM | POA: Diagnosis not present

## 2018-08-03 DIAGNOSIS — R635 Abnormal weight gain: Secondary | ICD-10-CM

## 2018-08-03 MED ORDER — DAPAGLIFLOZIN PROPANEDIOL 5 MG PO TABS: 5.0000 mg | ORAL_TABLET | Freq: Every day | ORAL | 11 refills | Status: DC

## 2018-08-03 NOTE — Progress Notes (Signed)
Subjective:    Patient ID: Victoria Padilla, female    DOB: Mar 07, 1965, 53 y.o.   MRN: [id number]  HPI Pt returns for f/u of diabetes mellitus: DM type: Insulin-requiring type 2 Dx'ed: 4970 Complications: DR Therapy: insulin since 2019, and 3 oral meds.   GDM: 1998, but DM persisted after the pregnancy.   DKA: never Severe hypoglycemia: never Pancreatitis: never Pancreatic imaging: normal on 2017 [place] Other: she declined multiple daily injections.  she took trulicity for a few months in 2016; chronic abd pain and nausea have limited rx options.  she has thyroid pseudonodule.   Interval history: pt states she feels well in general.  She says she occasionally misses the insulin.  She says cbg varies from 175-200.  She says missing the insulin is the reason for the increased cbg's. In the past, she stopped farxiga, due to fear of side effects.   Past Medical History:  Diagnosis Date  . Acid reflux   . Anemia   . Arthritis   . Cervical dysplasia   . Diabetes mellitus   . Heart murmur   . Hyperlipidemia   . Hypertension   . LBP (low back pain)   . Obesity   . OSA (obstructive sleep apnea) 03/22/2018   New diagnosis. Mild.   . Sarcoidosis   . Vitamin D deficiency     Past Surgical History:  Procedure Laterality Date  . BUNIONECTOMY    . CHOLECYSTECTOMY    . COLPOSCOPY    . GYNECOLOGIC CRYOSURGERY    . KNEE ARTHROSCOPY    . TUBAL LIGATION      Social History   Socioeconomic History  . Marital status: Single    Spouse name: Not on file  . Number of children: 3  . Years of education: Not on file  . Highest education level: Not on file  Occupational History    Employer: [employer]  . Financial resource strain: Not on file  . Food insecurity:    Worry: Not on file    Inability: Not on file  . Transportation needs:    Medical: Not on file    Non-medical: Not on file  Tobacco Use  . Smoking status: Never Smoker  . Smokeless tobacco: Never Used   Substance and Sexual Activity  . Alcohol use: No  . Drug use: No  . Sexual activity: Never    Birth control/protection: Surgical  Lifestyle  . Physical activity:    Days per week: Not on file    Minutes per session: Not on file  . Stress: Not on file  Relationships  . Social connections:    Talks on phone: Not on file    Gets together: Not on file    Attends religious service: Not on file    Active member of club or organization: Not on file    Attends meetings of clubs or organizations: Not on file    Relationship status: Not on file  . Intimate partner violence:    Fear of current or ex partner: Not on file    Emotionally abused: Not on file    Physically abused: Not on file    Forced sexual activity: Not on file  Other Topics Concern  . Not on file  Social History Narrative  . Not on file    Current Outpatient Medications on File Prior to Visit  Medication Sig Dispense Refill  . albuterol (PROVENTIL HFA;VENTOLIN HFA) 108 (90 Base) MCG/ACT inhaler Inhale 2  puffs into the lungs every 6 (six) hours as needed for wheezing or shortness of breath. 1 Inhaler 0  . amLODipine (NORVASC) 5 MG tablet TAKE 1 TABLET BY MOUTH EVERY DAY 90 tablet 0  . aspirin 81 MG tablet Take 1 tablet (81 mg total) by mouth daily. Reported on 08/08/2015 90 tablet 3  . B-D ULTRAFINE III SHORT PEN 31G X 8 MM MISC     . benazepril (LOTENSIN) 40 MG tablet TAKE 1 TABLET BY MOUTH EVERY DAY 90 tablet 0  . cetirizine (ZYRTEC) 10 MG tablet Take 1 tablet (10 mg total) by mouth daily. 15 tablet 0  . cholecalciferol (VITAMIN D) 1000 units tablet Take 2,000 Units by mouth daily.    . fluticasone (FLONASE) 50 MCG/ACT nasal spray Place 2 sprays into both nostrils as needed for allergies or rhinitis.    Marland Kitchen glucose blood (ONETOUCH VERIO) test strip 1 each by Other route 2 (two) times daily. And lancets 2/day 200 each 3  . hydrochlorothiazide (HYDRODIURIL) 25 MG tablet TAKE 1 TABLET BY MOUTH EVERY DAY 30 tablet 5  .  Insulin Glargine (BASAGLAR KWIKPEN) 100 UNIT/ML SOPN Inject 0.35 mLs (35 Units total) into the skin every morning. And pen needles 1/day 10 pen 3  . ipratropium (ATROVENT) 0.06 % nasal spray Place 2 sprays into both nostrils 4 (four) times daily. 15 mL 0  . metFORMIN (GLUCOPHAGE-XR) 500 MG 24 hr tablet TAKE 4 TABLETS (2,000 MG TOTAL) BY MOUTH DAILY WITH BREAKFAST. 120 tablet 0  . Multiple Vitamins-Minerals (MULTIVITAMIN PO) Take 2 capsules by mouth 2 (two) times daily. Reported on 03/10/2015    . Olopatadine HCl 0.2 % SOLN Apply 1 drop to eye daily. 2.5 mL 0  . Omega-3 Fatty Acids (FISH OIL PO) Take 1 capsule by mouth daily. Reported on 08/08/2015    . ONETOUCH DELICA LANCETS 16X MISC Test twice a day. Pt uses a one touch verio flex meter 100 each 5  . simvastatin (ZOCOR) 20 MG tablet TAKE 1 TABLET BY MOUTH EVERYDAY AT BEDTIME 90 tablet 1   Current Facility-Administered Medications on File Prior to Visit  Medication Dose Route Frequency Provider Last Rate Last Dose  . 0.9 %  sodium chloride infusion  500 mL Intravenous Continuous Armbruster, Carlota Raspberry, MD        Allergies  Allergen Reactions  . Naprosyn [Naproxen] Nausea Only    Family History  Problem Relation Age of Onset  . Heart failure Mother 37       chf  . Heart failure Father   . Cancer Brother 69       prostate  . Hyperlipidemia Brother   . Hypertension Brother   . Diabetes Brother   . Hypertension Maternal Uncle   . Hyperlipidemia Brother   . Diabetes Brother   . Colon cancer Maternal Grandfather   . Colon polyps Brother   . Heart disease Brother   . Stomach cancer Neg Hx   . Esophageal cancer Neg Hx   . Rectal cancer Neg Hx   . Liver cancer Neg Hx     There were no vitals taken for this visit.   Review of Systems She denies hypoglycemia.  She has gained weight.      Objective:   Physical Exam    Lab Results  Component Value Date   CREATININE 0.71 02/13/2018   BUN 9 02/13/2018   NA 139 02/13/2018   K  4.7 02/13/2018   CL 100 02/13/2018   CO2 24  02/13/2018      Assessment & Plan:  Insulin-requiring type 2 DM: we discussed.  she declines to increase insulin.  Obesity: worse Noncompliance with insulin: this is increasing glucose.   Please see a weight loss specialist.  you will receive a phone call, about a day and time for an appointment I have sent a prescription to your pharmacy, to add farxiga Please come back for a follow-up appointment in 1-2 months.

## 2018-08-15 ENCOUNTER — Inpatient Hospital Stay (HOSPITAL_COMMUNITY)
Admission: EM | Admit: 2018-08-15 | Discharge: 2018-08-18 | DRG: 175 | Disposition: A | Discharge status: Home Health | Payer: 59 | Attending: Internal Medicine | Admitting: Internal Medicine

## 2018-08-15 ENCOUNTER — Emergency Department (HOSPITAL_COMMUNITY): Payer: 59

## 2018-08-15 ENCOUNTER — Encounter (HOSPITAL_COMMUNITY): Payer: Self-pay | Admitting: Emergency Medicine

## 2018-08-15 ENCOUNTER — Other Ambulatory Visit: Payer: Self-pay

## 2018-08-15 DIAGNOSIS — E669 Obesity, unspecified: Secondary | ICD-10-CM | POA: Diagnosis present

## 2018-08-15 DIAGNOSIS — D869 Sarcoidosis, unspecified: Secondary | ICD-10-CM | POA: Diagnosis present

## 2018-08-15 DIAGNOSIS — I10 Essential (primary) hypertension: Secondary | ICD-10-CM | POA: Diagnosis present

## 2018-08-15 DIAGNOSIS — Z6841 Body Mass Index (BMI) 40.0 and over, adult: Secondary | ICD-10-CM

## 2018-08-15 DIAGNOSIS — G4733 Obstructive sleep apnea (adult) (pediatric): Secondary | ICD-10-CM | POA: Diagnosis present

## 2018-08-15 DIAGNOSIS — I82433 Acute embolism and thrombosis of popliteal vein, bilateral: Secondary | ICD-10-CM | POA: Diagnosis present

## 2018-08-15 DIAGNOSIS — Z20828 Contact with and (suspected) exposure to other viral communicable diseases: Secondary | ICD-10-CM | POA: Diagnosis present

## 2018-08-15 DIAGNOSIS — I824Y3 Acute embolism and thrombosis of unspecified deep veins of proximal lower extremity, bilateral: Secondary | ICD-10-CM | POA: Diagnosis present

## 2018-08-15 DIAGNOSIS — Z7982 Long term (current) use of aspirin: Secondary | ICD-10-CM

## 2018-08-15 DIAGNOSIS — I2609 Other pulmonary embolism with acute cor pulmonale: Secondary | ICD-10-CM | POA: Diagnosis not present

## 2018-08-15 DIAGNOSIS — Z833 Family history of diabetes mellitus: Secondary | ICD-10-CM

## 2018-08-15 DIAGNOSIS — E1169 Type 2 diabetes mellitus with other specified complication: Secondary | ICD-10-CM | POA: Diagnosis present

## 2018-08-15 DIAGNOSIS — E785 Hyperlipidemia, unspecified: Secondary | ICD-10-CM | POA: Diagnosis present

## 2018-08-15 DIAGNOSIS — K219 Gastro-esophageal reflux disease without esophagitis: Secondary | ICD-10-CM | POA: Diagnosis present

## 2018-08-15 DIAGNOSIS — J9601 Acute respiratory failure with hypoxia: Secondary | ICD-10-CM | POA: Diagnosis present

## 2018-08-15 DIAGNOSIS — I82413 Acute embolism and thrombosis of femoral vein, bilateral: Secondary | ICD-10-CM | POA: Diagnosis present

## 2018-08-15 DIAGNOSIS — I2699 Other pulmonary embolism without acute cor pulmonale: Secondary | ICD-10-CM | POA: Diagnosis present

## 2018-08-15 DIAGNOSIS — I82453 Acute embolism and thrombosis of peroneal vein, bilateral: Secondary | ICD-10-CM | POA: Diagnosis present

## 2018-08-15 DIAGNOSIS — E1165 Type 2 diabetes mellitus with hyperglycemia: Secondary | ICD-10-CM | POA: Diagnosis present

## 2018-08-15 DIAGNOSIS — Z794 Long term (current) use of insulin: Secondary | ICD-10-CM

## 2018-08-15 DIAGNOSIS — E559 Vitamin D deficiency, unspecified: Secondary | ICD-10-CM | POA: Diagnosis present

## 2018-08-15 DIAGNOSIS — IMO0002 Reserved for concepts with insufficient information to code with codable children: Secondary | ICD-10-CM

## 2018-08-15 DIAGNOSIS — I82443 Acute embolism and thrombosis of tibial vein, bilateral: Secondary | ICD-10-CM | POA: Diagnosis present

## 2018-08-15 DIAGNOSIS — Z8249 Family history of ischemic heart disease and other diseases of the circulatory system: Secondary | ICD-10-CM | POA: Diagnosis not present

## 2018-08-15 DIAGNOSIS — I361 Nonrheumatic tricuspid (valve) insufficiency: Secondary | ICD-10-CM | POA: Diagnosis not present

## 2018-08-15 LAB — TSH: TSH: 0.955 u[IU]/mL (ref 0.350–4.500)

## 2018-08-15 LAB — POCT I-STAT EG7
Acid-Base Excess: 1 mmol/L (ref 0.0–2.0)
Bicarbonate: 27.6 mmol/L (ref 20.0–28.0)
Calcium, Ion: 1.17 mmol/L (ref 1.15–1.40)
HCT: 43 % (ref 36.0–46.0)
Hemoglobin: 14.6 g/dL (ref 12.0–15.0)
O2 Saturation: 100 %
Potassium: 4.3 mmol/L (ref 3.5–5.1)
Sodium: 139 mmol/L (ref 135–145)
TCO2: 29 mmol/L (ref 22–32)
pCO2, Ven: 52.8 mmHg (ref 44.0–60.0)
pH, Ven: 7.326 (ref 7.250–7.430)
pO2, Ven: 206 mmHg — ABNORMAL HIGH (ref 32.0–45.0)

## 2018-08-15 LAB — COMPREHENSIVE METABOLIC PANEL
ALT: 122 U/L — ABNORMAL HIGH (ref 0–44)
AST: 164 U/L — ABNORMAL HIGH (ref 15–41)
Albumin: 3.5 g/dL (ref 3.5–5.0)
Alkaline Phosphatase: 130 U/L — ABNORMAL HIGH (ref 38–126)
Anion gap: 15 (ref 5–15)
BUN: 12 mg/dL (ref 6–20)
CO2: 20 mmol/L — ABNORMAL LOW (ref 22–32)
Calcium: 9.2 mg/dL (ref 8.9–10.3)
Chloride: 105 mmol/L (ref 98–111)
Creatinine, Ser: 0.77 mg/dL (ref 0.44–1.00)
GFR calc Af Amer: >60 mL/min (ref >60)
GFR calc non Af Amer: >60 mL/min (ref >60)
Glucose, Bld: 370 mg/dL — ABNORMAL HIGH (ref 70–99)
Potassium: 3.7 mmol/L (ref 3.5–5.1)
Sodium: 140 mmol/L (ref 135–145)
Total Bilirubin: 0.5 mg/dL (ref 0.3–1.2)
Total Protein: 7.6 g/dL (ref 6.5–8.1)

## 2018-08-15 LAB — GLUCOSE, CAPILLARY: Glucose-Capillary: 314 mg/dL — ABNORMAL HIGH (ref 70–99)

## 2018-08-15 LAB — CBC WITH DIFFERENTIAL/PLATELET
Abs Immature Granulocytes: 0.05 10*3/uL (ref 0.00–0.07)
Basophils Absolute: 0 10*3/uL (ref 0.0–0.1)
Basophils Relative: 0 %
Eosinophils Absolute: 0 10*3/uL (ref 0.0–0.5)
Eosinophils Relative: 0 %
HCT: 42.1 % (ref 36.0–46.0)
Hemoglobin: 12.5 g/dL (ref 12.0–15.0)
Immature Granulocytes: 0 %
Lymphocytes Relative: 31 %
Lymphs Abs: 3.8 10*3/uL (ref 0.7–4.0)
MCH: 22.4 pg — ABNORMAL LOW (ref 26.0–34.0)
MCHC: 29.7 g/dL — ABNORMAL LOW (ref 30.0–36.0)
MCV: 75.6 fL — ABNORMAL LOW (ref 80.0–100.0)
Monocytes Absolute: 0.5 10*3/uL (ref 0.1–1.0)
Monocytes Relative: 4 %
Neutro Abs: 7.8 10*3/uL — ABNORMAL HIGH (ref 1.7–7.7)
Neutrophils Relative %: 65 %
Platelets: 167 10*3/uL (ref 150–400)
RBC: 5.57 MIL/uL — ABNORMAL HIGH (ref 3.87–5.11)
RDW: 15.4 % (ref 11.5–15.5)
WBC: 12.2 10*3/uL — ABNORMAL HIGH (ref 4.0–10.5)
nRBC: 0 % (ref 0.0–0.2)

## 2018-08-15 LAB — PHOSPHORUS: Phosphorus: 3.8 mg/dL (ref 2.5–4.6)

## 2018-08-15 LAB — CBG MONITORING, ED: Glucose-Capillary: 349 mg/dL — ABNORMAL HIGH (ref 70–99)

## 2018-08-15 LAB — MAGNESIUM: Magnesium: 1.7 mg/dL (ref 1.7–2.4)

## 2018-08-15 LAB — ABO/RH: ABO/RH(D): A POS

## 2018-08-15 LAB — PROTIME-INR
INR: 1.1 (ref 0.8–1.2)
Prothrombin Time: 13.7 seconds (ref 11.4–15.2)

## 2018-08-15 LAB — LACTIC ACID, PLASMA
Lactic Acid, Venous: 4.3 mmol/L (ref 0.5–1.9)
Lactic Acid, Venous: 5.4 mmol/L (ref 0.5–1.9)

## 2018-08-15 LAB — TYPE AND SCREEN
ABO/RH(D): A POS
Antibody Screen: NEGATIVE

## 2018-08-15 LAB — D-DIMER, QUANTITATIVE: D-Dimer, Quant: 12.46 ug/mL-FEU — ABNORMAL HIGH (ref 0.00–0.50)

## 2018-08-15 LAB — BRAIN NATRIURETIC PEPTIDE: B Natriuretic Peptide: 9.1 pg/mL (ref 0.0–100.0)

## 2018-08-15 LAB — SARS CORONAVIRUS 2 BY RT PCR (HOSPITAL ORDER, PERFORMED IN ~~LOC~~ HOSPITAL LAB): SARS Coronavirus 2: NEGATIVE

## 2018-08-15 LAB — TROPONIN I: Troponin I: 0.06 ng/mL (ref <0.03)

## 2018-08-15 MED ORDER — IPRATROPIUM-ALBUTEROL 0.5-2.5 (3) MG/3ML IN SOLN
3.0000 mL | Freq: Four times a day (QID) | RESPIRATORY_TRACT | Status: DC
  Administered 2018-08-15 – 2018-08-16 (×4): 3 mL via RESPIRATORY_TRACT
  Filled 2018-08-15 (×5): qty 3

## 2018-08-15 MED ORDER — IOHEXOL 350 MG/ML SOLN
75.0000 mL | Freq: Once | INTRAVENOUS | Status: AC | PRN
  Administered 2018-08-15: 16:00:00 75 mL via INTRAVENOUS

## 2018-08-15 MED ORDER — SIMVASTATIN 20 MG PO TABS
20.0000 mg | ORAL_TABLET | Freq: Every day | ORAL | Status: DC
  Administered 2018-08-15 – 2018-08-17 (×3): 20 mg via ORAL
  Filled 2018-08-15 (×3): qty 1

## 2018-08-15 MED ORDER — INSULIN ASPART 100 UNIT/ML ~~LOC~~ SOLN
0.0000 [IU] | Freq: Three times a day (TID) | SUBCUTANEOUS | Status: DC
  Administered 2018-08-16: 7 [IU] via SUBCUTANEOUS
  Administered 2018-08-16: 07:00:00 5 [IU] via SUBCUTANEOUS
  Administered 2018-08-16: 17:00:00 3 [IU] via SUBCUTANEOUS
  Administered 2018-08-17: 02:00:00 2 [IU] via SUBCUTANEOUS
  Administered 2018-08-17: 17:00:00 5 [IU] via SUBCUTANEOUS
  Administered 2018-08-17 – 2018-08-18 (×2): 3 [IU] via SUBCUTANEOUS
  Administered 2018-08-18: 2 [IU] via SUBCUTANEOUS

## 2018-08-15 MED ORDER — SODIUM CHLORIDE 0.9% FLUSH: 3.0000 mL | INTRAVENOUS | Status: DC | PRN

## 2018-08-15 MED ORDER — ONDANSETRON HCL 4 MG/2ML IJ SOLN
4.0000 mg | Freq: Once | INTRAMUSCULAR | Status: AC
  Administered 2018-08-15: 18:00:00 4 mg via INTRAVENOUS
  Filled 2018-08-15: qty 2

## 2018-08-15 MED ORDER — ASPIRIN 81 MG PO CHEW
81.0000 mg | CHEWABLE_TABLET | Freq: Every day | ORAL | Status: DC
  Administered 2018-08-15: 81 mg via ORAL
  Filled 2018-08-15: qty 1

## 2018-08-15 MED ORDER — HEPARIN BOLUS VIA INFUSION
4100.0000 [IU] | Freq: Once | INTRAVENOUS | Status: AC
  Administered 2018-08-15: 4100 [IU] via INTRAVENOUS
  Filled 2018-08-15: qty 4100

## 2018-08-15 MED ORDER — HEPARIN (PORCINE) 25000 UT/250ML-% IV SOLN
1050.0000 [IU]/h | INTRAVENOUS | Status: DC
  Administered 2018-08-15 – 2018-08-16 (×2): 1300 [IU]/h via INTRAVENOUS
  Administered 2018-08-17: 12:00:00 1050 [IU]/h via INTRAVENOUS
  Filled 2018-08-15 (×3): qty 250

## 2018-08-15 MED ORDER — ZOLPIDEM TARTRATE 5 MG PO TABS: 5.0000 mg | ORAL_TABLET | Freq: Every evening | ORAL | Status: DC | PRN

## 2018-08-15 MED ORDER — ACETAMINOPHEN 325 MG PO TABS: 650.0000 mg | ORAL_TABLET | Freq: Four times a day (QID) | ORAL | Status: DC | PRN

## 2018-08-15 MED ORDER — ONDANSETRON HCL 4 MG/2ML IJ SOLN
4.0000 mg | Freq: Once | INTRAMUSCULAR | Status: AC
  Administered 2018-08-15: 15:00:00 4 mg via INTRAVENOUS

## 2018-08-15 MED ORDER — ONDANSETRON HCL 4 MG/2ML IJ SOLN
INTRAMUSCULAR | Status: AC
  Filled 2018-08-15: qty 2

## 2018-08-15 MED ORDER — SODIUM CHLORIDE 0.9 % IV SOLN
1000.0000 mL | INTRAVENOUS | Status: DC
  Administered 2018-08-15: 15:00:00 1000 mL via INTRAVENOUS

## 2018-08-15 MED ORDER — SODIUM CHLORIDE 0.9 % IV BOLUS
1000.0000 mL | Freq: Once | INTRAVENOUS | Status: AC
  Administered 2018-08-15: 1000 mL via INTRAVENOUS

## 2018-08-15 MED ORDER — SODIUM CHLORIDE 0.9% FLUSH
3.0000 mL | Freq: Two times a day (BID) | INTRAVENOUS | Status: DC
  Administered 2018-08-15 – 2018-08-18 (×6): 3 mL via INTRAVENOUS

## 2018-08-15 MED ORDER — SODIUM CHLORIDE 0.9 % IV BOLUS (SEPSIS)
250.0000 mL | Freq: Once | INTRAVENOUS | Status: AC
  Administered 2018-08-15: 250 mL via INTRAVENOUS

## 2018-08-15 MED ORDER — BENAZEPRIL HCL 40 MG PO TABS
40.0000 mg | ORAL_TABLET | Freq: Every day | ORAL | Status: DC
  Administered 2018-08-15: 22:00:00 40 mg via ORAL
  Filled 2018-08-15: qty 1

## 2018-08-15 MED ORDER — ACETAMINOPHEN 650 MG RE SUPP: 650.0000 mg | Freq: Four times a day (QID) | RECTAL | Status: DC | PRN

## 2018-08-15 MED ORDER — INSULIN ASPART 100 UNIT/ML ~~LOC~~ SOLN
0.0000 [IU] | Freq: Every day | SUBCUTANEOUS | Status: DC
  Administered 2018-08-15: 4 [IU] via SUBCUTANEOUS
  Administered 2018-08-17: 2 [IU] via SUBCUTANEOUS

## 2018-08-15 MED ORDER — AMLODIPINE BESYLATE 5 MG PO TABS
5.0000 mg | ORAL_TABLET | Freq: Every day | ORAL | Status: DC
  Administered 2018-08-15: 22:00:00 5 mg via ORAL
  Filled 2018-08-15: qty 1

## 2018-08-15 MED ORDER — HYDROCHLOROTHIAZIDE 25 MG PO TABS
25.0000 mg | ORAL_TABLET | Freq: Every day | ORAL | Status: DC
  Administered 2018-08-15: 25 mg via ORAL
  Filled 2018-08-15: qty 1

## 2018-08-15 MED ORDER — SODIUM CHLORIDE 0.9 % IV SOLN
250.0000 mL | INTRAVENOUS | Status: DC | PRN
  Administered 2018-08-17: 05:00:00 10 mL via INTRAVENOUS

## 2018-08-15 NOTE — ED Notes (Signed)
Patient transported to CT 

## 2018-08-15 NOTE — ED Notes (Signed)
ED TO INPATIENT HANDOFF REPORT  ED Nurse Name and Phone #: Sherrine Maples [phone]  S Name/Age/Gender Audery Amel 53 y.o. female Room/Bed: 017C/017C  Code Status   Code Status: Not on file  Home/SNF/Other Home Patient oriented to: self, place, time and situation Is this baseline? Yes   Triage Complete: Triage complete  Chief Complaint syncope  Triage Note [facility]- pt arrives from home. Pt had a witnessed syncopal episode by niece. Pt also has type 2 diabetes. CBG 450 with EMS. Pt states she is compliant with meds. Pt states she feels weak and lethargic. Pt had 1 vomit episode. Pt received 4 mg zofran   BP 170/100 A&O x4.  97% on 6L. No O2 use at home    Allergies Allergies  Allergen Reactions  . Naprosyn [Naproxen] Nausea Only    Level of Care/Admitting Diagnosis ED Disposition    ED Disposition Condition [facility]: [place] [[id number]]  Level of Care: Progressive [102]  Covid Evaluation: N/A  Diagnosis: Pulmonary embolism ([place]) [224825]  Admitting Physician: Merton Border Marshal.Browner  Attending Physician: HIJAZI, [place]  Estimated length of stay: past midnight tomorrow  Certification:: I certify this patient will need inpatient services for at least 2 midnights  PT Class (Do Not Modify): Inpatient [101]  PT Acc Code (Do Not Modify): Private [1]       B Medical/Surgery History Past Medical History:  Diagnosis Date  . Acid reflux   . Anemia   . Arthritis   . Cervical dysplasia   . Diabetes mellitus   . Heart murmur   . Hyperlipidemia   . Hypertension   . LBP (low back pain)   . Obesity   . OSA (obstructive sleep apnea) 03/22/2018   New diagnosis. Mild.   . Sarcoidosis   . Vitamin D deficiency    Past Surgical History:  Procedure Laterality Date  . BUNIONECTOMY    . CHOLECYSTECTOMY    . COLPOSCOPY    . GYNECOLOGIC CRYOSURGERY    . KNEE ARTHROSCOPY    . TUBAL LIGATION       A IV  Location/Drains/Wounds Patient Lines/Drains/Airways Status   Active Line/Drains/Airways    Name:   Placement date:   Placement time:   Site:   Days:   Peripheral IV 08/15/18 Right Antecubital   08/15/18    1518    Antecubital   less than 1   Peripheral IV 08/15/18 Left Antecubital   08/15/18    1607    Antecubital   less than 1          Intake/Output Last 24 hours No intake or output data in the 24 hours ending 08/15/18 1832  Labs/Imaging Results for orders placed or performed during the hospital encounter of 08/15/18 (from the past 48 hour(s))  CBG monitoring, ED     Status: Abnormal   Collection Time: 08/15/18  1:21 PM  Result Value Ref Range   Glucose-Capillary 349 (H) 70 - 99 mg/dL   Comment 1 Notify RN    Comment 2 Document in Chart   Comprehensive metabolic panel     Status: Abnormal   Collection Time: 08/15/18  2:00 PM  Result Value Ref Range   Sodium 140 135 - 145 mmol/L   Potassium 3.7 3.5 - 5.1 mmol/L   Chloride 105 98 - 111 mmol/L   CO2 20 (L) 22 - 32 mmol/L   Glucose, Bld 370 (H) 70 - 99 mg/dL   BUN  12 6 - 20 mg/dL   Creatinine, Ser 0.77 0.44 - 1.00 mg/dL   Calcium 9.2 8.9 - 10.3 mg/dL   Total Protein 7.6 6.5 - 8.1 g/dL   Albumin 3.5 3.5 - 5.0 g/dL   AST 164 (H) 15 - 41 U/L   ALT 122 (H) 0 - 44 U/L   Alkaline Phosphatase 130 (H) 38 - 126 U/L   Total Bilirubin 0.5 0.3 - 1.2 mg/dL   GFR calc non Af Amer >60 >60 mL/min   GFR calc Af Amer >60 >60 mL/min   Anion gap 15 5 - 15    Comment: Performed at [facility], [place] [address], [place], [place] [address]  Troponin I - Once     Status: Abnormal   Collection Time: 08/15/18  2:00 PM  Result Value Ref Range   Troponin I 0.06 (HH) <0.03 ng/mL    Comment: CRITICAL RESULT CALLED TO, READ BACK BY AND VERIFIED WITH: Amel Kitch APATH RN AT 8413 08/15/2018 BY [facility] Performed at [facility], [address], [place], [place] [address]   CBC with Differential     Status: Abnormal   Collection Time: 08/15/18   2:00 PM  Result Value Ref Range   WBC 12.2 (H) 4.0 - 10.5 K/uL   RBC 5.57 (H) 3.87 - 5.11 MIL/uL   Hemoglobin 12.5 12.0 - 15.0 g/dL   HCT 42.1 36.0 - 46.0 %   MCV 75.6 (L) 80.0 - 100.0 fL   MCH 22.4 (L) 26.0 - 34.0 pg   MCHC 29.7 (L) 30.0 - 36.0 g/dL   RDW 15.4 11.5 - 15.5 %   Platelets 167 150 - 400 K/uL   nRBC 0.0 0.0 - 0.2 %   Neutrophils Relative % 65 %   Neutro Abs 7.8 (H) 1.7 - 7.7 K/uL   Lymphocytes Relative 31 %   Lymphs Abs 3.8 0.7 - 4.0 K/uL   Monocytes Relative 4 %   Monocytes Absolute 0.5 0.1 - 1.0 K/uL   Eosinophils Relative 0 %   Eosinophils Absolute 0.0 0.0 - 0.5 K/uL   Basophils Relative 0 %   Basophils Absolute 0.0 0.0 - 0.1 K/uL   Immature Granulocytes 0 %   Abs Immature Granulocytes 0.05 0.00 - 0.07 K/uL    Comment: Performed at [facility], [place] [address], [place], [place] [address]  D-dimer, quantitative     Status: Abnormal   Collection Time: 08/15/18  2:00 PM  Result Value Ref Range   D-Dimer, Quant 12.46 (H) 0.00 - 0.50 ug/mL-FEU    Comment: (NOTE) At the manufacturer cut-off of 0.50 ug/mL FEU, this assay has been documented to exclude PE with a sensitivity and negative predictive value of 97 to 99%.  At this time, this assay has not been approved by the FDA to exclude DVT/VTE. Results should be correlated with clinical presentation. Performed at [facility], [place] [address], [place], [place] [address]   Protime-INR     Status: None   Collection Time: 08/15/18  2:00 PM  Result Value Ref Range   Prothrombin Time 13.7 11.4 - 15.2 seconds   INR 1.1 0.8 - 1.2    Comment: (NOTE) INR goal varies based on device and disease states. Performed at [facility], [place] [address], [place], [place] [address]   Magnesium     Status: None   Collection Time: 08/15/18  2:00 PM  Result Value Ref Range   Magnesium 1.7 1.7 -  2.4 mg/dL    Comment: Performed at [facility], [place] [address] [place], [place] [address]  Phosphorus      Status: None   Collection Time: 08/15/18  2:00 PM  Result Value Ref Range   Phosphorus 3.8 2.5 - 4.6 mg/dL    Comment: Performed at [facility], [place] [address] [place], [place] [address]  Brain natriuretic peptide     Status: None   Collection Time: 08/15/18  2:00 PM  Result Value Ref Range   B Natriuretic Peptide 9.1 0.0 - 100.0 pg/mL    Comment: Performed at [place] [address] [place], [place] [address]  SARS Coronavirus 2 (CEPHEID - Performed in [place] hospital lab), Hosp Order     Status: None   Collection Time: 08/15/18  2:03 PM   Specimen: Nasopharyngeal Swab  Result Value Ref Range   SARS Coronavirus 2 NEGATIVE NEGATIVE    Comment: (NOTE) If result is NEGATIVE SARS-CoV-2 target nucleic acids are NOT DETECTED. The SARS-CoV-2 RNA is generally detectable in upper and lower  respiratory specimens during the acute phase of infection. The lowest  concentration of SARS-CoV-2 viral copies this assay can detect is 250  copies / mL. A negative result does not preclude SARS-CoV-2 infection  and should not be used as the sole basis for treatment or other  patient management decisions.  A negative result may occur with  improper specimen collection / handling, submission of specimen other  than nasopharyngeal swab, presence of viral mutation(s) within the  areas targeted by this assay, and inadequate number of viral copies  (<250 copies / mL). A negative result must be combined with clinical  observations, patient history, and epidemiological information. If result is POSITIVE SARS-CoV-2 target nucleic acids are DETECTED. The SARS-CoV-2 RNA is generally detectable in upper and lower  respiratory specimens dur ing the acute phase of infection.  Positive  results are indicative of active infection with SARS-CoV-2.  Clinical  correlation with patient history and other diagnostic information is  necessary to determine patient infection status.  Positive results  do  not rule out bacterial infection or co-infection with other viruses. If result is PRESUMPTIVE POSTIVE SARS-CoV-2 nucleic acids MAY BE PRESENT.   A presumptive positive result was obtained on the submitted specimen  and confirmed on repeat testing.  While 2019 novel coronavirus  (SARS-CoV-2) nucleic acids may be present in the submitted sample  additional confirmatory testing may be necessary for epidemiological  and / or clinical management purposes  to differentiate between  SARS-CoV-2 and other Sarbecovirus currently known to infect humans.  If clinically indicated additional testing with an alternate test  methodology [phone]) is advised. The SARS-CoV-2 RNA is generally  detectable in upper and lower respiratory sp ecimens during the acute  phase of infection. The expected result is Negative. Fact Sheet for Patients:  StrictlyIdeas.no Fact Sheet for Healthcare Providers: BankingDealers.co.za This test is not yet approved or cleared by the [place] FDA and has been authorized for detection and/or diagnosis of SARS-CoV-2 by FDA under an Emergency Use Authorization (EUA).  This EUA will remain in effect (meaning this test can be used) for the duration of the COVID-19 declaration under Section 564(b)(1) of the Act, 21 U.S.C. section 360bbb-3(b)(1), unless the authorization is terminated or revoked sooner. Performed at [facility], [place] [address] [place], [place] [address]   Type and screen [place]     Status: None  Collection Time: 08/15/18  2:30 PM  Result Value Ref Range   ABO/RH(D) A POS    Antibody Screen NEG    Sample Expiration      08/18/2018,2359 Performed at [facility], [place] [address], [place], [place] [address]   ABO/Rh     Status: None   Collection Time: 08/15/18  2:30 PM  Result Value Ref Range   ABO/RH(D)      A POS Performed at [place] [address],  [place], [place] [address]   Lactic acid, plasma     Status: Abnormal   Collection Time: 08/15/18  2:32 PM  Result Value Ref Range   Lactic Acid, Venous 4.3 (HH) 0.5 - 1.9 mmol/L    Comment: CRITICAL RESULT CALLED TO, READ BACK BY AND VERIFIED WITH: Luciano Cinquemani APATH RN AT 1527 08/15/2018 BY [facility] Performed at [facility], [address], [place], [place] [address]   TSH     Status: None   Collection Time: 08/15/18  2:32 PM  Result Value Ref Range   TSH 0.955 0.350 - 4.500 uIU/mL    Comment: Performed by a 3rd Generation assay with a functional sensitivity of <=0.01 uIU/mL. Performed at [facility], [place] [address], [place], [place] [address]   POCT I-Stat EG7     Status: Abnormal   Collection Time: 08/15/18  2:56 PM  Result Value Ref Range   pH, Ven 7.326 7.250 - 7.430   pCO2, Ven 52.8 44.0 - 60.0 mmHg   pO2, Ven 206.0 (H) 32.0 - 45.0 mmHg   Bicarbonate 27.6 20.0 - 28.0 mmol/L   TCO2 29 22 - 32 mmol/L   O2 Saturation 100.0 %   Acid-Base Excess 1.0 0.0 - 2.0 mmol/L   Sodium 139 135 - 145 mmol/L   Potassium 4.3 3.5 - 5.1 mmol/L   Calcium, Ion 1.17 1.15 - 1.40 mmol/L   HCT 43.0 36.0 - 46.0 %   Hemoglobin 14.6 12.0 - 15.0 g/dL   Patient temperature HIDE    Sample type VENOUS   Lactic acid, plasma     Status: Abnormal   Collection Time: 08/15/18  5:32 PM  Result Value Ref Range   Lactic Acid, Venous 5.4 (HH) 0.5 - 1.9 mmol/L    Comment: CRITICAL RESULT CALLED TO, READ BACK BY AND VERIFIED WITH: Thedora Hinders RN AT 1832 08/15/2018 BY [facility] Performed at [facility], [address] [place], [place] [address]    Ct Head Wo Contrast  Result Date: 08/15/2018 CLINICAL DATA:  Witnessed syncopal episode today. EXAM: CT HEAD WITHOUT CONTRAST TECHNIQUE: Contiguous axial images were obtained from the base of the skull through the vertex without intravenous contrast. COMPARISON:  None. FINDINGS: Brain: Ventricles, cisterns and other CSF spaces are normal. There is no  mass, mass effect, shift of midline structures or acute hemorrhage. No evidence of acute infarction. Vascular: No hyperdense vessel or unexpected calcification. Skull: Normal. Negative for fracture or focal lesion. Sinuses/Orbits: No acute finding. Other: None. IMPRESSION: No acute findings. Electronically Signed   By: Marin Olp M.D.   On: 08/15/2018 15:19   Ct Angio Chest Pe W/cm &/or Wo Cm  Result Date: 08/15/2018 CLINICAL DATA:  Chest pain.  Evaluate for pulmonary embolism. EXAM: CT ANGIOGRAPHY CHEST WITH CONTRAST TECHNIQUE: Multidetector CT imaging of the chest was performed using the standard protocol during bolus administration of intravenous contrast. Multiplanar CT image reconstructions and MIPs were obtained to evaluate the vascular anatomy.  CONTRAST:  66mL OMNIPAQUE IOHEXOL 350 MG/ML SOLN COMPARISON:  12/15/2004; chest radiograph-earlier same day FINDINGS: Vascular Findings: There is adequate opacification of the pulmonary arterial system with the main pulmonary artery measuring 410 Hounsfield units. Mixed occlusive and nonocclusive filling defects are seen within segmental and subsegmental pulmonary arteries bilaterally. The main and majority of the right and left pulmonary arteries remain patent without more central filling defect. Normal caliber the main pulmonary artery. While the overall clot burden is deemed moderate volume, there is bowing of the intraventricular septum with abnormal right to left ventricular ratio of 2.0 as could be seen in the setting right-sided heart strain. No evidence of evolving pulmonary infarction Cardiomegaly.  No pericardial effusion. Normal caliber the thoracic aorta. Note is made of an aberrant right subclavian artery which courses posterior to the trachea and the esophagus. Review of the MIP images confirms the above findings. ---------------------------------------------------------------------------------- Nonvascular Findings: Mediastinum/Lymph Nodes:  Scattered partially calcified mediastinal, prevascular and bilateral hilar lymph nodes compatible with known history of sarcoidosis. No bulky mediastinal, hilar axillary lymphadenopathy. Lungs/Pleura: Ill-defined ground-glass within the left upper lobe (represent image 22, series 7), is favored to represent an area of transient atelectasis given patient respiratory artifact. No discrete focal airspace opacities. No pleural effusion or pneumothorax. The central pulmonary airways appear widely patent. No discrete pulmonary nodules given limitation of the examination. Upper abdomen: Limited early arterial phase evaluation of the upper abdomen demonstrates diffuse decreased attenuation of the hepatic parenchyma with nodularity of the hepatic contour suggestive of cirrhosis. The spleen is noted to be atretic. Musculoskeletal: No acute or aggressive osseous abnormalities. Regional soft tissues appear normal. Normal appearance of the thyroid gland. IMPRESSION: 1. Positive for acute bilateral moderate volume pulmonary embolism with CT evidence of right heart strain (RV/LV Ratio = 2.0) consistent with at least submassive (intermediate risk) PE. The presence of right heart strain has been associated with an increased risk of morbidity and mortality. Please activate Code PE by paging [phone]. 2. Decreased attenuation of the hepatic parenchyma with nodularity hepatic contour compatible with hepatic cirrhosis, potentially progressed compared to the 11/2004 examination. 3. Partially calcified mediastinal and bilateral hilar lymph nodes compatible provided history of sarcoidosis. 4. Incidentally noted aberrant right subclavian artery. Critical Value/emergent results were called by telephone at the time of interpretation on 08/15/2018 at 4:54 pm to Dr. Ronnald Nian, who verbally acknowledged these results. Electronically Signed   By: Sandi Mariscal M.D.   On: 08/15/2018 16:56   Dg Chest Port 1 View  Result Date:  08/15/2018 CLINICAL DATA:  Syncope EXAM: PORTABLE CHEST 1 VIEW COMPARISON:  02/13/2018 FINDINGS: Mildly low lung volume. Borderline cardiomegaly. No focal consolidation or effusion. No pneumothorax. IMPRESSION: No active disease.  Low lung volumes. Electronically Signed   By: Donavan Foil M.D.   On: 08/15/2018 15:13    Pending Labs Unresulted Labs (From admission, onward)    Start     Ordered   08/17/18 0500  Heparin level (unfractionated)  Daily,   R     08/15/18 1622   08/15/18 2300  Heparin level (unfractionated)  Once-Timed,   STAT     08/15/18 1622   08/15/18 1344  Urinalysis, Routine w reflex microscopic  ONCE - STAT,   STAT     08/15/18 1344   08/15/18 1344  Urine rapid drug screen (hosp performed)  ONCE - STAT,   STAT     08/15/18 1344   08/15/18 1344  Culture, blood (routine x 2)  BLOOD CULTURE X 2,  STAT     08/15/18 1344   Signed and Held  HIV antibody (Routine Testing)  Once,   R     Signed and Held   Signed and Held  Basic metabolic panel  Tomorrow morning,   R     Signed and Held   Signed and Held  Protime-INR  Tomorrow morning,   R     Signed and Held   Signed and Held  CBC  Tomorrow morning,   R     Signed and Held   Signed and Held  Basic metabolic panel  Tomorrow morning,   R     Signed and Held          Vitals/Pain Today's Vitals   08/15/18 1741 08/15/18 1815 08/15/18 1830 08/15/18 1832  BP: (!) 140/95 (!) 176/113 (!) 143/99 (!) 143/99  Pulse: (!) 136   (!) 140  Resp: (!) 23 (!) 23 19 20   Temp:      TempSrc:      SpO2: 92%   95%  Weight:      Height:      PainSc:        Isolation Precautions No active isolations  Medications Medications  heparin ADULT infusion 100 units/mL (25000 units/262mL sodium chloride 0.45%) (1,300 Units/hr Intravenous New Bag/Given 08/15/18 1645)  sodium chloride 0.9 % bolus 250 mL (0 mLs Intravenous Stopped 08/15/18 1558)  ondansetron (ZOFRAN) injection 4 mg (4 mg Intravenous Given 08/15/18 1430)  sodium chloride 0.9 %  bolus 1,000 mL (1,000 mLs Intravenous New Bag/Given 08/15/18 1607)  heparin bolus via infusion 4,100 Units (4,100 Units Intravenous Bolus from Bag 08/15/18 1647)  iohexol (OMNIPAQUE) 350 MG/ML injection 75 mL (75 mLs Intravenous Contrast Given 08/15/18 1624)  ondansetron (ZOFRAN) injection 4 mg (4 mg Intravenous Given 08/15/18 1745)    Mobility walks Moderate fall risk   Focused Assessments Pulmonary Assessment Handoff:  Lung sounds:   O2 Device: Nasal Cannula O2 Flow Rate (L/min): 2 L/min      R Recommendations: See Admitting Provider Note  Report given to:   Additional Notes:

## 2018-08-15 NOTE — H&P (Signed)
[facility]                                                                                    Patient Demographics  Victoria Padilla, is a 53 y.o. female  CSN: [id number]  MRN: [id number]  DOB - [birth year]  Admit Date - 08/15/2018  Outpatient Primary MD for the patient is [name], NP-C   With History of -  Past Medical History:  Diagnosis Date  . Acid reflux   . Anemia   . Arthritis   . Cervical dysplasia   . Diabetes mellitus   . Heart murmur   . Hyperlipidemia   . Hypertension   . LBP (low back pain)   . Obesity   . OSA (obstructive sleep apnea) 03/22/2018   New diagnosis. Mild.   . Sarcoidosis   . Vitamin D deficiency       Past Surgical History:  Procedure Laterality Date  . BUNIONECTOMY    . CHOLECYSTECTOMY    . COLPOSCOPY    . GYNECOLOGIC CRYOSURGERY    . KNEE ARTHROSCOPY    . TUBAL LIGATION      in for   Shortness of breath  HPI  Victoria Padilla  is a 53 y.o. female, past medical history significant for diabetes mellitus type 2, hyperlipidemia, hypertension and obstructive sleep apnea presenting today with an episode at home with altered mental status.  In the emergency room, patient was found to be tachycardic and altered.  She denied any  palpitations, shortness of breath or headache.  She reports mild chest pressure with nausea and vomiting. CT of the chest was done and and bilateral PEs were noted with RV strain. Patient was not started on lytic therapy due to the fact that she was oxygenating well on 2 L of oxygen and basically asymptomatic . Lung changes due to sarcoidosis were also noted with calcification of mediastinal and bilateral hilar lymph nodes. Echocardiogram was ordered and patient was started on heparin drip in the emergency room and she will be admitted to progressive with close observation due to the CT findings.    Review of Systems    In addition to the HPI above,  No Fever-chills, No Headache, No changes  with Vision or hearing, No problems swallowing food or Liquids, Mild chest pressure No Abdominal pain,  No Blood in stool or Urine, No dysuria, No new skin rashes or bruises, No new joints pains-aches,  No new weakness, tingling, numbness in any extremity, No recent weight gain or loss, No polyuria, polydypsia or polyphagia, No significant Mental Stressors.     Social History Social History   Tobacco Use  . Smoking status: Never Smoker  . Smokeless tobacco: Never Used  Substance Use Topics  . Alcohol use: No     Family History Family History  Problem Relation Age of Onset  . Heart failure Mother 54       chf  . Heart failure Father   . Cancer Brother 70       prostate  . Hyperlipidemia Brother   . Hypertension Brother   . Diabetes Brother   . Hypertension Maternal Uncle   .  Hyperlipidemia Brother   . Diabetes Brother   . Colon cancer Maternal Grandfather   . Colon polyps Brother   . Heart disease Brother   . Stomach cancer Neg Hx   . Esophageal cancer Neg Hx   . Rectal cancer Neg Hx   . Liver cancer Neg Hx      Prior to Admission medications   Medication Sig Start Date End Date Taking? Authorizing Provider  albuterol (PROVENTIL HFA;VENTOLIN HFA) 108 (90 Base) MCG/ACT inhaler Inhale 2 puffs into the lungs every 6 (six) hours as needed for wheezing or shortness of breath. Patient taking differently: Inhale 2 puffs into the lungs every 6 (six) hours as needed for wheezing or shortness of breath (seasonal allergies).  02/08/16  Yes Henson, [name] L, NP-C  amLODipine (NORVASC) 5 MG tablet TAKE 1 TABLET BY MOUTH EVERY DAY Patient taking differently: Take 5 mg by mouth at bedtime.  07/22/18  Yes Henson, [name] L, NP-C  aspirin 81 MG tablet Take 1 tablet (81 mg total) by mouth daily. Reported on 08/08/2015 Patient taking differently: Take 81 mg by mouth at bedtime. Reported on 08/08/2015 08/09/15  Yes Tysinger, Camelia Eng, PA-C  benazepril (LOTENSIN) 40 MG tablet TAKE 1  TABLET BY MOUTH EVERY DAY Patient taking differently: Take 40 mg by mouth at bedtime.  07/23/18  Yes Henson, [name] L, NP-C  cetirizine (ZYRTEC) 10 MG tablet Take 1 tablet (10 mg total) by mouth daily. Patient taking differently: Take 10 mg by mouth at bedtime as needed (seasonal allergies).  06/16/17  Yes Yu, Amy V, PA-C  cholecalciferol (VITAMIN D) 1000 units tablet Take 2,000 Units by mouth at bedtime.    Yes [provider]  fluticasone (FLONASE) 50 MCG/ACT nasal spray Place 2 sprays into both nostrils daily as needed (seasonal allergies).    Yes [provider]  hydrochlorothiazide (HYDRODIURIL) 25 MG tablet TAKE 1 TABLET BY MOUTH EVERY DAY Patient taking differently: Take 25 mg by mouth at bedtime.  02/11/18  Yes Henson, [name] L, NP-C  ibuprofen (ADVIL) 200 MG tablet Take 200 mg by mouth every 6 (six) hours as needed for headache (pain).   Yes [provider]  metFORMIN (GLUCOPHAGE-XR) 500 MG 24 hr tablet TAKE 4 TABLETS (2,000 MG TOTAL) BY MOUTH DAILY WITH BREAKFAST. Patient taking differently: Take 2,000 mg by mouth at bedtime.  07/13/18  Yes Renato Shin, MD  Multiple Vitamin (MULTIVITAMIN WITH MINERALS) TABS tablet Take 1 tablet by mouth at bedtime.   Yes [provider]  Olopatadine HCl 0.2 % SOLN Apply 1 drop to eye daily. Patient taking differently: Place 1 drop into both eyes daily as needed (seasonal allergies).  06/16/17  Yes Yu, Amy V, PA-C  simvastatin (ZOCOR) 20 MG tablet TAKE 1 TABLET BY MOUTH EVERYDAY AT BEDTIME Patient taking differently: Take 20 mg by mouth at bedtime.  11/27/17  Yes Henson, [name] L, NP-C  B-D ULTRAFINE III SHORT PEN 31G X 8 MM MISC  03/27/18   [provider]  dapagliflozin propanediol (FARXIGA) 5 MG TABS tablet Take 5 mg by mouth daily. Patient taking differently: Take 5 mg by mouth at bedtime.  08/03/18   Renato Shin, MD  glucose blood [facility] VERIO) test strip 1 each by Other route 2 (two) times daily. And  lancets 2/day 01/08/18   Renato Shin, MD  Insulin Glargine (BASAGLAR KWIKPEN) 100 UNIT/ML SOPN Inject 0.35 mLs (35 Units total) into the skin every morning. And pen needles 1/day Patient not taking: Reported on 08/15/2018  03/11/18   Renato Shin, MD  ipratropium (ATROVENT) 0.06 % nasal spray Place 2 sprays into both nostrils 4 (four) times daily. Patient not taking: Reported on 08/15/2018 06/16/17   Ok Edwards, PA-C  Omega-3 Fatty Acids (FISH OIL PO) Take 1 capsule by mouth at bedtime. Reported on 08/08/2015    [provider]  Jonetta Speak LANCETS 65B MISC Test twice a day. Pt uses a one touch verio flex meter 12/05/16   Henson, [name] L, NP-C    Allergies  Allergen Reactions  . Naprosyn [Naproxen] Nausea Only    Physical Exam  Vitals  Blood pressure (!) 140/95, pulse (!) 136, temperature 99.3 F (37.4 C), temperature source Rectal, resp. rate (!) 23, height 5\' 3"  (1.6 m), weight 117.9 kg, SpO2 92 %.   General appearance, anxious obese, looks tired HEENT no jaundice or pallor, no facial deviation or oral thrush Neck supple, no neck vein distention Chest decreased breath sounds bilaterally with no wheezing but scattered rhonchi Heart normal S1-S2, no murmurs gallops rubs Abdomen soft obese bowel sounds present no visceromegaly Extremities no clubbing cyanosis but 1+ edema noted Neuro grossly nonfocal patient moving all extremities Skin no rashes or ulcers  Data Review  CBC Recent Labs  Lab 08/15/18 1400 08/15/18 1456  WBC 12.2*  --   HGB 12.5 14.6  HCT 42.1 43.0  PLT 167  --   MCV 75.6*  --   MCH 22.4*  --   MCHC 29.7*  --   RDW 15.4  --   LYMPHSABS 3.8  --   MONOABS 0.5  --   EOSABS 0.0  --   BASOSABS 0.0  --    ------------------------------------------------------------------------------------------------------------------  Chemistries  Recent Labs  Lab 08/15/18 1400 08/15/18 1456  NA 140 139  K 3.7 4.3  CL 105  --   CO2 20*  --   GLUCOSE  370*  --   BUN 12  --   CREATININE 0.77  --   CALCIUM 9.2  --   MG 1.7  --   AST 164*  --   ALT 122*  --   ALKPHOS 130*  --   BILITOT 0.5  --    ------------------------------------------------------------------------------------------------------------------ estimated creatinine clearance is 98.6 mL/min (by C-G formula based on SCr of 0.77 mg/dL). ------------------------------------------------------------------------------------------------------------------ Recent Labs    08/15/18 1432  TSH 0.955     Coagulation profile Recent Labs  Lab 08/15/18 1400  INR 1.1   ------------------------------------------------------------------------------------------------------------------- Recent Labs    08/15/18 1400  DDIMER 12.46*   -------------------------------------------------------------------------------------------------------------------  Cardiac Enzymes Recent Labs  Lab 08/15/18 1400  TROPONINI 0.06*   ------------------------------------------------------------------------------------------------------------------ Invalid input(s): POCBNP   ---------------------------------------------------------------------------------------------------------------  Urinalysis    Component Value Date/Time   COLORURINE YELLOW 09/11/2013 1323   APPEARANCEUR CLEAR 09/11/2013 1323   LABSPEC 1.030 05/08/2017 0921   PHURINE 8.0 09/11/2013 1323   GLUCOSEU NEGATIVE 09/11/2013 1323   HGBUR NEGATIVE 09/11/2013 1323   BILIRUBINUR negative 05/08/2017 0921   BILIRUBINUR n 12/14/2015 1638   KETONESUR negative 05/08/2017 0921   KETONESUR NEGATIVE 09/11/2013 1323   PROTEINUR trace (A) 05/08/2017 0921   PROTEINUR n 12/14/2015 1638   PROTEINUR NEGATIVE 09/11/2013 1323   UROBILINOGEN negative 12/14/2015 1638   UROBILINOGEN 1.0 09/11/2013 1323   NITRITE Negative 05/08/2017 0921   NITRITE n 12/14/2015 1638   NITRITE NEGATIVE 09/11/2013 1323   LEUKOCYTESUR Negative 05/08/2017 0921     ----------------------------------------------------------------------------------------------------------------   Imaging results:   Ct Head Wo Contrast  Result Date: 08/15/2018 CLINICAL  DATA:  Witnessed syncopal episode today. EXAM: CT HEAD WITHOUT CONTRAST TECHNIQUE: Contiguous axial images were obtained from the base of the skull through the vertex without intravenous contrast. COMPARISON:  None. FINDINGS: Brain: Ventricles, cisterns and other CSF spaces are normal. There is no mass, mass effect, shift of midline structures or acute hemorrhage. No evidence of acute infarction. Vascular: No hyperdense vessel or unexpected calcification. Skull: Normal. Negative for fracture or focal lesion. Sinuses/Orbits: No acute finding. Other: None. IMPRESSION: No acute findings. Electronically Signed   By: Marin Olp M.D.   On: 08/15/2018 15:19   Ct Angio Chest Pe W/cm &/or Wo Cm  Result Date: 08/15/2018 CLINICAL DATA:  Chest pain.  Evaluate for pulmonary embolism. EXAM: CT ANGIOGRAPHY CHEST WITH CONTRAST TECHNIQUE: Multidetector CT imaging of the chest was performed using the standard protocol during bolus administration of intravenous contrast. Multiplanar CT image reconstructions and MIPs were obtained to evaluate the vascular anatomy. CONTRAST:  35mL OMNIPAQUE IOHEXOL 350 MG/ML SOLN COMPARISON:  12/15/2004; chest radiograph-earlier same day FINDINGS: Vascular Findings: There is adequate opacification of the pulmonary arterial system with the main pulmonary artery measuring 410 Hounsfield units. Mixed occlusive and nonocclusive filling defects are seen within segmental and subsegmental pulmonary arteries bilaterally. The main and majority of the right and left pulmonary arteries remain patent without more central filling defect. Normal caliber the main pulmonary artery. While the overall clot burden is deemed moderate volume, there is bowing of the intraventricular septum with abnormal right to left  ventricular ratio of 2.0 as could be seen in the setting right-sided heart strain. No evidence of evolving pulmonary infarction Cardiomegaly.  No pericardial effusion. Normal caliber the thoracic aorta. Note is made of an aberrant right subclavian artery which courses posterior to the trachea and the esophagus. Review of the MIP images confirms the above findings. ---------------------------------------------------------------------------------- Nonvascular Findings: Mediastinum/Lymph Nodes: Scattered partially calcified mediastinal, prevascular and bilateral hilar lymph nodes compatible with known history of sarcoidosis. No bulky mediastinal, hilar axillary lymphadenopathy. Lungs/Pleura: Ill-defined ground-glass within the left upper lobe (represent image 22, series 7), is favored to represent an area of transient atelectasis given patient respiratory artifact. No discrete focal airspace opacities. No pleural effusion or pneumothorax. The central pulmonary airways appear widely patent. No discrete pulmonary nodules given limitation of the examination. Upper abdomen: Limited early arterial phase evaluation of the upper abdomen demonstrates diffuse decreased attenuation of the hepatic parenchyma with nodularity of the hepatic contour suggestive of cirrhosis. The spleen is noted to be atretic. Musculoskeletal: No acute or aggressive osseous abnormalities. Regional soft tissues appear normal. Normal appearance of the thyroid gland. IMPRESSION: 1. Positive for acute bilateral moderate volume pulmonary embolism with CT evidence of right heart strain (RV/LV Ratio = 2.0) consistent with at least submassive (intermediate risk) PE. The presence of right heart strain has been associated with an increased risk of morbidity and mortality. Please activate Code PE by paging [phone]. 2. Decreased attenuation of the hepatic parenchyma with nodularity hepatic contour compatible with hepatic cirrhosis, potentially progressed  compared to the 11/2004 examination. 3. Partially calcified mediastinal and bilateral hilar lymph nodes compatible provided history of sarcoidosis. 4. Incidentally noted aberrant right subclavian artery. Critical Value/emergent results were called by telephone at the time of interpretation on 08/15/2018 at 4:54 pm to Dr. Ronnald Nian, who verbally acknowledged these results. Electronically Signed   By: Sandi Mariscal M.D.   On: 08/15/2018 16:56   Dg Chest Port 1 View  Result Date: 08/15/2018 CLINICAL DATA:  Syncope EXAM: PORTABLE  CHEST 1 VIEW COMPARISON:  02/13/2018 FINDINGS: Mildly low lung volume. Borderline cardiomegaly. No focal consolidation or effusion. No pneumothorax. IMPRESSION: No active disease.  Low lung volumes. Electronically Signed   By: Donavan Foil M.D.   On: 08/15/2018 15:13    My personal review of EKG: Sinus tach at 125 bpm with nonspecific ST changes  Assessment & Plan  Pulmonary embolism; probably due to inactive lead style at this time Patient works from home and has not been ambulating significantly Start on heparin drip Discussed with pulmonology Check lower extremity Dopplers  Diabetes mellitus type 2 on chronic insulin Hold p.o. metformin, Farxiga and Lantus for now, patient is not eating enough ISS  Nausea and vomiting PRN Zofran Clear liquid diet  Hypertension Continue with Norvasc, Lotensin and hydrochlorothiazide  Hyperlipidemia Continue with Zocor   DVT Prophylaxis Heparin treatment dose  AM Labs Ordered, also please review Full Orders  Code Status full  Disposition Plan: Home  Time spent in minutes : 48 minutes  Condition critical care   @SIGNATURE @

## 2018-08-15 NOTE — ED Notes (Signed)
Sister, Hilda Blades, requesting update when available. Number in chart.

## 2018-08-15 NOTE — Progress Notes (Signed)
ANTICOAGULATION CONSULT NOTE - Initial Consult  Pharmacy Consult for heparin Indication: pulmonary embolus  Allergies  Allergen Reactions  . Naprosyn [Naproxen] Nausea Only    Patient Measurements: Height: 5\' 3"  (160 cm) Weight: 260 lb (117.9 kg) IBW/kg (Calculated) : 52.4 Heparin Dosing Weight: 81.2 kg   Vital Signs: Temp: 99.3 F (37.4 C) (06/20 1528) Temp Source: Rectal (06/20 1528) BP: 149/84 (06/20 1516) Pulse Rate: 136 (06/20 1516)  Labs: Recent Labs    08/15/18 1400 08/15/18 1456  HGB 12.5 14.6  HCT 42.1 43.0  PLT 167  --   LABPROT 13.7  --   INR 1.1  --   CREATININE 0.77  --   TROPONINI 0.06*  --     Estimated Creatinine Clearance: 98.6 mL/min (by C-G formula based on SCr of 0.77 mg/dL).   Medical History: Past Medical History:  Diagnosis Date  . Acid reflux   . Anemia   . Arthritis   . Cervical dysplasia   . Diabetes mellitus   . Heart murmur   . Hyperlipidemia   . Hypertension   . LBP (low back pain)   . Obesity   . OSA (obstructive sleep apnea) 03/22/2018   New diagnosis. Mild.   . Sarcoidosis   . Vitamin D deficiency     Assessment: 53yof presenting with tachycardia, nausea, and general weakness. D-dimer found to be 12.46. Troponin 0.06.  Hgb 13.7, plt 167. No s/sx of bleeding. No AC PTA. CTA pending.   Goal of Therapy:  Heparin level 0.3-0.7 units/ml Monitor platelets by anticoagulation protocol: Yes   Plan:  Give 4100 units bolus x 1 Start heparin infusion at 1300 units/hr Check anti-Xa level in 6 hours and daily while on heparin Continue to monitor H&H and platelets  Antonietta Jewel, PharmD, BCCCP Clinical Pharmacist  Pager: [phone] Phone: [phone] 08/15/2018,4:12 PM

## 2018-08-15 NOTE — ED Provider Notes (Signed)
[facility] DEPARTMENT Provider Note   CSN: [id number] Arrival date & time: 08/15/18  1310    History   Chief Complaint No chief complaint on file.   HPI Victoria Padilla is a 53 y.o. female.     HPI Hx obtained from the patient's daughter as well as the patient herself.  Patient daughter reports that the patient has been well she has not noted her to be ill.  Do live together.  She reports her mom works from home.  She is wondering if maybe she is not taking her medications since she has been home.  She reports that this morning she had been sitting in a chair for a number of hours holding her daughter's infant grandchild.  Her daughter reports that she went and took the baby and then her mother got up to go to the bathroom and passed out in the bathroom.  He did not with witness her passing out.  She went in to find her and she awakened and seem to be oriented to her surroundings.  She reports they tried to get up but she got real groggy and seemed to get a little bit weak again several times before they called EMS.  The patient reports that she is maybe not felt really good for a while.  She has a hard time defining any specific symptoms.  Reports she works from home.  She has not been having any syncopal episodes.  She denies any chest pain palpitation shortness of breath or headache.  She reports now she just feels incredibly washed out and weak  from head to toe.  He denies that she has any headache or chest pain.  Denies any neck pain any focal weakness numbness or tingling. Past Medical History:  Diagnosis Date  . Acid reflux   . Anemia   . Arthritis   . Cervical dysplasia   . Diabetes mellitus   . Heart murmur   . Hyperlipidemia   . Hypertension   . LBP (low back pain)   . Obesity   . OSA (obstructive sleep apnea) 03/22/2018   New diagnosis. Mild.   . Sarcoidosis   . Vitamin D deficiency     Patient Active Problem List   Diagnosis Date  Noted  . Weight gain 08/03/2018  . OSA (obstructive sleep apnea) 03/22/2018  . Orthopnea 02/13/2018  . Family history of heart disease in female family member before age 6 02/13/2018  . GERD (gastroesophageal reflux disease) 12/21/2015  . Personal history of noncompliance with medical treatment, presenting hazards to health 12/21/2015  . Morbid obesity ([place]) 03/03/2015  . History of anemia 03/03/2015  . Hyperlipidemia associated with type 2 diabetes mellitus ([place]) 03/03/2015  . Uncontrolled hypertension 03/03/2015  . 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 Uncontrolled type 2 diabetes mellitus with complication ([place])   . Arthritis   . Cervical dysplasia   . Sarcoidosis     Past Surgical History:  Procedure Laterality Date  . BUNIONECTOMY    . CHOLECYSTECTOMY    . COLPOSCOPY    . GYNECOLOGIC CRYOSURGERY    . KNEE ARTHROSCOPY    . TUBAL LIGATION       OB History    Gravida  3   Para  3   Term  3   Preterm      AB      Living  3     SAB  TAB      Ectopic      Multiple      Live Births               Home Medications    Prior to Admission medications   Medication Sig Start Date End Date Taking? Authorizing Provider  albuterol (PROVENTIL HFA;VENTOLIN HFA) 108 (90 Base) MCG/ACT inhaler Inhale 2 puffs into the lungs every 6 (six) hours as needed for wheezing or shortness of breath. 02/08/16   Henson, [name] L, NP-C  amLODipine (NORVASC) 5 MG tablet TAKE 1 TABLET BY MOUTH EVERY DAY 07/22/18   Henson, [name] L, NP-C  aspirin 81 MG tablet Take 1 tablet (81 mg total) by mouth daily. Reported on 08/08/2015 08/09/15   Tysinger, Camelia Eng, PA-C  B-D ULTRAFINE III SHORT PEN 31G X 8 MM MISC  03/27/18   [provider]  benazepril (LOTENSIN) 40 MG tablet TAKE 1 TABLET BY MOUTH EVERY DAY 07/23/18   Henson, [name] L, NP-C  cetirizine (ZYRTEC) 10 MG tablet Take 1 tablet (10 mg total) by mouth daily. 06/16/17   Tasia Catchings, Amy V, PA-C   cholecalciferol (VITAMIN D) 1000 units tablet Take 2,000 Units by mouth daily.    [provider]  dapagliflozin propanediol (FARXIGA) 5 MG TABS tablet Take 5 mg by mouth daily. 08/03/18   Renato Shin, MD  fluticasone [facility]) 50 MCG/ACT nasal spray Place 2 sprays into both nostrils as needed for allergies or rhinitis.    [provider]  glucose blood (ONETOUCH VERIO) test strip 1 each by Other route 2 (two) times daily. And lancets 2/day 01/08/18   Renato Shin, MD  hydrochlorothiazide (HYDRODIURIL) 25 MG tablet TAKE 1 TABLET BY MOUTH EVERY DAY 02/11/18   Henson, [name] L, NP-C  Insulin Glargine (BASAGLAR KWIKPEN) 100 UNIT/ML SOPN Inject 0.35 mLs (35 Units total) into the skin every morning. And pen needles 1/day 03/11/18   Renato Shin, MD  ipratropium (ATROVENT) 0.06 % nasal spray Place 2 sprays into both nostrils 4 (four) times daily. 06/16/17   Tasia Catchings, Amy V, PA-C  metFORMIN (GLUCOPHAGE-XR) 500 MG 24 hr tablet TAKE 4 TABLETS (2,000 MG TOTAL) BY MOUTH DAILY WITH BREAKFAST. 07/13/18   Renato Shin, MD  Multiple Vitamins-Minerals (MULTIVITAMIN PO) Take 2 capsules by mouth 2 (two) times daily. Reported on 03/10/2015 06/11/13   [provider]  Olopatadine HCl 0.2 % SOLN Apply 1 drop to eye daily. 06/16/17   Tasia Catchings, Amy V, PA-C  Omega-3 Fatty Acids (FISH OIL PO) Take 1 capsule by mouth daily. Reported on 08/08/2015    [provider]  Jonetta Speak LANCETS 81K MISC Test twice a day. Pt uses a one touch verio flex meter 12/05/16   Henson, [name] L, NP-C  simvastatin (ZOCOR) 20 MG tablet TAKE 1 TABLET BY MOUTH EVERYDAY AT BEDTIME 11/27/17   [name], NP-C    Family History Family History  Problem Relation Age of Onset  . Heart failure Mother 69       chf  . Heart failure Father   . Cancer Brother 47       prostate  . Hyperlipidemia Brother   . Hypertension Brother   . Diabetes Brother   . Hypertension Maternal Uncle   . Hyperlipidemia Brother   . Diabetes  Brother   . Colon cancer Maternal Grandfather   . Colon polyps Brother   . Heart disease Brother   . Stomach cancer Neg Hx   . Esophageal cancer Neg Hx   .  Rectal cancer Neg Hx   . Liver cancer Neg Hx     Social History Social History   Tobacco Use  . Smoking status: Never Smoker  . Smokeless tobacco: Never Used  Substance Use Topics  . Alcohol use: No  . Drug use: No     Allergies   Naprosyn [naproxen]   Review of Systems Review of Systems 10 Systems reviewed and are negative for acute change except as noted in the HPI.   Physical Exam Updated Vital Signs BP (!) 149/84   Pulse (!) 136   Temp 99.3 F (37.4 C) (Rectal)   Resp (!) 23   Ht 5\' 3"  (1.6 m)   Wt 117.9 kg   SpO2 97%   BMI 46.06 kg/m   Physical Exam Constitutional:      Comments: Alert but fatigued in appearance.  No respiratory distress at rest.  Oxygen saturation on the monitor shows low 90s on 2 L nasal cannula.  HENT:     Head: Normocephalic.  Eyes:     Extraocular Movements: Extraocular movements intact.     Pupils: Pupils are equal, round, and reactive to light.  Neck:     Musculoskeletal: Neck supple.  Cardiovascular:     Comments: Tachycardia.  No gross rub murmur gallop Pulmonary:     Effort: Pulmonary effort is normal.     Breath sounds: Normal breath sounds.  Abdominal:     General: There is no distension.     Palpations: Abdomen is soft.     Tenderness: There is no abdominal tenderness. There is no guarding.  Musculoskeletal: Normal range of motion.        General: No swelling or tenderness.     Comments: No significant peripheral edema.  Calves are symmetric.  Nontender.  Neurological:     General: No focal deficit present.     Mental Status: She is oriented to person, place, and time.     Cranial Nerves: No cranial nerve deficit.     Coordination: Coordination normal.     Comments: Patient seems generally weak.  She does not have a focal deficit.  She can perform grip  strength on both hands and move both lower extremities at will.  However is slow to do so and reports she feels extremely weak.  Psychiatric:     Comments: Mood and affect are somewhat flat.      ED Treatments / Results  Labs (all labs ordered are listed, but only abnormal results are displayed) Labs Reviewed  COMPREHENSIVE METABOLIC PANEL - Abnormal; Notable for the following components:      Result Value   CO2 20 (*)    Glucose, Bld 370 (*)    AST 164 (*)    ALT 122 (*)    Alkaline Phosphatase 130 (*)    All other components within normal limits  TROPONIN I - Abnormal; Notable for the following components:   Troponin I 0.06 (*)    All other components within normal limits  LACTIC ACID, PLASMA - Abnormal; Notable for the following components:   Lactic Acid, Venous 4.3 (*)    All other components within normal limits  CBC WITH DIFFERENTIAL/PLATELET - Abnormal; Notable for the following components:   WBC 12.2 (*)    RBC 5.57 (*)    MCV 75.6 (*)    MCH 22.4 (*)    MCHC 29.7 (*)    Neutro Abs 7.8 (*)    All other components within normal limits  D-DIMER,  QUANTITATIVE (NOT AT [facility]) - Abnormal; Notable for the following components:   D-Dimer, Quant 12.46 (*)    All other components within normal limits  CBG MONITORING, ED - Abnormal; Notable for the following components:   Glucose-Capillary 349 (*)    All other components within normal limits  POCT I-STAT EG7 - Abnormal; Notable for the following components:   pO2, Ven 206.0 (*)    All other components within normal limits  CULTURE, BLOOD (ROUTINE X 2)  CULTURE, BLOOD (ROUTINE X 2)  SARS CORONAVIRUS 2 (HOSPITAL ORDER, [facility])  PROTIME-INR  MAGNESIUM  PHOSPHORUS  TSH  LACTIC ACID, PLASMA  URINALYSIS, ROUTINE W REFLEX MICROSCOPIC  RAPID URINE DRUG SCREEN, HOSP PERFORMED  BRAIN NATRIURETIC PEPTIDE  I-STAT VENOUS BLOOD GAS, ED  TYPE AND SCREEN  ABO/RH    EKG EKG Interpretation   Date/Time:  Saturday August 15 2018 13:19:43 EDT Ventricular Rate:  125 PR Interval:    QRS Duration: 90 QT Interval:  297 QTC Calculation: 429 R Axis:   47 Text Interpretation:  Sinus tachycardia Minimal ST depression, lateral leads Confirmed by Lennice Sites [phone]) on 08/15/2018 3:09:25 PM   Radiology Ct Head Wo Contrast  Result Date: 08/15/2018 CLINICAL DATA:  Witnessed syncopal episode today. EXAM: CT HEAD WITHOUT CONTRAST TECHNIQUE: Contiguous axial images were obtained from the base of the skull through the vertex without intravenous contrast. COMPARISON:  None. FINDINGS: Brain: Ventricles, cisterns and other CSF spaces are normal. There is no mass, mass effect, shift of midline structures or acute hemorrhage. No evidence of acute infarction. Vascular: No hyperdense vessel or unexpected calcification. Skull: Normal. Negative for fracture or focal lesion. Sinuses/Orbits: No acute finding. Other: None. IMPRESSION: No acute findings. Electronically Signed   By: Marin Olp M.D.   On: 08/15/2018 15:19   Dg Chest Port 1 View  Result Date: 08/15/2018 CLINICAL DATA:  Syncope EXAM: PORTABLE CHEST 1 VIEW COMPARISON:  02/13/2018 FINDINGS: Mildly low lung volume. Borderline cardiomegaly. No focal consolidation or effusion. No pneumothorax. IMPRESSION: No active disease.  Low lung volumes. Electronically Signed   By: Donavan Foil M.D.   On: 08/15/2018 15:13    Procedures Procedures (including critical care time) CRITICAL CARE Performed by: Charlesetta Shanks   Total critical care time: 30  minutes  Critical care time was exclusive of separately billable procedures and treating other patients.  Critical care was necessary to treat or prevent imminent or life-threatening deterioration.  Critical care was time spent personally by me on the following activities: development of treatment plan with patient and/or surrogate as well as nursing, discussions with consultants, evaluation of patient's  response to treatment, examination of patient, obtaining history from patient or surrogate, ordering and performing treatments and interventions, ordering and review of laboratory studies, ordering and review of radiographic studies, pulse oximetry and re-evaluation of patient's condition. Medications Ordered in ED Medications  sodium chloride 0.9 % bolus 1,000 mL (has no administration in time range)  sodium chloride 0.9 % bolus 250 mL (0 mLs Intravenous Stopped 08/15/18 1558)  ondansetron (ZOFRAN) injection 4 mg (4 mg Intravenous Given 08/15/18 1430)     Initial Impression / Assessment and Plan / ED Course  I have reviewed the triage vital signs and the nursing notes.  Pertinent labs & imaging results that were available during my care of the patient were reviewed by me and considered in my medical decision making (see chart for details).       Patient presents after  syncope at home.  This appears to been unprovoked.  She clinically has been without signs of infectious illness.  Patient arrived and was tachycardic with new hypoxia.  Patient's d-dimer has returned significantly elevated.  At this time, based on hypoxia, tachycardia, significant elevated d-dimer and syncope with a negative CT head and no headache will start empiric heparin head of CT scanning.  Dr. Ronnald Nian to follow-up on CT results and final disposition.  Final Clinical Impressions(s) / ED Diagnoses   Final diagnoses:  None    ED Discharge Orders    None       Charlesetta Shanks, MD 08/15/18 1601

## 2018-08-15 NOTE — ED Provider Notes (Signed)
I assumed care of this patient from Dr. Colvin Caroli at 3 pm.  Please see their note for further details of Hx, PE.  Briefly patient is a 53 y.o. female who presented with presyncope.  Patient hypoxic, tachycardic.  Work-up thus far with positive troponin and d-dimer.  EKG unremarkable.  No significant cardiac history.  Patient empirically started on heparin for concern for PE given hypoxia and tachycardia.  Patient awaiting CT PE scan.  CT scan as discussed on the phone with radiology shows moderate burden of PE.  There is some right heart strain.  Patient does have positive troponin.  However blood pressure is stable at 140/80.  Patient stable on 2 L of oxygen.  IV heparin is running.  Will touch base with critical care about whether or not patient would be a candidate for lytics.  Talked with Dr. Lamonte Sakai with critical care and he states given her low oxygen need and stable blood pressure.  Is not an emergent need for lytics at this time.  He recommends get an echocardiogram which he will review and give further recommendations however at this time can continue heparin therapy given that patient is stable on 2 L of oxygen with normal blood pressure.  Patient continues to be tachycardic in the 120s.  Patient admitted to hospital service for further care.  This chart was dictated using voice recognition software.  Despite best efforts to proofread,  errors can occur which can change the documentation meaning.       Lennice Sites, DO 08/15/18 1706

## 2018-08-15 NOTE — ED Notes (Signed)
Pts family updated about pt status

## 2018-08-15 NOTE — ED Triage Notes (Signed)
[facility]- pt arrives from home. Pt had a witnessed syncopal episode by niece. Pt also has type 2 diabetes. CBG 450 with EMS. Pt states she is compliant with meds. Pt states she feels weak and lethargic. Pt had 1 vomit episode. Pt received 4 mg zofran   BP 170/100 A&O x4.  97% on 6L. No O2 use at home

## 2018-08-16 ENCOUNTER — Inpatient Hospital Stay (HOSPITAL_COMMUNITY): Payer: 59

## 2018-08-16 DIAGNOSIS — I361 Nonrheumatic tricuspid (valve) insufficiency: Secondary | ICD-10-CM

## 2018-08-16 DIAGNOSIS — I2699 Other pulmonary embolism without acute cor pulmonale: Secondary | ICD-10-CM

## 2018-08-16 DIAGNOSIS — I2609 Other pulmonary embolism with acute cor pulmonale: Secondary | ICD-10-CM

## 2018-08-16 LAB — GLUCOSE, CAPILLARY
Glucose-Capillary: 299 mg/dL — ABNORMAL HIGH (ref 70–99)
Glucose-Capillary: 299 mg/dL — ABNORMAL HIGH (ref 70–99)
Glucose-Capillary: 320 mg/dL — ABNORMAL HIGH (ref 70–99)
Glucose-Capillary: 242 mg/dL — ABNORMAL HIGH (ref 70–99)
Glucose-Capillary: 177 mg/dL — ABNORMAL HIGH (ref 70–99)

## 2018-08-16 LAB — HEPARIN LEVEL (UNFRACTIONATED)
Heparin Unfractionated: 0.7 IU/mL (ref 0.30–0.70)
Heparin Unfractionated: 0.95 IU/mL — ABNORMAL HIGH (ref 0.30–0.70)
Heparin Unfractionated: 0.9 IU/mL — ABNORMAL HIGH (ref 0.30–0.70)

## 2018-08-16 LAB — CBC
HCT: 39.8 % (ref 36.0–46.0)
Hemoglobin: 12 g/dL (ref 12.0–15.0)
MCH: 22.3 pg — ABNORMAL LOW (ref 26.0–34.0)
MCHC: 30.2 g/dL (ref 30.0–36.0)
MCV: 74.1 fL — ABNORMAL LOW (ref 80.0–100.0)
Platelets: 164 10*3/uL (ref 150–400)
RBC: 5.37 MIL/uL — ABNORMAL HIGH (ref 3.87–5.11)
RDW: 15.4 % (ref 11.5–15.5)
WBC: 10 10*3/uL (ref 4.0–10.5)
nRBC: 0 % (ref 0.0–0.2)

## 2018-08-16 LAB — BASIC METABOLIC PANEL
Anion gap: 10 (ref 5–15)
BUN: 15 mg/dL (ref 6–20)
CO2: 24 mmol/L (ref 22–32)
Calcium: 8.9 mg/dL (ref 8.9–10.3)
Chloride: 102 mmol/L (ref 98–111)
Creatinine, Ser: 0.98 mg/dL (ref 0.44–1.00)
GFR calc Af Amer: >60 mL/min (ref >60)
GFR calc non Af Amer: >60 mL/min (ref >60)
Glucose, Bld: 316 mg/dL — ABNORMAL HIGH (ref 70–99)
Potassium: 4.2 mmol/L (ref 3.5–5.1)
Sodium: 136 mmol/L (ref 135–145)

## 2018-08-16 LAB — PROTIME-INR
INR: 1.2 (ref 0.8–1.2)
Prothrombin Time: 14.7 seconds (ref 11.4–15.2)

## 2018-08-16 LAB — HIV ANTIBODY (ROUTINE TESTING W REFLEX): HIV Screen 4th Generation wRfx: NONREACTIVE

## 2018-08-16 LAB — ECHOCARDIOGRAM COMPLETE
Height: 63 in
Weight: 4160 oz

## 2018-08-16 LAB — MRSA PCR SCREENING: MRSA by PCR: NEGATIVE

## 2018-08-16 MED ORDER — BENAZEPRIL HCL 20 MG PO TABS
20.0000 mg | ORAL_TABLET | Freq: Every day | ORAL | Status: DC
  Filled 2018-08-16: qty 1

## 2018-08-16 MED ORDER — PROCHLORPERAZINE EDISYLATE 10 MG/2ML IJ SOLN
10.0000 mg | INTRAMUSCULAR | Status: DC | PRN
  Administered 2018-08-16 – 2018-08-18 (×2): 10 mg via INTRAVENOUS
  Filled 2018-08-16 (×4): qty 2

## 2018-08-16 MED ORDER — ONDANSETRON HCL 4 MG/2ML IJ SOLN
4.0000 mg | Freq: Three times a day (TID) | INTRAMUSCULAR | Status: AC
  Filled 2018-08-16: qty 2

## 2018-08-16 MED ORDER — DEXTROSE-NACL 5-0.45 % IV SOLN: INTRAVENOUS | Status: DC

## 2018-08-16 MED ORDER — METOPROLOL TARTRATE 12.5 MG HALF TABLET
12.5000 mg | ORAL_TABLET | Freq: Two times a day (BID) | ORAL | Status: DC
  Administered 2018-08-16 – 2018-08-17 (×3): 12.5 mg via ORAL
  Filled 2018-08-16 (×3): qty 1

## 2018-08-16 MED ORDER — ONDANSETRON HCL 4 MG/2ML IJ SOLN: 4.0000 mg | Freq: Three times a day (TID) | INTRAMUSCULAR | Status: DC

## 2018-08-16 MED ORDER — ONDANSETRON HCL 4 MG/2ML IJ SOLN
4.0000 mg | Freq: Three times a day (TID) | INTRAMUSCULAR | Status: DC | PRN
  Administered 2018-08-16: 03:00:00 4 mg via INTRAVENOUS
  Filled 2018-08-16: qty 2

## 2018-08-16 MED ORDER — SODIUM CHLORIDE 0.45 % IV SOLN
INTRAVENOUS | Status: DC
  Administered 2018-08-16 – 2018-08-17 (×2): via INTRAVENOUS

## 2018-08-16 NOTE — Progress Notes (Addendum)
[facility]                                                                              Patient Demographics  Victoria Padilla, is a 53 y.o. female, DOB - 1965-02-28, YBW:[id number]  Admit date - 08/15/2018   Admitting Physician Merton Border, MD  Outpatient Primary MD for the patient is Victoria Rm, NP-C  Outpatient specialists:   LOS - 1  days   Medical records reviewed and are as summarized below:    No chief complaint on file.      Brief summary   Patient is a 53 year old with history of diabetes type 2, hypertension, hyperlipidemia, OSA presented to ED with shortness of breath.  In ED, patient was found to be tachycardiac, somewhat confused.  She reported mild chest pressure with nausea and vomiting. CT angiogram of the chest was done which showed bilateral PEs with RV strain.  She was not started on lytic therapy due to the fact that she was hemodynamically stable, on O2 2 L.  Patient was started on heparin drip and admitted for further work-up. COVID-19 test negative  Assessment & Plan   Acute respiratory failure with hypoxia secondary to bilateral acute pulmonary embolism with RV strain -Per patient, she has been working from home and with sedentary lifestyle.  Still hypoxic and symptomatic with shortness of breath, chest tightness, nausea, vomiting.  Lower extremities mildly swollen. -Continue IV heparin drip -Doppler ultrasound of the lower extremities shows extensive DVT in bilateral lower extremities.  -2D echo showed EF of 60 to 65% with mildly reduced right ventricular systolic function -Discussed with vascular surgery, Dr. Carlis Abbott, likely not a candidate for lower extremity thrombolysis as common femoral veins are open, however iliac veins were not checked in the imaging.  Recommended IR opinion. -Interventional radiology consult placed for opinion about catheter directed thrombolysis for PE and lower extremity DVT -Given 53 years old with no prior  history of malignancy, extensive DVT and PE, ordered hypercoagulable work-up, CEA, CA-19-9 -Patient has been up-to-date with her malignancy screenings, she had regular mammograms, last mammogram and biopsy in 04/2017 had shown fibrocystic disease with adenosis and calcifications no malignancy.  Upper endoscopy and colonoscopy in 03/2016 was negative for any malignancy.  EGD, biopsy positive for H. pylori, patient was treated in 03/2016.  Patient had liver biopsy in 07/2001 which had shown rare noncaseating granuloma in the portal triad, consistent with sarcoidosis, mild steatosis. Addendum 1:30 PM Spoke with interventional radiology, Dr. Anselm Pancoast and pulmonology, Dr. Lamonte Sakai.  Reviewed patient's history, CTA scan, venous Dopplers, 2D echo, prior history of OSA, obesity which may have caused baseline right heart dysfunction.  At this point, no catheter directed or systemic thrombolysis is needed unless patient decompensates.   Active Problems: Nausea and vomiting -Likely due to #1, no abdominal pain, continue symptomatic treatment, clear liquid diet  Hypertension -BP currently soft, hold Norvasc, Lotensin, HCTZ -Placed on low-dose beta-blocker secondary to tachycardia  Diabetes mellitus type 2 -Continue sliding scale insulin, follow hemoglobin A1c  Code Status: Full CODE STATUS DVT Prophylaxis: Heparin drip Family Communication: Discussed in detail with the patient, all imaging results,  lab results explained to the patient   Disposition Plan: Continue progressive care, patient still hypoxic and symptomatic.  Awaiting IR recommendations if catheter directed thrombolysis is possible.  Time Spent in minutes 45 minutes  Procedures:  2D echo Doppler ultrasound of the lower extremities Summary: Right: Findings consistent with acute deep vein thrombosis involving the right femoral vein, right proximal profunda vein, right popliteal vein, right posterior tibial veins, and right peroneal veins. Left:  Findings consistent with acute deep vein thrombosis involving the distal left femoral vein, left popliteal vein, left posterior tibial veins, and left peroneal veins.  Consultants:   Discussed with Dr. Carlis Abbott via phone  Antimicrobials:   Anti-infectives (From admission, onward)   None          Medications  Scheduled Meds:  aspirin  81 mg Oral Daily   benazepril  20 mg Oral Daily   insulin aspart  0-5 Units Subcutaneous QHS   insulin aspart  0-9 Units Subcutaneous TID WC   ipratropium-albuterol  3 mL Nebulization Q6H   metoprolol tartrate  12.5 mg Oral BID   simvastatin  20 mg Oral QHS   sodium chloride flush  3 mL Intravenous Q12H   Continuous Infusions:  sodium chloride     heparin 1,300 Units/hr (08/16/18 1030)   PRN Meds:.sodium chloride, acetaminophen **OR** acetaminophen, prochlorperazine, sodium chloride flush, zolpidem      Subjective:   Sarh Kirschenbaum was seen and examined today.  Feels miserable with nausea, shortness of breath and chest tightness.  Feels very tired and weak.  Patient denies dizziness,, abdominal pain, D/C, new weakness, numbess, tingling.  No fevers.  Objective:   Vitals:   08/16/18 0800 08/16/18 1031 08/16/18 1059 08/16/18 1149  BP: 113/78 113/78 113/77 110/68  Pulse: (!) 111 (!) 109  (!) 101  Resp: (!) 27 18  (!) 23  Temp:  98.6 F (37 C) 98.6 F (37 C)   TempSrc:  Oral Oral   SpO2: 95% 97%  94%  Weight:      Height:        Intake/Output Summary (Last 24 hours) at 08/16/2018 1201 Last data filed at 08/16/2018 1031 Gross per 24 hour  Intake 484.19 ml  Output --  Net 484.19 ml     Wt Readings from Last 3 Encounters:  08/15/18 117.9 kg  04/06/18 116.8 kg  03/11/18 117.1 kg     Exam  General: Alert and oriented x 3, NAD, ill-appearing  Eyes:   HEENT:  Atraumatic, normocephalic, normal oropharynx  Cardiovascular: S1 S2 auscultated, Regular rate and rhythm.  Respiratory: Clear to auscultation  bilaterally, no wheezing, rales or rhonchi  Gastrointestinal: Soft, nontender, nondistended, + bowel sounds  Ext: 1+ pedal edema bilaterally, Lt> rt  Neuro: No new FND's  Musculoskeletal: No digital cyanosis, clubbing  Skin: No rashes  Psych: Normal affect and demeanor, alert and oriented x3    Data Reviewed:  I have personally reviewed following labs and imaging studies  Micro Results Recent Results (from the past 240 hour(s))  SARS Coronavirus 2 (CEPHEID - Performed in [place] hospital lab), Hosp Order     Status: None   Collection Time: 08/15/18  2:03 PM   Specimen: Nasopharyngeal Swab  Result Value Ref Range Status   SARS Coronavirus 2 NEGATIVE NEGATIVE Final    Comment: (NOTE) If result is NEGATIVE SARS-CoV-2 target nucleic acids are NOT DETECTED. The SARS-CoV-2 RNA is generally detectable in upper and lower  respiratory specimens during the acute phase of infection.  The lowest  concentration of SARS-CoV-2 viral copies this assay can detect is 250  copies / mL. A negative result does not preclude SARS-CoV-2 infection  and should not be used as the sole basis for treatment or other  patient management decisions.  A negative result may occur with  improper specimen collection / handling, submission of specimen other  than nasopharyngeal swab, presence of viral mutation(s) within the  areas targeted by this assay, and inadequate number of viral copies  (<250 copies / mL). A negative result must be combined with clinical  observations, patient history, and epidemiological information. If result is POSITIVE SARS-CoV-2 target nucleic acids are DETECTED. The SARS-CoV-2 RNA is generally detectable in upper and lower  respiratory specimens dur ing the acute phase of infection.  Positive  results are indicative of active infection with SARS-CoV-2.  Clinical  correlation with patient history and other diagnostic information is  necessary to determine patient infection  status.  Positive results do  not rule out bacterial infection or co-infection with other viruses. If result is PRESUMPTIVE POSTIVE SARS-CoV-2 nucleic acids MAY BE PRESENT.   A presumptive positive result was obtained on the submitted specimen  and confirmed on repeat testing.  While 2019 novel coronavirus  (SARS-CoV-2) nucleic acids may be present in the submitted sample  additional confirmatory testing may be necessary for epidemiological  and / or clinical management purposes  to differentiate between  SARS-CoV-2 and other Sarbecovirus currently known to infect humans.  If clinically indicated additional testing with an alternate test  methodology [phone]) is advised. The SARS-CoV-2 RNA is generally  detectable in upper and lower respiratory sp ecimens during the acute  phase of infection. The expected result is Negative. Fact Sheet for Patients:  StrictlyIdeas.no Fact Sheet for Healthcare Providers: BankingDealers.co.za This test is not yet approved or cleared by the [place] FDA and has been authorized for detection and/or diagnosis of SARS-CoV-2 by FDA under an Emergency Use Authorization (EUA).  This EUA will remain in effect (meaning this test can be used) for the duration of the COVID-19 declaration under Section 564(b)(1) of the Act, 21 U.S.C. section 360bbb-3(b)(1), unless the authorization is terminated or revoked sooner. Performed at [facility], [place] [address] [place], [place] [address]   MRSA PCR Screening     Status: None   Collection Time: 08/15/18  8:43 PM   Specimen: Nasal Mucosa; Nasopharyngeal  Result Value Ref Range Status   MRSA by PCR NEGATIVE NEGATIVE Final    Comment:        The GeneXpert MRSA Assay (FDA approved for NASAL specimens only), is one component of a comprehensive MRSA colonization surveillance program. It is not intended to diagnose MRSA infection nor to guide or monitor  treatment for MRSA infections. Performed at [facility], [place] [address], [place], [place] [address]     Radiology Reports Ct Head Wo Contrast  Result Date: 08/15/2018 CLINICAL DATA:  Witnessed syncopal episode today. EXAM: CT HEAD WITHOUT CONTRAST TECHNIQUE: Contiguous axial images were obtained from the base of the skull through the vertex without intravenous contrast. COMPARISON:  None. FINDINGS: Brain: Ventricles, cisterns and other CSF spaces are normal. There is no mass, mass effect, shift of midline structures or acute hemorrhage. No evidence of acute infarction. Vascular: No hyperdense vessel or unexpected calcification. Skull: Normal. Negative for fracture or focal lesion. Sinuses/Orbits: No acute finding. Other: None. IMPRESSION: No acute findings. Electronically Signed   By: Marin Olp M.D.  On: 08/15/2018 15:19   Ct Angio Chest Pe W/cm &/or Wo Cm  Result Date: 08/15/2018 CLINICAL DATA:  Chest pain.  Evaluate for pulmonary embolism. EXAM: CT ANGIOGRAPHY CHEST WITH CONTRAST TECHNIQUE: Multidetector CT imaging of the chest was performed using the standard protocol during bolus administration of intravenous contrast. Multiplanar CT image reconstructions and MIPs were obtained to evaluate the vascular anatomy. CONTRAST:  43mL OMNIPAQUE IOHEXOL 350 MG/ML SOLN COMPARISON:  12/15/2004; chest radiograph-earlier same day FINDINGS: Vascular Findings: There is adequate opacification of the pulmonary arterial system with the main pulmonary artery measuring 410 Hounsfield units. Mixed occlusive and nonocclusive filling defects are seen within segmental and subsegmental pulmonary arteries bilaterally. The main and majority of the right and left pulmonary arteries remain patent without more central filling defect. Normal caliber the main pulmonary artery. While the overall clot burden is deemed moderate volume, there is bowing of the intraventricular septum with abnormal right to left ventricular  ratio of 2.0 as could be seen in the setting right-sided heart strain. No evidence of evolving pulmonary infarction Cardiomegaly.  No pericardial effusion. Normal caliber the thoracic aorta. Note is made of an aberrant right subclavian artery which courses posterior to the trachea and the esophagus. Review of the MIP images confirms the above findings. ---------------------------------------------------------------------------------- Nonvascular Findings: Mediastinum/Lymph Nodes: Scattered partially calcified mediastinal, prevascular and bilateral hilar lymph nodes compatible with known history of sarcoidosis. No bulky mediastinal, hilar axillary lymphadenopathy. Lungs/Pleura: Ill-defined ground-glass within the left upper lobe (represent image 22, series 7), is favored to represent an area of transient atelectasis given patient respiratory artifact. No discrete focal airspace opacities. No pleural effusion or pneumothorax. The central pulmonary airways appear widely patent. No discrete pulmonary nodules given limitation of the examination. Upper abdomen: Limited early arterial phase evaluation of the upper abdomen demonstrates diffuse decreased attenuation of the hepatic parenchyma with nodularity of the hepatic contour suggestive of cirrhosis. The spleen is noted to be atretic. Musculoskeletal: No acute or aggressive osseous abnormalities. Regional soft tissues appear normal. Normal appearance of the thyroid gland. IMPRESSION: 1. Positive for acute bilateral moderate volume pulmonary embolism with CT evidence of right heart strain (RV/LV Ratio = 2.0) consistent with at least submassive (intermediate risk) PE. The presence of right heart strain has been associated with an increased risk of morbidity and mortality. Please activate Code PE by paging [phone]. 2. Decreased attenuation of the hepatic parenchyma with nodularity hepatic contour compatible with hepatic cirrhosis, potentially progressed compared to the  11/2004 examination. 3. Partially calcified mediastinal and bilateral hilar lymph nodes compatible provided history of sarcoidosis. 4. Incidentally noted aberrant right subclavian artery. Critical Value/emergent results were called by telephone at the time of interpretation on 08/15/2018 at 4:54 pm to Dr. Ronnald Nian, who verbally acknowledged these results. Electronically Signed   By: Sandi Mariscal M.D.   On: 08/15/2018 16:56   Dg Chest Port 1 View  Result Date: 08/15/2018 CLINICAL DATA:  Syncope EXAM: PORTABLE CHEST 1 VIEW COMPARISON:  02/13/2018 FINDINGS: Mildly low lung volume. Borderline cardiomegaly. No focal consolidation or effusion. No pneumothorax. IMPRESSION: No active disease.  Low lung volumes. Electronically Signed   By: Donavan Foil M.D.   On: 08/15/2018 15:13   Vas [place] Lower Extremity Venous (dvt)  Result Date: 08/16/2018  Lower Venous Study Indications: Pulmonary embolism.  Risk Factors: Confirmed PE. Comparison Study: No prior study on file for comparison. Performing Technologist: Sharion Dove RVS  Examination Guidelines: A complete evaluation includes B-mode imaging, spectral Doppler, color Doppler, and power Doppler  as needed of all accessible portions of each vessel. Bilateral testing is considered an integral part of a complete examination. Limited examinations for reoccurring indications may be performed as noted.  +---------+---------------+---------+-----------+----------+-------+  RIGHT     Compressibility Phasicity Spontaneity Properties Summary  +---------+---------------+---------+-----------+----------+-------+  CFV       Full            Yes       Yes                             +---------+---------------+---------+-----------+----------+-------+  SFJ       Full                                                      +---------+---------------+---------+-----------+----------+-------+  FV Prox   Partial                                          Acute     +---------+---------------+---------+-----------+----------+-------+  FV Mid    Partial                                          Acute    +---------+---------------+---------+-----------+----------+-------+  FV Distal Partial                                          Acute    +---------+---------------+---------+-----------+----------+-------+  PFV       Partial                                          Acute    +---------+---------------+---------+-----------+----------+-------+  POP       None            No        No                     Acute    +---------+---------------+---------+-----------+----------+-------+  PTV       None                                             Acute    +---------+---------------+---------+-----------+----------+-------+  PERO      None                                             Acute    +---------+---------------+---------+-----------+----------+-------+   +---------+---------------+---------+-----------+----------+-------+  LEFT      Compressibility Phasicity Spontaneity Properties Summary  +---------+---------------+---------+-----------+----------+-------+  CFV       Full            Yes       Yes                             +---------+---------------+---------+-----------+----------+-------+  SFJ       Full                                                      +---------+---------------+---------+-----------+----------+-------+  FV Prox   Full                                                      +---------+---------------+---------+-----------+----------+-------+  FV Mid    Full                                                      +---------+---------------+---------+-----------+----------+-------+  FV Distal None                                             Acute    +---------+---------------+---------+-----------+----------+-------+  PFV       Full                                                      +---------+---------------+---------+-----------+----------+-------+  POP       None                                              Acute    +---------+---------------+---------+-----------+----------+-------+  PTV       None                                             Acute    +---------+---------------+---------+-----------+----------+-------+  PERO      None                                             Acute    +---------+---------------+---------+-----------+----------+-------+     Summary: Right: Findings consistent with acute deep vein thrombosis involving the right femoral vein, right proximal profunda vein, right popliteal vein, right posterior tibial veins, and right peroneal veins. Left: Findings consistent with acute deep vein thrombosis involving the distal left femoral vein, left popliteal vein, left posterior tibial veins, and left peroneal veins.  *See table(s) above for measurements and observations. Electronically signed by Monica Martinez MD on 08/16/2018 at 10:01:15 AM.    Final     Lab Data:  CBC: Recent Labs  Lab 08/15/18 1400 08/15/18 1456  WBC 12.2*  --   NEUTROABS 7.8*  --   HGB 12.5 14.6  HCT 42.1 43.0  MCV 75.6*  --   PLT 167  --  Basic Metabolic Panel: Recent Labs  Lab 08/15/18 1400 08/15/18 1456  NA 140 139  K 3.7 4.3  CL 105  --   CO2 20*  --   GLUCOSE 370*  --   BUN 12  --   CREATININE 0.77  --   CALCIUM 9.2  --   MG 1.7  --   PHOS 3.8  --    GFR: Estimated Creatinine Clearance: 98.6 mL/min (by C-G formula based on SCr of 0.77 mg/dL). Liver Function Tests: Recent Labs  Lab 08/15/18 1400  AST 164*  ALT 122*  ALKPHOS 130*  BILITOT 0.5  PROT 7.6  ALBUMIN 3.5   No results for input(s): LIPASE, AMYLASE in the last 168 hours. No results for input(s): AMMONIA in the last 168 hours. Coagulation Profile: Recent Labs  Lab 08/15/18 1400  INR 1.1   Cardiac Enzymes: Recent Labs  Lab 08/15/18 1400  TROPONINI 0.06*   BNP (last 3 results) No results for input(s): PROBNP in the last 8760 hours. HbA1C: No results for input(s):  HGBA1C in the last 72 hours. CBG: Recent Labs  Lab 08/15/18 1321 08/15/18 2132 08/16/18 0440 08/16/18 0623 08/16/18 1122  GLUCAP 349* 314* 299* 299* 320*   Lipid Profile: No results for input(s): CHOL, HDL, LDLCALC, TRIG, CHOLHDL, LDLDIRECT in the last 72 hours. Thyroid Function Tests: Recent Labs    08/15/18 1432  TSH 0.955   Anemia Panel: No results for input(s): VITAMINB12, FOLATE, FERRITIN, TIBC, IRON, RETICCTPCT in the last 72 hours. Urine analysis:    Component Value Date/Time   COLORURINE YELLOW 09/11/2013 [place] 09/11/2013 1323   LABSPEC 1.030 05/08/2017 0921   PHURINE 8.0 09/11/2013 1323   GLUCOSEU NEGATIVE 09/11/2013 1323   HGBUR NEGATIVE 09/11/2013 1323   BILIRUBINUR negative 05/08/2017 0921   BILIRUBINUR n 12/14/2015 1638   KETONESUR negative 05/08/2017 [place] 09/11/2013 1323   PROTEINUR trace (A) 05/08/2017 0921   PROTEINUR n 12/14/2015 1638   PROTEINUR NEGATIVE 09/11/2013 1323   UROBILINOGEN negative 12/14/2015 1638   UROBILINOGEN 1.0 09/11/2013 1323   NITRITE Negative 05/08/2017 0921   NITRITE n 12/14/2015 1638   NITRITE NEGATIVE 09/11/2013 1323   LEUKOCYTESUR Negative 05/08/2017 0921     Aloysuis Ribaudo M.D. [facility] 08/16/2018, 12:01 PM  Pager: [phone] Between 7am to 7pm - call Pager - [phone]  After 7pm go to [email or web address] - password [email or web address]  Call night coverage person covering after 7pm

## 2018-08-16 NOTE — Progress Notes (Addendum)
[place] for heparin Indication: pulmonary embolus  Allergies  Allergen Reactions  . Naprosyn [Naproxen] Nausea Only    Patient Measurements: Height: 5\' 3"  (160 cm) Weight: 260 lb (117.9 kg)(pts stated weight. too nausous to weigh at this time) IBW/kg (Calculated) : 52.4 Heparin Dosing Weight: 81.2 kg   Vital Signs: Temp: 98.6 F (37 C) (06/21 1059) Temp Source: Oral (06/21 1059) BP: 110/68 (06/21 1149) Pulse Rate: 101 (06/21 1149)  Labs: Recent Labs    08/15/18 1400 08/15/18 1456 08/15/18 2321 08/16/18 1133  HGB 12.5 14.6  --  12.0  HCT 42.1 43.0  --  39.8  PLT 167  --   --  164  LABPROT 13.7  --   --  14.7  INR 1.1  --   --  1.2  HEPARINUNFRC  --   --  0.70 0.95*  CREATININE 0.77  --   --  0.98  TROPONINI 0.06*  --   --   --     Estimated Creatinine Clearance: 80.5 mL/min (by C-G formula based on SCr of 0.98 mg/dL).  Assessment: 53 y.o. female on heparin for acute bilateral PE with right heart strain and bilateral DVTs. Pharmacy consulted to dose heparin.  Heparin level supratherapeutic at 0.95 on heparin 1300 units/hr. CBC stable. Confirmed phlebotomist drew from opposite arm. No signs/symptoms of bleeding or issues with infusion.  Goal of Therapy:  Heparin level 0.3-0.7 units/ml Monitor platelets by anticoagulation protocol: Yes   Plan:  Decrease heparin to 1150 units/hr Heparin level in 6 hours Daily HL and CBC while on heparin   Claiborne Billings, PharmD PGY2 Cardiology Pharmacy Resident Please check AMION for all Pharmacist numbers by unit 08/16/2018 12:59 PM

## 2018-08-16 NOTE — Plan of Care (Signed)

## 2018-08-16 NOTE — Progress Notes (Signed)
[place] for heparin Indication: pulmonary embolus  Allergies  Allergen Reactions  . Naprosyn [Naproxen] Nausea Only    Patient Measurements: Height: 5\' 3"  (160 cm) Weight: 260 lb (117.9 kg)(pts stated weight. too nausous to weigh at this time) IBW/kg (Calculated) : 52.4 Heparin Dosing Weight: 81.2 kg   Vital Signs: Temp: 97.8 F (36.6 C) (06/20 2300) Temp Source: Oral (06/20 2300) BP: 165/114 (06/20 2300) Pulse Rate: 115 (06/20 2300)  Labs: Recent Labs    08/15/18 1400 08/15/18 1456 08/15/18 2321  HGB 12.5 14.6  --   HCT 42.1 43.0  --   PLT 167  --   --   LABPROT 13.7  --   --   INR 1.1  --   --   HEPARINUNFRC  --   --  0.70  CREATININE 0.77  --   --   TROPONINI 0.06*  --   --     Estimated Creatinine Clearance: 98.6 mL/min (by C-G formula based on SCr of 0.77 mg/dL).  Assessment: 53 y.o. female with PE for heparin   Goal of Therapy:  Heparin level 0.3-0.7 units/ml Monitor platelets by anticoagulation protocol: Yes   Plan:  Continue Heparin at current rate for now Follow-up am labs.  Phillis Knack, PharmD, BCPS  08/16/2018,12:04 AM

## 2018-08-16 NOTE — Progress Notes (Signed)
VASCULAR LAB PRELIMINARY  PRELIMINARY  PRELIMINARY  PRELIMINARY  Bilateral lower extremity venous duplex completed.    Preliminary report:  See CV Proc for preliminary results.   Gave report to [place], RN  Mauro Kaufmann, [place], RVT 08/16/2018, 9:47 AM

## 2018-08-16 NOTE — Progress Notes (Signed)
Patient still c/o nausea, administered compazine IVP, she stated that feel dizzy. Patient had hx of vagal stimulate episode last night. Checked v/s110/68 - 86- spO2 96%. Continuous monitor pt's condition and she said dizziness was better and sitting on the edge of the bed. HS [employer]

## 2018-08-16 NOTE — Progress Notes (Signed)
Spoke to son, Phillips Odor over the phone. His number [phone]. He lives in [place] and driving to [place] tomorrow. I updated him about his mom. He is aware of visitation restrictions, he spoke with his mom via RN's phone.

## 2018-08-16 NOTE — Progress Notes (Signed)
  Echocardiogram 2D Echocardiogram has been performed.  Burnett Kanaris 08/16/2018, 8:52 AM

## 2018-08-16 NOTE — Progress Notes (Signed)
[place] for heparin Indication: pulmonary embolus  Allergies  Allergen Reactions  . Naprosyn [Naproxen] Nausea Only    Patient Measurements: Height: 5\' 3"  (160 cm) Weight: 260 lb (117.9 kg)(pts stated weight. too nausous to weigh at this time) IBW/kg (Calculated) : 52.4 Heparin Dosing Weight: 81.2 kg   Vital Signs: Temp: 98.6 F (37 C) (06/21 1930) Temp Source: Oral (06/21 1930) BP: 155/97 (06/21 1930) Pulse Rate: 98 (06/21 1930)  Labs: Recent Labs    08/15/18 1400 08/15/18 1456 08/15/18 2321 08/16/18 1133 08/16/18 1920  HGB 12.5 14.6  --  12.0  --   HCT 42.1 43.0  --  39.8  --   PLT 167  --   --  164  --   LABPROT 13.7  --   --  14.7  --   INR 1.1  --   --  1.2  --   HEPARINUNFRC  --   --  0.70 0.95* 0.90*  CREATININE 0.77  --   --  0.98  --   TROPONINI 0.06*  --   --   --   --     Estimated Creatinine Clearance: 80.5 mL/min (by C-G formula based on SCr of 0.98 mg/dL).  Assessment: 53 y.o. female on heparin for acute bilateral PE with right heart strain and bilateral DVTs. Pharmacy consulted to dose heparin. -Heparin level remains supratherapeutic at 0.9 on heparin 1150 units/hr.  Goal of Therapy:  Heparin level 0.3-0.7 units/ml Monitor platelets by anticoagulation protocol: Yes   Plan:  Decrease heparin to 900 units/hr Daily HL and CBC while on heparin  Hildred Laser, PharmD Clinical Pharmacist **Pharmacist phone directory can now be found on [email or web address] ([email or web address]).  Listed under [place].

## 2018-08-16 NOTE — Consult Note (Signed)
Chief Complaint: Patient was seen in consultation today for bilateral PE and DVT/thrombolysis.  Referring Physician(s): Rai, Ripudeep K  Supervising Physician: Markus Daft  Patient Status: [facility] - In-pt  History of Present Illness: Victoria Padilla is a 53 y.o. female with a past medical history of hypertension, hyperlipidemia, sarcoidosis, GERD, diabetes mellitus, vitamin D deficiency, obesity, OSA, and arthritis. She presented to [facility] ED via EMS 08/15/2018 after having a syncopal episode. In ED, CT chest revealed bilateral PEs. She was admitted for further management. Echocardiogram revealed right heart strain and bilateral lower extremity venous duplex revealed bilateral DVTs. Patient was started on a Heparin drip. Vascular surgery was consulted and states patient is not a candidate for lower extremity thrombolysis (as common femoral veins are open) and recommended IR consultation for possible PE/DVT thrombolysis.  IR requested by Dr. Tana Coast for possible image-guided PE/DVT thrombolysis. Patient awake and alert laying in bed resting but responds to voice. States that she is very tired. Denies fever, chills, chest pain, dyspnea, abdominal pain, or headache.  Patient is currently sating at 96% on 4L [place].   Past Medical History:  Diagnosis Date   Acid reflux    Anemia    Arthritis    Cervical dysplasia    Diabetes mellitus    Heart murmur    Hyperlipidemia    Hypertension    LBP (low back pain)    Obesity    OSA (obstructive sleep apnea) 03/22/2018   New diagnosis. Mild.    Sarcoidosis    Vitamin D deficiency     Past Surgical History:  Procedure Laterality Date   BUNIONECTOMY     CHOLECYSTECTOMY     COLPOSCOPY     GYNECOLOGIC CRYOSURGERY     KNEE ARTHROSCOPY     TUBAL LIGATION      Allergies: Naprosyn [naproxen]  Medications: Prior to Admission medications   Medication Sig Start Date End Date Taking? Authorizing Provider  albuterol (PROVENTIL  HFA;VENTOLIN HFA) 108 (90 Base) MCG/ACT inhaler Inhale 2 puffs into the lungs every 6 (six) hours as needed for wheezing or shortness of breath. Patient taking differently: Inhale 2 puffs into the lungs every 6 (six) hours as needed for wheezing or shortness of breath (seasonal allergies).  02/08/16  Yes Henson, [name] L, NP-C  amLODipine (NORVASC) 5 MG tablet TAKE 1 TABLET BY MOUTH EVERY DAY Patient taking differently: Take 5 mg by mouth at bedtime.  07/22/18  Yes Henson, [name] L, NP-C  aspirin 81 MG tablet Take 1 tablet (81 mg total) by mouth daily. Reported on 08/08/2015 Patient taking differently: Take 81 mg by mouth at bedtime. Reported on 08/08/2015 08/09/15  Yes Tysinger, Camelia Eng, PA-C  benazepril (LOTENSIN) 40 MG tablet TAKE 1 TABLET BY MOUTH EVERY DAY Patient taking differently: Take 40 mg by mouth at bedtime.  07/23/18  Yes Henson, [name] L, NP-C  cetirizine (ZYRTEC) 10 MG tablet Take 1 tablet (10 mg total) by mouth daily. Patient taking differently: Take 10 mg by mouth at bedtime as needed (seasonal allergies).  06/16/17  Yes Yu, Amy V, PA-C  cholecalciferol (VITAMIN D) 1000 units tablet Take 2,000 Units by mouth at bedtime.    Yes [provider]  fluticasone (FLONASE) 50 MCG/ACT nasal spray Place 2 sprays into both nostrils daily as needed (seasonal allergies).    Yes [provider]  hydrochlorothiazide (HYDRODIURIL) 25 MG tablet TAKE 1 TABLET BY MOUTH EVERY DAY Patient taking differently: Take 25 mg by mouth at bedtime.  02/11/18  Yes Henson, [name] L, NP-C  ibuprofen (ADVIL) 200 MG tablet Take 200 mg by mouth every 6 (six) hours as needed for headache (pain).   Yes [provider]  metFORMIN (GLUCOPHAGE-XR) 500 MG 24 hr tablet TAKE 4 TABLETS (2,000 MG TOTAL) BY MOUTH DAILY WITH BREAKFAST. Patient taking differently: Take 2,000 mg by mouth at bedtime.  07/13/18  Yes Renato Shin, MD  Multiple Vitamin (MULTIVITAMIN WITH MINERALS) TABS tablet Take 1 tablet by mouth  at bedtime.   Yes [provider]  Olopatadine HCl 0.2 % SOLN Apply 1 drop to eye daily. Patient taking differently: Place 1 drop into both eyes daily as needed (seasonal allergies).  06/16/17  Yes Yu, Amy V, PA-C  simvastatin (ZOCOR) 20 MG tablet TAKE 1 TABLET BY MOUTH EVERYDAY AT BEDTIME Patient taking differently: Take 20 mg by mouth at bedtime.  11/27/17  Yes Henson, [name] L, NP-C  B-D ULTRAFINE III SHORT PEN 31G X 8 MM MISC  03/27/18   [provider]  dapagliflozin propanediol (FARXIGA) 5 MG TABS tablet Take 5 mg by mouth daily. Patient taking differently: Take 5 mg by mouth at bedtime.  08/03/18   Renato Shin, MD  glucose blood [facility] VERIO) test strip 1 each by Other route 2 (two) times daily. And lancets 2/day 01/08/18   Renato Shin, MD  Insulin Glargine (BASAGLAR KWIKPEN) 100 UNIT/ML SOPN Inject 0.35 mLs (35 Units total) into the skin every morning. And pen needles 1/day Patient not taking: Reported on 08/15/2018 03/11/18   Renato Shin, MD  ipratropium (ATROVENT) 0.06 % nasal spray Place 2 sprays into both nostrils 4 (four) times daily. Patient not taking: Reported on 08/15/2018 06/16/17   Ok Edwards, PA-C  Omega-3 Fatty Acids (FISH OIL PO) Take 1 capsule by mouth at bedtime. Reported on 08/08/2015    [provider]  Jonetta Speak LANCETS 93O MISC Test twice a day. Pt uses a one touch verio flex meter 12/05/16   Henson, [name] L, NP-C     Family History  Problem Relation Age of Onset   Heart failure Mother 60       chf   Heart failure Father    Cancer Brother 4       prostate   Hyperlipidemia Brother    Hypertension Brother    Diabetes Brother    Hypertension Maternal Uncle    Hyperlipidemia Brother    Diabetes Brother    Colon cancer Maternal Grandfather    Colon polyps Brother    Heart disease Brother    Stomach cancer Neg Hx    Esophageal cancer Neg Hx    Rectal cancer Neg Hx    Liver cancer Neg Hx     Social History    Socioeconomic History   Marital status: Single    Spouse name: Not on file   Number of children: 3   Years of education: Not on file   Highest education level: Not on file  Occupational History    Employer: [employer]  Social Needs   Financial resource strain: Not on file   Food insecurity    Worry: Not on file    Inability: Not on file   Transportation needs    Medical: Not on file    Non-medical: Not on file  Tobacco Use   Smoking status: Never Smoker   Smokeless tobacco: Never Used  Substance and Sexual Activity   Alcohol use: No   Drug use: No   Sexual activity: Never  Birth control/protection: Surgical  Lifestyle   Physical activity    Days per week: Not on file    Minutes per session: Not on file   Stress: Not on file  Relationships   Social connections    Talks on phone: Not on file    Gets together: Not on file    Attends religious service: Not on file    Active member of club or organization: Not on file    Attends meetings of clubs or organizations: Not on file    Relationship status: Not on file  Other Topics Concern   Not on file  Social History Narrative   Not on file     Review of Systems: A 12 point ROS discussed and pertinent positives are indicated in the HPI above.  All other systems are negative.  Review of Systems  Constitutional: Negative for chills and fever.  Respiratory: Negative for shortness of breath and wheezing.   Cardiovascular: Negative for chest pain and palpitations.  Gastrointestinal: Negative for abdominal pain.  Neurological: Negative for headaches.  Psychiatric/Behavioral: Negative for behavioral problems and confusion.    Vital Signs: BP 110/68    Pulse (!) 101    Temp 98.6 F (37 C) (Oral)    Resp (!) 23    Ht 5\' 3"  (1.6 m)    Wt 260 lb (117.9 kg) Comment: pts stated weight. too nausous to weigh at this time   SpO2 94%    BMI 46.06 kg/m   Physical Exam Vitals signs and nursing note reviewed.   Constitutional:      General: She is not in acute distress.    Appearance: Normal appearance.  Cardiovascular:     Rate and Rhythm: Regular rhythm. Tachycardia present.     Heart sounds: Normal heart sounds. No murmur.  Pulmonary:     Effort: Pulmonary effort is normal. No respiratory distress.     Breath sounds: Normal breath sounds. No wheezing.  Musculoskeletal:     Comments: Left lower extremity appears slightly larger than right, although difficult to tell due to chronic lower extremity edema; bilateral lower extremities mildly firm with tenderness of left lower extremity (no tenderness of right lower extremity); unable to palpate bilateral distal pulses but probably again related to chronic lower extremity edema.  Skin:    General: Skin is warm and dry.  Neurological:     Mental Status: She is alert and oriented to person, place, and time.  Psychiatric:        Mood and Affect: Mood normal.        Behavior: Behavior normal.        Thought Content: Thought content normal.        Judgment: Judgment normal.      Imaging: Ct Head Wo Contrast  Result Date: 08/15/2018 CLINICAL DATA:  Witnessed syncopal episode today. EXAM: CT HEAD WITHOUT CONTRAST TECHNIQUE: Contiguous axial images were obtained from the base of the skull through the vertex without intravenous contrast. COMPARISON:  None. FINDINGS: Brain: Ventricles, cisterns and other CSF spaces are normal. There is no mass, mass effect, shift of midline structures or acute hemorrhage. No evidence of acute infarction. Vascular: No hyperdense vessel or unexpected calcification. Skull: Normal. Negative for fracture or focal lesion. Sinuses/Orbits: No acute finding. Other: None. IMPRESSION: No acute findings. Electronically Signed   By: Marin Olp M.D.   On: 08/15/2018 15:19   Ct Angio Chest Pe W/cm &/or Wo Cm  Result Date: 08/15/2018 CLINICAL DATA:  Chest pain.  Evaluate for pulmonary embolism. EXAM: CT ANGIOGRAPHY CHEST WITH  CONTRAST TECHNIQUE: Multidetector CT imaging of the chest was performed using the standard protocol during bolus administration of intravenous contrast. Multiplanar CT image reconstructions and MIPs were obtained to evaluate the vascular anatomy. CONTRAST:  48mL OMNIPAQUE IOHEXOL 350 MG/ML SOLN COMPARISON:  12/15/2004; chest radiograph-earlier same day FINDINGS: Vascular Findings: There is adequate opacification of the pulmonary arterial system with the main pulmonary artery measuring 410 Hounsfield units. Mixed occlusive and nonocclusive filling defects are seen within segmental and subsegmental pulmonary arteries bilaterally. The main and majority of the right and left pulmonary arteries remain patent without more central filling defect. Normal caliber the main pulmonary artery. While the overall clot burden is deemed moderate volume, there is bowing of the intraventricular septum with abnormal right to left ventricular ratio of 2.0 as could be seen in the setting right-sided heart strain. No evidence of evolving pulmonary infarction Cardiomegaly.  No pericardial effusion. Normal caliber the thoracic aorta. Note is made of an aberrant right subclavian artery which courses posterior to the trachea and the esophagus. Review of the MIP images confirms the above findings. ---------------------------------------------------------------------------------- Nonvascular Findings: Mediastinum/Lymph Nodes: Scattered partially calcified mediastinal, prevascular and bilateral hilar lymph nodes compatible with known history of sarcoidosis. No bulky mediastinal, hilar axillary lymphadenopathy. Lungs/Pleura: Ill-defined ground-glass within the left upper lobe (represent image 22, series 7), is favored to represent an area of transient atelectasis given patient respiratory artifact. No discrete focal airspace opacities. No pleural effusion or pneumothorax. The central pulmonary airways appear widely patent. No discrete pulmonary  nodules given limitation of the examination. Upper abdomen: Limited early arterial phase evaluation of the upper abdomen demonstrates diffuse decreased attenuation of the hepatic parenchyma with nodularity of the hepatic contour suggestive of cirrhosis. The spleen is noted to be atretic. Musculoskeletal: No acute or aggressive osseous abnormalities. Regional soft tissues appear normal. Normal appearance of the thyroid gland. IMPRESSION: 1. Positive for acute bilateral moderate volume pulmonary embolism with CT evidence of right heart strain (RV/LV Ratio = 2.0) consistent with at least submassive (intermediate risk) PE. The presence of right heart strain has been associated with an increased risk of morbidity and mortality. Please activate Code PE by paging [phone]. 2. Decreased attenuation of the hepatic parenchyma with nodularity hepatic contour compatible with hepatic cirrhosis, potentially progressed compared to the 11/2004 examination. 3. Partially calcified mediastinal and bilateral hilar lymph nodes compatible provided history of sarcoidosis. 4. Incidentally noted aberrant right subclavian artery. Critical Value/emergent results were called by telephone at the time of interpretation on 08/15/2018 at 4:54 pm to Dr. Ronnald Nian, who verbally acknowledged these results. Electronically Signed   By: Sandi Mariscal M.D.   On: 08/15/2018 16:56   Dg Chest Port 1 View  Result Date: 08/15/2018 CLINICAL DATA:  Syncope EXAM: PORTABLE CHEST 1 VIEW COMPARISON:  02/13/2018 FINDINGS: Mildly low lung volume. Borderline cardiomegaly. No focal consolidation or effusion. No pneumothorax. IMPRESSION: No active disease.  Low lung volumes. Electronically Signed   By: Donavan Foil M.D.   On: 08/15/2018 15:13   Vas [place] Lower Extremity Venous (dvt)  Result Date: 08/16/2018  Lower Venous Study Indications: Pulmonary embolism.  Risk Factors: Confirmed PE. Comparison Study: No prior study on file for comparison. Performing  Technologist: Sharion Dove RVS  Examination Guidelines: A complete evaluation includes B-mode imaging, spectral Doppler, color Doppler, and power Doppler as needed of all accessible portions of each vessel. Bilateral testing is considered an integral part of a complete examination. Limited examinations  for reoccurring indications may be performed as noted.  +---------+---------------+---------+-----------+----------+-------+  RIGHT     Compressibility Phasicity Spontaneity Properties Summary  +---------+---------------+---------+-----------+----------+-------+  CFV       Full            Yes       Yes                             +---------+---------------+---------+-----------+----------+-------+  SFJ       Full                                                      +---------+---------------+---------+-----------+----------+-------+  FV Prox   Partial                                          Acute    +---------+---------------+---------+-----------+----------+-------+  FV Mid    Partial                                          Acute    +---------+---------------+---------+-----------+----------+-------+  FV Distal Partial                                          Acute    +---------+---------------+---------+-----------+----------+-------+  PFV       Partial                                          Acute    +---------+---------------+---------+-----------+----------+-------+  POP       None            No        No                     Acute    +---------+---------------+---------+-----------+----------+-------+  PTV       None                                             Acute    +---------+---------------+---------+-----------+----------+-------+  PERO      None                                             Acute    +---------+---------------+---------+-----------+----------+-------+   +---------+---------------+---------+-----------+----------+-------+  LEFT       Compressibility Phasicity Spontaneity Properties Summary  +---------+---------------+---------+-----------+----------+-------+  CFV       Full            Yes       Yes                             +---------+---------------+---------+-----------+----------+-------+  SFJ  Full                                                      +---------+---------------+---------+-----------+----------+-------+  FV Prox   Full                                                      +---------+---------------+---------+-----------+----------+-------+  FV Mid    Full                                                      +---------+---------------+---------+-----------+----------+-------+  FV Distal None                                             Acute    +---------+---------------+---------+-----------+----------+-------+  PFV       Full                                                      +---------+---------------+---------+-----------+----------+-------+  POP       None                                             Acute    +---------+---------------+---------+-----------+----------+-------+  PTV       None                                             Acute    +---------+---------------+---------+-----------+----------+-------+  PERO      None                                             Acute    +---------+---------------+---------+-----------+----------+-------+     Summary: Right: Findings consistent with acute deep vein thrombosis involving the right femoral vein, right proximal profunda vein, right popliteal vein, right posterior tibial veins, and right peroneal veins. Left: Findings consistent with acute deep vein thrombosis involving the distal left femoral vein, left popliteal vein, left posterior tibial veins, and left peroneal veins.  *See table(s) above for measurements and observations. Electronically signed by Monica Martinez MD on 08/16/2018 at 10:01:15 AM.    Final     Labs:  CBC: Recent Labs    02/13/18 1425  08/15/18 1400 08/15/18 1456 08/16/18 1133  WBC 7.2 12.2*  --  10.0  HGB 11.7 12.5 14.6 12.0  HCT 37.5 42.1 43.0 39.8  PLT 269 167  --  164    COAGS: Recent  Labs    08/15/18 1400 08/16/18 1133  INR 1.1 1.2    BMP: Recent Labs    02/13/18 1425 08/15/18 1400 08/15/18 1456 08/16/18 1133  NA 139 140 139 136  K 4.7 3.7 4.3 4.2  CL 100 105  --  102  CO2 24 20*  --  24  GLUCOSE 104* 370*  --  316*  BUN 9 12  --  15  CALCIUM 9.4 9.2  --  8.9  CREATININE 0.71 0.77  --  0.98  GFRNONAA 97 >60  --  >60  GFRAA 112 >60  --  >60    LIVER FUNCTION TESTS: Recent Labs    02/13/18 1425 08/15/18 1400  BILITOT <0.2 0.5  AST 31 164*  ALT 37* 122*  ALKPHOS 94 130*  PROT 6.9 7.6  ALBUMIN 4.0 3.5     Assessment and Plan:  Bilateral PE and DVT. Patient stable, no respiratory distress, sating 96% on 4L [place]. Discussed thrombolysis procedures, including risks and benefits. At this time, do not recommend DVT thrombolysis, as patient is stable. Regarding PE thrombolysis, echocardiogram does demonstrate right heart strain, however patient with history of sarcoidosis- possible right heart strain at baseline from this. Recommend pulmonology consult and possibly repeat echocardiogram in the next few days (to see if heart strain improves on continuous heparin). Dr. Anselm Pancoast discussed above with Dr. Tana Coast.  Risks and benefits discussed with the patient including, but not limited to bleeding, possible life threatening bleeding and need for blood product transfusion, vascular injury, stroke, contrast induced renal failure, limb loss and infection. All of the patient's questions were answered, patient is agreeable to proceed. Consent signed and in chart.   Thank you for this interesting consult.  I greatly enjoyed meeting Victoria Padilla and look forward to participating in their care.  A copy of this report was sent to the requesting provider on this date.  Electronically Signed: Earley Abide,  PA-C 08/16/2018, 1:29 PM   I spent a total of 40 Minutes in face to face in clinical consultation, greater than 50% of which was counseling/coordinating care for bilateral PE and DVT/thrombolysis.

## 2018-08-16 NOTE — Care Management (Signed)
Consult received to check copays for Xarelto and Eliquis.  Per [facility] patient's copay for both is $0.

## 2018-08-17 DIAGNOSIS — G4733 Obstructive sleep apnea (adult) (pediatric): Secondary | ICD-10-CM

## 2018-08-17 LAB — CBC
HCT: 40 % (ref 36.0–46.0)
Hemoglobin: 12.2 g/dL (ref 12.0–15.0)
MCH: 22.6 pg — ABNORMAL LOW (ref 26.0–34.0)
MCHC: 30.5 g/dL (ref 30.0–36.0)
MCV: 74.2 fL — ABNORMAL LOW (ref 80.0–100.0)
Platelets: 124 10*3/uL — ABNORMAL LOW (ref 150–400)
RBC: 5.39 MIL/uL — ABNORMAL HIGH (ref 3.87–5.11)
RDW: 15.2 % (ref 11.5–15.5)
WBC: 9.3 10*3/uL (ref 4.0–10.5)
nRBC: 0 % (ref 0.0–0.2)

## 2018-08-17 LAB — GLUCOSE, CAPILLARY
Glucose-Capillary: 166 mg/dL — ABNORMAL HIGH (ref 70–99)
Glucose-Capillary: 234 mg/dL — ABNORMAL HIGH (ref 70–99)
Glucose-Capillary: 293 mg/dL — ABNORMAL HIGH (ref 70–99)
Glucose-Capillary: 215 mg/dL — ABNORMAL HIGH (ref 70–99)

## 2018-08-17 LAB — HEPARIN LEVEL (UNFRACTIONATED)
Heparin Unfractionated: 0.17 IU/mL — ABNORMAL LOW (ref 0.30–0.70)
Heparin Unfractionated: 0.31 IU/mL (ref 0.30–0.70)

## 2018-08-17 LAB — BASIC METABOLIC PANEL
Anion gap: 11 (ref 5–15)
BUN: 12 mg/dL (ref 6–20)
CO2: 26 mmol/L (ref 22–32)
Calcium: 8.9 mg/dL (ref 8.9–10.3)
Chloride: 100 mmol/L (ref 98–111)
Creatinine, Ser: 0.92 mg/dL (ref 0.44–1.00)
GFR calc Af Amer: >60 mL/min (ref >60)
GFR calc non Af Amer: >60 mL/min (ref >60)
Glucose, Bld: 253 mg/dL — ABNORMAL HIGH (ref 70–99)
Potassium: 3.6 mmol/L (ref 3.5–5.1)
Sodium: 137 mmol/L (ref 135–145)

## 2018-08-17 LAB — ANTITHROMBIN III: AntiThromb III Func: 97 % (ref 75–120)

## 2018-08-17 MED ORDER — BENAZEPRIL HCL 40 MG PO TABS
40.0000 mg | ORAL_TABLET | Freq: Every day | ORAL | Status: DC
  Administered 2018-08-17 – 2018-08-18 (×2): 40 mg via ORAL
  Filled 2018-08-17 (×2): qty 1

## 2018-08-17 MED ORDER — IPRATROPIUM-ALBUTEROL 0.5-2.5 (3) MG/3ML IN SOLN
3.0000 mL | Freq: Three times a day (TID) | RESPIRATORY_TRACT | Status: DC
  Administered 2018-08-17: 3 mL via RESPIRATORY_TRACT
  Filled 2018-08-17: qty 3

## 2018-08-17 MED ORDER — BENZONATATE 100 MG PO CAPS
100.0000 mg | ORAL_CAPSULE | Freq: Three times a day (TID) | ORAL | Status: DC
  Administered 2018-08-17 – 2018-08-18 (×3): 100 mg via ORAL
  Filled 2018-08-17 (×3): qty 1

## 2018-08-17 MED ORDER — SODIUM CHLORIDE 0.9% FLUSH: 10.0000 mL | INTRAVENOUS | Status: DC | PRN

## 2018-08-17 MED ORDER — IPRATROPIUM-ALBUTEROL 0.5-2.5 (3) MG/3ML IN SOLN: 3.0000 mL | Freq: Four times a day (QID) | RESPIRATORY_TRACT | Status: DC | PRN

## 2018-08-17 MED ORDER — METOPROLOL TARTRATE 25 MG PO TABS
25.0000 mg | ORAL_TABLET | Freq: Two times a day (BID) | ORAL | Status: DC
  Administered 2018-08-17 – 2018-08-18 (×2): 25 mg via ORAL
  Filled 2018-08-17 (×2): qty 1

## 2018-08-17 MED ORDER — INSULIN GLARGINE 100 UNIT/ML ~~LOC~~ SOLN
15.0000 [IU] | Freq: Every day | SUBCUTANEOUS | Status: DC
  Administered 2018-08-17 – 2018-08-18 (×2): 15 [IU] via SUBCUTANEOUS
  Filled 2018-08-17 (×2): qty 0.15

## 2018-08-17 NOTE — Progress Notes (Signed)
Pt ambulated in a bathroom for BM, tolerated well first time getting out of bed. Oxygen dropped to 86 so started again @2l /min, pt sitting in a recliner in a comfortable position now, IV heparin is contd @9 , will continue to monitor the patient  Palma Holter, RN

## 2018-08-17 NOTE — Progress Notes (Signed)
[place] for heparin Indication: pulmonary embolus  Allergies  Allergen Reactions  . Naprosyn [Naproxen] Nausea Only    Patient Measurements: Height: 5\' 3"  (160 cm) Weight: 260 lb (117.9 kg)(pts stated weight. too nausous to weigh at this time) IBW/kg (Calculated) : 52.4 Heparin Dosing Weight: 81.2 kg   Vital Signs: Temp: 97.9 F (36.6 C) (06/22 1046) Temp Source: Oral (06/22 1046) BP: 156/94 (06/22 1046) Pulse Rate: 84 (06/22 1046)  Labs: Recent Labs    08/15/18 1400 08/15/18 1456  08/16/18 1133 08/16/18 1920 08/17/18 0840 08/17/18 1015  HGB 12.5 14.6  --  12.0  --  12.2  --   HCT 42.1 43.0  --  39.8  --  40.0  --   PLT 167  --   --  164  --  124*  --   LABPROT 13.7  --   --  14.7  --   --   --   INR 1.1  --   --  1.2  --   --   --   HEPARINUNFRC  --   --    < > 0.95* 0.90*  --  0.17*  CREATININE 0.77  --   --  0.98  --   --   --   TROPONINI 0.06*  --   --   --   --   --   --    < > = values in this interval not displayed.    Estimated Creatinine Clearance: 80.5 mL/min (by C-G formula based on SCr of 0.98 mg/dL).  Assessment: 53 y.o. female on heparin for acute bilateral PE with right heart strain and bilateral DVTs. Pharmacy consulted to dose heparin. -Heparin level remains supratherapeutic at 0.9 on heparin 1150 units/hr.  Goal of Therapy:  Heparin level 0.3-0.7 units/ml Monitor platelets by anticoagulation protocol: Yes   Plan:  Increase IV heparin to 1050 units/hr. Recheck heparin level in 6 hrs Daily HL and CBC while on heparin F/u plans for oral anticoagulation eventually.  Marguerite Olea, [facility] Clinical Pharmacist Phone [phone]  08/17/2018 11:08 AM

## 2018-08-17 NOTE — Progress Notes (Signed)
Inpatient Diabetes Program Recommendations  AACE/ADA: New Consensus Statement on Inpatient Glycemic Control (2015)  Target Ranges:  Prepandial:   less than 140 mg/dL      Peak postprandial:   less than 180 mg/dL (1-2 hours)      Critically ill patients:  140 - 180 mg/dL   Lab Results  Component Value Date   GLUCAP 166 (H) 08/17/2018   HGBA1C 10.5 (A) 03/11/2018    Review of Glycemic Control Results for Victoria Padilla, Victoria Padilla (MRN [id number]) as of 08/17/2018 07:36  Ref. Range 08/15/2018 13:21 08/15/2018 21:32 08/16/2018 04:40 08/16/2018 06:23 08/16/2018 11:22 08/16/2018 15:44 08/16/2018 21:40 08/17/2018 06:04  Glucose-Capillary Latest Ref Range: 70 - 99 mg/dL 349 (H) 314 (H) 299 (H) 299 (H) 320 (H) 242 (H) 177 (H) 166 (H)   Diabetes history: DM 2 Outpatient Diabetes medications: Basaglar 35 units q AM, Metformin 2000 mg q AM, Farxiga 5 mg daily Current orders for Inpatient glycemic control:  Novolog sensitive tid with meals and HS  Inpatient Diabetes Program Recommendations:    Please add Lantus 15 units daily while in the hospital.    Thanks,  Adah Perl, RN, BC-ADM Inpatient Diabetes Coordinator Pager [phone] (8a-5p)

## 2018-08-17 NOTE — Plan of Care (Signed)

## 2018-08-17 NOTE — Plan of Care (Signed)
  Problem: Clinical Measurements: Goal: Ability to maintain clinical measurements within normal limits will improve Outcome: Progressing   Problem: Clinical Measurements: Goal: Cardiovascular complication will be avoided Outcome: Progressing   Problem: Nutrition: Goal: Adequate nutrition will be maintained Outcome: Progressing   

## 2018-08-17 NOTE — Progress Notes (Signed)
[place] for Heparin Indication: pulmonary embolus  Allergies  Allergen Reactions  . Naprosyn [Naproxen] Nausea Only    Patient Measurements: Height: 5\' 3"  (160 cm) Weight: 260 lb (117.9 kg)(pts stated weight. too nausous to weigh at this time) IBW/kg (Calculated) : 52.4 Heparin Dosing Weight: 81.2 kg   Vital Signs: Temp: 97.9 F (36.6 C) (06/22 1514) Temp Source: Oral (06/22 1514) BP: 146/91 (06/22 1515) Pulse Rate: 83 (06/22 1117)  Labs: Recent Labs    08/15/18 1400 08/15/18 1456  08/16/18 1133 08/16/18 1920 08/17/18 0840 08/17/18 1015 08/17/18 1832  HGB 12.5 14.6  --  12.0  --  12.2  --   --   HCT 42.1 43.0  --  39.8  --  40.0  --   --   PLT 167  --   --  164  --  124*  --   --   LABPROT 13.7  --   --  14.7  --   --   --   --   INR 1.1  --   --  1.2  --   --   --   --   HEPARINUNFRC  --   --    < > 0.95* 0.90*  --  0.17* 0.31  CREATININE 0.77  --   --  0.98  --   --  0.92  --   TROPONINI 0.06*  --   --   --   --   --   --   --    < > = values in this interval not displayed.    Estimated Creatinine Clearance: 85.7 mL/min (by C-G formula based on SCr of 0.92 mg/dL).  Assessment: 53 y.o. female on heparin for acute bilateral PE with right heart strain and bilateral DVTs. Pharmacy consulted to dose heparin. Heparin level ~7.5 hrs after heparin rate change to 1050 units/hr was 0.31, which is within the goal range. RN reports no signs/symptoms of bleeding at this time.  Goal of Therapy:  Heparin level 0.3-0.7 units/ml Monitor platelets by anticoagulation protocol: Yes   Plan:  Continue IV heparin infusion at 1050 units/hr Recheck heparin level in 6 hrs Daily HL and CBC while on heparin Monitor for signs/symptoms of bleeding F/U plans for oral anticoagulation eventually  Gillermina Hu, PharmD, BCPS, [facility] Clinical Pharmacist  08/17/2018 7:40 PM

## 2018-08-17 NOTE — Progress Notes (Addendum)
[facility]                                                                              Patient Demographics  Victoria Padilla, is a 53 y.o. female, DOB - 05/15/1965, RSW:[id number]  Admit date - 08/15/2018   Admitting Physician Merton Border, MD  Outpatient Primary MD for the patient is [name], NP-C  Outpatient specialists:   LOS - 2  days   Medical records reviewed and are as summarized below:    No chief complaint on file.      Brief summary   Patient is a 53 year old with history of diabetes type 2, hypertension, hyperlipidemia, OSA presented to ED with shortness of breath.  In ED, patient was found to be tachycardiac, somewhat confused.  She reported mild chest pressure with nausea and vomiting. CT angiogram of the chest was done which showed bilateral PEs with RV strain.  She was not started on lytic therapy due to the fact that she was hemodynamically stable, on O2 2 L.  Patient was started on heparin drip and admitted for further work-up. COVID-19 test negative  Assessment & Plan   Acute respiratory failure with hypoxia secondary to bilateral acute pulmonary embolism with RV strain -Per patient, she has been working from home and with sedentary lifestyle.  Still hypoxic and symptomatic with shortness of breath, chest tightness, nausea, vomiting.  Lower extremities mildly swollen. -Doppler ultrasound of the lower extremities shows extensive DVT in bilateral lower extremities.  -2D echo showed EF of 60 to 65% with mildly reduced right ventricular systolic function -Discussed with vascular surgery, pulmonology, interventional neuroradiology, no indication for catheter directed thrombolysis at this time.   -Hypercoagulable work-up in progress -Up-to-date on cancer screening, negative.  Follow CEA, CA-19-9. -Still hypoxic, O2 sats in 80s on room air, placed back on 3.5 L, will continue IV heparin today -If hypoxia improving tomorrow, will start  NOAC   Active Problems: Nausea and vomiting -Likely due to #1, improving, advance to full liquid diet, continue antiemetics  Hypertension -BP better today.  Continue to hold Lotensin, HCTZ  -Continue beta-blocker   Diabetes mellitus type 2, uncontrolled -HbA1c 10.5 in 03/11/2018 -Repeat hemoglobin A1c, continue sliding scale insulin, added Lantus 15 units daily -Outpatient she is on metformin and Farxiga  Obesity BMI 46, counseled patient for diet and weight control  Code Status: Full CODE STATUS DVT Prophylaxis: Heparin drip Family Communication: Discussed in detail with the patient, all imaging results, lab results explained to the patient   Disposition Plan: Still symptomatic and hypoxic, continue progressive care today  Time Spent in minutes 35 minutes  Procedures:  2D echo Doppler ultrasound of the lower extremities Summary: Right: Findings consistent with acute deep vein thrombosis involving the right femoral vein, right proximal profunda vein, right popliteal vein, right posterior tibial veins, and right peroneal veins. Left: Findings consistent with acute deep vein thrombosis involving the distal left femoral vein, left popliteal vein, left posterior tibial veins, and left peroneal veins.  Consultants:   Discussed with pulmonology/Dr. Lamonte Sakai, vascular surgery/Dr. Carlis Abbott, IR/Dr. Anselm Pancoast on 6/21  Antimicrobials:   Anti-infectives (From admission, onward)  None         Medications  Scheduled Meds:  insulin aspart  0-5 Units Subcutaneous QHS   insulin aspart  0-9 Units Subcutaneous TID WC   ipratropium-albuterol  3 mL Nebulization TID   metoprolol tartrate  25 mg Oral BID   ondansetron (ZOFRAN) IV  4 mg Intravenous Q8H   simvastatin  20 mg Oral QHS   sodium chloride flush  3 mL Intravenous Q12H   Continuous Infusions:  sodium chloride 10 mL (08/17/18 0514)   heparin 900 Units/hr (08/16/18 2117)   PRN Meds:.sodium chloride, acetaminophen **OR**  acetaminophen, prochlorperazine, sodium chloride flush, sodium chloride flush, zolpidem      Subjective:   Victoria Padilla was seen and examined today.  Still feels somewhat dyspneic and tired.  O2 sats down to 80s on room air, placed back on 3.5 L.  No vomiting, nausea improving.  Wants to try advancing diet.  No abdominal pain.    Objective:   Vitals:   08/17/18 0758 08/17/18 0800 08/17/18 0815 08/17/18 1046  BP: (!) 167/104 (!) 167/104 (!) 155/89 (!) 156/94  Pulse: 93 91  84  Resp: (!) 22 18  20   Temp: 98.7 F (37.1 C) 98.7 F (37.1 C)  97.9 F (36.6 C)  TempSrc: Oral Oral  Oral  SpO2: 94% 92% 94% 94%  Weight:      Height:        Intake/Output Summary (Last 24 hours) at 08/17/2018 1102 Last data filed at 08/17/2018 8768 Gross per 24 hour  Intake 1553.5 ml  Output 240 ml  Net 1313.5 ml     Wt Readings from Last 3 Encounters:  08/15/18 117.9 kg  04/06/18 116.8 kg  03/11/18 117.1 kg   Physical Exam  General: Alert and oriented x 3, NAD  Eyes:   HEENT:  Atraumatic, normocephalic  Cardiovascular: S1 S2 clear, no murmurs, RRR.  Respiratory: CTAB, no wheezing, rales or rhonchi  Gastrointestinal: Soft, nontender, nondistended, NBS  Ext: no pedal edema bilaterally  Neuro: no new deficits  Musculoskeletal: No cyanosis, clubbing  Skin: No rashes  Psych: Normal affect and demeanor, alert and oriented x3     Data Reviewed:  I have personally reviewed following labs and imaging studies  Micro Results Recent Results (from the past 240 hour(s))  Culture, blood (routine x 2)     Status: None (Preliminary result)   Collection Time: 08/15/18  2:00 PM   Specimen: BLOOD  Result Value Ref Range Status   Specimen Description BLOOD SITE NOT SPECIFIED  Final   Special Requests   Final    BOTTLES DRAWN AEROBIC AND ANAEROBIC Blood Culture adequate volume   Culture   Final    NO GROWTH 2 DAYS Performed at [facility], [address] [place], [place]  [address]    Report Status PENDING  Incomplete  SARS Coronavirus 2 (CEPHEID - Performed in [place] hospital lab), Hosp Order     Status: None   Collection Time: 08/15/18  2:03 PM   Specimen: Nasopharyngeal Swab  Result Value Ref Range Status   SARS Coronavirus 2 NEGATIVE NEGATIVE Final    Comment: (NOTE) If result is NEGATIVE SARS-CoV-2 target nucleic acids are NOT DETECTED. The SARS-CoV-2 RNA is generally detectable in upper and lower  respiratory specimens during the acute phase of infection. The lowest  concentration of SARS-CoV-2 viral copies this assay can detect is 250  copies / mL. A negative result does not preclude SARS-CoV-2 infection  and  should not be used as the sole basis for treatment or other  patient management decisions.  A negative result may occur with  improper specimen collection / handling, submission of specimen other  than nasopharyngeal swab, presence of viral mutation(s) within the  areas targeted by this assay, and inadequate number of viral copies  (<250 copies / mL). A negative result must be combined with clinical  observations, patient history, and epidemiological information. If result is POSITIVE SARS-CoV-2 target nucleic acids are DETECTED. The SARS-CoV-2 RNA is generally detectable in upper and lower  respiratory specimens dur ing the acute phase of infection.  Positive  results are indicative of active infection with SARS-CoV-2.  Clinical  correlation with patient history and other diagnostic information is  necessary to determine patient infection status.  Positive results do  not rule out bacterial infection or co-infection with other viruses. If result is PRESUMPTIVE POSTIVE SARS-CoV-2 nucleic acids MAY BE PRESENT.   A presumptive positive result was obtained on the submitted specimen  and confirmed on repeat testing.  While 2019 novel coronavirus  (SARS-CoV-2) nucleic acids may be present in the submitted sample  additional confirmatory  testing may be necessary for epidemiological  and / or clinical management purposes  to differentiate between  SARS-CoV-2 and other Sarbecovirus currently known to infect humans.  If clinically indicated additional testing with an alternate test  methodology [phone]) is advised. The SARS-CoV-2 RNA is generally  detectable in upper and lower respiratory sp ecimens during the acute  phase of infection. The expected result is Negative. Fact Sheet for Patients:  StrictlyIdeas.no Fact Sheet for Healthcare Providers: BankingDealers.co.za This test is not yet approved or cleared by the [place] FDA and has been authorized for detection and/or diagnosis of SARS-CoV-2 by FDA under an Emergency Use Authorization (EUA).  This EUA will remain in effect (meaning this test can be used) for the duration of the COVID-19 declaration under Section 564(b)(1) of the Act, 21 U.S.C. section 360bbb-3(b)(1), unless the authorization is terminated or revoked sooner. Performed at [facility], [place] [address] [place], [place] [address]   Culture, blood (routine x 2)     Status: None (Preliminary result)   Collection Time: 08/15/18  4:08 PM   Specimen: BLOOD  Result Value Ref Range Status   Specimen Description BLOOD LEFT ANTECUBITAL  Final   Special Requests   Final    BOTTLES DRAWN AEROBIC AND ANAEROBIC Blood Culture adequate volume   Culture   Final    NO GROWTH 2 DAYS Performed at [facility], [place] [address] [place], [place] [address]    Report Status PENDING  Incomplete  MRSA PCR Screening     Status: None   Collection Time: 08/15/18  8:43 PM   Specimen: Nasal Mucosa; Nasopharyngeal  Result Value Ref Range Status   MRSA by PCR NEGATIVE NEGATIVE Final    Comment:        The GeneXpert MRSA Assay (FDA approved for NASAL specimens only), is one component of a comprehensive MRSA colonization surveillance program. It is not intended  to diagnose MRSA infection nor to guide or monitor treatment for MRSA infections. Performed at [facility], [place] [address] [place], [place] [address]     Radiology Reports Ct Head Wo Contrast  Result Date: 08/15/2018 CLINICAL DATA:  Witnessed syncopal episode today. EXAM: CT HEAD WITHOUT CONTRAST TECHNIQUE: Contiguous axial images were obtained from the base of the skull through the vertex without intravenous contrast.  COMPARISON:  None. FINDINGS: Brain: Ventricles, cisterns and other CSF spaces are normal. There is no mass, mass effect, shift of midline structures or acute hemorrhage. No evidence of acute infarction. Vascular: No hyperdense vessel or unexpected calcification. Skull: Normal. Negative for fracture or focal lesion. Sinuses/Orbits: No acute finding. Other: None. IMPRESSION: No acute findings. Electronically Signed   By: Marin Olp M.D.   On: 08/15/2018 15:19   Ct Angio Chest Pe W/cm &/or Wo Cm  Result Date: 08/15/2018 CLINICAL DATA:  Chest pain.  Evaluate for pulmonary embolism. EXAM: CT ANGIOGRAPHY CHEST WITH CONTRAST TECHNIQUE: Multidetector CT imaging of the chest was performed using the standard protocol during bolus administration of intravenous contrast. Multiplanar CT image reconstructions and MIPs were obtained to evaluate the vascular anatomy. CONTRAST:  22mL OMNIPAQUE IOHEXOL 350 MG/ML SOLN COMPARISON:  12/15/2004; chest radiograph-earlier same day FINDINGS: Vascular Findings: There is adequate opacification of the pulmonary arterial system with the main pulmonary artery measuring 410 Hounsfield units. Mixed occlusive and nonocclusive filling defects are seen within segmental and subsegmental pulmonary arteries bilaterally. The main and majority of the right and left pulmonary arteries remain patent without more central filling defect. Normal caliber the main pulmonary artery. While the overall clot burden is deemed moderate volume, there is bowing of the  intraventricular septum with abnormal right to left ventricular ratio of 2.0 as could be seen in the setting right-sided heart strain. No evidence of evolving pulmonary infarction Cardiomegaly.  No pericardial effusion. Normal caliber the thoracic aorta. Note is made of an aberrant right subclavian artery which courses posterior to the trachea and the esophagus. Review of the MIP images confirms the above findings. ---------------------------------------------------------------------------------- Nonvascular Findings: Mediastinum/Lymph Nodes: Scattered partially calcified mediastinal, prevascular and bilateral hilar lymph nodes compatible with known history of sarcoidosis. No bulky mediastinal, hilar axillary lymphadenopathy. Lungs/Pleura: Ill-defined ground-glass within the left upper lobe (represent image 22, series 7), is favored to represent an area of transient atelectasis given patient respiratory artifact. No discrete focal airspace opacities. No pleural effusion or pneumothorax. The central pulmonary airways appear widely patent. No discrete pulmonary nodules given limitation of the examination. Upper abdomen: Limited early arterial phase evaluation of the upper abdomen demonstrates diffuse decreased attenuation of the hepatic parenchyma with nodularity of the hepatic contour suggestive of cirrhosis. The spleen is noted to be atretic. Musculoskeletal: No acute or aggressive osseous abnormalities. Regional soft tissues appear normal. Normal appearance of the thyroid gland. IMPRESSION: 1. Positive for acute bilateral moderate volume pulmonary embolism with CT evidence of right heart strain (RV/LV Ratio = 2.0) consistent with at least submassive (intermediate risk) PE. The presence of right heart strain has been associated with an increased risk of morbidity and mortality. Please activate Code PE by paging [phone]. 2. Decreased attenuation of the hepatic parenchyma with nodularity hepatic contour  compatible with hepatic cirrhosis, potentially progressed compared to the 11/2004 examination. 3. Partially calcified mediastinal and bilateral hilar lymph nodes compatible provided history of sarcoidosis. 4. Incidentally noted aberrant right subclavian artery. Critical Value/emergent results were called by telephone at the time of interpretation on 08/15/2018 at 4:54 pm to Dr. Ronnald Nian, who verbally acknowledged these results. Electronically Signed   By: Sandi Mariscal M.D.   On: 08/15/2018 16:56   Dg Chest Port 1 View  Result Date: 08/15/2018 CLINICAL DATA:  Syncope EXAM: PORTABLE CHEST 1 VIEW COMPARISON:  02/13/2018 FINDINGS: Mildly low lung volume. Borderline cardiomegaly. No focal consolidation or effusion. No pneumothorax. IMPRESSION: No active disease.  Low lung volumes. Electronically  Signed   By: Donavan Foil M.D.   On: 08/15/2018 15:13   Vas [place] Lower Extremity Venous (dvt)  Result Date: 08/16/2018  Lower Venous Study Indications: Pulmonary embolism.  Risk Factors: Confirmed PE. Comparison Study: No prior study on file for comparison. Performing Technologist: Sharion Dove RVS  Examination Guidelines: A complete evaluation includes B-mode imaging, spectral Doppler, color Doppler, and power Doppler as needed of all accessible portions of each vessel. Bilateral testing is considered an integral part of a complete examination. Limited examinations for reoccurring indications may be performed as noted.  +---------+---------------+---------+-----------+----------+-------+  RIGHT     Compressibility Phasicity Spontaneity Properties Summary  +---------+---------------+---------+-----------+----------+-------+  CFV       Full            Yes       Yes                             +---------+---------------+---------+-----------+----------+-------+  SFJ       Full                                                      +---------+---------------+---------+-----------+----------+-------+  FV Prox   Partial                                           Acute    +---------+---------------+---------+-----------+----------+-------+  FV Mid    Partial                                          Acute    +---------+---------------+---------+-----------+----------+-------+  FV Distal Partial                                          Acute    +---------+---------------+---------+-----------+----------+-------+  PFV       Partial                                          Acute    +---------+---------------+---------+-----------+----------+-------+  POP       None            No        No                     Acute    +---------+---------------+---------+-----------+----------+-------+  PTV       None                                             Acute    +---------+---------------+---------+-----------+----------+-------+  PERO      None  Acute    +---------+---------------+---------+-----------+----------+-------+   +---------+---------------+---------+-----------+----------+-------+  LEFT      Compressibility Phasicity Spontaneity Properties Summary  +---------+---------------+---------+-----------+----------+-------+  CFV       Full            Yes       Yes                             +---------+---------------+---------+-----------+----------+-------+  SFJ       Full                                                      +---------+---------------+---------+-----------+----------+-------+  FV Prox   Full                                                      +---------+---------------+---------+-----------+----------+-------+  FV Mid    Full                                                      +---------+---------------+---------+-----------+----------+-------+  FV Distal None                                             Acute    +---------+---------------+---------+-----------+----------+-------+  PFV       Full                                                       +---------+---------------+---------+-----------+----------+-------+  POP       None                                             Acute    +---------+---------------+---------+-----------+----------+-------+  PTV       None                                             Acute    +---------+---------------+---------+-----------+----------+-------+  PERO      None                                             Acute    +---------+---------------+---------+-----------+----------+-------+     Summary: Right: Findings consistent with acute deep vein thrombosis involving the right femoral vein, right proximal profunda vein, right popliteal vein, right posterior tibial veins, and right peroneal veins. Left: Findings consistent with acute deep vein thrombosis involving the distal left femoral vein,  left popliteal vein, left posterior tibial veins, and left peroneal veins.  *See table(s) above for measurements and observations. Electronically signed by Monica Martinez MD on 08/16/2018 at 10:01:15 AM.    Final     Lab Data:  CBC: Recent Labs  Lab 08/15/18 1400 08/15/18 1456 08/16/18 1133 08/17/18 0840  WBC 12.2*  --  10.0 9.3  NEUTROABS 7.8*  --   --   --   HGB 12.5 14.6 12.0 12.2  HCT 42.1 43.0 39.8 40.0  MCV 75.6*  --  74.1* 74.2*  PLT 167  --  164 329*   Basic Metabolic Panel: Recent Labs  Lab 08/15/18 1400 08/15/18 1456 08/16/18 1133  NA 140 139 136  K 3.7 4.3 4.2  CL 105  --  102  CO2 20*  --  24  GLUCOSE 370*  --  316*  BUN 12  --  15  CREATININE 0.77  --  0.98  CALCIUM 9.2  --  8.9  MG 1.7  --   --   PHOS 3.8  --   --    GFR: Estimated Creatinine Clearance: 80.5 mL/min (by C-G formula based on SCr of 0.98 mg/dL). Liver Function Tests: Recent Labs  Lab 08/15/18 1400  AST 164*  ALT 122*  ALKPHOS 130*  BILITOT 0.5  PROT 7.6  ALBUMIN 3.5   No results for input(s): LIPASE, AMYLASE in the last 168 hours. No results for input(s): AMMONIA in the last 168 hours. Coagulation  Profile: Recent Labs  Lab 08/15/18 1400 08/16/18 1133  INR 1.1 1.2   Cardiac Enzymes: Recent Labs  Lab 08/15/18 1400  TROPONINI 0.06*   BNP (last 3 results) No results for input(s): PROBNP in the last 8760 hours. HbA1C: No results for input(s): HGBA1C in the last 72 hours. CBG: Recent Labs  Lab 08/16/18 0623 08/16/18 1122 08/16/18 1544 08/16/18 2140 08/17/18 0604  GLUCAP 299* 320* 242* 177* 166*   Lipid Profile: No results for input(s): CHOL, HDL, LDLCALC, TRIG, CHOLHDL, LDLDIRECT in the last 72 hours. Thyroid Function Tests: Recent Labs    08/15/18 1432  TSH 0.955   Anemia Panel: No results for input(s): VITAMINB12, FOLATE, FERRITIN, TIBC, IRON, RETICCTPCT in the last 72 hours. Urine analysis:    Component Value Date/Time   COLORURINE YELLOW 09/11/2013 [place] 09/11/2013 1323   LABSPEC 1.030 05/08/2017 0921   PHURINE 8.0 09/11/2013 1323   GLUCOSEU NEGATIVE 09/11/2013 1323   HGBUR NEGATIVE 09/11/2013 1323   BILIRUBINUR negative 05/08/2017 0921   BILIRUBINUR n 12/14/2015 1638   KETONESUR negative 05/08/2017 [place] 09/11/2013 1323   PROTEINUR trace (A) 05/08/2017 0921   PROTEINUR n 12/14/2015 1638   PROTEINUR NEGATIVE 09/11/2013 1323   UROBILINOGEN negative 12/14/2015 1638   UROBILINOGEN 1.0 09/11/2013 1323   NITRITE Negative 05/08/2017 0921   NITRITE n 12/14/2015 1638   NITRITE NEGATIVE 09/11/2013 1323   LEUKOCYTESUR Negative 05/08/2017 0921     Victoria Padilla M.D. [facility] 08/17/2018, 11:02 AM  Pager: [phone] Between 7am to 7pm - call Pager - [phone]  After 7pm go to [email or web address] - password [email or web address]  Call night coverage person covering after 7pm

## 2018-08-18 LAB — PROTEIN C, TOTAL: Protein C, Total: 102 % (ref 60–150)

## 2018-08-18 LAB — CBC
HCT: 36.1 % (ref 36.0–46.0)
Hemoglobin: 11.1 g/dL — ABNORMAL LOW (ref 12.0–15.0)
MCH: 22.6 pg — ABNORMAL LOW (ref 26.0–34.0)
MCHC: 30.7 g/dL (ref 30.0–36.0)
MCV: 73.5 fL — ABNORMAL LOW (ref 80.0–100.0)
Platelets: 163 10*3/uL (ref 150–400)
RBC: 4.91 MIL/uL (ref 3.87–5.11)
RDW: 14.7 % (ref 11.5–15.5)
WBC: 11.5 10*3/uL — ABNORMAL HIGH (ref 4.0–10.5)
nRBC: 0 % (ref 0.0–0.2)

## 2018-08-18 LAB — CARDIOLIPIN ANTIBODIES, IGG, IGM, IGA
Anticardiolipin IgA: <9 APL U/mL (ref 0–11)
Anticardiolipin IgG: <9 GPL U/mL (ref 0–14)
Anticardiolipin IgM: <9 MPL U/mL (ref 0–12)

## 2018-08-18 LAB — HOMOCYSTEINE: Homocysteine: 8.6 umol/L (ref 0.0–14.5)

## 2018-08-18 LAB — BETA-2-GLYCOPROTEIN I ABS, IGG/M/A
Beta-2 Glyco I IgG: <9 GPI IgG units (ref 0–20)
Beta-2-Glycoprotein I IgA: <9 GPI IgA units (ref 0–25)
Beta-2-Glycoprotein I IgM: <9 GPI IgM units (ref 0–32)

## 2018-08-18 LAB — BASIC METABOLIC PANEL
Anion gap: 8 (ref 5–15)
BUN: 9 mg/dL (ref 6–20)
CO2: 28 mmol/L (ref 22–32)
Calcium: 9.1 mg/dL (ref 8.9–10.3)
Chloride: 100 mmol/L (ref 98–111)
Creatinine, Ser: 0.95 mg/dL (ref 0.44–1.00)
GFR calc Af Amer: >60 mL/min (ref >60)
GFR calc non Af Amer: >60 mL/min (ref >60)
Glucose, Bld: 180 mg/dL — ABNORMAL HIGH (ref 70–99)
Potassium: 3.3 mmol/L — ABNORMAL LOW (ref 3.5–5.1)
Sodium: 136 mmol/L (ref 135–145)

## 2018-08-18 LAB — GLUCOSE, CAPILLARY
Glucose-Capillary: 195 mg/dL — ABNORMAL HIGH (ref 70–99)
Glucose-Capillary: 223 mg/dL — ABNORMAL HIGH (ref 70–99)

## 2018-08-18 LAB — HEMOGLOBIN A1C
Hgb A1c MFr Bld: 11.1 % — ABNORMAL HIGH (ref 4.8–5.6)
Mean Plasma Glucose: 271.87 mg/dL

## 2018-08-18 LAB — CEA: CEA: 2.2 ng/mL (ref 0.0–4.7)

## 2018-08-18 LAB — CANCER ANTIGEN 19-9: CA 19-9: 10 U/mL (ref 0–35)

## 2018-08-18 LAB — HEPARIN LEVEL (UNFRACTIONATED): Heparin Unfractionated: 0.31 IU/mL (ref 0.30–0.70)

## 2018-08-18 MED ORDER — METOPROLOL TARTRATE 25 MG PO TABS: 25.0000 mg | ORAL_TABLET | Freq: Two times a day (BID) | ORAL | 4 refills | Status: DC

## 2018-08-18 MED ORDER — BENZONATATE 100 MG PO CAPS: 100.0000 mg | ORAL_CAPSULE | Freq: Three times a day (TID) | ORAL | 0 refills | Status: DC | PRN

## 2018-08-18 MED ORDER — RIVAROXABAN (XARELTO) VTE STARTER PACK (15 & 20 MG): ORAL_TABLET | ORAL | 0 refills | Status: DC

## 2018-08-18 MED ORDER — ONDANSETRON 4 MG PO TBDP: 4.0000 mg | ORAL_TABLET | Freq: Three times a day (TID) | ORAL | 0 refills | Status: DC | PRN

## 2018-08-18 MED ORDER — ALBUTEROL SULFATE HFA 108 (90 BASE) MCG/ACT IN AERS: 2.0000 | INHALATION_SPRAY | Freq: Four times a day (QID) | RESPIRATORY_TRACT | 4 refills | Status: DC | PRN

## 2018-08-18 MED ORDER — RIVAROXABAN 20 MG PO TABS: 20.0000 mg | ORAL_TABLET | Freq: Every day | ORAL | 4 refills | Status: DC

## 2018-08-18 MED ORDER — RIVAROXABAN 20 MG PO TABS: 20.0000 mg | ORAL_TABLET | Freq: Every day | ORAL | Status: DC

## 2018-08-18 MED ORDER — RIVAROXABAN 15 MG PO TABS
15.0000 mg | ORAL_TABLET | Freq: Two times a day (BID) | ORAL | Status: DC
  Administered 2018-08-18: 07:00:00 15 mg via ORAL
  Filled 2018-08-18 (×2): qty 1

## 2018-08-18 MED FILL — XARELTO 20 MG TABLET: 20 | 30 days supply | Qty: 30 | Fill #0

## 2018-08-18 MED FILL — BENZONATATE 100 MG CAP: 100 | 10 days supply | Qty: 30 | Fill #0

## 2018-08-18 MED FILL — METOPROLOL TARTRATE 25 MG T: 25 | 30 days supply | Qty: 60 | Fill #0

## 2018-08-18 MED FILL — XARELTO STARTER PACK: 15 & 20 | 30 days supply | Qty: 51 | Fill #0

## 2018-08-18 MED FILL — ONDANSETRON ODT 4 MG TABLET: 4 | 7 days supply | Qty: 20 | Fill #0

## 2018-08-18 NOTE — Progress Notes (Signed)
Pt got discharged to home, discharge instructions provided and patient showed understanding to it, IV taken out,Telemonitor DC,pt left unit in wheelchair with all of the belongings (oxygen, walker from home-health) accompanied with a family member (Daughter)  Palma Holter, Therapist, sports

## 2018-08-18 NOTE — Progress Notes (Signed)
SATURATION QUALIFICATIONS: (This note is used to comply with regulatory documentation for home oxygen)  Patient Saturations on Room Air at Rest = 88%  Patient Saturations on Room Air while Ambulating = 88%  Patient Saturations on 2 Liters of oxygen while Ambulating = 93 %  Pt need home oxygen, when pt walk she got winded and she struggles breathing and her saturation comes down to 88%  [place] Holter, Therapist, sports

## 2018-08-18 NOTE — Progress Notes (Signed)
[place] for Xarelto Indication: pulmonary embolus  Allergies  Allergen Reactions  . Naprosyn [Naproxen] Nausea Only    Patient Measurements: Height: 5\' 3"  (160 cm) Weight: 260 lb (117.9 kg)(pts stated weight. too nausous to weigh at this time) IBW/kg (Calculated) : 52.4 Heparin Dosing Weight: 81.2 kg   Vital Signs: Temp: 98.3 F (36.8 C) (06/23 0326) Temp Source: Oral (06/23 0326) BP: 140/85 (06/23 0326) Pulse Rate: 91 (06/23 0326)  Labs: Recent Labs    08/15/18 1400  08/16/18 1133  08/17/18 0840 08/17/18 1015 08/17/18 1832 08/18/18 0310 08/18/18 0313  HGB 12.5   < > 12.0  --  12.2  --   --  11.1*  --   HCT 42.1   < > 39.8  --  40.0  --   --  36.1  --   PLT 167  --  164  --  124*  --   --  163  --   LABPROT 13.7  --  14.7  --   --   --   --   --   --   INR 1.1  --  1.2  --   --   --   --   --   --   HEPARINUNFRC  --    < > 0.95*   < >  --  0.17* 0.31  --  0.31  CREATININE 0.77  --  0.98  --   --  0.92  --  0.95  --   TROPONINI 0.06*  --   --   --   --   --   --   --   --    < > = values in this interval not displayed.    Estimated Creatinine Clearance: 83 mL/min (by C-G formula based on SCr of 0.95 mg/dL).  Assessment: 53 y.o. female with PE/DVT for Xarelto  Goal of Therapy:  Heparin level 0.3-0.7 units/ml Monitor platelets by anticoagulation protocol: Yes   Plan:  D/C heparin Xarelto 15 mg BID x 21 days, then 20 mg daily  Phillis Knack, PharmD, BCPS   08/18/2018 6:21 AM

## 2018-08-18 NOTE — Discharge Summary (Signed)
Physician Discharge Summary   Patient ID: Victoria Padilla MRN: [id number] DOB/AGE: 05-13-1965 53 y.o.  Admit date: 08/15/2018 Discharge date: 08/18/2018  Primary Care Physician:  [name], NP-C   Recommendations for Outpatient Follow-up:  1. Follow up with PCP in 1-2 weeks 2. Patient started on Xarelto 15 mg twice daily for 21 days then switch to 20 mg  daily with supper for acute bilateral PE and acute bilateral DVT.    Home Health: Home health PT Equipment/Devices: DME O2, DME rolling walker with 5 inches wheels  Home O2 evaluation SATURATION QUALIFICATIONS: (This note is used to comply with regulatory documentation for home oxygen)  Patient Saturations on Room Air at Rest = 91%, 97% on 2LO2  Patient Saturations on Room Air while Ambulating = 83%  Patient Saturations on 2 Liters of oxygen while Ambulating = 94%    Discharge Condition: stable  CODE STATUS: FULL  Diet recommendation: Carb modified diet   Discharge Diagnoses:   Acute respiratory failure with hypoxia . Acute bilateral pulmonary embolism (HCC) Acute bilateral DVT . Hyperlipidemia associated with type 2 diabetes mellitus ([place]) . OSA (obstructive sleep apnea) . Sarcoidosis . Uncontrolled hypertension . Uncontrolled type 2 diabetes mellitus with hyperglycemia (HCC)   Consults:  Discussed via phone with pulmonology, interventional radiology, vascular surgery    Allergies:   Allergies  Allergen Reactions  . Naprosyn [Naproxen] Nausea Only     DISCHARGE MEDICATIONS: Allergies as of 08/18/2018      Reactions   Naprosyn [naproxen] Nausea Only      Medication List    STOP taking these medications   amLODipine 5 MG tablet Commonly known as: NORVASC   aspirin 81 MG tablet   hydrochlorothiazide 25 MG tablet Commonly known as: HYDRODIURIL   ibuprofen 200 MG tablet Commonly known as: ADVIL     TAKE these medications   albuterol 108 (90 Base) MCG/ACT inhaler Commonly known as:  VENTOLIN HFA Inhale 2 puffs into the lungs every 6 (six) hours as needed for wheezing or shortness of breath. What changed: reasons to take this   B-D ULTRAFINE III SHORT PEN 31G X 8 MM Misc Generic drug: Insulin Pen Needle   Basaglar KwikPen 100 UNIT/ML Sopn Inject 0.35 mLs (35 Units total) into the skin every morning. And pen needles 1/day   benazepril 40 MG tablet Commonly known as: LOTENSIN TAKE 1 TABLET BY MOUTH EVERY DAY What changed: when to take this   benzonatate 100 MG capsule Commonly known as: TESSALON Take 1 capsule (100 mg total) by mouth 3 (three) times daily as needed for cough.   cetirizine 10 MG tablet Commonly known as: ZYRTEC Take 1 tablet (10 mg total) by mouth daily. What changed:   when to take this  reasons to take this   cholecalciferol 1000 units tablet Commonly known as: VITAMIN D Take 2,000 Units by mouth at bedtime.   dapagliflozin propanediol 5 MG Tabs tablet Commonly known as: Farxiga Take 5 mg by mouth daily. What changed: when to take this   FISH OIL PO Take 1 capsule by mouth at bedtime. Reported on 08/08/2015   fluticasone 50 MCG/ACT nasal spray Commonly known as: FLONASE Place 2 sprays into both nostrils daily as needed (seasonal allergies).   glucose blood test strip Commonly known as: OneTouch Verio 1 each by Other route 2 (two) times daily. And lancets 2/day   ipratropium 0.06 % nasal spray Commonly known as: Atrovent Place 2 sprays into both nostrils 4 (four) times daily.  metFORMIN 500 MG 24 hr tablet Commonly known as: GLUCOPHAGE-XR TAKE 4 TABLETS (2,000 MG TOTAL) BY MOUTH DAILY WITH BREAKFAST. What changed: See the new instructions.   metoprolol tartrate 25 MG tablet Commonly known as: LOPRESSOR Take 1 tablet (25 mg total) by mouth 2 (two) times daily.   multivitamin with minerals Tabs tablet Take 1 tablet by mouth at bedtime.   Olopatadine HCl 0.2 % Soln Apply 1 drop to eye daily. What changed:   how to  take this  when to take this  reasons to take this   ondansetron 4 MG disintegrating tablet Commonly known as: Zofran ODT Take 1 tablet (4 mg total) by mouth every 8 (eight) hours as needed for nausea or vomiting.   OneTouch Delica Lancets 61Y Misc Test twice a day. Pt uses a one touch verio flex meter   Rivaroxaban 15 & 20 MG Tbpk Start with one 15mg  tablet by mouth twice a day with food. On Day 22 (09/08/2018), switch to one 20mg  tablet with supper   rivaroxaban 20 MG Tabs tablet Commonly known as: Xarelto Take 1 tablet (20 mg total) by mouth daily with supper. Start taking on: September 08, 2018   simvastatin 20 MG tablet Commonly known as: ZOCOR TAKE 1 TABLET BY MOUTH EVERYDAY AT BEDTIME What changed: See the new instructions.            Durable Medical Equipment  (From admission, onward)         Start     Ordered   08/18/18 1147  For home use only DME Walker rolling  Once    Question:  Patient needs a walker to treat with the following condition  Answer:  Pulmonary embolism ([place])   08/18/18 1147           Brief H and P: For complete details please refer to admission H and P, but in brief Patient is a 53 year old with history of diabetes type 2, hypertension, hyperlipidemia, OSA presented to ED with shortness of breath.  In ED, patient was found to be tachycardiac, somewhat confused.  She reported mild chest pressure with nausea and vomiting. CT angiogram of the chest was done which showed bilateral PEs with RV strain.  She was not started on lytic therapy due to the fact that she was hemodynamically stable, on O2 2 L.  Patient was started on heparin drip and admitted for further work-up. COVID-19 test negative   Hospital Course:    Acute respiratory failure with hypoxia secondary to bilateral acute pulmonary embolism with RV strain -Per patient, she has been working from home and with sedentary lifestyle, presented with chest tightness, shortness of breath,  nausea vomiting.   -Patient was hypoxic on admission, required 4 L O2 via nasal cannula to maintain sats above 90%.  Patient was placed on IV heparin drip, gradually improving, home O2 evaluation was done and needs 2 L O2 with exertion  -Doppler ultrasound of the lower extremities shows extensive DVT in bilateral lower extremities.  -2D echo showed EF of 60 to 65% with mildly reduced right ventricular systolic function -Discussed with vascular surgery, pulmonology, interventional neuroradiology, no indication for catheter directed thrombolysis at this time.   -Hypercoagulable work-up in progress, needs to be followed outpatient. -She is up-to-date on cancer screening, with mammogram, endoscopies, negative.  CEA, CA-19-9 normal. -Transitioned to oral Xarelto with instructions given to the patient.  Likely provoked DVT and PE due to sedentary lifestyle, duration of anticoagulation 6 months, however would recommend  repeating CT angiogram of the chest and lower extremity venous Dopplers before discontinuing anticoagulation.   Nausea and vomiting -Likely due to #1, improving, resolved, tolerating solid diet without any difficulty   Hypertension -BP now improving, HCTZ, amlodipine discontinued.   -Patient has significant tachycardia at the time of presentation, continue beta-blocker, Lotensin.  [place]ardi has improved.  Diabetes mellitus type 2, uncontrolled with hyperglycemia -HbA1c 10.5 in 03/11/2018 -Hemoglobin A1c 11.1, patient can restart her outpatient regimen however recommend referral to endocrinology for better control of diabetes mellitus.   Obesity BMI 46, counseled patient for diet and weight control   Day of Discharge S: Feeling a lot better today, at rest sats in 90s off O2 however with exertion, needed O2 2 L.  No fevers or chills, no nausea vomiting, tolerating solid diet.  BP (!) 127/91 (BP Location: Left Arm)   Pulse 85   Temp 98.8 F (37.1 C) (Oral)   Resp 17   Ht 5\' 3"   (1.6 m)   Wt 117.9 kg Comment: pts stated weight. too nausous to weigh at this time  SpO2 94%   BMI 46.06 kg/m   Physical Exam: General: Alert and awake oriented x3 not in any acute distress. HEENT: anicteric sclera, pupils reactive to light and accommodation CVS: S1-S2 clear no murmur rubs or gallops Chest: clear to auscultation bilaterally, no wheezing rales or rhonchi Abdomen: soft nontender, nondistended, normal bowel sounds Extremities: no cyanosis, clubbing or edema noted bilaterally Neuro: Cranial nerves II-XII intact, no focal neurological deficits   The results of significant diagnostics from this hospitalization (including imaging, microbiology, ancillary and laboratory) are listed below for reference.      Procedures/Studies: 2D echo 08/16/2018 IMPRESSIONS    1. The left ventricle has normal systolic function with an ejection fraction of 60-65%. The cavity size was normal. There is mildly increased left ventricular wall thickness. Left ventricular diastolic Doppler parameters are consistent with impaired  relaxation. Indeterminate filling pressures The E/e' is 8-15.  2. The right ventricle has mildly reduced systolic function. The cavity was mildly enlarged. There is no increase in right ventricular wall thickness.  3. The mitral valve is grossly normal.  4. The tricuspid valve is grossly normal. Tricuspid valve regurgitation is mild-moderate.  5. The aortic valve is grossly normal. No stenosis of the aortic valve.  6. The inferior vena cava was dilated in size with <50% respiratory variability.  7. There is left bowing of the interatrial septum, suggestive of elevated right atrial pressure.  SUMMARY   LVEF 60-65%, mild LVH, normal wall motion, grade 1 DD, indeterminate LV filling pressure, mild RVE with mild RV systolic dysfunction, trivial MR, mild to moderate TR, RVSP 56 mmHg (moderate pulmonary hypertension), dilated IVC   Ct Head Wo Contrast  Result Date:  08/15/2018 CLINICAL DATA:  Witnessed syncopal episode today. EXAM: CT HEAD WITHOUT CONTRAST TECHNIQUE: Contiguous axial images were obtained from the base of the skull through the vertex without intravenous contrast. COMPARISON:  None. FINDINGS: Brain: Ventricles, cisterns and other CSF spaces are normal. There is no mass, mass effect, shift of midline structures or acute hemorrhage. No evidence of acute infarction. Vascular: No hyperdense vessel or unexpected calcification. Skull: Normal. Negative for fracture or focal lesion. Sinuses/Orbits: No acute finding. Other: None. IMPRESSION: No acute findings. Electronically Signed   By: Marin Olp M.D.   On: 08/15/2018 15:19   Ct Angio Chest Pe W/cm &/or Wo Cm  Result Date: 08/15/2018 CLINICAL DATA:  Chest pain.  Evaluate for  pulmonary embolism. EXAM: CT ANGIOGRAPHY CHEST WITH CONTRAST TECHNIQUE: Multidetector CT imaging of the chest was performed using the standard protocol during bolus administration of intravenous contrast. Multiplanar CT image reconstructions and MIPs were obtained to evaluate the vascular anatomy. CONTRAST:  26mL OMNIPAQUE IOHEXOL 350 MG/ML SOLN COMPARISON:  12/15/2004; chest radiograph-earlier same day FINDINGS: Vascular Findings: There is adequate opacification of the pulmonary arterial system with the main pulmonary artery measuring 410 Hounsfield units. Mixed occlusive and nonocclusive filling defects are seen within segmental and subsegmental pulmonary arteries bilaterally. The main and majority of the right and left pulmonary arteries remain patent without more central filling defect. Normal caliber the main pulmonary artery. While the overall clot burden is deemed moderate volume, there is bowing of the intraventricular septum with abnormal right to left ventricular ratio of 2.0 as could be seen in the setting right-sided heart strain. No evidence of evolving pulmonary infarction Cardiomegaly.  No pericardial effusion. Normal caliber  the thoracic aorta. Note is made of an aberrant right subclavian artery which courses posterior to the trachea and the esophagus. Review of the MIP images confirms the above findings. ---------------------------------------------------------------------------------- Nonvascular Findings: Mediastinum/Lymph Nodes: Scattered partially calcified mediastinal, prevascular and bilateral hilar lymph nodes compatible with known history of sarcoidosis. No bulky mediastinal, hilar axillary lymphadenopathy. Lungs/Pleura: Ill-defined ground-glass within the left upper lobe (represent image 22, series 7), is favored to represent an area of transient atelectasis given patient respiratory artifact. No discrete focal airspace opacities. No pleural effusion or pneumothorax. The central pulmonary airways appear widely patent. No discrete pulmonary nodules given limitation of the examination. Upper abdomen: Limited early arterial phase evaluation of the upper abdomen demonstrates diffuse decreased attenuation of the hepatic parenchyma with nodularity of the hepatic contour suggestive of cirrhosis. The spleen is noted to be atretic. Musculoskeletal: No acute or aggressive osseous abnormalities. Regional soft tissues appear normal. Normal appearance of the thyroid gland. IMPRESSION: 1. Positive for acute bilateral moderate volume pulmonary embolism with CT evidence of right heart strain (RV/LV Ratio = 2.0) consistent with at least submassive (intermediate risk) PE. The presence of right heart strain has been associated with an increased risk of morbidity and mortality. Please activate Code PE by paging [phone]. 2. Decreased attenuation of the hepatic parenchyma with nodularity hepatic contour compatible with hepatic cirrhosis, potentially progressed compared to the 11/2004 examination. 3. Partially calcified mediastinal and bilateral hilar lymph nodes compatible provided history of sarcoidosis. 4. Incidentally noted aberrant right  subclavian artery. Critical Value/emergent results were called by telephone at the time of interpretation on 08/15/2018 at 4:54 pm to Dr. Ronnald Nian, who verbally acknowledged these results. Electronically Signed   By: Sandi Mariscal M.D.   On: 08/15/2018 16:56   Dg Chest Port 1 View  Result Date: 08/15/2018 CLINICAL DATA:  Syncope EXAM: PORTABLE CHEST 1 VIEW COMPARISON:  02/13/2018 FINDINGS: Mildly low lung volume. Borderline cardiomegaly. No focal consolidation or effusion. No pneumothorax. IMPRESSION: No active disease.  Low lung volumes. Electronically Signed   By: Donavan Foil M.D.   On: 08/15/2018 15:13   Vas [place] Lower Extremity Venous (dvt)  Result Date: 08/16/2018  Lower Venous Study Indications: Pulmonary embolism.  Risk Factors: Confirmed PE. Comparison Study: No prior study on file for comparison. Performing Technologist: Sharion Dove RVS  Examination Guidelines: A complete evaluation includes B-mode imaging, spectral Doppler, color Doppler, and power Doppler as needed of all accessible portions of each vessel. Bilateral testing is considered an integral part of a complete examination. Limited examinations for reoccurring indications  may be performed as noted.  +---------+---------------+---------+-----------+----------+-------+ RIGHT    CompressibilityPhasicitySpontaneityPropertiesSummary +---------+---------------+---------+-----------+----------+-------+ CFV      Full           Yes      Yes                          +---------+---------------+---------+-----------+----------+-------+ SFJ      Full                                                 +---------+---------------+---------+-----------+----------+-------+ FV Prox  Partial                                      Acute   +---------+---------------+---------+-----------+----------+-------+ FV Mid   Partial                                      Acute    +---------+---------------+---------+-----------+----------+-------+ FV DistalPartial                                      Acute   +---------+---------------+---------+-----------+----------+-------+ PFV      Partial                                      Acute   +---------+---------------+---------+-----------+----------+-------+ POP      None           No       No                   Acute   +---------+---------------+---------+-----------+----------+-------+ PTV      None                                         Acute   +---------+---------------+---------+-----------+----------+-------+ PERO     None                                         Acute   +---------+---------------+---------+-----------+----------+-------+   +---------+---------------+---------+-----------+----------+-------+ LEFT     CompressibilityPhasicitySpontaneityPropertiesSummary +---------+---------------+---------+-----------+----------+-------+ CFV      Full           Yes      Yes                          +---------+---------------+---------+-----------+----------+-------+ SFJ      Full                                                 +---------+---------------+---------+-----------+----------+-------+ FV Prox  Full                                                 +---------+---------------+---------+-----------+----------+-------+  FV Mid   Full                                                 +---------+---------------+---------+-----------+----------+-------+ FV DistalNone                                         Acute   +---------+---------------+---------+-----------+----------+-------+ PFV      Full                                                 +---------+---------------+---------+-----------+----------+-------+ POP      None                                         Acute   +---------+---------------+---------+-----------+----------+-------+ PTV      None                                          Acute   +---------+---------------+---------+-----------+----------+-------+ PERO     None                                         Acute   +---------+---------------+---------+-----------+----------+-------+     Summary: Right: Findings consistent with acute deep vein thrombosis involving the right femoral vein, right proximal profunda vein, right popliteal vein, right posterior tibial veins, and right peroneal veins. Left: Findings consistent with acute deep vein thrombosis involving the distal left femoral vein, left popliteal vein, left posterior tibial veins, and left peroneal veins.  *See table(s) above for measurements and observations. Electronically signed by Monica Martinez MD on 08/16/2018 at 10:01:15 AM.    Final        LAB RESULTS: Basic Metabolic Panel: Recent Labs  Lab 08/15/18 1400  08/17/18 1015 08/18/18 0310  NA 140   < > 137 136  K 3.7   < > 3.6 3.3*  CL 105   < > 100 100  CO2 20*   < > 26 28  GLUCOSE 370*   < > 253* 180*  BUN 12   < > 12 9  CREATININE 0.77   < > 0.92 0.95  CALCIUM 9.2   < > 8.9 9.1  MG 1.7  --   --   --   PHOS 3.8  --   --   --    < > = values in this interval not displayed.   Liver Function Tests: Recent Labs  Lab 08/15/18 1400  AST 164*  ALT 122*  ALKPHOS 130*  BILITOT 0.5  PROT 7.6  ALBUMIN 3.5   No results for input(s): LIPASE, AMYLASE in the last 168 hours. No results for input(s): AMMONIA in the last 168 hours. CBC: Recent Labs  Lab 08/15/18 1400  08/17/18 0840 08/18/18 0310  WBC 12.2*   < > 9.3 11.5*  NEUTROABS 7.8*  --   --   --  HGB 12.5   < > 12.2 11.1*  HCT 42.1   < > 40.0 36.1  MCV 75.6*   < > 74.2* 73.5*  PLT 167   < > 124* 163   < > = values in this interval not displayed.   Cardiac Enzymes: Recent Labs  Lab 08/15/18 1400  TROPONINI 0.06*   BNP: Invalid input(s): POCBNP CBG: Recent Labs  Lab 08/18/18 0616 08/18/18 1111  GLUCAP 195* 223*       Disposition and Follow-up: Discharge Instructions    Diet Carb Modified   Complete by: As directed    Discharge instructions   Complete by: As directed    It is VERY IMPORTANT that you follow up with a PCP on a regular basis.  Check your blood glucoses before each meal and at bedtime and maintain a log of your readings.  Bring this log with you when you follow up with your PCP so that he or she can adjust your insulin at your follow up visit.   Please continue starter pack of Xarelto. On Day 22 ( 09/08/18), switch to xarelto 20mg  daily with supper. Continue till advised by your doctor to stop.   Increase activity slowly   Complete by: As directed        DISPOSITION: Home   DISCHARGE FOLLOW-UP Follow-up Information    Henson, [name] L, NP-C. Schedule an appointment as soon as possible for a visit in 2 week(s).   Specialty: Family Medicine Contact information: [place] [address] [phone]        Nahser, Wonda Cheng, MD Follow up.   Specialty: Cardiology Contact information: [address] [id number] [phone]            Time coordinating discharge:  45 minutes  Signed:   Estill Cotta M.D. [facility] 08/18/2018, 11:59 AM

## 2018-08-18 NOTE — Plan of Care (Signed)

## 2018-08-18 NOTE — Progress Notes (Signed)
Handoff report taken at 11am from [place] to continuity of care  Palma Holter, RN

## 2018-08-18 NOTE — Progress Notes (Signed)
[place] for Heparin Indication: pulmonary embolus  Allergies  Allergen Reactions  . Naprosyn [Naproxen] Nausea Only    Patient Measurements: Height: 5\' 3"  (160 cm) Weight: 260 lb (117.9 kg)(pts stated weight. too nausous to weigh at this time) IBW/kg (Calculated) : 52.4 Heparin Dosing Weight: 81.2 kg   Vital Signs: Temp: 98.3 F (36.8 C) (06/23 0326) Temp Source: Oral (06/23 0326) BP: 140/85 (06/23 0326) Pulse Rate: 91 (06/23 0326)  Labs: Recent Labs    08/15/18 1400  08/16/18 1133  08/17/18 0840 08/17/18 1015 08/17/18 1832 08/18/18 0310 08/18/18 0313  HGB 12.5   < > 12.0  --  12.2  --   --  11.1*  --   HCT 42.1   < > 39.8  --  40.0  --   --  36.1  --   PLT 167  --  164  --  124*  --   --  163  --   LABPROT 13.7  --  14.7  --   --   --   --   --   --   INR 1.1  --  1.2  --   --   --   --   --   --   HEPARINUNFRC  --    < > 0.95*   < >  --  0.17* 0.31  --  0.31  CREATININE 0.77  --  0.98  --   --  0.92  --   --   --   TROPONINI 0.06*  --   --   --   --   --   --   --   --    < > = values in this interval not displayed.    Estimated Creatinine Clearance: 85.7 mL/min (by C-G formula based on SCr of 0.92 mg/dL).  Assessment: 53 y.o. female with PE/DVT for heparin  Goal of Therapy:  Heparin level 0.3-0.7 units/ml Monitor platelets by anticoagulation protocol: Yes   Plan:  Continue Heparin at current rate   Phillis Knack, PharmD, BCPS   08/18/2018 3:44 AM

## 2018-08-18 NOTE — Discharge Instructions (Signed)
Information on my medicine - XARELTO (rivaroxaban)  This medication education was reviewed with me or my healthcare representative as part of my discharge preparation.  The pharmacist that spoke with me during my hospital stay was:  Einar Grad, [facility]  WHY WAS Index? Xarelto was prescribed to treat blood clots that may have been found in the veins of your legs (deep vein thrombosis) or in your lungs (pulmonary embolism) and to reduce the risk of them occurring again.  What do you need to know about Xarelto? The starting dose is one 15 mg tablet taken TWICE daily with food for the FIRST 21 DAYS then on (enter date)  09/08/18  the dose is changed to one 20 mg tablet taken ONCE A DAY with your evening meal.  DO NOT stop taking Xarelto without talking to the health care provider who prescribed the medication.  Refill your prescription for 20 mg tablets before you run out.  After discharge, you should have regular check-up appointments with your healthcare provider that is prescribing your Xarelto.  In the future your dose may need to be changed if your kidney function changes by a significant amount.  What do you do if you miss a dose? If you are taking Xarelto TWICE DAILY and you miss a dose, take it as soon as you remember. You may take two 15 mg tablets (total 30 mg) at the same time then resume your regularly scheduled 15 mg twice daily the next day.  If you are taking Xarelto ONCE DAILY and you miss a dose, take it as soon as you remember on the same day then continue your regularly scheduled once daily regimen the next day. Do not take two doses of Xarelto at the same time.   Important Safety Information Xarelto is a blood thinner medicine that can cause bleeding. You should call your healthcare provider right away if you experience any of the following: ? Bleeding from an injury or your nose that does not stop. ? Unusual colored urine (red or dark brown) or  unusual colored stools (red or black). ? Unusual bruising for unknown reasons. ? A serious fall or if you hit your head (even if there is no bleeding).  Some medicines may interact with Xarelto and might increase your risk of bleeding while on Xarelto. To help avoid this, consult your healthcare provider or pharmacist prior to using any new prescription or non-prescription medications, including herbals, vitamins, non-steroidal anti-inflammatory drugs (NSAIDs) and supplements.  This website has more information on Xarelto: [email or web address]

## 2018-08-18 NOTE — TOC Initial Note (Signed)
Transition of Care [facility]) - Initial/Assessment Note    Patient Details  Name: Victoria Padilla MRN: [id number] Date of Birth: 02-Mar-1965  Transition of Care [facility]) CM/SW Contact:    Pollie Friar, RN Phone Number: 08/18/2018, 12:12 PM  Clinical Narrative:                 Pt desat with ambulation on room air and qualifies for home oxygen. TOC met with her and she has no preference for DME company. CM arranged DME with [employer], and they will deliver DME to the room. Pt chose [facility] for [place] services.  Pt has transportation home when DME arrives.  Pt provided 30 day free card for Xarelto.  Expected Discharge Plan: [place] Barriers to Discharge: No Barriers Identified   Patient Goals and CMS Choice   CMS Medicare.gov Compare Post Acute Care list provided to:: Patient Choice offered to / list presented to : Patient  Expected Discharge Plan and Services Expected Discharge Plan: [place]   Discharge Planning Services: CM Consult Post Acute Care Choice: Home Health, Durable Medical Equipment   Expected Discharge Date: 08/18/18               DME Arranged: Oxygen, Walker rolling DME Agency: [employer] Date DME Agency Contacted: 08/18/18 Time DME Agency Contacted: [id number] Representative spoke with at DME Agency: [place]: RN, PT [place] Agency: [place] ([place]) Date [place]: 08/18/18 Time [place]: 20 Representative spoke with at [place]: Butch Penny  Prior Living Arrangements/Services   Lives with:: Self Patient language and need for interpreter reviewed:: Yes(no needs) Do you feel safe going back to the place where you live?: Yes            Criminal Activity/Legal Involvement Pertinent to Current Situation/Hospitalization: No - Comment as needed  Activities of Daily Living Home Assistive Devices/Equipment: None ADL Screening (condition at time of admission) Patient's cognitive ability adequate to  safely complete daily activities?: Yes Is the patient deaf or have difficulty hearing?: No Does the patient have difficulty seeing, even when wearing glasses/contacts?: No Does the patient have difficulty concentrating, remembering, or making decisions?: No Patient able to express need for assistance with ADLs?: Yes Does the patient have difficulty dressing or bathing?: No Independently performs ADLs?: Yes (appropriate for developmental age) Does the patient have difficulty walking or climbing stairs?: No Weakness of Legs: None Weakness of Arms/Hands: None  Permission Sought/Granted                  Emotional Assessment Appearance:: Appears stated age Attitude/Demeanor/Rapport: Engaged Affect (typically observed): Accepting, Pleasant Orientation: : Oriented to Self, Oriented to Place, Oriented to  Time, Oriented to Situation   Psych Involvement: No (comment)  Admission diagnosis:  Acute pulmonary embolism, unspecified pulmonary embolism type, unspecified whether acute cor pulmonale present [facility]) [I26.99] Patient Active Problem List   Diagnosis Date Noted  . Pulmonary embolism ([place]) 08/15/2018  . Weight gain 08/03/2018  . OSA (obstructive sleep apnea) 03/22/2018  . Orthopnea 02/13/2018  . Family history of heart disease in female family member before age 86 02/13/2018  . GERD (gastroesophageal reflux disease) 12/21/2015  . Personal history of noncompliance with medical treatment, presenting hazards to health 12/21/2015  . Morbid obesity ([place]) 03/03/2015  . History of anemia 03/03/2015  . Hyperlipidemia associated with type 2 diabetes mellitus ([place]) 03/03/2015  . Uncontrolled hypertension 03/03/2015  . 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 Uncontrolled type 2 diabetes mellitus with complication ([place])   . Arthritis   . Cervical dysplasia   . Sarcoidosis    PCP:  [name], NP-C Pharmacy:   [id number]Phone: [phone]: [phone] [facility] [place] [address]Phone: [phone]: [phone]    Social Determinants of Health (SDOH) Interventions    Readmission Risk Interventions No flowsheet data found.

## 2018-08-18 NOTE — Evaluation (Signed)
Physical Therapy Evaluation Patient Details Name: Victoria Padilla MRN: [id number] DOB: 16-Dec-1965 Today's Date: 08/18/2018   History of Present Illness  Victoria Padilla  is a 53 y.o. female, past medical history significant for diabetes mellitus type 2, hyperlipidemia, hypertension and obstructive sleep apnea presenting today with an episode at home with altered mental status. CT of chest revealed bilat PEs with noted RV strain. Pt started on heparin drip. Has now switched to [place] pills.  Clinical Impression  Pt admitted with above. Pt oriented x 4. Pt with decreased activity tolerance and endurance due to onset of DOE due to noted bilat PEs that are being treated. Pt was indep and working PTA. Pt to benefit from RW for energy conservation to allow for increased ambulation tolerance and increased distance. With deep breathing and rest pt about to achieve 91% SPO2 on RA however pt maintains SpO2 >90% on 2Lo2 during amb. Pt with good home set up and support. Anticipate pt to progress quickly. Acute PT to cont to follow.  SATURATION QUALIFICATIONS: (This note is used to comply with regulatory documentation for home oxygen)  Patient Saturations on Room Air at Rest = 91%, 97% on 2LO2  Patient Saturations on Room Air while Ambulating = 83%  Patient Saturations on 2 Liters of oxygen while Ambulating = 94%  Please briefly explain why patient needs home oxygen: Pt unable to maintain 88% SpO2 on RA with ambulation    Follow Up Recommendations Home health PT;Supervision/Assistance - 24 hour(to address O2 line management and Spo2 during activity)    Equipment Recommendations  Rolling walker with 5" wheels    Recommendations for Other Services       Precautions / Restrictions Precautions Precautions: Fall Precaution Comments: watch SpO2 on RA Restrictions Weight Bearing Restrictions: No      Mobility  Bed Mobility Overal bed mobility: Modified Independent             General bed  mobility comments: HOB elevated, use of bed rail  Transfers Overall transfer level: Needs assistance Equipment used: None Transfers: Sit to/from Stand Sit to Stand: Supervision         General transfer comment: verbal cues to push up from the bed, no difficulty  Ambulation/Gait Ambulation/Gait assistance: Min guard Gait Distance (Feet): 200 Feet Assistive device: Rolling walker (2 wheeled) Gait Pattern/deviations: Step-through pattern;Decreased stride length Gait velocity: decreased Gait velocity interpretation: <1.31 ft/sec, indicative of household ambulator General Gait Details: verbal cues for deep breaths, SpO2 dec to 83% on RA, able to come back up to 90% with deep breaths and rest  Stairs            Wheelchair Mobility    Modified Rankin (Stroke Patients Only)       Balance Overall balance assessment: Needs assistance Sitting-balance support: Feet supported;No upper extremity supported Sitting balance-Leahy Scale: Good Sitting balance - Comments: pt able to don socks, due to body habitus, SpO2 dec to 83% on RA however suspect that to be holding breath while bending over to don socks   Standing balance support: Bilateral upper extremity supported Standing balance-Leahy Scale: Fair Standing balance comment: pt able to maitain static standing balance without UE support but requires RW for safe amb                             Pertinent Vitals/Pain Pain Assessment: No/denies pain    Home Living Family/patient expects to be discharged to:: Private residence Living  Arrangements: Children(24yo, 4 yo, 1 month granddaughter) Available Help at Discharge: Family;Available 24 hours/day Type of Home: House Home Access: Stairs to enter;Level entry(doesn't have to do stairs)     Home Layout: One level Home Equipment: None Additional Comments: pt was working from home    Prior Function Level of Independence: Independent         Comments: pt was  working from home, no AD and indep for all ADLs and IADLS     Hand Dominance   Dominant Hand: Right    Extremity/Trunk Assessment   Upper Extremity Assessment Upper Extremity Assessment: Overall WFL for tasks assessed    Lower Extremity Assessment Lower Extremity Assessment: Overall WFL for tasks assessed    Cervical / Trunk Assessment Cervical / Trunk Assessment: Normal  Communication   Communication: No difficulties  Cognition Arousal/Alertness: Awake/alert Behavior During Therapy: WFL for tasks assessed/performed Overall Cognitive Status: Within Functional Limits for tasks assessed                                        General Comments General comments (skin integrity, edema, etc.): check SpO2 while walking in beginning of note    Exercises     Assessment/Plan    PT Assessment Patient needs continued PT services  PT Problem List Decreased strength;Decreased range of motion;Decreased activity tolerance;Decreased balance;Decreased mobility;Decreased knowledge of use of DME       PT Treatment Interventions DME instruction;Gait training;Stair training;Functional mobility training;Therapeutic activities;Therapeutic exercise;Balance training    PT Goals (Current goals can be found in the Care Plan section)  Acute Rehab PT Goals Patient Stated Goal: improve breathing PT Goal Formulation: With patient Time For Goal Achievement: 09/01/18 Potential to Achieve Goals: Good    Frequency Min 3X/week   Barriers to discharge        Co-evaluation               AM-PAC PT "6 Clicks" Mobility  Outcome Measure Help needed turning from your back to your side while in a flat bed without using bedrails?: None Help needed moving from lying on your back to sitting on the side of a flat bed without using bedrails?: None Help needed moving to and from a bed to a chair (including a wheelchair)?: A Little Help needed standing up from a chair using your arms  (e.g., wheelchair or bedside chair)?: A Little Help needed to walk in hospital room?: A Little Help needed climbing 3-5 steps with a railing? : A Little 6 Click Score: 20    End of Session Equipment Utilized During Treatment: Oxygen(2Lo2 via [place]) Activity Tolerance: Patient tolerated treatment well Patient left: in chair;with call bell/phone within reach Nurse Communication: Mobility status PT Visit Diagnosis: Difficulty in walking, not elsewhere classified (R26.2)    Time: 6568-1275 PT Time Calculation (min) (ACUTE ONLY): 28 min   Charges:   PT Evaluation $PT Eval Moderate Complexity: 1 Mod PT Treatments $Gait Training: 8-22 mins        Kittie Plater, PT, DPT Acute Rehabilitation Services Pager #: [phone] Office #: [phone]   Berline Lopes 08/18/2018, 9:55 AM

## 2018-08-19 LAB — LUPUS ANTICOAGULANT PANEL
DRVVT: 42.7 s (ref 0.0–47.0)
PTT Lupus Anticoagulant: 48.7 s (ref 0.0–51.9)

## 2018-08-19 LAB — PROTEIN C ACTIVITY: Protein C Activity: 122 % (ref 73–180)

## 2018-08-19 LAB — PROTEIN S, TOTAL: Protein S Ag, Total: 148 % (ref 60–150)

## 2018-08-19 LAB — PROTEIN S ACTIVITY: Protein S Activity: 84 % (ref 63–140)

## 2018-08-20 LAB — CULTURE, BLOOD (ROUTINE X 2)
Culture: NO GROWTH
Culture: NO GROWTH
Special Requests: ADEQUATE
Special Requests: ADEQUATE

## 2018-08-23 ENCOUNTER — Other Ambulatory Visit: Payer: Self-pay | Admitting: Endocrinology

## 2018-08-23 ENCOUNTER — Other Ambulatory Visit: Payer: Self-pay | Admitting: Family Medicine

## 2018-08-24 ENCOUNTER — Telehealth: Payer: Self-pay | Admitting: Internal Medicine

## 2018-08-24 LAB — FACTOR 5 LEIDEN

## 2018-08-24 LAB — PROTHROMBIN GENE MUTATION

## 2018-08-24 NOTE — Telephone Encounter (Signed)
Pt called and states she is concerned because of swelling and bp is elevated. Recent hospital admission due to pulmonary clots in both lungs and both legs. On xarelto twice a day. On 2 new bp meds. bp has been around 179/95.   Per JCL not at a great risk and can wait to come in later this week.

## 2018-08-24 NOTE — Telephone Encounter (Signed)
Pt sees endocrinology

## 2018-08-26 ENCOUNTER — Other Ambulatory Visit: Payer: Self-pay

## 2018-08-26 ENCOUNTER — Encounter: Payer: Self-pay | Admitting: Family Medicine

## 2018-08-26 ENCOUNTER — Telehealth: Payer: Self-pay | Admitting: Cardiovascular Disease

## 2018-08-26 ENCOUNTER — Ambulatory Visit (INDEPENDENT_AMBULATORY_CARE_PROVIDER_SITE_OTHER): Payer: 59 | Admitting: Family Medicine

## 2018-08-26 VITALS — BP 180/100 | HR 68 | Temp 99.0°F | Resp 16 | Wt 259.8 lb

## 2018-08-26 DIAGNOSIS — E1169 Type 2 diabetes mellitus with other specified complication: Secondary | ICD-10-CM

## 2018-08-26 DIAGNOSIS — I2609 Other pulmonary embolism with acute cor pulmonale: Secondary | ICD-10-CM

## 2018-08-26 DIAGNOSIS — E1165 Type 2 diabetes mellitus with hyperglycemia: Secondary | ICD-10-CM

## 2018-08-26 DIAGNOSIS — E1139 Type 2 diabetes mellitus with other diabetic ophthalmic complication: Secondary | ICD-10-CM

## 2018-08-26 DIAGNOSIS — Z09 Encounter for follow-up examination after completed treatment for conditions other than malignant neoplasm: Secondary | ICD-10-CM

## 2018-08-26 DIAGNOSIS — K219 Gastro-esophageal reflux disease without esophagitis: Secondary | ICD-10-CM | POA: Diagnosis not present

## 2018-08-26 DIAGNOSIS — IMO0002 Reserved for concepts with insufficient information to code with codable children: Secondary | ICD-10-CM

## 2018-08-26 DIAGNOSIS — G4733 Obstructive sleep apnea (adult) (pediatric): Secondary | ICD-10-CM | POA: Diagnosis not present

## 2018-08-26 DIAGNOSIS — I10 Essential (primary) hypertension: Secondary | ICD-10-CM

## 2018-08-26 DIAGNOSIS — I82403 Acute embolism and thrombosis of unspecified deep veins of lower extremity, bilateral: Secondary | ICD-10-CM

## 2018-08-26 DIAGNOSIS — D869 Sarcoidosis, unspecified: Secondary | ICD-10-CM

## 2018-08-26 DIAGNOSIS — E785 Hyperlipidemia, unspecified: Secondary | ICD-10-CM

## 2018-08-26 MED ORDER — AMLODIPINE BESYLATE 5 MG PO TABS: 5.0000 mg | ORAL_TABLET | Freq: Every day | ORAL | 2 refills | Status: DC

## 2018-08-26 MED ORDER — OMEPRAZOLE 40 MG PO CPDR: 40.0000 mg | DELAYED_RELEASE_CAPSULE | Freq: Every day | ORAL | 3 refills | Status: DC

## 2018-08-26 MED ORDER — HYDROCHLOROTHIAZIDE 12.5 MG PO CAPS: 12.5000 mg | ORAL_CAPSULE | Freq: Every day | ORAL | 1 refills | Status: DC

## 2018-08-26 NOTE — Telephone Encounter (Signed)
New Message     Left Message to confirm appt and to get consent

## 2018-08-26 NOTE — Progress Notes (Signed)
Subjective:    Patient ID: Victoria Padilla, female    DOB: [birth year], 53 y.o.   MRN: [id number]  HPI Chief Complaint  Patient presents with  . hospital    hospital follow-up on swelling and bp   She is a 53 year old female with history of DM type 2 uncontrollled, HTN, HL, mild OSA, sarcoidosis who is here for a hospital follow up. She was hospitalized from 08/15/18 - 08/18/18 for acute bilateral PEs and DVTs. Unknown etiology. Negative hypercoagulable workup.  She is on Xarelto and reports doing fine with it. No bleeding.   She was discharged home on oxygen 2L [place]. States she forgot to bring it with her today. She does not appear to be short of breath and sp02 is 95%.  Has PT at home 2 times weekly.   HTN uncontrolled since being discharged home. She has been taking benazepril 40 mg and metoprolol 25 mg bid. Apparently HCTZ and amlodipine was discontinued upon hospital discharge and it is unclear as to why this occurred.  She will follow up with her cardiologist as recommended.   Denies fever, chills, dizziness, chest pain, palpitations, N/V/D.   She does have a dry cough and DOE. States she is not able to lie flat.   Complains of LE edema. Pain in LE with walking. She is taking Tylenol for this.   Diabetes with Hgb A1c 11.1% in hospital. Managed by Dr. Loanne Drilling.   OSA- states she does not have a CPAP. Dr. Loanne Drilling ordered the sleep study. She has an appointment for her diabetes next week with him and will follow up on whether she should pursue CPAP.   Epigastric pain for over a year. Pain is not as often. Nausea is not present. Pain seems to be worse with walking.  She is taking omeprazole as needed but states she needs a refill.   Past Medical History:  Diagnosis Date  . Acid reflux   . Anemia   . Arthritis   . Cervical dysplasia   . Diabetes mellitus   . Heart murmur   . Hyperlipidemia   . Hypertension   . LBP (low back pain)   . Obesity   . OSA (obstructive sleep apnea)  03/22/2018   New diagnosis. Mild.   . Sarcoidosis   . Vitamin D deficiency    Past Surgical History:  Procedure Laterality Date  . BUNIONECTOMY    . CHOLECYSTECTOMY    . COLPOSCOPY    . GYNECOLOGIC CRYOSURGERY    . KNEE ARTHROSCOPY    . TUBAL LIGATION     Current Outpatient Medications on File Prior to Visit  Medication Sig Dispense Refill  . albuterol (VENTOLIN HFA) 108 (90 Base) MCG/ACT inhaler Inhale 2 puffs into the lungs every 6 (six) hours as needed for wheezing or shortness of breath. 18 g 4  . B-D ULTRAFINE III SHORT PEN 31G X 8 MM MISC USE TO INJECT BASAGLAR DAILY 100 each 11  . benazepril (LOTENSIN) 40 MG tablet TAKE 1 TABLET BY MOUTH EVERY DAY (Patient taking differently: Take 40 mg by mouth at bedtime. ) 90 tablet 0  . benzonatate (TESSALON) 100 MG capsule Take 1 capsule (100 mg total) by mouth 3 (three) times daily as needed for cough. 30 capsule 0  . cetirizine (ZYRTEC) 10 MG tablet Take 1 tablet (10 mg total) by mouth daily. (Patient taking differently: Take 10 mg by mouth at bedtime as needed (seasonal allergies). ) 15 tablet 0  . cholecalciferol (  VITAMIN D) 1000 units tablet Take 2,000 Units by mouth at bedtime.     . dapagliflozin propanediol (FARXIGA) 5 MG TABS tablet Take 5 mg by mouth daily. (Patient taking differently: Take 5 mg by mouth at bedtime. ) 30 tablet 11  . fluticasone (FLONASE) 50 MCG/ACT nasal spray Place 2 sprays into both nostrils daily as needed (seasonal allergies).     Marland Kitchen glucose blood (ONETOUCH VERIO) test strip 1 each by Other route 2 (two) times daily. And lancets 2/day 200 each 3  . Insulin Glargine (BASAGLAR KWIKPEN) 100 UNIT/ML SOPN Inject 0.35 mLs (35 Units total) into the skin every morning. And pen needles 1/day 10 pen 3  . ipratropium (ATROVENT) 0.06 % nasal spray Place 2 sprays into both nostrils 4 (four) times daily. 15 mL 0  . metFORMIN (GLUCOPHAGE-XR) 500 MG 24 hr tablet TAKE 4 TABLETS (2,000 MG TOTAL) BY MOUTH DAILY WITH BREAKFAST.  (Patient taking differently: Take 2,000 mg by mouth at bedtime. ) 120 tablet 0  . metoprolol tartrate (LOPRESSOR) 25 MG tablet Take 1 tablet (25 mg total) by mouth 2 (two) times daily. 60 tablet 4  . Multiple Vitamin (MULTIVITAMIN WITH MINERALS) TABS tablet Take 1 tablet by mouth at bedtime.    . Olopatadine HCl 0.2 % SOLN Apply 1 drop to eye daily. (Patient taking differently: Place 1 drop into both eyes daily as needed (seasonal allergies). ) 2.5 mL 0  . Omega-3 Fatty Acids (FISH OIL PO) Take 1 capsule by mouth at bedtime. Reported on 08/08/2015    . ondansetron (ZOFRAN ODT) 4 MG disintegrating tablet Take 1 tablet (4 mg total) by mouth every 8 (eight) hours as needed for nausea or vomiting. 20 tablet 0  . ONETOUCH DELICA LANCETS 29J MISC Test twice a day. Pt uses a one touch verio flex meter 100 each 5  . [START ON 09/08/2018] rivaroxaban (XARELTO) 20 MG TABS tablet Take 1 tablet (20 mg total) by mouth daily with supper. 30 tablet 4  . simvastatin (ZOCOR) 20 MG tablet TAKE 1 TABLET BY MOUTH EVERYDAY AT BEDTIME (Patient taking differently: Take 20 mg by mouth at bedtime. ) 90 tablet 1   No current facility-administered medications on file prior to visit.     Reviewed allergies, medications, past medical, surgical, family, and social history.   Review of Systems Pertinent positives and negatives in the history of present illness.     Objective:   Physical Exam BP (!) 180/100   Pulse 68   Temp 99 F (37.2 C)   Resp 16   Wt 259 lb 12.8 oz (117.8 kg)   SpO2 96%   BMI 46.02 kg/m   Alert and in no distress. Cardiac exam shows a regular rhythm without murmurs or gallops. Lungs are clear to auscultation. She does have a dry cough. LE with nonpitting edema. Distal pulses intact.        Assessment & Plan:  Hospital discharge follow-up - Plan: reviewed all summaries, labs, imaging results and reconciled medications.   Other acute pulmonary embolism with acute cor pulmonale ([place]) - Plan:  continue Xarelto. Consider referral to hematology before discontinuing Xarelto. Continue on oxygen at home.   Gastroesophageal reflux disease, esophagitis presence not specified - Plan: omeprazole (PRILOSEC) 40 MG capsule, continue PPI as needed. Follow up with Dr. Havery Moros if epigastric pain not improving.   OSA (obstructive sleep apnea) - Plan: discussed sleeping off her back and elevating her head. Avoid sedatives. Follow up with Dr. Loanne Drilling next week since  he ordered sleep study. She may want to consider trying CPAP.   Sarcoidosis   Hyperlipidemia associated with type 2 diabetes mellitus ([place]) - Plan: continue statin.   Uncontrolled hypertension - Plan: CBC with Differential/Platelet, Comprehensive metabolic panel, amLODipine (NORVASC) 5 MG tablet, hydrochlorothiazide (MICROZIDE) 12.5 MG capsule, continue benazepril, metoprolol and will start her back on amlodipine and HCTZ, unclear as to why this was discontinued. Discussed low sodium diet. Follow up with Dr. Acie Fredrickson Monday as scheduled.   Morbid obesity ([place]) - Plan: recommend weight loss by eating smaller portions and healthy diet.   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 Loanne Drilling. She will see him next week.   Acute deep vein thrombosis (DVT) of both lower extremities, unspecified vein ([facility]) - Plan: continue Xarelto. Tylenol for pain. Avoid NSAIDs.

## 2018-08-26 NOTE — Patient Instructions (Addendum)
You have a virtual appointment next Monday the 6th at 8:30 am with Dr. Cathie Olden regarding your blood pressure.   Discuss with Dr. Loanne Drilling, next week, the sleep study results and if you would like to pursue a CPAP machine now that you have this new diagnosis of PE and using oxygen at home.   Take the omeprazole daily and see if this improves stomach pain. If not, follow up with Dr. Havery Moros    DASH Eating Plan DASH stands for "Dietary Approaches to Stop Hypertension." The DASH eating plan is a healthy eating plan that has been shown to reduce high blood pressure (hypertension). It may also reduce your risk for type 2 diabetes, heart disease, and stroke. The DASH eating plan may also help with weight loss. What are tips for following this plan?  General guidelines  Avoid eating more than 2,300 mg (milligrams) of salt (sodium) a day. If you have hypertension, you may need to reduce your sodium intake to 1,500 mg a day.  Limit alcohol intake to no more than 1 drink a day for nonpregnant women and 2 drinks a day for men. One drink equals 12 oz of beer, 5 oz of wine, or 1 oz of hard liquor.  Work with your health care provider to maintain a healthy body weight or to lose weight. Ask what an ideal weight is for you.  Get at least 30 minutes of exercise that causes your heart to beat faster (aerobic exercise) most days of the week. Activities may include walking, swimming, or biking.  Work with your health care provider or diet and nutrition specialist (dietitian) to adjust your eating plan to your individual calorie needs. Reading food labels   Check food labels for the amount of sodium per serving. Choose foods with less than 5 percent of the Daily Value of sodium. Generally, foods with less than 300 mg of sodium per serving fit into this eating plan.  To find whole grains, look for the word "whole" as the first word in the ingredient list. Shopping  Buy products labeled as "low-sodium" or  "no salt added."  Buy fresh foods. Avoid canned foods and premade or frozen meals. Cooking  Avoid adding salt when cooking. Use salt-free seasonings or herbs instead of table salt or sea salt. Check with your health care provider or pharmacist before using salt substitutes.  Do not fry foods. Cook foods using healthy methods such as baking, boiling, grilling, and broiling instead.  Cook with heart-healthy oils, such as olive, canola, soybean, or sunflower oil. Meal planning  Eat a balanced diet that includes: ? 5 or more servings of fruits and vegetables each day. At each meal, try to fill half of your plate with fruits and vegetables. ? Up to 6-8 servings of whole grains each day. ? Less than 6 oz of lean meat, poultry, or fish each day. A 3-oz serving of meat is about the same size as a deck of cards. One egg equals 1 oz. ? 2 servings of low-fat dairy each day. ? A serving of nuts, seeds, or beans 5 times each week. ? Heart-healthy fats. Healthy fats called Omega-3 fatty acids are found in foods such as flaxseeds and coldwater fish, like sardines, salmon, and mackerel.  Limit how much you eat of the following: ? Canned or prepackaged foods. ? Food that is high in trans fat, such as fried foods. ? Food that is high in saturated fat, such as fatty meat. ? Sweets, desserts, sugary drinks,  and other foods with added sugar. ? Full-fat dairy products.  Do not salt foods before eating.  Try to eat at least 2 vegetarian meals each week.  Eat more home-cooked food and less restaurant, buffet, and fast food.  When eating at a restaurant, ask that your food be prepared with less salt or no salt, if possible. What foods are recommended? The items listed may not be a complete list. Talk with your dietitian about what dietary choices are best for you. Grains Whole-grain or whole-wheat bread. Whole-grain or whole-wheat pasta. Brown rice. Modena Morrow. Bulgur. Whole-grain and low-sodium  cereals. Pita bread. Low-fat, low-sodium crackers. Whole-wheat flour tortillas. Vegetables Fresh or frozen vegetables (raw, steamed, roasted, or grilled). Low-sodium or reduced-sodium tomato and vegetable juice. Low-sodium or reduced-sodium tomato sauce and tomato paste. Low-sodium or reduced-sodium canned vegetables. Fruits All fresh, dried, or frozen fruit. Canned fruit in natural juice (without added sugar). Meat and other protein foods Skinless chicken or [place]. Ground chicken or [place]. Pork with fat trimmed off. Fish and seafood. Egg whites. Dried beans, peas, or lentils. Unsalted nuts, nut butters, and seeds. Unsalted canned beans. Lean cuts of beef with fat trimmed off. Low-sodium, lean deli meat. Dairy Low-fat (1%) or fat-free (skim) milk. Fat-free, low-fat, or reduced-fat cheeses. Nonfat, low-sodium ricotta or cottage cheese. Low-fat or nonfat yogurt. Low-fat, low-sodium cheese. Fats and oils Soft margarine without trans fats. Vegetable oil. Low-fat, reduced-fat, or light mayonnaise and salad dressings (reduced-sodium). Canola, safflower, olive, soybean, and sunflower oils. Avocado. Seasoning and other foods Herbs. Spices. Seasoning mixes without salt. Unsalted popcorn and pretzels. Fat-free sweets. What foods are not recommended? The items listed may not be a complete list. Talk with your dietitian about what dietary choices are best for you. Grains Baked goods made with fat, such as croissants, muffins, or some breads. Dry pasta or rice meal packs. Vegetables Creamed or fried vegetables. Vegetables in a cheese sauce. Regular canned vegetables (not low-sodium or reduced-sodium). Regular canned tomato sauce and paste (not low-sodium or reduced-sodium). Regular tomato and vegetable juice (not low-sodium or reduced-sodium). Angie Fava. Olives. Fruits Canned fruit in a light or heavy syrup. Fried fruit. Fruit in cream or butter sauce. Meat and other protein foods Fatty cuts of meat. Ribs.  Fried meat. Berniece Salines. Sausage. Bologna and other processed lunch meats. Salami. Fatback. Hotdogs. Bratwurst. Salted nuts and seeds. Canned beans with added salt. Canned or smoked fish. Whole eggs or egg yolks. Chicken or [place] with skin. Dairy Whole or 2% milk, cream, and half-and-half. Whole or full-fat cream cheese. Whole-fat or sweetened yogurt. Full-fat cheese. Nondairy creamers. Whipped toppings. Processed cheese and cheese spreads. Fats and oils Butter. Stick margarine. Lard. Shortening. Ghee. Bacon fat. Tropical oils, such as coconut, palm kernel, or palm oil. Seasoning and other foods Salted popcorn and pretzels. Onion salt, garlic salt, seasoned salt, table salt, and sea salt. Worcestershire sauce. Tartar sauce. Barbecue sauce. Teriyaki sauce. Soy sauce, including reduced-sodium. Steak sauce. Canned and packaged gravies. Fish sauce. Oyster sauce. Cocktail sauce. Horseradish that you find on the shelf. Ketchup. Mustard. Meat flavorings and tenderizers. Bouillon cubes. Hot sauce and Tabasco sauce. Premade or packaged marinades. Premade or packaged taco seasonings. Relishes. Regular salad dressings. Where to find more information:  National Heart, Lung, and [place]: https://wilson-eaton.com/  American Heart Association: www.heart.org Summary  The DASH eating plan is a healthy eating plan that has been shown to reduce high blood pressure (hypertension). It may also reduce your risk for type 2 diabetes, heart disease, and stroke.  With the DASH eating plan, you should limit salt (sodium) intake to 2,300 mg a day. If you have hypertension, you may need to reduce your sodium intake to 1,500 mg a day.  When on the DASH eating plan, aim to eat more fresh fruits and vegetables, whole grains, lean proteins, low-fat dairy, and heart-healthy fats.  Work with your health care provider or diet and nutrition specialist (dietitian) to adjust your eating plan to your individual calorie needs. This  information is not intended to replace advice given to you by your health care provider. Make sure you discuss any questions you have with your health care provider. Document Released: 01/31/2011 Document Revised: 01/24/2017 Document Reviewed: 02/05/2016 Elsevier Patient Education  2020 [employer].

## 2018-08-27 LAB — CBC WITH DIFFERENTIAL/PLATELET
Basophils Absolute: 0.1 x10E3/uL (ref 0.0–0.2)
Basos: 1 %
EOS (ABSOLUTE): 0.1 x10E3/uL (ref 0.0–0.4)
Eos: 1 %
Hematocrit: 39.2 % (ref 34.0–46.6)
Hemoglobin: 11.9 g/dL (ref 11.1–15.9)
Immature Grans (Abs): 0 x10E3/uL (ref 0.0–0.1)
Immature Granulocytes: 0 %
Lymphocytes Absolute: 2.6 x10E3/uL (ref 0.7–3.1)
Lymphs: 37 %
MCH: 22.1 pg — ABNORMAL LOW (ref 26.6–33.0)
MCHC: 30.4 g/dL — ABNORMAL LOW (ref 31.5–35.7)
MCV: 73 fL — ABNORMAL LOW (ref 79–97)
Monocytes Absolute: 0.5 x10E3/uL (ref 0.1–0.9)
Monocytes: 8 %
Neutrophils Absolute: 3.6 x10E3/uL (ref 1.4–7.0)
Neutrophils: 53 %
Platelets: 327 x10E3/uL (ref 150–450)
RBC: 5.39 x10E6/uL — ABNORMAL HIGH (ref 3.77–5.28)
RDW: 15.8 % — ABNORMAL HIGH (ref 11.7–15.4)
WBC: 6.9 x10E3/uL (ref 3.4–10.8)

## 2018-08-27 LAB — COMPREHENSIVE METABOLIC PANEL WITH GFR
ALT: 82 IU/L — ABNORMAL HIGH (ref 0–32)
AST: 48 IU/L — ABNORMAL HIGH (ref 0–40)
Albumin/Globulin Ratio: 1.2 (ref 1.2–2.2)
Albumin: 3.9 g/dL (ref 3.8–4.9)
Alkaline Phosphatase: 201 IU/L — ABNORMAL HIGH (ref 39–117)
BUN/Creatinine Ratio: 13 (ref 9–23)
BUN: 9 mg/dL (ref 6–24)
Bilirubin Total: <0.2 mg/dL (ref 0.0–1.2)
CO2: 23 mmol/L (ref 20–29)
Calcium: 9.5 mg/dL (ref 8.7–10.2)
Chloride: 100 mmol/L (ref 96–106)
Creatinine, Ser: 0.69 mg/dL (ref 0.57–1.00)
GFR calc Af Amer: 113 mL/min/1.73 (ref >59)
GFR calc non Af Amer: 98 mL/min/1.73 (ref >59)
Globulin, Total: 3.2 g/dL (ref 1.5–4.5)
Glucose: 179 mg/dL — ABNORMAL HIGH (ref 65–99)
Potassium: 4.9 mmol/L (ref 3.5–5.2)
Sodium: 136 mmol/L (ref 134–144)
Total Protein: 7.1 g/dL (ref 6.0–8.5)

## 2018-08-31 ENCOUNTER — Telehealth (INDEPENDENT_AMBULATORY_CARE_PROVIDER_SITE_OTHER): Payer: 59 | Admitting: Cardiovascular Disease

## 2018-08-31 ENCOUNTER — Other Ambulatory Visit: Payer: Self-pay

## 2018-08-31 ENCOUNTER — Encounter: Payer: Self-pay | Admitting: Cardiovascular Disease

## 2018-08-31 VITALS — BP 168/93 | HR 72 | Ht 63.0 in | Wt 259.0 lb

## 2018-08-31 DIAGNOSIS — I2609 Other pulmonary embolism with acute cor pulmonale: Secondary | ICD-10-CM

## 2018-08-31 DIAGNOSIS — I82411 Acute embolism and thrombosis of right femoral vein: Secondary | ICD-10-CM | POA: Diagnosis not present

## 2018-08-31 DIAGNOSIS — I10 Essential (primary) hypertension: Secondary | ICD-10-CM | POA: Diagnosis not present

## 2018-08-31 DIAGNOSIS — I82413 Acute embolism and thrombosis of femoral vein, bilateral: Secondary | ICD-10-CM

## 2018-08-31 MED ORDER — HYDROCHLOROTHIAZIDE 25 MG PO TABS: 25.0000 mg | ORAL_TABLET | Freq: Every day | ORAL | 3 refills | Status: DC

## 2018-08-31 NOTE — Patient Instructions (Addendum)
Medication Instructions:  Your physician has recommended you make the following change in your medication:  INCREASE HCTZ (Hydrochlorothiazide) to 25 mg daily CONTINUE Xarelto for 6 months   If you need a refill on your cardiac medications before your next appointment, please call your pharmacy.    Lab work: Your physician recommends that you return for lab work on Tuesday July 21. You may come in to our office anytime between 7:30 am and 4:30 pm for your lab work. You do not have to fast for this appointment.  If you have labs (blood work) drawn today and your tests are completely normal, you will receive your results only by: Marland Kitchen MyChart Message (if you have MyChart) OR . A paper copy in the mail If you have any lab test that is abnormal or we need to change your treatment, we will call you to review the results.   Testing/Procedures: None Ordered    Follow-Up: You have been referred to Hematology for evaluation of clotting disorders   At [facility], you and your health needs are our priority.  As part of our continuing mission to provide you with exceptional heart care, we have created designated Provider Care Teams.  These Care Teams include your primary Cardiologist (physician) and Advanced Practice Providers (APPs -  Physician Assistants and Nurse Practitioners) who all work together to provide you with the care you need, when you need it. You will need a follow up appointment in:  6 months.  Please call our office 2 months in advance to schedule this appointment.  You may see Mertie Moores, MD or one of the following Advanced Practice Providers on your designated Care Team: Richardson Dopp, PA-C [place], [place] . Daune Perch, NP

## 2018-08-31 NOTE — Progress Notes (Signed)
Virtual Visit via Telephone Note   This visit type was conducted due to national recommendations for restrictions regarding the COVID-19 Pandemic (e.g. social distancing) in an effort to limit this patient's exposure and mitigate transmission in our community.  Due to her co-morbid illnesses, this patient is at least at moderate risk for complications without adequate follow up.  This format is felt to be most appropriate for this patient at this time.  The patient did not have access to video technology/had technical difficulties with video requiring transitioning to audio format only (telephone).  All issues noted in this document were discussed and addressed.  No physical exam could be performed with this format.  Please refer to the patient's chart for her  consent to telehealth for [facility].   Date:  08/31/2018   ID:  Victoria Padilla, DOB August 10, 1965, MRN [id number]  Patient Location: Home Provider Location: Home  PCP:  [name], NP-C  Cardiologist:  Mertie Moores, MD  Electrophysiologist:  None    Feb. 10, 2020    Victoria Padilla is a 53 y.o. female with a hx of hypertension, hyperlipidemia.  We are asked to see her today by [name], NP-C for further evaluation of some chronic chest pain.  Has had chest soreness , off and on for years.   Seems to have worsened several weeks ago Does not exercise Works on computers all day  - no activity at work .  Not pleuretic, not related to eating or drinking. Not positional  May last 30 or more minutes.  Can last for 1 day   Pains are not related to walking into grocery store  Not related to climbing stairs.   Not related to shortness or breath , syncope .  No diaphoresis   Avoids salt  ,  Avoids processed foods.    Evaluation Performed:  Follow-Up Visit  Chief Complaint:  Chest pain   August 31, 2018 :    Victoria Padilla is a 53 y.o. female with a history of chest pain.  I saw her back in February.   She was hospitalized in June with an acute bilateral pulmonary embolism.   She had been having swelling in her feet.   Developed a sudden pressure in her chest.   Presented to [facility] .  Was found  She had documented DVT.  She was started on Xarelto 15 mg BID.  Will reduce to 20 mg a day on September 16, 2018  Breathing is better. Marland Kitchen Has been walking .   Also doing some leg exercises   Echocardiogram reveals normal left ventricular systolic function.  BP is still elevated.   Has been cutting back on salt.  Still eating bologna once a week .      The patient does not have symptoms concerning for COVID-19 infection (fever, chills, cough, or new shortness of breath).    Past Medical History:  Diagnosis Date  . Acid reflux   . Anemia   . Arthritis   . Cervical dysplasia   . Diabetes mellitus   . Heart murmur   . Hyperlipidemia   . Hypertension   . LBP (low back pain)   . Obesity   . OSA (obstructive sleep apnea) 03/22/2018   New diagnosis. Mild.   . Sarcoidosis   . Vitamin D deficiency    Past Surgical History:  Procedure Laterality Date  . BUNIONECTOMY    . CHOLECYSTECTOMY    . COLPOSCOPY    .  GYNECOLOGIC CRYOSURGERY    . KNEE ARTHROSCOPY    . TUBAL LIGATION       No outpatient medications have been marked as taking for the 08/31/18 encounter (Appointment) with Nahser, Wonda Cheng, MD.     Allergies:   Naprosyn [naproxen]   Social History   Tobacco Use  . Smoking status: Never Smoker  . Smokeless tobacco: Never Used  Substance Use Topics  . Alcohol use: No  . Drug use: No     Family Hx: The patient's family history includes Cancer (age of onset: 36) in her brother; Colon cancer in her maternal grandfather; Colon polyps in her brother; Diabetes in her brother and brother; Heart disease in her brother; Heart failure in her father; Heart failure (age of onset: 55) in her mother; Hyperlipidemia in her brother and brother; Hypertension in her brother and maternal  uncle. There is no history of Stomach cancer, Esophageal cancer, Rectal cancer, or Liver cancer.  ROS:   Please see the history of present illness.     All other systems reviewed and are negative.   Prior CV studies:   The following studies were reviewed today:    Labs/Other Tests and Data Reviewed:    EKG:  No ECG reviewed.  Recent Labs: 08/15/2018: B Natriuretic Peptide 9.1; Magnesium 1.7; TSH 0.955 08/26/2018: ALT 82; BUN 9; Creatinine, Ser 0.69; Hemoglobin 11.9; Platelets 327; Potassium 4.9; Sodium 136   Recent Lipid Panel Lab Results  Component Value Date/Time   CHOL 149 05/08/2017 09:09 AM   TRIG 91 05/08/2017 09:09 AM   HDL 50 05/08/2017 09:09 AM   CHOLHDL 3.0 05/08/2017 09:09 AM   CHOLHDL 3.5 12/04/2016 08:31 AM   LDLCALC 81 05/08/2017 09:09 AM   LDLCALC 121 (H) 12/04/2016 08:31 AM    Wt Readings from Last 3 Encounters:  08/26/18 259 lb 12.8 oz (117.8 kg)  08/15/18 260 lb (117.9 kg)  04/06/18 257 lb 6.4 oz (116.8 kg)     Objective:    Vital Signs:  There were no vitals taken for this visit.     ASSESSMENT & PLAN:    1. Pulmonary Emboli:   She has had some hypercoagulability work-up in the hospital.  I will contact her primary medical doctor and asked her to review these and consider whether or not she still needs a hematology referral.    The pulmonary embolus and DVT sounds like it was non-provoked.  She does have a history of obesity and is fairly inactive but in my opinion, I think that we may need to consider her for lifelong anticoagulation since we do not have a defined event that caused the DVT.  I have encouraged her to ambulate on a regular basis.  I will see her again in 6 months.  I have advised her to take her Xarelto at least until then and we can discuss again at that time.  2.  Essential HTN:   Still eating salty meat such as bologna once or twice a week.  We will increase the hydrochlorothiazide to 25 mg a day.  We will check a basic metabolic  profile on July 21.    I will see her again in 6 months for follow-up visit.  COVID-19 Education: The signs and symptoms of COVID-19 were discussed with the patient and how to seek care for testing (follow up with PCP or arrange E-visit).  The importance of social distancing was discussed today.  Time:   Today, I have spent  21  minutes with the patient with telehealth technology discussing the above problems.     Medication Adjustments/Labs and Tests Ordered: Current medicines are reviewed at length with the patient today.  Concerns regarding medicines are outlined above.   Tests Ordered: No orders of the defined types were placed in this encounter.   Medication Changes: No orders of the defined types were placed in this encounter.   Follow Up:  Virtual Visit or In Person in 6 month(s)  Signed, Mertie Moores, MD  08/31/2018 7:48 AM    [place]

## 2018-09-01 LAB — HEPATITIS PANEL, ACUTE
Hep A IgM: NEGATIVE
Hep B C IgM: NEGATIVE
Hep C Virus Ab: 0.1 s/co ratio (ref 0.0–0.9)
Hepatitis B Surface Ag: NEGATIVE

## 2018-09-01 LAB — ALKALINE PHOSPHATASE, ISOENZYMES
Alkaline Phosphatase: 191 IU/L — ABNORMAL HIGH (ref 39–117)
BONE FRACTION: 38 % (ref 14–68)
INTESTINAL FRAC.: 3 % (ref 0–18)
LIVER FRACTION: 59 % (ref 18–85)

## 2018-09-01 LAB — SPECIMEN STATUS REPORT

## 2018-09-02 ENCOUNTER — Inpatient Hospital Stay: Payer: 59 | Admitting: Family Medicine

## 2018-09-02 ENCOUNTER — Other Ambulatory Visit: Payer: Self-pay

## 2018-09-02 ENCOUNTER — Ambulatory Visit (INDEPENDENT_AMBULATORY_CARE_PROVIDER_SITE_OTHER): Payer: 59 | Admitting: Endocrinology

## 2018-09-02 ENCOUNTER — Encounter: Payer: Self-pay | Admitting: Endocrinology

## 2018-09-02 VITALS — BP 134/78 | HR 75 | Ht 63.0 in | Wt 256.2 lb

## 2018-09-02 DIAGNOSIS — E1139 Type 2 diabetes mellitus with other diabetic ophthalmic complication: Secondary | ICD-10-CM | POA: Diagnosis not present

## 2018-09-02 DIAGNOSIS — E1165 Type 2 diabetes mellitus with hyperglycemia: Secondary | ICD-10-CM | POA: Diagnosis not present

## 2018-09-02 DIAGNOSIS — IMO0002 Reserved for concepts with insufficient information to code with codable children: Secondary | ICD-10-CM

## 2018-09-02 MED ORDER — METFORMIN HCL 500 MG PO TABS: 500.0000 mg | ORAL_TABLET | Freq: Every day | ORAL | 11 refills | Status: DC

## 2018-09-02 NOTE — Progress Notes (Signed)
Subjective:    Patient ID: Victoria Padilla, female    DOB: [birth year], 53 y.o.   MRN: [id number]  HPI Pt returns for f/u of diabetes mellitus: DM type: Insulin-requiring type 2 Dx'ed: 8338 Complications: DR Therapy: insulin since 2019, and 2 oral meds.   GDM: 1998, but DM persisted after the pregnancy.   DKA: never Severe hypoglycemia: never Pancreatitis: never Pancreatic imaging: normal on 2017 [place].  Other: she declined multiple daily injections.  she took trulicity for a few months in 2016; chronic abd pain and nausea have limited rx options.  she has thyroid pseudonodule.   Interval history: no cbg record, but states since hosp d/c, cbg varies from 65-200's, but most are in the 100's.  pt states she feels well in general.  Pt says she was missing insulin doses PTA, but not since.   Past Medical History:  Diagnosis Date   Acid reflux    Anemia    Arthritis    Cervical dysplasia    Diabetes mellitus    Heart murmur    Hyperlipidemia    Hypertension    LBP (low back pain)    Obesity    OSA (obstructive sleep apnea) 03/22/2018   New diagnosis. Mild.    Sarcoidosis    Vitamin D deficiency     Past Surgical History:  Procedure Laterality Date   BUNIONECTOMY     CHOLECYSTECTOMY     COLPOSCOPY     GYNECOLOGIC CRYOSURGERY     KNEE ARTHROSCOPY     TUBAL LIGATION      Social History   Socioeconomic History   Marital status: Single    Spouse name: Not on file   Number of children: 3   Years of education: Not on file   Highest education level: Not on file  Occupational History    Employer: [employer]  Social Needs   Financial resource strain: Not on file   Food insecurity    Worry: Not on file    Inability: Not on file   Transportation needs    Medical: Not on file    Non-medical: Not on file  Tobacco Use   Smoking status: Never Smoker   Smokeless tobacco: Never Used  Substance and Sexual Activity   Alcohol use: No   Drug use:  No   Sexual activity: Never    Birth control/protection: Surgical  Lifestyle   Physical activity    Days per week: Not on file    Minutes per session: Not on file   Stress: Not on file  Relationships   Social connections    Talks on phone: Not on file    Gets together: Not on file    Attends religious service: Not on file    Active member of club or organization: Not on file    Attends meetings of clubs or organizations: Not on file    Relationship status: Not on file   Intimate partner violence    Fear of current or ex partner: Not on file    Emotionally abused: Not on file    Physically abused: Not on file    Forced sexual activity: Not on file  Other Topics Concern   Not on file  Social History Narrative   Not on file    Current Outpatient Medications on File Prior to Visit  Medication Sig Dispense Refill   albuterol (VENTOLIN HFA) 108 (90 Base) MCG/ACT inhaler Inhale 2 puffs into the lungs every 6 (six) hours as  needed for wheezing or shortness of breath. 18 g 4   amLODipine (NORVASC) 5 MG tablet Take 1 tablet (5 mg total) by mouth daily. 30 tablet 2   B-D ULTRAFINE III SHORT PEN 31G X 8 MM MISC USE TO INJECT BASAGLAR DAILY 100 each 11   benazepril (LOTENSIN) 40 MG tablet TAKE 1 TABLET BY MOUTH EVERY DAY 90 tablet 0   benzonatate (TESSALON) 100 MG capsule Take 1 capsule (100 mg total) by mouth 3 (three) times daily as needed for cough. 30 capsule 0   cholecalciferol (VITAMIN D) 1000 units tablet Take 2,000 Units by mouth at bedtime.      dapagliflozin propanediol (FARXIGA) 5 MG TABS tablet Take 5 mg by mouth daily. 30 tablet 11   fluticasone (FLONASE) 50 MCG/ACT nasal spray Place 2 sprays into both nostrils daily as needed (seasonal allergies).      glucose blood (ONETOUCH VERIO) test strip 1 each by Other route 2 (two) times daily. And lancets 2/day 200 each 3   hydrochlorothiazide (HYDRODIURIL) 25 MG tablet Take 1 tablet (25 mg total) by mouth daily. 90  tablet 3   Insulin Glargine (BASAGLAR KWIKPEN) 100 UNIT/ML SOPN Inject 0.35 mLs (35 Units total) into the skin every morning. And pen needles 1/day 10 pen 3   ipratropium (ATROVENT) 0.06 % nasal spray Place 2 sprays into both nostrils 4 (four) times daily. 15 mL 0   metoprolol tartrate (LOPRESSOR) 25 MG tablet Take 1 tablet (25 mg total) by mouth 2 (two) times daily. 60 tablet 4   Multiple Vitamin (MULTIVITAMIN WITH MINERALS) TABS tablet Take 1 tablet by mouth at bedtime.     Olopatadine HCl 0.2 % SOLN Apply 1 drop to eye daily. 2.5 mL 0   Omega-3 Fatty Acids (FISH OIL PO) Take 1 capsule by mouth at bedtime. Reported on 08/08/2015     omeprazole (PRILOSEC) 40 MG capsule Take 1 capsule (40 mg total) by mouth daily. 30 capsule 3   ondansetron (ZOFRAN ODT) 4 MG disintegrating tablet Take 1 tablet (4 mg total) by mouth every 8 (eight) hours as needed for nausea or vomiting. 20 tablet 0   ONETOUCH DELICA LANCETS 51V MISC Test twice a day. Pt uses a one touch verio flex meter 100 each 5   Rivaroxaban (XARELTO) 15 MG TABS tablet Take 15 mg by mouth 2 (two) times daily with a meal. Pt is still on starter pack and will continue with 20mg  once finished     [START ON 09/08/2018] rivaroxaban (XARELTO) 20 MG TABS tablet Take 1 tablet (20 mg total) by mouth daily with supper. 30 tablet 4   simvastatin (ZOCOR) 20 MG tablet TAKE 1 TABLET BY MOUTH EVERYDAY AT BEDTIME 90 tablet 1   No current facility-administered medications on file prior to visit.     Allergies  Allergen Reactions   Naprosyn [Naproxen] Nausea Only    Family History  Problem Relation Age of Onset   Heart failure Mother 63       chf   Heart failure Father    Cancer Brother 74       prostate   Hyperlipidemia Brother    Hypertension Brother    Diabetes Brother    Hypertension Maternal Uncle    Hyperlipidemia Brother    Diabetes Brother    Colon cancer Maternal Grandfather    Colon polyps Brother    Heart  disease Brother    Stomach cancer Neg Hx    Esophageal cancer Neg Hx  Rectal cancer Neg Hx    Liver cancer Neg Hx     BP 134/78 (BP Location: Left Arm, Patient Position: Sitting, Cuff Size: Large)    Pulse 75    Ht 5\' 3"  (1.6 m)    Wt 256 lb 3.2 oz (116.2 kg)    SpO2 93%    BMI 45.38 kg/m    Review of Systems Denies LOC.  abd pain and nausea persist.      Objective:   Physical Exam VITAL SIGNS:  See vs page GENERAL: no distress Pulses: dorsalis pedis intact bilat.   MSK: no deformity of the feet CV: 1+ bilat leg edema Skin:  no ulcer on the feet.  normal color and temp on the feet.  Neuro: sensation is intact to touch on the feet.   Ext: there is bilateral onychomycosis of the toenails.    Lab Results  Component Value Date   HGBA1C 11.1 (H) 08/18/2018   Lab Results  Component Value Date   CREATININE 0.69 08/26/2018   BUN 9 08/26/2018   NA 136 08/26/2018   K 4.9 08/26/2018   CL 100 08/26/2018   CO2 23 08/26/2018       Assessment & Plan:  Insulin-requiring type 2 DM, with DR: glycemic control is apparently improved Noncompliance with insulin: apparently improved with admission. GI sxs, new: poss due to metformin Edema: Hypoglycemia: this limits aggressiveness of glycemic control   Patient Instructions  check your blood sugar twice a day.  vary the time of day when you check, between before the 3 meals, and at bedtime.  also check if you have symptoms of your blood sugar being too high or too low.  please keep a record of the readings and bring it to your next appointment here (or you can bring the meter itself).  You can write it on any piece of paper.  please call us sooner if your blood sugar goes below 70, or if you have a lot of readings over 200. Please reduce the metformin to just in the morning Please continue the same insulin. Please call if the stomach symptoms persist.   Please come back for a follow-up appointment in 2 months.

## 2018-09-02 NOTE — Patient Instructions (Addendum)
check your blood sugar twice a day.  vary the time of day when you check, between before the 3 meals, and at bedtime.  also check if you have symptoms of your blood sugar being too high or too low.  please keep a record of the readings and bring it to your next appointment here (or you can bring the meter itself).  You can write it on any piece of paper.  please call us sooner if your blood sugar goes below 70, or if you have a lot of readings over 200. Please reduce the metformin to just in the morning Please continue the same insulin. Please call if the stomach symptoms persist.   Please come back for a follow-up appointment in 2 months.

## 2018-09-03 ENCOUNTER — Telehealth: Payer: Self-pay | Admitting: Hematology

## 2018-09-03 NOTE — Telephone Encounter (Signed)
Spoke with patient re new patient appointment with Dr. Irene Limbo 7/13 @ 11 am to arrive 10:30 am. Demographic information confirmed.

## 2018-09-07 ENCOUNTER — Telehealth: Payer: Self-pay | Admitting: Hematology

## 2018-09-07 ENCOUNTER — Inpatient Hospital Stay: Payer: 59 | Attending: Hematology | Admitting: Hematology

## 2018-09-07 ENCOUNTER — Other Ambulatory Visit: Payer: Self-pay

## 2018-09-07 VITALS — BP 141/82 | HR 76 | Temp 99.1°F | Resp 18 | Ht 63.0 in | Wt 251.3 lb

## 2018-09-07 DIAGNOSIS — I2699 Other pulmonary embolism without acute cor pulmonale: Secondary | ICD-10-CM | POA: Insufficient documentation

## 2018-09-07 DIAGNOSIS — I2609 Other pulmonary embolism with acute cor pulmonale: Secondary | ICD-10-CM

## 2018-09-07 DIAGNOSIS — I82411 Acute embolism and thrombosis of right femoral vein: Secondary | ICD-10-CM | POA: Diagnosis present

## 2018-09-07 DIAGNOSIS — D869 Sarcoidosis, unspecified: Secondary | ICD-10-CM

## 2018-09-07 DIAGNOSIS — Z7901 Long term (current) use of anticoagulants: Secondary | ICD-10-CM | POA: Insufficient documentation

## 2018-09-07 DIAGNOSIS — I82413 Acute embolism and thrombosis of femoral vein, bilateral: Secondary | ICD-10-CM
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
## 2018-09-07 NOTE — Telephone Encounter (Signed)
Scheduled appt per 7/13 los. ° °Printed calendar and avs. °

## 2018-09-08 LAB — FRUCTOSAMINE: Fructosamine: 310 umol/L — ABNORMAL HIGH (ref 205–285)

## 2018-09-15 ENCOUNTER — Telehealth: Payer: Self-pay | Admitting: Cardiovascular Disease

## 2018-09-15 ENCOUNTER — Other Ambulatory Visit: Payer: 59

## 2018-09-15 ENCOUNTER — Other Ambulatory Visit: Payer: Self-pay

## 2018-09-15 DIAGNOSIS — I10 Essential (primary) hypertension: Secondary | ICD-10-CM

## 2018-09-15 DIAGNOSIS — E785 Hyperlipidemia, unspecified: Secondary | ICD-10-CM

## 2018-09-15 LAB — BASIC METABOLIC PANEL
BUN/Creatinine Ratio: 15 (ref 9–23)
BUN: 15 mg/dL (ref 6–24)
CO2: 24 mmol/L (ref 20–29)
Calcium: 10 mg/dL (ref 8.7–10.2)
Chloride: 97 mmol/L (ref 96–106)
Creatinine, Ser: 1.02 mg/dL — ABNORMAL HIGH (ref 0.57–1.00)
GFR calc Af Amer: 72 mL/min/{1.73_m2} (ref >59)
GFR calc non Af Amer: 62 mL/min/{1.73_m2} (ref >59)
Glucose: 208 mg/dL — ABNORMAL HIGH (ref 65–99)
Potassium: 4.6 mmol/L (ref 3.5–5.2)
Sodium: 139 mmol/L (ref 134–144)

## 2018-09-15 LAB — HEPATIC FUNCTION PANEL
ALT: 26 IU/L (ref 0–32)
AST: 25 IU/L (ref 0–40)
Albumin: 4.2 g/dL (ref 3.8–4.9)
Alkaline Phosphatase: 135 IU/L — ABNORMAL HIGH (ref 39–117)
Bilirubin Total: <0.2 mg/dL (ref 0.0–1.2)
Bilirubin, Direct: 0.08 mg/dL (ref 0.00–0.40)
Total Protein: 7.5 g/dL (ref 6.0–8.5)

## 2018-09-15 LAB — LIPID PANEL
Chol/HDL Ratio: 2.8 ratio (ref 0.0–4.4)
Cholesterol, Total: 142 mg/dL (ref 100–199)
HDL: 50 mg/dL (ref >39)
LDL Calculated: 69 mg/dL (ref 0–99)
Triglycerides: 117 mg/dL (ref 0–149)
VLDL Cholesterol Cal: 23 mg/dL (ref 5–40)

## 2018-09-15 NOTE — Telephone Encounter (Signed)
New message    Pt said she was suggested to do cardiac rehab and wants to know if Dr. Acie Fredrickson knows about this and if he does, could he give her the information? Also, she wants to know since she was in the hospital with blood clots if she starts feeling the sensation in her legs what should she do? And the last question had was about her throat, she said if she starts feeling the pressure again should she contact Dr. Acie Fredrickson or Harland Dingwall. Please call.

## 2018-09-15 NOTE — Telephone Encounter (Signed)
Follow up     Pt said she was tested for covid but it was negative and she has some questions about if it could of been overlooked. And if she is doing the right treatment.

## 2018-09-16 NOTE — Telephone Encounter (Signed)
Called patient and reviewed her stable lab results I advised her that Cardiac Rehab is a great program but that referral at this point might be difficult due to Covid-19 restrictions and the fact that priority must be given to cardiac surgery patients and others with acute cardiac conditions. I encouraged her to exercise as much as possible at home until gyms open back up. She states she is using her treadmill. I advised her to contact Harland Dingwall, NP or our office for concerns regarding future DVT and the importance of continuing Xarelto. I also emphasized the importance of not being immobile for long periods of time. Patient verbalized understanding and agreement and thanked me for the call.

## 2018-09-18 ENCOUNTER — Telehealth: Payer: Self-pay | Admitting: Family Medicine

## 2018-09-18 NOTE — Telephone Encounter (Signed)
She can take Tylenol.  Make sure she drinks plenty of fluids.  Doubt that it is more clots.

## 2018-09-18 NOTE — Telephone Encounter (Signed)
Pt called and stated that she just got out of the hospital yesterday for blood clots in her lungs and legs. She was put on a blood thinner but today she is having cramps. She wants to know if maybe she is having more blood clots or is it a side effect of the blood thinner or is it something else she should be doing. She can be reached at [phone]

## 2018-09-18 NOTE — Telephone Encounter (Signed)
Pt was advised [facility] 

## 2018-09-20 ENCOUNTER — Other Ambulatory Visit: Payer: Self-pay | Admitting: Family Medicine

## 2018-09-20 DIAGNOSIS — I10 Essential (primary) hypertension: Secondary | ICD-10-CM

## 2018-09-30 ENCOUNTER — Other Ambulatory Visit: Payer: Self-pay | Admitting: Family Medicine

## 2018-09-30 DIAGNOSIS — I2609 Other pulmonary embolism with acute cor pulmonale: Secondary | ICD-10-CM

## 2018-09-30 DIAGNOSIS — Z9981 Dependence on supplemental oxygen: Secondary | ICD-10-CM

## 2018-09-30 DIAGNOSIS — D869 Sarcoidosis, unspecified: Secondary | ICD-10-CM

## 2018-09-30 DIAGNOSIS — G4733 Obstructive sleep apnea (adult) (pediatric): Secondary | ICD-10-CM

## 2018-10-01 ENCOUNTER — Other Ambulatory Visit: Payer: Self-pay

## 2018-10-01 DIAGNOSIS — IMO0002 Reserved for concepts with insufficient information to code with codable children: Secondary | ICD-10-CM

## 2018-10-01 DIAGNOSIS — E1139 Type 2 diabetes mellitus with other diabetic ophthalmic complication: Secondary | ICD-10-CM

## 2018-10-01 MED ORDER — ONETOUCH DELICA LANCETS 33G MISC: 2 refills | Status: DC

## 2018-10-23 ENCOUNTER — Other Ambulatory Visit: Payer: Self-pay

## 2018-10-23 ENCOUNTER — Encounter: Payer: Self-pay | Admitting: Pulmonary Disease

## 2018-10-23 ENCOUNTER — Ambulatory Visit (INDEPENDENT_AMBULATORY_CARE_PROVIDER_SITE_OTHER): Payer: 59 | Admitting: Pulmonary Disease

## 2018-10-23 VITALS — BP 128/76 | HR 113 | Temp 98.4°F | Ht 63.0 in | Wt 254.8 lb

## 2018-10-23 DIAGNOSIS — G4733 Obstructive sleep apnea (adult) (pediatric): Secondary | ICD-10-CM

## 2018-10-23 DIAGNOSIS — D869 Sarcoidosis, unspecified: Secondary | ICD-10-CM

## 2018-10-23 DIAGNOSIS — I2699 Other pulmonary embolism without acute cor pulmonale: Secondary | ICD-10-CM

## 2018-10-23 NOTE — Assessment & Plan Note (Signed)
She appears to have improved, does not desaturate on walking. We will discontinue oxygen. Given unprovoked nature and life-threatening PE, will likely need anticoagulation long-term if not lifelong

## 2018-10-23 NOTE — Progress Notes (Signed)
Subjective:    Patient ID: Victoria Padilla, female    DOB: [birth year], 53 y.o.   MRN: [id number]  HPI   Chief Complaint  Patient presents with  . sleep consult    exhausted during daytime, no energy    53 year old diabetic presents for evaluation of OSA.  She also desires reassessment of her pulmonary issues-sarcoidosis and PE.  Sarcoidosis was diagnosed more than 10 years ago by liver biopsy.  She is a lifetime nondrinker, but imaging suggested a cirrhotic looking liver.  She has not required steroids.  She denies skin or eye involvement.  She was hospitalized 07/2018 with a presyncopal episode and was found to have bilateral lower extremity DVT and bilateral pulmonary embolism with an RV/LV ratio of 2.0.  This appeared to be unprovoked no clear risk factor was identified she was transitioned to Xarelto.  Echo 6/21 showed RVSP of 56 mm consistent with RV strain.  She was discharged on 2 L of oxygen and is gradually weaned herself off of this. On ambulation today she did not desaturate although her heart rate increased from 10 8-1 33 She saw hematology and hypercoagulable work-up was negative  She reports excessive daytime somnolence and fatigue.  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  She denies dryness of mouth or morning headaches She keeps her grandchild so is unable to nap during the day.  She has gained about 25 pounds in the last 2 years and attributes it to a sedentary lifestyle. She underwent home sleep testing which showed mild OSA and no intervention was recommended  Significant tests/ events reviewed 02/2018 HST mild OSA AHI 10/h  CT  angiogram 07/2018 >> bilateral PE, lateral calcified mediastinal and hilar lymph nodes, nodular liver contour consistent with cirrhosis    Past Medical History:  Diagnosis Date  . Acid reflux   . Anemia   . Arthritis   . Asthma   . Cervical dysplasia   . Diabetes mellitus   . DVT (deep venous thrombosis) ([place])   . Heart murmur   . Hyperlipidemia   . Hypertension   . LBP (low back pain)   . Obesity   . OSA (obstructive sleep apnea) 03/22/2018   New diagnosis. Mild.   . Pulmonary embolism ([place])   . Sarcoidosis   . Vitamin D deficiency      Past Surgical History:  Procedure Laterality Date  . BUNIONECTOMY    . CHOLECYSTECTOMY    . COLPOSCOPY    . GYNECOLOGIC CRYOSURGERY    . KNEE ARTHROSCOPY    . TUBAL LIGATION      Allergies  Allergen Reactions  . Naprosyn [Naproxen] Nausea Only    Social History   Socioeconomic History  . Marital status: Single    Spouse name: Not on file  . Number of children: 3  . Years of education: Not on file  . Highest education level: Not on file  Occupational History    Employer: [employer]  . Financial resource strain: Not on file  . Food insecurity    Worry: Not on  file    Inability: Not on file  . Transportation needs    Medical: Not on file    Non-medical: Not on file  Tobacco Use  . Smoking status: Never Smoker  . Smokeless tobacco: Never Used  Substance and Sexual Activity  . Alcohol use: No  . Drug use: No  . Sexual activity: Never    Birth control/protection: Surgical  Lifestyle  . Physical activity    Days per week: Not on file    Minutes per session: Not on file  . Stress: Not on file  Relationships  . Social Herbalist on phone: Not on file    Gets together: Not on file    Attends religious service: Not on file    Active member of club or organization: Not on file    Attends meetings of clubs or organizations: Not on file    Relationship status: Not on file  . Intimate partner  violence    Fear of current or ex partner: Not on file    Emotionally abused: Not on file    Physically abused: Not on file    Forced sexual activity: Not on file  Other Topics Concern  . Not on file  Social History Narrative  . Not on file        Family History  Problem Relation Age of Onset  . Heart failure Mother 57       chf  . Heart failure Father   . Cancer Brother 55       prostate  . Hypertension Maternal Uncle   . Hyperlipidemia Brother   . Diabetes Brother   . Colon cancer Maternal Grandfather   . Colon polyps Brother   . Diabetes Brother   . Hyperlipidemia Brother   . Stomach cancer Neg Hx   . Esophageal cancer Neg Hx   . Rectal cancer Neg Hx   . Liver cancer Neg Hx      Review of Systems  Constitutional: Negative for fever and unexpected weight change.  HENT: Positive for sinus pressure and sneezing. Negative for congestion, dental problem, ear pain, nosebleeds, postnasal drip, rhinorrhea, sore throat and trouble swallowing.   Eyes: Positive for itching. Negative for redness.  Respiratory: Negative for cough, chest tightness, shortness of breath and wheezing.   Cardiovascular: Positive for leg swelling. Negative for palpitations.  Gastrointestinal: Negative for nausea and vomiting.  Genitourinary: Negative for dysuria.  Musculoskeletal: Negative for joint swelling.  Skin: Negative for rash.  Allergic/Immunologic: Negative.  Negative for environmental allergies, food allergies and immunocompromised state.  Neurological: Negative for headaches.  Hematological: Does not bruise/bleed easily.  Psychiatric/Behavioral: Negative for dysphoric mood. The patient is nervous/anxious.        Objective:   Physical Exam   Gen. Pleasant, obese, in no distress, normal affect ENT - no pallor,icterus, no post nasal drip, class 2-3 airway Neck: No JVD, no thyromegaly, no carotid bruits Lungs: no use of accessory muscles, no dullness to percussion, decreased without  rales or rhonchi  Cardiovascular: Rhythm regular, heart sounds  normal, no murmurs or gallops, no peripheral edema Abdomen: soft and non-tender, no hepatosplenomegaly, BS normal. Musculoskeletal: No deformities, no cyanosis or clubbing Neuro:  alert, non focal, no tremors        Assessment & Plan:

## 2018-10-23 NOTE — Assessment & Plan Note (Signed)
This does appear to be mild OSA by numbers however she seems to be very symptomatic with daytime fatigue and somnolence.  We will undertake therapeutic trial of CPAP to see if she has benefit. We will trial auto CPAP 5 to 12 cm with nasal pillows will reassess in 1 month I emphasized compliance

## 2018-10-23 NOTE — Patient Instructions (Signed)
Discontinue oxygen. Stay on Xarelto Trial of auto CPAP 5 to 12 cm with nasal pillows

## 2018-10-23 NOTE — Assessment & Plan Note (Signed)
She seems to have liver involvement but liver function is okay by LFTs. She has mediastinal and hilar calcified lymph nodes but no evidence of parenchymal involvement. Doubt she needs PFTs at this point since she is asymptomatic

## 2018-10-30 ENCOUNTER — Other Ambulatory Visit: Payer: Self-pay

## 2018-11-04 ENCOUNTER — Other Ambulatory Visit: Payer: Self-pay

## 2018-11-04 ENCOUNTER — Encounter: Payer: Self-pay | Admitting: Endocrinology

## 2018-11-04 ENCOUNTER — Ambulatory Visit (INDEPENDENT_AMBULATORY_CARE_PROVIDER_SITE_OTHER): Payer: 59 | Admitting: Endocrinology

## 2018-11-04 VITALS — Ht 63.0 in | Wt 256.0 lb

## 2018-11-04 DIAGNOSIS — E1165 Type 2 diabetes mellitus with hyperglycemia: Secondary | ICD-10-CM

## 2018-11-04 DIAGNOSIS — E119 Type 2 diabetes mellitus without complications: Secondary | ICD-10-CM | POA: Diagnosis not present

## 2018-11-04 DIAGNOSIS — Z794 Long term (current) use of insulin: Secondary | ICD-10-CM

## 2018-11-04 DIAGNOSIS — E113299 Type 2 diabetes mellitus with mild nonproliferative diabetic retinopathy without macular edema, unspecified eye: Secondary | ICD-10-CM

## 2018-11-04 DIAGNOSIS — IMO0002 Reserved for concepts with insufficient information to code with codable children: Secondary | ICD-10-CM

## 2018-11-04 LAB — POCT GLYCOSYLATED HEMOGLOBIN (HGB A1C): Hemoglobin A1C: 10.5 % — AB (ref 4.0–5.6)

## 2018-11-04 NOTE — Progress Notes (Signed)
Subjective:    Patient ID: Victoria Padilla, female    DOB: [birth year], 53 y.o.   MRN: [id number]  HPI Pt returns for f/u of diabetes mellitus: DM type: Insulin-requiring type 2 Dx'ed: [id number] Complications: DR Therapy: insulin since 2019, and 2 oral meds.   GDM: 1998, but DM persisted after the pregnancy.   DKA: never Severe hypoglycemia: never Pancreatitis: never Pancreatic imaging: normal on 2017 [place].  Other: she declined multiple daily injections.  she took trulicity for a few months in 2016; chronic abd pain and nausea have limited rx options.  she has thyroid pseudonodule.   Interval history: Meter is downloaded today, and the printout is scanned into the record.  cbg varies from 135-367, but most are in the 200's.  It is in general higher as the day goes on.  pt states she feels well in general.  Pt says she seldom misses insulin doses.    Past Medical History:  Diagnosis Date  . Acid reflux   . Anemia   . Arthritis   . Asthma   . Cervical dysplasia   . Diabetes mellitus   . DVT (deep venous thrombosis) ([place])   . Heart murmur   . Hyperlipidemia   . Hypertension   . LBP (low back pain)   . Obesity   . OSA (obstructive sleep apnea) 03/22/2018   New diagnosis. Mild.   . Pulmonary embolism ([place])   . Sarcoidosis   . Vitamin D deficiency     Past Surgical History:  Procedure Laterality Date  . BUNIONECTOMY    . CHOLECYSTECTOMY    . COLPOSCOPY    . GYNECOLOGIC CRYOSURGERY    . KNEE ARTHROSCOPY    . TUBAL LIGATION      Social History   Socioeconomic History  . Marital status: Single    Spouse name: Not on file  . Number of children: 3  . Years of education: Not on file  . Highest education level: Not on file  Occupational History    Employer: [employer]  . Financial resource strain: Not on file  . Food insecurity    Worry: Not on file    Inability: Not on file  . Transportation needs    Medical: Not on file    Non-medical: Not on file   Tobacco Use  . Smoking status: Never Smoker  . Smokeless tobacco: Never Used  Substance and Sexual Activity  . Alcohol use: No  . Drug use: No  . Sexual activity: Never    Birth control/protection: Surgical  Lifestyle  . Physical activity    Days per week: Not on file    Minutes per session: Not on file  . Stress: Not on file  Relationships  . Social Herbalist on phone: Not on file    Gets together: Not on file    Attends religious service: Not on file    Active member of club or organization: Not on file    Attends meetings of clubs or organizations: Not on file    Relationship status: Not on file  . Intimate partner violence    Fear of current or ex partner: Not on file    Emotionally abused: Not on file    Physically abused: Not on file    Forced sexual activity: Not on file  Other Topics Concern  . Not on file  Social History Narrative  . Not on file    Current Outpatient Medications  on File Prior to Visit  Medication Sig Dispense Refill  . albuterol (VENTOLIN HFA) 108 (90 Base) MCG/ACT inhaler Inhale 2 puffs into the lungs every 6 (six) hours as needed for wheezing or shortness of breath. 18 g 4  . amLODipine (NORVASC) 5 MG tablet TAKE 1 TABLET BY MOUTH EVERY DAY 90 tablet 0  . B-D ULTRAFINE III SHORT PEN 31G X 8 MM MISC USE TO INJECT BASAGLAR DAILY 100 each 11  . benazepril (LOTENSIN) 40 MG tablet TAKE 1 TABLET BY MOUTH EVERY DAY 90 tablet 0  . cholecalciferol (VITAMIN D) 1000 units tablet Take 2,000 Units by mouth at bedtime.     . dapagliflozin propanediol (FARXIGA) 5 MG TABS tablet Take 5 mg by mouth daily. 30 tablet 11  . fluticasone (FLONASE) 50 MCG/ACT nasal spray Place 2 sprays into both nostrils daily as needed (seasonal allergies).     Marland Kitchen glucose blood (ONETOUCH VERIO) test strip 1 each by Other route 2 (two) times daily. And lancets 2/day 200 each 3  . hydrochlorothiazide (HYDRODIURIL) 25 MG tablet Take 1 tablet (25 mg total) by mouth daily. 90  tablet 3  . Insulin Glargine (BASAGLAR KWIKPEN) 100 UNIT/ML SOPN Inject 0.35 mLs (35 Units total) into the skin every morning. And pen needles 1/day 10 pen 3  . ipratropium (ATROVENT) 0.06 % nasal spray Place 2 sprays into both nostrils 4 (four) times daily. 15 mL 0  . metFORMIN (GLUCOPHAGE) 500 MG tablet Take 1 tablet (500 mg total) by mouth daily with breakfast. 30 tablet 11  . metoprolol tartrate (LOPRESSOR) 25 MG tablet Take 1 tablet (25 mg total) by mouth 2 (two) times daily. 60 tablet 4  . Multiple Vitamin (MULTIVITAMIN WITH MINERALS) TABS tablet Take 1 tablet by mouth at bedtime.    . Olopatadine HCl 0.2 % SOLN Apply 1 drop to eye daily. 2.5 mL 0  . Omega-3 Fatty Acids (FISH OIL PO) Take 1 capsule by mouth at bedtime. Reported on 08/08/2015    . omeprazole (PRILOSEC) 40 MG capsule Take 1 capsule (40 mg total) by mouth daily. 30 capsule 3  . OneTouch Delica Lancets 99991111 MISC Use to monitor glucose levels three times daily; E11.8, E11.65 100 each 2  . rivaroxaban (XARELTO) 20 MG TABS tablet Take 1 tablet (20 mg total) by mouth daily with supper. 30 tablet 4  . simvastatin (ZOCOR) 20 MG tablet TAKE 1 TABLET BY MOUTH EVERYDAY AT BEDTIME 90 tablet 1   No current facility-administered medications on file prior to visit.     Allergies  Allergen Reactions  . Naprosyn [Naproxen] Nausea Only    Family History  Problem Relation Age of Onset  . Heart failure Mother 32       chf  . Heart failure Father   . Cancer Brother 56       prostate  . Hypertension Maternal Uncle   . Hyperlipidemia Brother   . Diabetes Brother   . Colon cancer Maternal Grandfather   . Colon polyps Brother   . Diabetes Brother   . Hyperlipidemia Brother   . Stomach cancer Neg Hx   . Esophageal cancer Neg Hx   . Rectal cancer Neg Hx   . Liver cancer Neg Hx     Ht 5\' 3"  (1.6 m)   Wt 256 lb (116.1 kg)   SpO2 97%   BMI 45.35 kg/m    Review of Systems She denies hypoglycemia.      Objective:   Physical  Exam VITAL  SIGNS:  See vs page GENERAL: no distress Pulses: dorsalis pedis intact bilat.   MSK: no deformity of the feet CV: trace bilat leg edema Skin:  no ulcer on the feet.  normal color and temp on the feet.  Old healed surgical scars on both feet Neuro: sensation is intact to touch on the feet.   Ext: there is bilateral onychomycosis of the toenails.     Lab Results  Component Value Date   CREATININE 1.02 (H) 09/15/2018   BUN 15 09/15/2018   NA 139 09/15/2018   K 4.6 09/15/2018   CL 97 09/15/2018   CO2 24 09/15/2018    Lab Results  Component Value Date   HGBA1C 10.5 (A) 11/04/2018       Assessment & Plan:  Insulin-requiring type 2 DM, with DR: uncertain glucemic control.  Check fructosamine Edema: This limits rx options  Patient Instructions  check your blood sugar twice a day.  vary the time of day when you check, between before the 3 meals, and at bedtime.  also check if you have symptoms of your blood sugar being too high or too low.  please keep a record of the readings and bring it to your next appointment here (or you can bring the meter itself).  You can write it on any piece of paper.  please call us sooner if your blood sugar goes below 70, or if you have a lot of readings over 200. A different type of diabetes blood test is requested for you today.  We'll let you know about the results.  If it is high, let's increase the [name].  Please come back for a follow-up appointment in 2 months.

## 2018-11-04 NOTE — Patient Instructions (Addendum)
check your blood sugar twice a day.  vary the time of day when you check, between before the 3 meals, and at bedtime.  also check if you have symptoms of your blood sugar being too high or too low.  please keep a record of the readings and bring it to your next appointment here (or you can bring the meter itself).  You can write it on any piece of paper.  please call us sooner if your blood sugar goes below 70, or if you have a lot of readings over 200. A different type of diabetes blood test is requested for you today.  We'll let you know about the results.  If it is high, let's increase the [name].  Please come back for a follow-up appointment in 2 months.

## 2018-11-07 LAB — FRUCTOSAMINE: Fructosamine: 374 umol/L — ABNORMAL HIGH (ref 205–285)

## 2018-11-08 ENCOUNTER — Other Ambulatory Visit: Payer: Self-pay | Admitting: Endocrinology

## 2018-11-08 MED ORDER — FARXIGA 10 MG PO TABS: 10.0000 mg | ORAL_TABLET | Freq: Every day | ORAL | 3 refills | Status: DC

## 2018-11-13 ENCOUNTER — Other Ambulatory Visit: Payer: Self-pay | Admitting: Family Medicine

## 2018-11-13 DIAGNOSIS — I10 Essential (primary) hypertension: Secondary | ICD-10-CM

## 2018-11-26 ENCOUNTER — Other Ambulatory Visit: Payer: Self-pay | Admitting: Family Medicine

## 2018-11-26 DIAGNOSIS — I152 Hypertension secondary to endocrine disorders: Secondary | ICD-10-CM

## 2018-11-26 DIAGNOSIS — E1159 Type 2 diabetes mellitus with other circulatory complications: Secondary | ICD-10-CM

## 2018-11-30 ENCOUNTER — Other Ambulatory Visit: Payer: Self-pay | Admitting: Family Medicine

## 2018-11-30 ENCOUNTER — Other Ambulatory Visit: Payer: Self-pay

## 2018-11-30 DIAGNOSIS — Z1231 Encounter for screening mammogram for malignant neoplasm of breast: Secondary | ICD-10-CM

## 2018-12-01 ENCOUNTER — Ambulatory Visit (INDEPENDENT_AMBULATORY_CARE_PROVIDER_SITE_OTHER): Payer: 59 | Admitting: Women's Health

## 2018-12-01 ENCOUNTER — Encounter: Payer: Self-pay | Admitting: Women's Health

## 2018-12-01 VITALS — Ht 63.0 in | Wt 258.0 lb

## 2018-12-01 DIAGNOSIS — Z01411 Encounter for gynecological examination (general) (routine) with abnormal findings: Secondary | ICD-10-CM | POA: Diagnosis not present

## 2018-12-01 DIAGNOSIS — N898 Other specified noninflammatory disorders of vagina: Secondary | ICD-10-CM

## 2018-12-01 LAB — WET PREP FOR TRICH, YEAST, CLUE

## 2018-12-01 MED ORDER — TERCONAZOLE 0.4 % VA CREA: 1.0000 | TOPICAL_CREAM | Freq: Every day | VAGINAL | 0 refills | Status: DC

## 2018-12-01 NOTE — Patient Instructions (Signed)
Carbohydrate Counting for Diabetes Mellitus, Adult  Carbohydrate counting is a method of keeping track of how many carbohydrates you eat. Eating carbohydrates naturally increases the amount of sugar (glucose) in the blood. Counting how many carbohydrates you eat helps keep your blood glucose within normal limits, which helps you manage your diabetes (diabetes mellitus). It is important to know how many carbohydrates you can safely have in each meal. This is different for every person. A diet and nutrition specialist (registered dietitian) can help you make a meal plan and calculate how many carbohydrates you should have at each meal and snack. Carbohydrates are found in the following foods:  Grains, such as breads and cereals.  Dried beans and soy products.  Starchy vegetables, such as potatoes, peas, and corn.  Fruit and fruit juices.  Milk and yogurt.  Sweets and snack foods, such as cake, cookies, candy, chips, and soft drinks. How do I count carbohydrates? There are two ways to count carbohydrates in food. You can use either of the methods or a combination of both. Reading "Nutrition Facts" on packaged food The "Nutrition Facts" list is included on the labels of almost all packaged foods and beverages in the [place] It includes:  The serving size.  Information about nutrients in each serving, including the grams (g) of carbohydrate per serving. To use the "Nutrition Facts":  Decide how many servings you will have.  Multiply the number of servings by the number of carbohydrates per serving.  The resulting number is the total amount of carbohydrates that you will be having. Learning standard serving sizes of other foods When you eat carbohydrate foods that are not packaged or do not include "Nutrition Facts" on the label, you need to measure the servings in order to count the amount of carbohydrates:  Measure the foods that you will eat with a food scale or measuring cup, if needed.   Decide how many standard-size servings you will eat.  Multiply the number of servings by 15. Most carbohydrate-rich foods have about 15 g of carbohydrates per serving. ? For example, if you eat 8 oz (170 g) of strawberries, you will have eaten 2 servings and 30 g of carbohydrates (2 servings x 15 g = 30 g).  For foods that have more than one food mixed, such as soups and casseroles, you must count the carbohydrates in each food that is included. The following list contains standard serving sizes of common carbohydrate-rich foods. Each of these servings has about 15 g of carbohydrates:   hamburger bun or  English muffin.   oz (15 mL) syrup.   oz (14 g) jelly.  1 slice of bread.  1 six-inch tortilla.  3 oz (85 g) cooked rice or pasta.  4 oz (113 g) cooked dried beans.  4 oz (113 g) starchy vegetable, such as peas, corn, or potatoes.  4 oz (113 g) hot cereal.  4 oz (113 g) mashed potatoes or  of a large baked potato.  4 oz (113 g) canned or frozen fruit.  4 oz (120 mL) fruit juice.  4-6 crackers.  6 chicken nuggets.  6 oz (170 g) unsweetened dry cereal.  6 oz (170 g) plain fat-free yogurt or yogurt sweetened with artificial sweeteners.  8 oz (240 mL) milk.  8 oz (170 g) fresh fruit or one small piece of fruit.  24 oz (680 g) popped popcorn. Example of carbohydrate counting Sample meal  3 oz (85 g) chicken breast.  6 oz (170 g)   brown rice.  4 oz (113 g) corn.  8 oz (240 mL) milk.  8 oz (170 g) strawberries with sugar-free whipped topping. Carbohydrate calculation 1. Identify the foods that contain carbohydrates: ? Rice. ? Corn. ? Milk. ? Strawberries. 2. Calculate how many servings you have of each food: ? 2 servings rice. ? 1 serving corn. ? 1 serving milk. ? 1 serving strawberries. 3. Multiply each number of servings by 15 g: ? 2 servings rice x 15 g = 30 g. ? 1 serving corn x 15 g = 15 g. ? 1 serving milk x 15 g = 15 g. ? 1 serving  strawberries x 15 g = 15 g. 4. Add together all of the amounts to find the total grams of carbohydrates eaten: ? 30 g + 15 g + 15 g + 15 g = 75 g of carbohydrates total. Summary  Carbohydrate counting is a method of keeping track of how many carbohydrates you eat.  Eating carbohydrates naturally increases the amount of sugar (glucose) in the blood.  Counting how many carbohydrates you eat helps keep your blood glucose within normal limits, which helps you manage your diabetes.  A diet and nutrition specialist (registered dietitian) can help you make a meal plan and calculate how many carbohydrates you should have at each meal and snack. This information is not intended to replace advice given to you by your health care provider. Make sure you discuss any questions you have with your health care provider. Document Released: 02/11/2005 Document Revised: 09/05/2016 Document Reviewed: 07/26/2015 Elsevier Patient Education  2020 [place] Maintenance for Postmenopausal Women Menopause is a normal process in which your ability to get pregnant comes to an end. This process happens slowly over many months or years, usually between the ages of 72 and 25. Menopause is complete when you have missed your menstrual periods for 12 months. It is important to talk with your health care provider about some of the most common conditions that affect women after menopause (postmenopausal women). These include heart disease, cancer, and bone loss (osteoporosis). Adopting a healthy lifestyle and getting preventive care can help to promote your health and wellness. The actions you take can also lower your chances of developing some of these common conditions. What should I know about menopause? During menopause, you may get a number of symptoms, such as:  Hot flashes. These can be moderate or severe.  Night sweats.  Decrease in sex drive.  Mood swings.  Headaches.  Tiredness.  Irritability.   Memory problems.  Insomnia. Choosing to treat or not to treat these symptoms is a decision that you make with your health care provider. Do I need hormone replacement therapy?  Hormone replacement therapy is effective in treating symptoms that are caused by menopause, such as hot flashes and night sweats.  Hormone replacement carries certain risks, especially as you become older. If you are thinking about using estrogen or estrogen with progestin, discuss the benefits and risks with your health care provider. What is my risk for heart disease and stroke? The risk of heart disease, heart attack, and stroke increases as you age. One of the causes may be a change in the body's hormones during menopause. This can affect how your body uses dietary fats, triglycerides, and cholesterol. Heart attack and stroke are medical emergencies. There are many things that you can do to help prevent heart disease and stroke. Watch your blood pressure  High blood pressure causes heart disease and increases  the risk of stroke. This is more likely to develop in people who have high blood pressure readings, are of African descent, or are overweight.  Have your blood pressure checked: ? Every 3-5 years if you are 58-19 years of age. ? Every year if you are 62 years old or older. Eat a healthy diet   Eat a diet that includes plenty of vegetables, fruits, low-fat dairy products, and lean protein.  Do not eat a lot of foods that are high in solid fats, added sugars, or sodium. Get regular exercise Get regular exercise. This is one of the most important things you can do for your health. Most adults should:  Try to exercise for at least 150 minutes each week. The exercise should increase your heart rate and make you sweat (moderate-intensity exercise).  Try to do strengthening exercises at least twice each week. Do these in addition to the moderate-intensity exercise.  Spend less time sitting. Even light physical  activity can be beneficial. Other tips  Work with your health care provider to achieve or maintain a healthy weight.  Do not use any products that contain nicotine or tobacco, such as cigarettes, e-cigarettes, and chewing tobacco. If you need help quitting, ask your health care provider.  Know your numbers. Ask your health care provider to check your cholesterol and your blood sugar (glucose). Continue to have your blood tested as directed by your health care provider. Do I need screening for cancer? Depending on your health history and family history, you may need to have cancer screening at different stages of your life. This may include screening for:  Breast cancer.  Cervical cancer.  Lung cancer.  Colorectal cancer. What is my risk for osteoporosis? After menopause, you may be at increased risk for osteoporosis. Osteoporosis is a condition in which bone destruction happens more quickly than new bone creation. To help prevent osteoporosis or the bone fractures that can happen because of osteoporosis, you may take the following actions:  If you are 63-20 years old, get at least 1,000 mg of calcium and at least 600 mg of vitamin D per day.  If you are older than age 38 but younger than age 60, get at least 1,200 mg of calcium and at least 600 mg of vitamin D per day.  If you are older than age 8, get at least 1,200 mg of calcium and at least 800 mg of vitamin D per day. Smoking and drinking excessive alcohol increase the risk of osteoporosis. Eat foods that are rich in calcium and vitamin D, and do weight-bearing exercises several times each week as directed by your health care provider. How does menopause affect my mental health? Depression may occur at any age, but it is more common as you become older. Common symptoms of depression include:  Low or sad mood.  Changes in sleep patterns.  Changes in appetite or eating patterns.  Feeling an overall lack of motivation or  enjoyment of activities that you previously enjoyed.  Frequent crying spells. Talk with your health care provider if you think that you are experiencing depression. General instructions See your health care provider for regular wellness exams and vaccines. This may include:  Scheduling regular health, dental, and eye exams.  Getting and maintaining your vaccines. These include: ? Influenza vaccine. Get this vaccine each year before the flu season begins. ? Pneumonia vaccine. ? Shingles vaccine. ? Tetanus, diphtheria, and pertussis (Tdap) booster vaccine. Your health care provider may also recommend other  immunizations. Tell your health care provider if you have ever been abused or do not feel safe at home. Summary  Menopause is a normal process in which your ability to get pregnant comes to an end.  This condition causes hot flashes, night sweats, decreased interest in sex, mood swings, headaches, or lack of sleep.  Treatment for this condition may include hormone replacement therapy.  Take actions to keep yourself healthy, including exercising regularly, eating a healthy diet, watching your weight, and checking your blood pressure and blood sugar levels.  Get screened for cancer and depression. Make sure that you are up to date with all your vaccines. This information is not intended to replace advice given to you by your health care provider. Make sure you discuss any questions you have with your health care provider. Document Released: 04/05/2005 Document Revised: 02/04/2018 Document Reviewed: 02/04/2018 Elsevier Patient Education  2020 [employer].

## 2018-12-01 NOTE — Progress Notes (Signed)
Victoria Padilla Victoria Padilla [birth year] [id number]    History:    Presents for annual exam.  1 cycle this past year, occasional hot flushes.  Not sexually active in many years.  1998 cryo with normal Paps after.  04/2017- left breast biopsy.  2018- colonoscopy.  07/2018 pulmonary embolism on Xarelto.  Biggest problem is uncontrolled diabetes, last hemoglobin A1c 10.5.  Endocrinologist has increased bedtime NPH to 45 units and Farxiga  twice daily along with metformin.  Has scheduled follow-up with nutritionist as well as endocrinologist.  Aware of complications of diabetes and is working hard to try to make lifestyle changes also.  Mother died of complications of diabetes.  Primary care manages hypertension, hypercholesteremia, asthma, and GERD.  Past medical history, past surgical history, family history and social history were all reviewed and documented in the EPIC chart.  Works for [employer].  Has a masters in early education.  Parents hypertension and heart disease.  Has 3 children and 2 grandchildren all doing well.  ROS:  A ROS was performed and pertinent positives and negatives are included.  Exam:  Vitals:   12/01/18 1212  Weight: 258 lb (117 kg)  Height: 5\' 3"  (1.6 m)   Body mass index is 45.7 kg/m.   General appearance:  Normal Thyroid:  Symmetrical, normal in size, without palpable masses or nodularity. Respiratory  Auscultation:  Clear without wheezing or rhonchi Cardiovascular  Auscultation:  Regular rate, without rubs, murmurs or gallops  Edema/varicosities:  Not grossly evident Abdominal  Soft,nontender, without masses, guarding or rebound.  Liver/spleen:  No organomegaly noted  Hernia:  None appreciated  Skin  Inspection:  Grossly normal   Breasts: Examined lying and sitting.     Right: Without masses, retractions, discharge or axillary adenopathy.     Left: Without masses, retractions, discharge or axillary adenopathy. Gentitourinary   Inguinal/mons:  Normal without inguinal  adenopathy  External genitalia:  Normal  BUS/Urethra/Skene's glands:  Normal  Vagina: Erythemic, dry, wet prep positive for yeast   Cervix:  Normal  Uterus: normal in size, shape and contour.  Midline and mobile  Adnexa/parametria:     Rt: Without masses or tenderness.   Lt: Without masses or tenderness.  Anus and perineum: Normal  Digital rectal exam: Normal sphincter tone without palpated masses or tenderness  Assessment/Plan:  53 y.o. SBF G3, P3 for annual exam with complaint of vaginal irritation and difficulty getting hemoglobin A1c lowered.  Perimenopausal/not sexually active Uncontrolled diabetes-endocrinologist managing Hypertension, hypercholesterolemia, GERD-primary care managing labs and meds 07/2018 PE on Xarelto per cardiologist 1998 cryo normal Paps after Obesity  Plan: Terazol 7 1 applicator for several nights most of irritation is external apply Terazol cream externally and call if no relief.  Menopause reviewed, minimal symptoms other than only 1 cycle in the past year.  Instructed to call if any further bleeding.  SBEs, continue annual screening mammograms.  Increase regular exercise, calcium rich foods, vitamin D 2000 daily encouraged.  Keep scheduled appointment with nutritionist and encouraged to do a diet history for several days and take to appointment.  Encouraged a low-carb/calorie diet.  Shingrex discussed and encouraged.  Pap normal 2019, new screening guidelines reviewed.   She does have ear    [place], 1:05 PM 12/01/2018

## 2018-12-07 ENCOUNTER — Encounter: Payer: Self-pay | Admitting: Bariatrics

## 2018-12-07 ENCOUNTER — Other Ambulatory Visit: Payer: Self-pay

## 2018-12-07 ENCOUNTER — Ambulatory Visit (INDEPENDENT_AMBULATORY_CARE_PROVIDER_SITE_OTHER): Payer: 59 | Admitting: Bariatrics

## 2018-12-07 ENCOUNTER — Ambulatory Visit
Admission: RE | Admit: 2018-12-07 | Discharge: 2018-12-07 | Disposition: A | Discharge status: Home / Self Care | Payer: 59 | Source: Ambulatory Visit | Attending: Family Medicine | Admitting: Family Medicine

## 2018-12-07 ENCOUNTER — Encounter (INDEPENDENT_AMBULATORY_CARE_PROVIDER_SITE_OTHER): Payer: Self-pay | Admitting: Bariatrics

## 2018-12-07 VITALS — BP 145/84 | HR 86 | Temp 98.5°F | Ht 63.0 in | Wt 251.0 lb

## 2018-12-07 DIAGNOSIS — Z1331 Encounter for screening for depression: Secondary | ICD-10-CM | POA: Diagnosis not present

## 2018-12-07 DIAGNOSIS — Z0289 Encounter for other administrative examinations: Secondary | ICD-10-CM

## 2018-12-07 DIAGNOSIS — R5383 Other fatigue: Secondary | ICD-10-CM

## 2018-12-07 DIAGNOSIS — I10 Essential (primary) hypertension: Secondary | ICD-10-CM

## 2018-12-07 DIAGNOSIS — E559 Vitamin D deficiency, unspecified: Secondary | ICD-10-CM

## 2018-12-07 DIAGNOSIS — Z1231 Encounter for screening mammogram for malignant neoplasm of breast: Secondary | ICD-10-CM

## 2018-12-07 DIAGNOSIS — E7849 Other hyperlipidemia: Secondary | ICD-10-CM

## 2018-12-07 DIAGNOSIS — Z6841 Body Mass Index (BMI) 40.0 and over, adult: Secondary | ICD-10-CM

## 2018-12-07 DIAGNOSIS — R0602 Shortness of breath: Secondary | ICD-10-CM

## 2018-12-07 DIAGNOSIS — Z9189 Other specified personal risk factors, not elsewhere classified: Secondary | ICD-10-CM

## 2018-12-07 DIAGNOSIS — E119 Type 2 diabetes mellitus without complications: Secondary | ICD-10-CM

## 2018-12-07 DIAGNOSIS — G4733 Obstructive sleep apnea (adult) (pediatric): Secondary | ICD-10-CM

## 2018-12-07 DIAGNOSIS — Z794 Long term (current) use of insulin: Secondary | ICD-10-CM

## 2018-12-08 NOTE — Progress Notes (Signed)
Office: [phone]  /  Fax: [phone]   Dear Loletha Carrow L. Raenette Rover, NP-C,   Thank you for referring NILZA NAUMAN to our clinic. The following note includes my evaluation and treatment recommendations.  HPI:   Chief Complaint: OBESITY    NALAYA PETTAWAY has been referred by [name] L. Raenette Rover, NP-C for consultation regarding her obesity and obesity related comorbidities.    SAVEA FULD (MR# [id number]) is a 53 y.o. female who presents on 12/07/2018 for obesity evaluation and treatment. Current BMI is Body mass index is 44.46 kg/m. Xiana has been struggling with her weight for many years and has been unsuccessful in either losing weight, maintaining weight loss, or reaching her healthy weight goal.     Neftali does not like to cook and she states obstacles as too much monitoring and can be overwhelming. She considers herself a picky eater. She skips breakfast 2-3 times a week.     Joliet attended our information session and states she is currently in the action stage of change and ready to dedicate time achieving and maintaining a healthier weight. Jahmia is interested in becoming our patient and working on intensive lifestyle modifications including (but not limited to) diet, exercise and weight loss.    Dezerae states her family eats meals together she thinks her family will eat healthier with  her her desired weight loss is 81 lbs she started gaining weight after her first child her heaviest weight ever was 259 lbs she is a picky eater and doesn't like to eat healthier foods  she skips meals frequently she is frequently drinking liquids with calories she frequently eats larger portions than normal  she struggles with emotional eating    Fatigue Malana feels her energy is lower than it should be. This has worsened with weight gain and has not worsened recently. Andrell admits to daytime somnolence and  admits to waking up still tired. Patient has a history of obstructive sleep apnea. Patent  has a history of symptoms of daytime fatigue and morning headache. Patient generally gets 6 hours of sleep per night, and states they generally have generally restful sleep. Snoring is present. Apneic episodes are present. Epworth Sleepiness Score is 16.  Dyspnea on exertion Kyoko notes increasing shortness of breath with exercising and seems to be worsening over time with weight gain. She notes getting out of breath sooner with activity than she used to. This has not gotten worse recently. Delma denies orthopnea.  Hypertension TOCCARA WIGAL is a 53 y.o. female with hypertension. Coline is taking Norvasc, Lotensin, and metoprolol. She is working on weight loss to help control her blood pressure with the goal of decreasing her risk of heart attack and stroke.   Obstructive Sleep Apnea Teshauna has a diagnosis of obstructive sleep apnea. She wears CPAP (started 10/30/2018). She notes fatigued has improved.  Diabetes II Treniece has a diagnosis of diabetes type II. Terris is taking Farixga, Basaglar insulin, and metformin. Last A1c was 10.5. She denies hypoglycemia. She has been working on intensive lifestyle modifications including diet, exercise, and weight loss to help control her blood glucose levels.  Hyperlipidemia Taimane has hyperlipidemia and has been trying to improve her cholesterol levels with intensive lifestyle modification including a low saturated fat diet, exercise and weight loss. She is taking Zocor and denies any chest pain, claudication or myalgias.  Vitamin D Deficiency Shawnda has a diagnosis of vitamin D deficiency. She is currently taking OTC Vit D and denies nausea, vomiting or  muscle weakness.  At risk for osteopenia and osteoporosis Kendalynn is at higher risk of osteopenia and osteoporosis due to vitamin D deficiency.   Depression Screen Antoria's Food and Mood (modified PHQ-9) score was  Depression screen PHQ 2/9 12/07/2018  Decreased Interest 0  Down, Depressed, Hopeless 1  PHQ - 2  Score 1  Altered sleeping 0  Tired, decreased energy 1  Change in appetite 1  Feeling bad or failure about yourself  0  Trouble concentrating 1  Moving slowly or fidgety/restless 0  Suicidal thoughts 0  PHQ-9 Score 4  Difficult doing work/chores Not difficult at all    ASSESSMENT AND PLAN:  Other fatigue - Plan: EKG 12-Lead, T3, T4, free, TSH, Vitamin B12  SOB (shortness of breath) on exertion - Plan: T3, T4, free, TSH, Vitamin B12  Essential hypertension  OSA (obstructive sleep apnea)  Type 2 diabetes mellitus without complication, with long-term current use of insulin (HCC) - Plan: C-peptide  Other hyperlipidemia  Vitamin D deficiency - Plan: Vitamin B12, VITAMIN D 25 Hydroxy (Vit-D Deficiency, Fractures)  Depression screening  At risk for osteoporosis  Class 3 severe obesity with serious comorbidity and body mass index (BMI) of 40.0 to 44.9 in adult, unspecified obesity type (HCC)  PLAN:  Fatigue Jaelin was informed that her fatigue may be related to obesity, depression or many other causes. Labs will be ordered, and in the meanwhile Marney has agreed to work on diet, exercise and weight loss to help with fatigue. Proper sleep hygiene was discussed including the need for 7-8 hours of quality sleep each night. A sleep study was not ordered based on symptoms and Epworth score.  Dyspnea on exertion Kandra's shortness of breath appears to be obesity related and exercise induced. She has agreed to work on weight loss and gradually increase exercise to treat her exercise induced shortness of breath. If Toia follows our instructions and loses weight without improvement of her shortness of breath, we will plan to refer to pulmonology. We will monitor this condition regularly. Idellar agrees to this plan.  Hypertension We discussed sodium restriction, working on healthy weight loss, and a regular exercise program as the means to achieve improved blood pressure control. Tashaya agreed with  this plan and agreed to follow up as directed. We will continue to monitor her blood pressure as well as her progress with the above lifestyle modifications. Trinika will continue her medications as prescribed and will watch for signs of hypotension as she continues her lifestyle modifications. Maudry agrees to follow up with our clinic in 2 weeks.  Obstructive Sleep Apnea Graycen will continue the use of CPAP, and she agrees to follow up with our clinic in 2 weeks.  Diabetes II Atina has been given extensive diabetes education by myself today including ideal fasting and post-prandial blood glucose readings, individual ideal Hgb A1c goals and hypoglycemia prevention. We discussed the importance of good blood sugar control to decrease the likelihood of diabetic complications such as nephropathy, neuropathy, limb loss, blindness, coronary artery disease, and death. We discussed the importance of intensive lifestyle modification including diet, exercise and weight loss as the first line treatment for diabetes. Taneika agrees to continue her diabetes medications, she is to continue taking metformin and check fasting BGs and 2 hour post prandials; she is to decrease Basglar to 35 units). We will check Vit B12 level today. Myrissa agrees to follow up with our clinic in 2 weeks.  Hyperlipidemia Rendi was informed of the American Heart Association  Guidelines emphasizing intensive lifestyle modifications as the first line treatment for hyperlipidemia. We discussed many lifestyle modifications today in depth, and Kristeen will continue to work on decreasing saturated fats such as fatty red meat, butter and many fried foods. She will also increase vegetables and lean protein in her diet and continue to work on exercise and weight loss efforts. Jovana agrees to continue her medications, and she agrees to follow up with our clinic in 2 weeks.  Vitamin D Deficiency Zyon was informed that low vitamin D levels contributes to fatigue and are  associated with obesity, breast, and colon cancer. Briasia agrees to continue taking OTC Vit D and will follow up for routine testing of vitamin D, at least 2-3 times per year. She was informed of the risk of over-replacement of vitamin D and agrees to not increase her dose unless she discusses this with [place] first. We will check Vit D level today. Refugia agrees to follow up with our clinic in 2 weeks.  At risk for osteopenia and osteoporosis Deneka was given extended (15 minutes) osteoporosis prevention counseling today. Emra is at risk for osteopenia and osteoporsis due to her vitamin D deficiency. She was encouraged to take her vitamin D and follow her higher calcium diet and increase strengthening exercise to help strengthen her bones and decrease her risk of osteopenia and osteoporosis.  Depression Screen Metzli had a negative depression screening. Depression is commonly associated with obesity and often results in emotional eating behaviors. We will monitor this closely and work on CBT to help improve the non-hunger eating patterns. Referral to Psychology may be required if no improvement is seen as she continues in our clinic.  Obesity Yanaira is currently in the action stage of change and her goal is to continue with weight loss efforts. I recommend Semya begin the structured treatment plan as follows:  She has agreed to follow the Category 4 plan + 100 calories Category 3 breakfast (multiple variations were written out for the patient). Chantale has been instructed to eventually work up to a goal of 150 minutes of combined cardio and strengthening exercise per week for weight loss and overall health benefits. We discussed the following Behavioral Modification Strategies today: increasing lean protein intake, decreasing simple carbohydrates, increasing vegetables, decrease eating out, increase H20 intake, no skipping meals, work on meal planning and easy cooking plans, keeping healthy foods in the home, and  planning for success   She was informed of the importance of frequent follow up visits to maximize her success with intensive lifestyle modifications for her multiple health conditions. She was informed we would discuss her lab results at her next visit unless there is a critical issue that needs to be addressed sooner. Tiauna agreed to keep her next visit at the agreed upon time to discuss these results.  ALLERGIES: Allergies  Allergen Reactions   Naprosyn [Naproxen] Nausea Only    MEDICATIONS: Current Outpatient Medications on File Prior to Visit  Medication Sig Dispense Refill   amLODipine (NORVASC) 5 MG tablet TAKE 1 TABLET BY MOUTH EVERY DAY 90 tablet 0   B-D ULTRAFINE III SHORT PEN 31G X 8 MM MISC USE TO INJECT BASAGLAR DAILY 100 each 11   benazepril (LOTENSIN) 40 MG tablet TAKE 1 TABLET BY MOUTH EVERY DAY 90 tablet 0   cetirizine (ZYRTEC) 10 MG tablet Take 10 mg by mouth daily.     cholecalciferol (VITAMIN D) 1000 units tablet Take 2,000 Units by mouth at bedtime.  dapagliflozin propanediol (FARXIGA) 10 MG TABS tablet Take 10 mg by mouth daily before breakfast. 90 tablet 3   hydrochlorothiazide (HYDRODIURIL) 25 MG tablet Take 1 tablet (25 mg total) by mouth daily. 90 tablet 3   metFORMIN (GLUCOPHAGE) 500 MG tablet Take 1 tablet (500 mg total) by mouth daily with breakfast. 30 tablet 11   metoprolol tartrate (LOPRESSOR) 25 MG tablet Take 1 tablet (25 mg total) by mouth 2 (two) times daily. 60 tablet 4   Omega-3 Fatty Acids (FISH OIL PO) Take 1 capsule by mouth at bedtime. Reported on 08/08/2015     omeprazole (PRILOSEC) 40 MG capsule Take 1 capsule (40 mg total) by mouth daily. 30 capsule 3   ondansetron (ZOFRAN) 4 MG tablet Take 4 mg by mouth every 8 (eight) hours as needed for nausea or vomiting.     rivaroxaban (XARELTO) 20 MG TABS tablet Take 1 tablet (20 mg total) by mouth daily with supper. 30 tablet 4   simvastatin (ZOCOR) 20 MG tablet Take 20 mg by mouth  daily.     No current facility-administered medications on file prior to visit.     PAST MEDICAL HISTORY: Past Medical History:  Diagnosis Date   Acid reflux    Anemia    Arthritis    Asthma    Cervical dysplasia    Diabetes mellitus    DVT (deep venous thrombosis) (HCC)    Gallbladder problem    GERD (gastroesophageal reflux disease)    Heart murmur    Hx of blood clots    Hyperlipidemia    Hypertension    LBP (low back pain)    Liver problem    Obesity    OSA (obstructive sleep apnea) 03/22/2018   New diagnosis. Mild.    Pulmonary embolism (HCC)    Sarcoidosis    Sleep apnea    Vitamin D deficiency     PAST SURGICAL HISTORY: Past Surgical History:  Procedure Laterality Date   BREAST BIOPSY Left 2019   BUNIONECTOMY     CHOLECYSTECTOMY     COLPOSCOPY     GYNECOLOGIC CRYOSURGERY     KNEE ARTHROSCOPY     TUBAL LIGATION      SOCIAL HISTORY: Social History   Tobacco Use   Smoking status: Never Smoker   Smokeless tobacco: Never Used  Substance Use Topics   Alcohol use: No   Drug use: No    FAMILY HISTORY: Family History  Problem Relation Age of Onset   Heart failure Mother 30       chf   Diabetes Mother    High blood pressure Mother    Heart disease Mother    Sudden death Mother    Kidney disease Mother    Obesity Mother    Heart failure Father    Heart disease Father    Sudden death Father    Alcoholism Father    Cancer Brother 56       prostate   Hypertension Maternal Uncle    Hyperlipidemia Brother    Diabetes Brother    Colon cancer Maternal Grandfather    Colon polyps Brother    Diabetes Brother    Hyperlipidemia Brother    Stomach cancer Neg Hx    Esophageal cancer Neg Hx    Rectal cancer Neg Hx    Liver cancer Neg Hx    Breast cancer Neg Hx     ROS: Review of Systems  Constitutional: Positive for malaise/fatigue. Negative for weight loss.  HENT:  Positive for tinnitus.         + Dry mouth  Eyes:       + Wear glasses or contacts  Respiratory: Positive for shortness of breath (with exertion).   Cardiovascular: Negative for chest pain, orthopnea and claudication.  Gastrointestinal: Positive for constipation. Negative for nausea and vomiting.  Genitourinary: Positive for frequency.  Musculoskeletal: Negative for myalgias.       Negative muscle weakness  Skin:       + Dryness  Neurological: Positive for weakness.  Endo/Heme/Allergies:       Negative hypoglycemia    PHYSICAL EXAM: Blood pressure (!) 145/84, pulse 86, temperature 98.5 F (36.9 C), temperature source Oral, height 5\' 3"  (1.6 m), weight 251 lb (113.9 kg), last menstrual period 07/27/2018, SpO2 98 %. Body mass index is 44.46 kg/m. Physical Exam Vitals signs reviewed.  Constitutional:      Appearance: Normal appearance. She is obese.  HENT:     Head: Normocephalic and atraumatic.     Nose: Nose normal.  Eyes:     General: No scleral icterus.    Extraocular Movements: Extraocular movements intact.  Neck:     Musculoskeletal: Normal range of motion and neck supple.     Comments: No thyromegaly present Cardiovascular:     Rate and Rhythm: Normal rate and regular rhythm.     Pulses: Normal pulses.     Heart sounds: Normal heart sounds.  Pulmonary:     Effort: Pulmonary effort is normal. No respiratory distress.     Breath sounds: Normal breath sounds.  Abdominal:     Palpations: Abdomen is soft.     Tenderness: There is no abdominal tenderness.     Comments: + Obesity  Musculoskeletal: Normal range of motion.     Right lower leg: No edema.     Left lower leg: No edema.  Skin:    General: Skin is warm and dry.  Neurological:     Mental Status: She is alert and oriented to person, place, and time.     Coordination: Coordination normal.  Psychiatric:        Mood and Affect: Mood normal.        Behavior: Behavior normal.     RECENT LABS AND TESTS: BMET    Component Value  Date/Time   NA 139 09/15/2018 0845   K 4.6 09/15/2018 0845   CL 97 09/15/2018 0845   CO2 24 09/15/2018 0845   GLUCOSE 208 (H) 09/15/2018 0845   GLUCOSE 180 (H) 08/18/2018 0310   BUN 15 09/15/2018 0845   CREATININE 1.02 (H) 09/15/2018 0845   CREATININE 0.66 12/04/2016 0831   CALCIUM 10.0 09/15/2018 0845   GFRNONAA 62 09/15/2018 0845   GFRNONAA 101 12/04/2016 0831   GFRAA 72 09/15/2018 0845   GFRAA 117 12/04/2016 0831   Lab Results  Component Value Date   HGBA1C 10.5 (A) 11/04/2018   No results found for: INSULIN CBC    Component Value Date/Time   WBC 6.9 08/26/2018 1232   WBC 11.5 (H) 08/18/2018 0310   RBC 5.39 (H) 08/26/2018 1232   RBC 4.91 08/18/2018 0310   HGB 11.9 08/26/2018 1232   HCT 39.2 08/26/2018 1232   PLT 327 08/26/2018 1232   MCV 73 (L) 08/26/2018 1232   MCH 22.1 (L) 08/26/2018 1232   MCH 22.6 (L) 08/18/2018 0310   MCHC 30.4 (L) 08/26/2018 1232   MCHC 30.7 08/18/2018 0310   RDW 15.8 (H) 08/26/2018 1232   LYMPHSABS 2.6  08/26/2018 1232   MONOABS 0.5 08/15/2018 1400   EOSABS 0.1 08/26/2018 1232   BASOSABS 0.1 08/26/2018 1232   Iron/TIBC/Ferritin/ %Sat    Component Value Date/Time   IRON 66 08/08/2015 0743   TIBC 420 08/08/2015 0743   IRONPCTSAT 16 08/08/2015 0743   Lipid Panel     Component Value Date/Time   CHOL 142 09/15/2018 0845   TRIG 117 09/15/2018 0845   HDL 50 09/15/2018 0845   CHOLHDL 2.8 09/15/2018 0845   CHOLHDL 3.5 12/04/2016 0831   VLDL 27 01/23/2016 0937   LDLCALC 69 09/15/2018 0845   LDLCALC 121 (H) 12/04/2016 0831   Hepatic Function Panel     Component Value Date/Time   PROT 7.5 09/15/2018 0845   ALBUMIN 4.2 09/15/2018 0845   AST 25 09/15/2018 0845   ALT 26 09/15/2018 0845   ALKPHOS 135 (H) 09/15/2018 0845   BILITOT <0.2 09/15/2018 0845   BILIDIR 0.08 09/15/2018 0845      Component Value Date/Time   TSH 0.955 08/15/2018 1432   TSH 1.430 02/13/2018 1425   TSH 0.843 05/08/2017 0909   TSH 1.27 04/19/2015 0001    ECG   shows NSR with a rate of 90 BPM INDIRECT CALORIMETER done today shows a VO2 of 317 and a REE of 2207.  Her calculated basal metabolic rate is [id number] thus her basal metabolic rate is better than expected.       OBESITY BEHAVIORAL INTERVENTION VISIT  Today's visit was # 1   Starting weight: 251 lbs Starting date: 12/07/2018 Today's weight : 251 lbs Today's date: 12/07/2018 Total lbs lost to date: 0    ASK: We discussed the diagnosis of obesity with Audery Amel today and Melene Muller agreed to give [place] permission to discuss obesity behavioral modification therapy today.  ASSESS: Amour has the diagnosis of obesity and her BMI today is 44.47 Anapaula is in the action stage of change   ADVISE: Dardanella was educated on the multiple health risks of obesity as well as the benefit of weight loss to improve her health. She was advised of the need for long term treatment and the importance of lifestyle modifications to improve her current health and to decrease her risk of future health problems.  AGREE: Multiple dietary modification options and treatment options were discussed and  Regana agreed to follow the recommendations documented in the above note.  ARRANGE: Delana was educated on the importance of frequent visits to treat obesity as outlined per CMS and USPSTF guidelines and agreed to schedule her next follow up appointment today.  Wilhemena Durie, am acting as transcriptionist for [employer], DO  I have reviewed the above documentation for accuracy and completeness, and I agree with the above. -Jearld Lesch, DO

## 2018-12-09 ENCOUNTER — Other Ambulatory Visit: Payer: Self-pay | Admitting: Family Medicine

## 2018-12-09 DIAGNOSIS — K219 Gastro-esophageal reflux disease without esophagitis: Secondary | ICD-10-CM

## 2018-12-14 ENCOUNTER — Other Ambulatory Visit: Payer: Self-pay | Admitting: Family Medicine

## 2018-12-14 DIAGNOSIS — I10 Essential (primary) hypertension: Secondary | ICD-10-CM

## 2018-12-19 LAB — TSH: TSH: 1.43 u[IU]/mL (ref 0.450–4.500)

## 2018-12-19 LAB — VITAMIN D 25 HYDROXY (VIT D DEFICIENCY, FRACTURES): Vit D, 25-Hydroxy: 34.9 ng/mL (ref 30.0–100.0)

## 2018-12-19 LAB — T3: T3, Total: 106 ng/dL (ref 71–180)

## 2018-12-19 LAB — C-PEPTIDE: C-Peptide: 2.3 ng/mL (ref 1.1–4.4)

## 2018-12-19 LAB — T4, FREE: Free T4: 1.1 ng/dL (ref 0.82–1.77)

## 2018-12-19 LAB — VITAMIN B12: Vitamin B-12: 767 pg/mL (ref 232–1245)

## 2018-12-21 ENCOUNTER — Other Ambulatory Visit: Payer: Self-pay

## 2018-12-21 ENCOUNTER — Ambulatory Visit (INDEPENDENT_AMBULATORY_CARE_PROVIDER_SITE_OTHER): Payer: 59 | Admitting: Bariatrics

## 2018-12-21 ENCOUNTER — Encounter (INDEPENDENT_AMBULATORY_CARE_PROVIDER_SITE_OTHER): Payer: Self-pay | Admitting: Bariatrics

## 2018-12-21 VITALS — BP 125/80 | HR 78 | Temp 98.3°F | Ht 63.0 in | Wt 251.0 lb

## 2018-12-21 DIAGNOSIS — Z9189 Other specified personal risk factors, not elsewhere classified: Secondary | ICD-10-CM

## 2018-12-21 DIAGNOSIS — E559 Vitamin D deficiency, unspecified: Secondary | ICD-10-CM

## 2018-12-21 DIAGNOSIS — Z6841 Body Mass Index (BMI) 40.0 and over, adult: Secondary | ICD-10-CM

## 2018-12-21 DIAGNOSIS — E119 Type 2 diabetes mellitus without complications: Secondary | ICD-10-CM

## 2018-12-21 DIAGNOSIS — Z794 Long term (current) use of insulin: Secondary | ICD-10-CM

## 2018-12-21 MED ORDER — VITAMIN D (ERGOCALCIFEROL) 1.25 MG (50000 UNIT) PO CAPS: 50000.0000 [IU] | ORAL_CAPSULE | ORAL | 0 refills | Status: DC

## 2018-12-21 NOTE — Progress Notes (Signed)
Office: [phone]  /  Fax: [phone]   HPI:   Chief Complaint: OBESITY Victoria Padilla is here to discuss her progress with her obesity treatment plan. She is on the Category 4 plan and is following her eating plan approximately 35% of the time. She states she is doing yard work 2-3 hours 3 times per week. [name] is already struggling with the meal plan. Her weight has remained the same. She reports drinking more water. Her weight is 251 lb (113.9 kg) today and has not lost weight since her last visit. She has lost 0 lbs since starting treatment with [place].  Vitamin D deficiency Victoria Padilla has a diagnosis of Vitamin D deficiency. Last Vitamin D 34.9 on 12/15/2018. She is currently taking OTC Vit D and denies nausea, vomiting or muscle weakness.  At risk for osteopenia and osteoporosis Victoria Padilla is at higher risk of osteopenia and osteoporosis due to Vitamin D deficiency.   Diabetes II [name] has a diagnosis of diabetes type II and is taking metformin, Farxiga, and  Engineer, agricultural. Victoria Padilla states fasting blood sugars range between 110 and 130's with a low of 79 one time; 2-hour postprandial highest reading 216. Last A1c was 10.5 on 11/04/2018. She has been working on intensive lifestyle modifications including diet, exercise, and weight loss to help control her blood glucose levels.  ASSESSMENT AND PLAN:  Vitamin D deficiency - Plan: Vitamin D, Ergocalciferol, (DRISDOL) 1.25 MG (50000 UT) CAPS capsule  Type 2 diabetes mellitus without complication, with long-term current use of insulin (HCC)  At risk for osteoporosis  Class 3 severe obesity with serious comorbidity and body mass index (BMI) of 40.0 to 44.9 in adult, unspecified obesity type ([place])  PLAN:  Vitamin D Deficiency [name] was informed that low Vitamin D levels contributes to fatigue and are associated with obesity, breast, and colon cancer. She agrees to continue to take prescription Vit D @ 50,000 IU every week #4 with 0 refills and will follow-up for  routine testing of Vitamin D, at least 2-3 times per year. She was informed of the risk of over-replacement of Vitamin D and agrees to not increase her dose unless she discusses this with [place] first. [name] agrees to follow-up with our clinic in 2 weeks.  At risk for osteopenia and osteoporosis Victoria Padilla was given extended  (15 minutes) osteoporosis prevention counseling today. Victoria Padilla is at risk for osteopenia and osteoporosis due to her Vitamin D deficiency. She was encouraged to take her Vitamin D and follow her higher calcium diet and increase strengthening exercise to help strengthen her bones and decrease her risk of osteopenia and osteoporosis.  Diabetes II Victoria Padilla has been given extensive diabetes education by myself today including ideal fasting and post-prandial blood glucose readings, individual ideal HgA1c goals  and hypoglycemia prevention. We discussed the importance of good blood sugar control to decrease the likelihood of diabetic complications such as nephropathy, neuropathy, limb loss, blindness, coronary artery disease, and death. We discussed the importance of intensive lifestyle modification including diet, exercise and weight loss as the first line treatment for diabetes. Victoria Padilla agrees to continue her diabetes medications and will follow-up at the agreed upon time.  Obesity Victoria Padilla is currently in the action stage of change. As such, her goal is to continue with weight loss efforts. She has agreed to follow the Category 4 plan. Victoria Padilla will work on meal planning, intentional eating, and will adhere more closely to the plan. Victoria Padilla has been instructed to work up to a goal of 150 minutes  of combined cardio and strengthening exercise per week for weight loss and overall health benefits. We discussed the following Behavioral Modification Strategies today: increasing lean protein intake, decreasing simple carbohydrates, increasing vegetables, increase H20 intake, decrease eating out, no skipping meals, work  on meal planning and easy cooking plans, keeping healthy foods in the home, and planning for success.  Victoria Padilla has agreed to follow-up with our clinic in 2 weeks. She was informed of the importance of frequent follow-up visits to maximize her success with intensive lifestyle modifications for her multiple health conditions.  ALLERGIES: Allergies  Allergen Reactions  . Naprosyn [Naproxen] Nausea Only    MEDICATIONS: Current Outpatient Medications on File Prior to Visit  Medication Sig Dispense Refill  . amLODipine (NORVASC) 5 MG tablet TAKE 1 TABLET BY MOUTH EVERY DAY 90 tablet 0  . B-D ULTRAFINE III SHORT PEN 31G X 8 MM MISC USE TO INJECT BASAGLAR DAILY 100 each 11  . benazepril (LOTENSIN) 40 MG tablet TAKE 1 TABLET BY MOUTH EVERY DAY 90 tablet 0  . cetirizine (ZYRTEC) 10 MG tablet Take 10 mg by mouth daily.    . cholecalciferol (VITAMIN D) 1000 units tablet Take 2,000 Units by mouth at bedtime.     . dapagliflozin propanediol (FARXIGA) 10 MG TABS tablet Take 10 mg by mouth daily before breakfast. 90 tablet 3  . hydrochlorothiazide (HYDRODIURIL) 25 MG tablet Take 1 tablet (25 mg total) by mouth daily. 90 tablet 3  . metFORMIN (GLUCOPHAGE) 500 MG tablet Take 1 tablet (500 mg total) by mouth daily with breakfast. 30 tablet 11  . metoprolol tartrate (LOPRESSOR) 25 MG tablet Take 1 tablet (25 mg total) by mouth 2 (two) times daily. 60 tablet 4  . Omega-3 Fatty Acids (FISH OIL PO) Take 1 capsule by mouth at bedtime. Reported on 08/08/2015    . omeprazole (PRILOSEC) 40 MG capsule TAKE 1 CAPSULE BY MOUTH EVERY DAY 90 capsule 0  . ondansetron (ZOFRAN) 4 MG tablet Take 4 mg by mouth every 8 (eight) hours as needed for nausea or vomiting.    . rivaroxaban (XARELTO) 20 MG TABS tablet Take 1 tablet (20 mg total) by mouth daily with supper. 30 tablet 4  . simvastatin (ZOCOR) 20 MG tablet Take 20 mg by mouth daily.     No current facility-administered medications on file prior to visit.     PAST MEDICAL  HISTORY: Past Medical History:  Diagnosis Date  . Acid reflux   . Anemia   . Arthritis   . Asthma   . Cervical dysplasia   . Diabetes mellitus   . DVT (deep venous thrombosis) ([place])   . Gallbladder problem   . GERD (gastroesophageal reflux disease)   . Heart murmur   . Hx of blood clots   . Hyperlipidemia   . Hypertension   . LBP (low back pain)   . Liver problem   . Obesity   . OSA (obstructive sleep apnea) 03/22/2018   New diagnosis. Mild.   . Pulmonary embolism ([place])   . Sarcoidosis   . Sleep apnea   . Vitamin D deficiency     PAST SURGICAL HISTORY: Past Surgical History:  Procedure Laterality Date  . BREAST BIOPSY Left 2019  . BUNIONECTOMY    . CHOLECYSTECTOMY    . COLPOSCOPY    . GYNECOLOGIC CRYOSURGERY    . KNEE ARTHROSCOPY    . TUBAL LIGATION      SOCIAL HISTORY: Social History   Tobacco Use  . Smoking  status: Never Smoker  . Smokeless tobacco: Never Used  Substance Use Topics  . Alcohol use: No  . Drug use: No    FAMILY HISTORY: Family History  Problem Relation Age of Onset  . Heart failure Mother 49       chf  . Diabetes Mother   . High blood pressure Mother   . Heart disease Mother   . Sudden death Mother   . Kidney disease Mother   . Obesity Mother   . Heart failure Father   . Heart disease Father   . Sudden death Father   . Alcoholism Father   . Cancer Brother 5       prostate  . Hypertension Maternal Uncle   . Hyperlipidemia Brother   . Diabetes Brother   . Colon cancer Maternal Grandfather   . Colon polyps Brother   . Diabetes Brother   . Hyperlipidemia Brother   . Stomach cancer Neg Hx   . Esophageal cancer Neg Hx   . Rectal cancer Neg Hx   . Liver cancer Neg Hx   . Breast cancer Neg Hx    ROS: Review of Systems  Gastrointestinal: Negative for nausea and vomiting.  Musculoskeletal:       Negative for muscle weakness.   PHYSICAL EXAM: Blood pressure 125/80, pulse 78, temperature 98.3 F (36.8 C), height 5\' 3"  (1.6  m), weight 251 lb (113.9 kg), SpO2 96 %. Body mass index is 44.46 kg/m. Physical Exam Vitals signs reviewed.  Constitutional:      Appearance: Normal appearance. She is obese.  Cardiovascular:     Rate and Rhythm: Normal rate.     Pulses: Normal pulses.  Pulmonary:     Effort: Pulmonary effort is normal.     Breath sounds: Normal breath sounds.  Musculoskeletal: Normal range of motion.  Skin:    General: Skin is warm and dry.  Neurological:     Mental Status: She is alert and oriented to person, place, and time.  Psychiatric:        Behavior: Behavior normal.   RECENT LABS AND TESTS: BMET    Component Value Date/Time   NA 139 09/15/2018 0845   K 4.6 09/15/2018 0845   CL 97 09/15/2018 0845   CO2 24 09/15/2018 0845   GLUCOSE 208 (H) 09/15/2018 0845   GLUCOSE 180 (H) 08/18/2018 0310   BUN 15 09/15/2018 0845   CREATININE 1.02 (H) 09/15/2018 0845   CREATININE 0.66 12/04/2016 0831   CALCIUM 10.0 09/15/2018 0845   GFRNONAA 62 09/15/2018 0845   GFRNONAA 101 12/04/2016 0831   GFRAA 72 09/15/2018 0845   GFRAA 117 12/04/2016 0831   Lab Results  Component Value Date   HGBA1C 10.5 (A) 11/04/2018   HGBA1C 11.1 (H) 08/18/2018   HGBA1C 10.5 (A) 03/11/2018   HGBA1C 8.4 (A) 01/08/2018   HGBA1C 10.0 07/09/2017   No results found for: INSULIN CBC    Component Value Date/Time   WBC 6.9 08/26/2018 1232   WBC 11.5 (H) 08/18/2018 0310   RBC 5.39 (H) 08/26/2018 1232   RBC 4.91 08/18/2018 0310   HGB 11.9 08/26/2018 1232   HCT 39.2 08/26/2018 1232   PLT 327 08/26/2018 1232   MCV 73 (L) 08/26/2018 1232   MCH 22.1 (L) 08/26/2018 1232   MCH 22.6 (L) 08/18/2018 0310   MCHC 30.4 (L) 08/26/2018 1232   MCHC 30.7 08/18/2018 0310   RDW 15.8 (H) 08/26/2018 1232   LYMPHSABS 2.6 08/26/2018 1232   MONOABS  0.5 08/15/2018 1400   EOSABS 0.1 08/26/2018 1232   BASOSABS 0.1 08/26/2018 1232   Iron/TIBC/Ferritin/ %Sat    Component Value Date/Time   IRON 66 08/08/2015 0743   TIBC 420  08/08/2015 0743   IRONPCTSAT 16 08/08/2015 0743   Lipid Panel     Component Value Date/Time   CHOL 142 09/15/2018 0845   TRIG 117 09/15/2018 0845   HDL 50 09/15/2018 0845   CHOLHDL 2.8 09/15/2018 0845   CHOLHDL 3.5 12/04/2016 0831   VLDL 27 01/23/2016 0937   LDLCALC 69 09/15/2018 0845   LDLCALC 121 (H) 12/04/2016 0831   Hepatic Function Panel     Component Value Date/Time   PROT 7.5 09/15/2018 0845   ALBUMIN 4.2 09/15/2018 0845   AST 25 09/15/2018 0845   ALT 26 09/15/2018 0845   ALKPHOS 135 (H) 09/15/2018 0845   BILITOT <0.2 09/15/2018 0845   BILIDIR 0.08 09/15/2018 0845      Component Value Date/Time   TSH 1.430 12/15/2018 0855   TSH 0.955 08/15/2018 1432   TSH 1.430 02/13/2018 1425   TSH 0.843 05/08/2017 0909   Results for TATTIANA, REYNOSA (MRN [id number]) as of 12/21/2018 16:10  Ref. Range 12/15/2018 08:55  Vitamin D, 25-Hydroxy Latest Ref Range: 30.0 - 100.0 ng/mL 34.9   OBESITY BEHAVIORAL INTERVENTION VISIT  Today's visit was #2  Starting weight: 251 lbs Starting date: 12/07/2018 Today's weight: 251 lbs Today's date: 12/21/2018 Total lbs lost to date: 0    12/21/2018  Height 5\' 3"  (1.6 m)  Weight 251 lb (113.9 kg)  BMI (Calculated) 44.47  BLOOD PRESSURE - SYSTOLIC 0000000  BLOOD PRESSURE - DIASTOLIC 80   Body Fat % [id number] %   ASK: We discussed the diagnosis of obesity with Audery Amel today and Melene Muller agreed to give [place] permission to discuss obesity behavioral modification therapy today.  ASSESS: Amariyanna has the diagnosis of obesity and her BMI today is 44.5. Brenetta is in the action stage of change.   ADVISE: Chardonae was educated on the multiple health risks of obesity as well as the benefit of weight loss to improve her health. She was advised of the need for long term treatment and the importance of lifestyle modifications to improve her current health and to decrease her risk of future health problems.  AGREE: Multiple dietary modification options and  treatment options were discussed and  Daisy agreed to follow the recommendations documented in the above note.  ARRANGE: Dionte was educated on the importance of frequent visits to treat obesity as outlined per CMS and USPSTF guidelines and agreed to schedule her next follow up appointment today.  Migdalia Dk, am acting as Location manager for [employer], DO  I have reviewed the above documentation for accuracy and completeness, and I agree with the above. -Jearld Lesch, DO

## 2018-12-28 ENCOUNTER — Ambulatory Visit (HOSPITAL_COMMUNITY)
Admission: RE | Admit: 2018-12-28 | Discharge: 2018-12-28 | Disposition: A | Discharge status: Home / Self Care | Payer: 59 | Source: Ambulatory Visit | Attending: Hematology | Admitting: Hematology

## 2018-12-28 ENCOUNTER — Other Ambulatory Visit: Payer: Self-pay

## 2018-12-28 DIAGNOSIS — I2609 Other pulmonary embolism with acute cor pulmonale: Secondary | ICD-10-CM | POA: Diagnosis not present

## 2018-12-28 DIAGNOSIS — I82413 Acute embolism and thrombosis of femoral vein, bilateral: Secondary | ICD-10-CM | POA: Diagnosis not present

## 2018-12-28 NOTE — Progress Notes (Signed)
Bilateral lower extremity venous duplex has been completed. Preliminary results can be found in CV Proc through chart review.  Results were given to [facility] at Dr. Grier Mitts office.  12/28/18 11:39 AM Victoria Padilla RVT

## 2018-12-30 ENCOUNTER — Encounter: Payer: Self-pay | Admitting: Endocrinology

## 2018-12-30 ENCOUNTER — Ambulatory Visit (INDEPENDENT_AMBULATORY_CARE_PROVIDER_SITE_OTHER): Payer: 59 | Admitting: Endocrinology

## 2018-12-30 VITALS — BP 134/82 | HR 79 | Ht 63.0 in | Wt 256.2 lb

## 2018-12-30 DIAGNOSIS — E11319 Type 2 diabetes mellitus with unspecified diabetic retinopathy without macular edema: Secondary | ICD-10-CM

## 2018-12-30 DIAGNOSIS — E119 Type 2 diabetes mellitus without complications: Secondary | ICD-10-CM

## 2018-12-30 DIAGNOSIS — E1165 Type 2 diabetes mellitus with hyperglycemia: Secondary | ICD-10-CM | POA: Diagnosis not present

## 2018-12-30 DIAGNOSIS — R609 Edema, unspecified: Secondary | ICD-10-CM | POA: Diagnosis not present

## 2018-12-30 DIAGNOSIS — IMO0002 Reserved for concepts with insufficient information to code with codable children: Secondary | ICD-10-CM

## 2018-12-30 LAB — POCT GLYCOSYLATED HEMOGLOBIN (HGB A1C): Hemoglobin A1C: 9.5 % — AB (ref 4.0–5.6)

## 2018-12-30 MED ORDER — FREESTYLE LITE TEST VI STRP: 1.0000 | ORAL_STRIP | Freq: Four times a day (QID) | 3 refills | Status: DC

## 2018-12-30 NOTE — Patient Instructions (Addendum)
check your blood sugar 4 times a day: before the 3 meals, and at bedtime.  also check if you have symptoms of your blood sugar being too high or too low.  please keep a record of the readings and bring it to your next appointment here (or you can bring the meter itself).  You can write it on any piece of paper.  please call us sooner if your blood sugar goes below 70, or if you have a lot of readings over 200. Please continue the same medications.   Please come back for a follow-up appointment in 2 months.

## 2018-12-30 NOTE — Progress Notes (Signed)
Subjective:    Patient ID: Victoria Padilla, female    DOB: 1966/01/07, 53 y.o.   MRN: [id number]  HPI Pt returns for f/u of diabetes mellitus: DM type: Insulin-requiring type 2 Dx'ed: [id number] Complications: DR Therapy: insulin since 2019, and 2 oral meds.   GDM: 1998, but DM persisted after the pregnancy.   DKA: never Severe hypoglycemia: never Pancreatitis: never Pancreatic imaging: normal on 2017 [place].  Other: she declined multiple daily injections.  she took trulicity for a few months in 2016; chronic abd pain and nausea have limited rx options.  she has thyroid pseudonodule.   Interval history: no cbg record, but states cbg varies from 122-240.  Pt says she checks QID.  It is in general higher as the day goes on.  pt states she feels well in general.  Pt says she never misses meds.  Past Medical History:  Diagnosis Date  . Acid reflux   . Anemia   . Arthritis   . Asthma   . Cervical dysplasia   . Diabetes mellitus   . DVT (deep venous thrombosis) ([place])   . Gallbladder problem   . GERD (gastroesophageal reflux disease)   . Heart murmur   . Hx of blood clots   . Hyperlipidemia   . Hypertension   . LBP (low back pain)   . Liver problem   . Obesity   . OSA (obstructive sleep apnea) 03/22/2018   New diagnosis. Mild.   . Pulmonary embolism ([place])   . Sarcoidosis   . Sleep apnea   . Vitamin D deficiency     Past Surgical History:  Procedure Laterality Date  . BREAST BIOPSY Left 2019  . BUNIONECTOMY    . CHOLECYSTECTOMY    . COLPOSCOPY    . GYNECOLOGIC CRYOSURGERY    . KNEE ARTHROSCOPY    . TUBAL LIGATION      Social History   Socioeconomic History  . Marital status: Single    Spouse name: Not on file  . Number of children: 3  . Years of education: Not on file  . Highest education level: Not on file  Occupational History  . Occupation: Careers adviser: [employer]  . Financial resource strain: Not on file  . Food insecurity     Worry: Not on file    Inability: Not on file  . Transportation needs    Medical: Not on file    Non-medical: Not on file  Tobacco Use  . Smoking status: Never Smoker  . Smokeless tobacco: Never Used  Substance and Sexual Activity  . Alcohol use: No  . Drug use: No  . Sexual activity: Not Currently    Birth control/protection: Surgical  Lifestyle  . Physical activity    Days per week: Not on file    Minutes per session: Not on file  . Stress: Not on file  Relationships  . Social Herbalist on phone: Not on file    Gets together: Not on file    Attends religious service: Not on file    Active member of club or organization: Not on file    Attends meetings of clubs or organizations: Not on file    Relationship status: Not on file  . Intimate partner violence    Fear of current or ex partner: Not on file    Emotionally abused: Not on file    Physically abused: Not on file  Forced sexual activity: Not on file  Other Topics Concern  . Not on file  Social History Narrative  . Not on file    Current Outpatient Medications on File Prior to Visit  Medication Sig Dispense Refill  . amLODipine (NORVASC) 5 MG tablet TAKE 1 TABLET BY MOUTH EVERY DAY 90 tablet 0  . B-D ULTRAFINE III SHORT PEN 31G X 8 MM MISC USE TO INJECT BASAGLAR DAILY 100 each 11  . benazepril (LOTENSIN) 40 MG tablet TAKE 1 TABLET BY MOUTH EVERY DAY 90 tablet 0  . cetirizine (ZYRTEC) 10 MG tablet Take 10 mg by mouth daily.    . cholecalciferol (VITAMIN D) 1000 units tablet Take 2,000 Units by mouth at bedtime.     . dapagliflozin propanediol (FARXIGA) 10 MG TABS tablet Take 10 mg by mouth daily before breakfast. 90 tablet 3  . hydrochlorothiazide (HYDRODIURIL) 25 MG tablet Take 1 tablet (25 mg total) by mouth daily. 90 tablet 3  . metFORMIN (GLUCOPHAGE) 500 MG tablet Take 1 tablet (500 mg total) by mouth daily with breakfast. 30 tablet 11  . metoprolol tartrate (LOPRESSOR) 25 MG tablet Take 1 tablet  (25 mg total) by mouth 2 (two) times daily. 60 tablet 4  . Omega-3 Fatty Acids (FISH OIL PO) Take 1 capsule by mouth at bedtime. Reported on 08/08/2015    . omeprazole (PRILOSEC) 40 MG capsule TAKE 1 CAPSULE BY MOUTH EVERY DAY 90 capsule 0  . ondansetron (ZOFRAN) 4 MG tablet Take 4 mg by mouth every 8 (eight) hours as needed for nausea or vomiting.    . rivaroxaban (XARELTO) 20 MG TABS tablet Take 1 tablet (20 mg total) by mouth daily with supper. 30 tablet 4  . simvastatin (ZOCOR) 20 MG tablet Take 20 mg by mouth daily.    . Vitamin D, Ergocalciferol, (DRISDOL) 1.25 MG (50000 UT) CAPS capsule Take 1 capsule (50,000 Units total) by mouth every 7 (seven) days. 4 capsule 0   No current facility-administered medications on file prior to visit.     Allergies  Allergen Reactions  . Naprosyn [Naproxen] Nausea Only    Family History  Problem Relation Age of Onset  . Heart failure Mother 75       chf  . Diabetes Mother   . High blood pressure Mother   . Heart disease Mother   . Sudden death Mother   . Kidney disease Mother   . Obesity Mother   . Heart failure Father   . Heart disease Father   . Sudden death Father   . Alcoholism Father   . Cancer Brother 53       prostate  . Hypertension Maternal Uncle   . Hyperlipidemia Brother   . Diabetes Brother   . Colon cancer Maternal Grandfather   . Colon polyps Brother   . Diabetes Brother   . Hyperlipidemia Brother   . Stomach cancer Neg Hx   . Esophageal cancer Neg Hx   . Rectal cancer Neg Hx   . Liver cancer Neg Hx   . Breast cancer Neg Hx     BP 134/82 (BP Location: Right Arm, Patient Position: Sitting, Cuff Size: Large)   Pulse 79   Ht 5\' 3"  (1.6 m)   Wt 256 lb 3.2 oz (116.2 kg)   SpO2 98%   BMI 45.38 kg/m    Review of Systems She denies hypoglycemia    Objective:   Physical Exam VITAL SIGNS:  See vs page GENERAL: no distress Pulses:  dorsalis pedis intact bilat.   MSK: no deformity of the feet.   CV: 1+ bilat leg  edema.   Skin:  no ulcer on the feet.  normal color and temp on the feet.  Old healed surgical scars on both feet.   Neuro: sensation is intact to touch on the feet  Lab Results  Component Value Date   HGBA1C 9.5 (A) 12/30/2018       Assessment & Plan:  Insulin-requiring type 2 DM, with DR: she needs increased rx.  We discussed risks of elev A1c.  She declines to increase insulin Edema: This limits rx options  Patient Instructions  check your blood sugar 4 times a day: before the 3 meals, and at bedtime.  also check if you have symptoms of your blood sugar being too high or too low.  please keep a record of the readings and bring it to your next appointment here (or you can bring the meter itself).  You can write it on any piece of paper.  please call us sooner if your blood sugar goes below 70, or if you have a lot of readings over 200. Please continue the same medications.   Please come back for a follow-up appointment in 2 months.

## 2019-01-11 ENCOUNTER — Inpatient Hospital Stay: Payer: 59 | Attending: Hematology

## 2019-01-11 ENCOUNTER — Other Ambulatory Visit: Payer: Self-pay

## 2019-01-11 ENCOUNTER — Inpatient Hospital Stay (HOSPITAL_BASED_OUTPATIENT_CLINIC_OR_DEPARTMENT_OTHER): Payer: 59 | Admitting: Hematology

## 2019-01-11 ENCOUNTER — Telehealth: Payer: Self-pay | Admitting: Hematology

## 2019-01-11 VITALS — BP 135/83 | HR 68 | Temp 98.5°F | Resp 18 | Ht 63.0 in | Wt 252.7 lb

## 2019-01-11 DIAGNOSIS — I2609 Other pulmonary embolism with acute cor pulmonale: Secondary | ICD-10-CM | POA: Diagnosis not present

## 2019-01-11 DIAGNOSIS — I82413 Acute embolism and thrombosis of femoral vein, bilateral: Secondary | ICD-10-CM

## 2019-01-11 DIAGNOSIS — D869 Sarcoidosis, unspecified: Secondary | ICD-10-CM | POA: Diagnosis not present

## 2019-01-11 DIAGNOSIS — E119 Type 2 diabetes mellitus without complications: Secondary | ICD-10-CM | POA: Insufficient documentation

## 2019-01-11 DIAGNOSIS — Z86718 Personal history of other venous thrombosis and embolism: Secondary | ICD-10-CM | POA: Diagnosis not present

## 2019-01-11 DIAGNOSIS — E669 Obesity, unspecified: Secondary | ICD-10-CM | POA: Insufficient documentation

## 2019-01-11 DIAGNOSIS — I10 Essential (primary) hypertension: Secondary | ICD-10-CM | POA: Insufficient documentation

## 2019-01-11 DIAGNOSIS — Z79899 Other long term (current) drug therapy: Secondary | ICD-10-CM | POA: Insufficient documentation

## 2019-01-11 DIAGNOSIS — Z800 Family history of malignant neoplasm of digestive organs: Secondary | ICD-10-CM | POA: Diagnosis not present

## 2019-01-11 DIAGNOSIS — R531 Weakness: Secondary | ICD-10-CM | POA: Insufficient documentation

## 2019-01-11 DIAGNOSIS — E785 Hyperlipidemia, unspecified: Secondary | ICD-10-CM | POA: Insufficient documentation

## 2019-01-11 DIAGNOSIS — I2699 Other pulmonary embolism without acute cor pulmonale: Secondary | ICD-10-CM | POA: Diagnosis not present

## 2019-01-11 LAB — CMP (CANCER CENTER ONLY)
ALT: 41 U/L (ref 0–44)
AST: 34 U/L (ref 15–41)
Albumin: 3.9 g/dL (ref 3.5–5.0)
Alkaline Phosphatase: 134 U/L — ABNORMAL HIGH (ref 38–126)
Anion gap: 14 (ref 5–15)
BUN: 14 mg/dL (ref 6–20)
CO2: 25 mmol/L (ref 22–32)
Calcium: 9.8 mg/dL (ref 8.9–10.3)
Chloride: 101 mmol/L (ref 98–111)
Creatinine: 1.09 mg/dL — ABNORMAL HIGH (ref 0.44–1.00)
GFR, Est AFR Am: >60 mL/min (ref >60)
GFR, Estimated: 57 mL/min — ABNORMAL LOW (ref >60)
Glucose, Bld: 185 mg/dL — ABNORMAL HIGH (ref 70–99)
Potassium: 4.1 mmol/L (ref 3.5–5.1)
Sodium: 140 mmol/L (ref 135–145)
Total Bilirubin: 0.2 mg/dL — ABNORMAL LOW (ref 0.3–1.2)
Total Protein: 8.9 g/dL — ABNORMAL HIGH (ref 6.5–8.1)

## 2019-01-11 LAB — CBC WITH DIFFERENTIAL/PLATELET
Abs Immature Granulocytes: 0.01 10*3/uL (ref 0.00–0.07)
Basophils Absolute: 0 10*3/uL (ref 0.0–0.1)
Basophils Relative: 1 %
Eosinophils Absolute: 0 10*3/uL (ref 0.0–0.5)
Eosinophils Relative: 1 %
HCT: 42.8 % (ref 36.0–46.0)
Hemoglobin: 13 g/dL (ref 12.0–15.0)
Immature Granulocytes: 0 %
Lymphocytes Relative: 47 %
Lymphs Abs: 3.3 10*3/uL (ref 0.7–4.0)
MCH: 22.4 pg — ABNORMAL LOW (ref 26.0–34.0)
MCHC: 30.4 g/dL (ref 30.0–36.0)
MCV: 73.8 fL — ABNORMAL LOW (ref 80.0–100.0)
Monocytes Absolute: 0.5 10*3/uL (ref 0.1–1.0)
Monocytes Relative: 8 %
Neutro Abs: 3 10*3/uL (ref 1.7–7.7)
Neutrophils Relative %: 43 %
Platelets: 233 10*3/uL (ref 150–400)
RBC: 5.8 MIL/uL — ABNORMAL HIGH (ref 3.87–5.11)
RDW: 16.4 % — ABNORMAL HIGH (ref 11.5–15.5)
WBC: 6.9 10*3/uL (ref 4.0–10.5)
nRBC: 0 % (ref 0.0–0.2)

## 2019-01-11 NOTE — Telephone Encounter (Signed)
Per 11/16 los RTC with Dr Irene Limbo as needed.  Patient declined avs.
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
## 2019-01-12 ENCOUNTER — Ambulatory Visit (INDEPENDENT_AMBULATORY_CARE_PROVIDER_SITE_OTHER): Payer: 59 | Admitting: Physician Assistant

## 2019-01-12 ENCOUNTER — Encounter (INDEPENDENT_AMBULATORY_CARE_PROVIDER_SITE_OTHER): Payer: Self-pay | Admitting: Physician Assistant

## 2019-01-12 VITALS — BP 138/78 | HR 84 | Temp 97.9°F | Ht 63.0 in | Wt 247.0 lb

## 2019-01-12 DIAGNOSIS — Z794 Long term (current) use of insulin: Secondary | ICD-10-CM | POA: Diagnosis not present

## 2019-01-12 DIAGNOSIS — Z6841 Body Mass Index (BMI) 40.0 and over, adult: Secondary | ICD-10-CM | POA: Diagnosis not present

## 2019-01-12 DIAGNOSIS — E119 Type 2 diabetes mellitus without complications: Secondary | ICD-10-CM

## 2019-01-13 LAB — MULTIPLE MYELOMA PANEL, SERUM
Albumin SerPl Elph-Mcnc: 3.7 g/dL (ref 2.9–4.4)
Albumin/Glob SerPl: 0.9 (ref 0.7–1.7)
Alpha 1: 0.2 g/dL (ref 0.0–0.4)
Alpha2 Glob SerPl Elph-Mcnc: 1 g/dL (ref 0.4–1.0)
B-Globulin SerPl Elph-Mcnc: 1.4 g/dL — ABNORMAL HIGH (ref 0.7–1.3)
Gamma Glob SerPl Elph-Mcnc: 1.7 g/dL (ref 0.4–1.8)
Globulin, Total: 4.3 g/dL — ABNORMAL HIGH (ref 2.2–3.9)
IgA: 522 mg/dL — ABNORMAL HIGH (ref 87–352)
IgG (Immunoglobin G), Serum: 1882 mg/dL — ABNORMAL HIGH (ref 586–1602)
IgM (Immunoglobulin M), Srm: 59 mg/dL (ref 26–217)
Total Protein ELP: 8 g/dL (ref 6.0–8.5)

## 2019-01-13 NOTE — Progress Notes (Signed)
Office: [phone]  /  Fax: [phone]   HPI:   Chief Complaint: OBESITY Deyaneira is here to discuss her progress with her obesity treatment plan. She is on the Category 4 plan and is following her eating plan approximately 20 % of the time. She states she is doing yard work 3 times per week. Marshe reports she has been skipping lunch at times, due to keeping her granddaughter. She is not weighing her meat at night, but she feels like she is overeating. Her weight is 247 lb (112 kg) today and has had a weight loss of 4 pounds over a period of 3 weeks since her last visit. She has lost 4 lbs since starting treatment with [place].  Diabetes II uncontrolled Ketty has a diagnosis of diabetes type II. She is on insulin 30 units nightly, Metformin and Farxiga. Carrianne hasn't been checking her fasting BGs the last two weeks, because she ran out of needles. Findlay denies any hypoglycemic episodes. Last A1c was at 9.5 (12/30/18). She has been working on intensive lifestyle modifications including diet, exercise, and weight loss to help control her blood glucose levels.  ASSESSMENT AND PLAN:  Type 2 diabetes mellitus without complication, with long-term current use of insulin (HCC)  Class 3 severe obesity with serious comorbidity and body mass index (BMI) of 40.0 to 44.9 in adult, unspecified obesity type ([place])  PLAN:  Diabetes II uncontrolled Bri has been given extensive diabetes education by myself today including ideal fasting and post-prandial blood glucose readings, individual ideal Hgb A1c goals and hypoglycemia prevention. We discussed the importance of good blood sugar control to decrease the likelihood of diabetic complications such as nephropathy, neuropathy, limb loss, blindness, coronary artery disease, and death. We discussed the importance of intensive lifestyle modification including diet, exercise and weight loss as the first line treatment for diabetes. Atasha will continue with medications and  weight loss and she will follow up at the agreed upon time.  I spent > than 50% of the 25 minute visit on counseling as documented in the note.  Obesity Jasmene is currently in the action stage of change. As such, her goal is to continue with weight loss efforts She has agreed to follow the Category 4 plan Morayma has been instructed to work up to a goal of 150 minutes of combined cardio and strengthening exercise per week for weight loss and overall health benefits. We discussed the following Behavioral Modification Strategies today: keeping healthy foods in the home and work on meal planning and easy cooking plans  Alexina has agreed to follow up with our clinic in 3 weeks. She was informed of the importance of frequent follow up visits to maximize her success with intensive lifestyle modifications for her multiple health conditions.  I spent > than 50% of the 25 minute visit on counseling as documented in the note.   ALLERGIES: Allergies  Allergen Reactions   Naprosyn [Naproxen] Nausea Only    MEDICATIONS: Current Outpatient Medications on File Prior to Visit  Medication Sig Dispense Refill   amLODipine (NORVASC) 5 MG tablet TAKE 1 TABLET BY MOUTH EVERY DAY 90 tablet 0   B-D ULTRAFINE III SHORT PEN 31G X 8 MM MISC USE TO INJECT BASAGLAR DAILY 100 each 11   benazepril (LOTENSIN) 40 MG tablet TAKE 1 TABLET BY MOUTH EVERY DAY 90 tablet 0   cetirizine (ZYRTEC) 10 MG tablet Take 10 mg by mouth daily.     cholecalciferol (VITAMIN D) 1000 units tablet Take 2,000  Units by mouth at bedtime.      dapagliflozin propanediol (FARXIGA) 10 MG TABS tablet Take 10 mg by mouth daily before breakfast. 90 tablet 3   glucose blood (FREESTYLE LITE) test strip 1 each by Other route 4 (four) times daily. And lancets 4/day. 360 each 3   hydrochlorothiazide (HYDRODIURIL) 25 MG tablet Take 1 tablet (25 mg total) by mouth daily. 90 tablet 3   metFORMIN (GLUCOPHAGE) 500 MG tablet Take 1 tablet (500 mg total)  by mouth daily with breakfast. 30 tablet 11   metoprolol tartrate (LOPRESSOR) 25 MG tablet Take 1 tablet (25 mg total) by mouth 2 (two) times daily. 60 tablet 4   Omega-3 Fatty Acids (FISH OIL PO) Take 1 capsule by mouth at bedtime. Reported on 08/08/2015     omeprazole (PRILOSEC) 40 MG capsule TAKE 1 CAPSULE BY MOUTH EVERY DAY 90 capsule 0   ondansetron (ZOFRAN) 4 MG tablet Take 4 mg by mouth every 8 (eight) hours as needed for nausea or vomiting.     rivaroxaban (XARELTO) 20 MG TABS tablet Take 1 tablet (20 mg total) by mouth daily with supper. 30 tablet 4   simvastatin (ZOCOR) 20 MG tablet Take 20 mg by mouth daily.     Vitamin D, Ergocalciferol, (DRISDOL) 1.25 MG (50000 UT) CAPS capsule Take 1 capsule (50,000 Units total) by mouth every 7 (seven) days. 4 capsule 0   No current facility-administered medications on file prior to visit.     PAST MEDICAL HISTORY: Past Medical History:  Diagnosis Date   Acid reflux    Anemia    Arthritis    Asthma    Cervical dysplasia    Diabetes mellitus    DVT (deep venous thrombosis) (HCC)    Gallbladder problem    GERD (gastroesophageal reflux disease)    Heart murmur    Hx of blood clots    Hyperlipidemia    Hypertension    LBP (low back pain)    Liver problem    Obesity    OSA (obstructive sleep apnea) 03/22/2018   New diagnosis. Mild.    Pulmonary embolism (HCC)    Sarcoidosis    Sleep apnea    Vitamin D deficiency     PAST SURGICAL HISTORY: Past Surgical History:  Procedure Laterality Date   BREAST BIOPSY Left 2019   BUNIONECTOMY     CHOLECYSTECTOMY     COLPOSCOPY     GYNECOLOGIC CRYOSURGERY     KNEE ARTHROSCOPY     TUBAL LIGATION      SOCIAL HISTORY: Social History   Tobacco Use   Smoking status: Never Smoker   Smokeless tobacco: Never Used  Substance Use Topics   Alcohol use: No   Drug use: No    FAMILY HISTORY: Family History  Problem Relation Age of Onset   Heart  failure Mother 49       chf   Diabetes Mother    High blood pressure Mother    Heart disease Mother    Sudden death Mother    Kidney disease Mother    Obesity Mother    Heart failure Father    Heart disease Father    Sudden death Father    Alcoholism Father    Cancer Brother 27       prostate   Hypertension Maternal Uncle    Hyperlipidemia Brother    Diabetes Brother    Colon cancer Maternal Grandfather    Colon polyps Brother    Diabetes Brother  Hyperlipidemia Brother    Stomach cancer Neg Hx    Esophageal cancer Neg Hx    Rectal cancer Neg Hx    Liver cancer Neg Hx    Breast cancer Neg Hx     ROS: Review of Systems  Constitutional: Positive for weight loss.  Endo/Heme/Allergies:       Negative for hypoglycemia    PHYSICAL EXAM: Blood pressure 138/78, pulse 84, temperature 97.9 F (36.6 C), temperature source Oral, height 5\' 3"  (1.6 m), weight 247 lb (112 kg), SpO2 96 %. Body mass index is 43.75 kg/m. Physical Exam Vitals signs reviewed.  Constitutional:      Appearance: Normal appearance. She is well-developed. She is obese.  Cardiovascular:     Rate and Rhythm: Normal rate.  Pulmonary:     Effort: Pulmonary effort is normal.  Musculoskeletal: Normal range of motion.  Skin:    General: Skin is warm and dry.  Neurological:     Mental Status: She is alert and oriented to person, place, and time.  Psychiatric:        Mood and Affect: Mood normal.        Behavior: Behavior normal.     RECENT LABS AND TESTS: BMET    Component Value Date/Time   NA 140 01/11/2019 1429   NA 139 09/15/2018 0845   K 4.1 01/11/2019 1429   CL 101 01/11/2019 1429   CO2 25 01/11/2019 1429   GLUCOSE 185 (H) 01/11/2019 1429   BUN 14 01/11/2019 1429   BUN 15 09/15/2018 0845   CREATININE 1.09 (H) 01/11/2019 1429   CREATININE 0.66 12/04/2016 0831   CALCIUM 9.8 01/11/2019 1429   GFRNONAA 57 (L) 01/11/2019 1429   GFRNONAA 101 12/04/2016 0831   GFRAA  >60 01/11/2019 1429   GFRAA 117 12/04/2016 0831   Lab Results  Component Value Date   HGBA1C 9.5 (A) 12/30/2018   HGBA1C 10.5 (A) 11/04/2018   HGBA1C 11.1 (H) 08/18/2018   HGBA1C 10.5 (A) 03/11/2018   HGBA1C 8.4 (A) 01/08/2018   No results found for: INSULIN CBC    Component Value Date/Time   WBC 6.9 01/11/2019 1429   RBC 5.80 (H) 01/11/2019 1429   HGB 13.0 01/11/2019 1429   HGB 11.9 08/26/2018 1232   HCT 42.8 01/11/2019 1429   HCT 39.2 08/26/2018 1232   PLT 233 01/11/2019 1429   PLT 327 08/26/2018 1232   MCV 73.8 (L) 01/11/2019 1429   MCV 73 (L) 08/26/2018 1232   MCH 22.4 (L) 01/11/2019 1429   MCHC 30.4 01/11/2019 1429   RDW 16.4 (H) 01/11/2019 1429   RDW 15.8 (H) 08/26/2018 1232   LYMPHSABS 3.3 01/11/2019 1429   LYMPHSABS 2.6 08/26/2018 1232   MONOABS 0.5 01/11/2019 1429   EOSABS 0.0 01/11/2019 1429   EOSABS 0.1 08/26/2018 1232   BASOSABS 0.0 01/11/2019 1429   BASOSABS 0.1 08/26/2018 1232   Iron/TIBC/Ferritin/ %Sat    Component Value Date/Time   IRON 66 08/08/2015 0743   TIBC 420 08/08/2015 0743   IRONPCTSAT 16 08/08/2015 0743   Lipid Panel     Component Value Date/Time   CHOL 142 09/15/2018 0845   TRIG 117 09/15/2018 0845   HDL 50 09/15/2018 0845   CHOLHDL 2.8 09/15/2018 0845   CHOLHDL 3.5 12/04/2016 0831   VLDL 27 01/23/2016 0937   LDLCALC 69 09/15/2018 0845   LDLCALC 121 (H) 12/04/2016 0831   Hepatic Function Panel     Component Value Date/Time   PROT 8.9 (H) 01/11/2019  1429   PROT 7.5 09/15/2018 0845   ALBUMIN 3.9 01/11/2019 1429   ALBUMIN 4.2 09/15/2018 0845   AST 34 01/11/2019 1429   ALT 41 01/11/2019 1429   ALKPHOS 134 (H) 01/11/2019 1429   BILITOT 0.2 (L) 01/11/2019 1429   BILIDIR 0.08 09/15/2018 0845      Component Value Date/Time   TSH 1.430 12/15/2018 0855   TSH 0.955 08/15/2018 1432   TSH 1.430 02/13/2018 1425   TSH 0.843 05/08/2017 0909     Ref. Range 12/15/2018 08:55  Vitamin D, 25-Hydroxy Latest Ref Range: 30.0 - 100.0  ng/mL 34.9    OBESITY BEHAVIORAL INTERVENTION VISIT  Today's visit was # 3   Starting weight: 251 lbs Starting date: 12/07/2018 Today's weight : 247 lbs Today's date: 01/12/2019 Total lbs lost to date: 4    01/12/2019  Height 5\' 3"  (1.6 m)  Weight 247 lb (112 kg)  BMI (Calculated) 43.76  BLOOD PRESSURE - SYSTOLIC 0000000  BLOOD PRESSURE - DIASTOLIC 78   Body Fat % [id number] %    ASK: We discussed the diagnosis of obesity with Audery Amel today and Melene Muller agreed to give [place] permission to discuss obesity behavioral modification therapy today.  ASSESS: Sakhia has the diagnosis of obesity and her BMI today is 43.76 Jora is in the action stage of change   ADVISE: Reianna was educated on the multiple health risks of obesity as well as the benefit of weight loss to improve her health. She was advised of the need for long term treatment and the importance of lifestyle modifications to improve her current health and to decrease her risk of future health problems.  AGREE: Multiple dietary modification options and treatment options were discussed and  Rafeef agreed to follow the recommendations documented in the above note.  ARRANGE: Aalanah was educated on the importance of frequent visits to treat obesity as outlined per CMS and USPSTF guidelines and agreed to schedule her next follow up appointment today.  Corey Skains, am acting as transcriptionist for Abby Potash, PA-C I, Abby Potash, PA-C have reviewed above note and agree with its content

## 2019-01-18 LAB — MISC LABCORP TEST (SEND OUT): Labcorp test code: 500309

## 2019-01-26 ENCOUNTER — Ambulatory Visit (INDEPENDENT_AMBULATORY_CARE_PROVIDER_SITE_OTHER): Payer: 59 | Admitting: Adult Health

## 2019-01-26 ENCOUNTER — Encounter: Payer: Self-pay | Admitting: Adult Health

## 2019-01-26 ENCOUNTER — Other Ambulatory Visit: Payer: Self-pay

## 2019-01-26 DIAGNOSIS — D869 Sarcoidosis, unspecified: Secondary | ICD-10-CM | POA: Diagnosis not present

## 2019-01-26 DIAGNOSIS — G4733 Obstructive sleep apnea (adult) (pediatric): Secondary | ICD-10-CM

## 2019-01-26 DIAGNOSIS — I2699 Other pulmonary embolism without acute cor pulmonale: Secondary | ICD-10-CM | POA: Diagnosis not present

## 2019-01-26 NOTE — Progress Notes (Signed)
Virtual Visit via Telephone Note  I connected with Victoria Padilla on 01/26/19 at  1:30 PM EST by telephone and verified that I am speaking with the correct person using two identifiers.  Location: Patient: Home  Provider: Office    I discussed the limitations, risks, security and privacy concerns of performing an evaluation and management service by telephone and the availability of in person appointments. I also discussed with the patient that there may be a patient responsible charge related to this service. The patient expressed understanding and agreed to proceed.   History of Present Illness: 53 year old female seen for sleep consult August 2020.  Patient also has underlying sarcoidosis diagnosed around 2010 from liver biopsy.  Patient was diagnosed with a PE in June 2020.  She had a presyncopal episode and was found to have bilateral DVTs and pulmonary emboli.  She was started on Xarelto-lifelong.  Echo showed RVSP of 56 mm consistent with RV strain.  She was discharged on oxygen.  She slowly made improvement and oxygen was discontinued in August.   Today's televisit is a follow-up for sleep apnea.  Patient had home sleep study done in January 2020 that showed mild sleep apnea with AHI 10.  She was recently started on CPAP at bedtime due to significant daytime sleepiness and fatigue.  Patient was placed on CPAP auto at 5 to 15 cm H2O.  She is recently changed to the DreamWear full facemask.  She says she is tolerating the mask.  She is trying to wear it more.  Her download shows poor compliance.  Average usage at 2 days.  AHI 0.5.  Average usage at 7 hours. Patient says she is wore this a lot more than 2 days.  She says it is hard to tell if she has benefit from it yet she has not noticed an increase in her energy level.    Observations/Objective:   Assessment and Plan: Mild obstructive sleep apnea recently started on CPAP.  Patient education on sleep apnea and potential complications  of untreated sleep apnea.  Patient education with CPAP.  Will contact DME company to evaluate her machine as she says she is wearing it a lot more than is indicated on her download.  Plan  Patient Instructions  Please wear CPAP each night.  Try to get in at least 4 to 6 hours. Work on healthy weight Activity as tolerated Do not drive if sleepy Continue on Xarelto daily. Follow-up with oncology for recent lab work Follow-up with Dr. Elsworth Soho in 3 to 4 months and as needed      Follow Up Instructions: Follow-up in 3 to 4 months and as needed    I discussed the assessment and treatment plan with the patient. The patient was provided an opportunity to ask questions and all were answered. The patient agreed with the plan and demonstrated an understanding of the instructions.   The patient was advised to call back or seek an in-person evaluation if the symptoms worsen or if the condition fails to improve as anticipated.  I provided 22 minutes of non-face-to-face time during this encounter.   [name], NP

## 2019-01-26 NOTE — Addendum Note (Signed)
Addended by: Parke Poisson E on: 01/26/2019 02:57 PM   Modules accepted: Orders

## 2019-01-26 NOTE — Patient Instructions (Signed)
Please wear CPAP each night.  Try to get in at least 4 to 6 hours. Work on healthy weight Activity as tolerated Do not drive if sleepy Continue on Xarelto daily. Follow-up with oncology for recent lab work Follow-up with Dr. Elsworth Soho in 3 to 4 months and as needed

## 2019-02-02 ENCOUNTER — Ambulatory Visit (INDEPENDENT_AMBULATORY_CARE_PROVIDER_SITE_OTHER): Payer: 59 | Admitting: Physician Assistant

## 2019-02-03 ENCOUNTER — Ambulatory Visit (INDEPENDENT_AMBULATORY_CARE_PROVIDER_SITE_OTHER): Payer: 59 | Admitting: Physician Assistant

## 2019-02-08 ENCOUNTER — Telehealth: Payer: Self-pay

## 2019-02-08 NOTE — Telephone Encounter (Signed)
Pt will need to check with her insurance company to determine what is covered by her plan. She will then need to call back with that specific brand covered by her plan.

## 2019-02-08 NOTE — Telephone Encounter (Signed)
Patient called in stating that she doesn't use the test strips Dr sent to pharmacy and had to spend $110 for the wrong test strips     Please call and advise

## 2019-02-09 ENCOUNTER — Encounter (INDEPENDENT_AMBULATORY_CARE_PROVIDER_SITE_OTHER): Payer: Self-pay | Admitting: Physician Assistant

## 2019-02-09 ENCOUNTER — Ambulatory Visit (INDEPENDENT_AMBULATORY_CARE_PROVIDER_SITE_OTHER): Payer: 59 | Admitting: Physician Assistant

## 2019-02-09 ENCOUNTER — Other Ambulatory Visit: Payer: Self-pay

## 2019-02-09 VITALS — BP 147/100 | HR 72 | Temp 98.4°F | Ht 63.0 in | Wt 250.0 lb

## 2019-02-09 DIAGNOSIS — E559 Vitamin D deficiency, unspecified: Secondary | ICD-10-CM

## 2019-02-09 DIAGNOSIS — E119 Type 2 diabetes mellitus without complications: Secondary | ICD-10-CM

## 2019-02-09 DIAGNOSIS — Z9189 Other specified personal risk factors, not elsewhere classified: Secondary | ICD-10-CM

## 2019-02-09 DIAGNOSIS — Z6841 Body Mass Index (BMI) 40.0 and over, adult: Secondary | ICD-10-CM

## 2019-02-09 MED ORDER — ONETOUCH VERIO VI STRP: ORAL_STRIP | 0 refills | Status: DC

## 2019-02-09 MED ORDER — VITAMIN D (ERGOCALCIFEROL) 1.25 MG (50000 UNIT) PO CAPS: 50000.0000 [IU] | ORAL_CAPSULE | ORAL | 0 refills | Status: DC

## 2019-02-11 ENCOUNTER — Other Ambulatory Visit: Payer: Self-pay | Admitting: Family Medicine

## 2019-02-11 ENCOUNTER — Ambulatory Visit (INDEPENDENT_AMBULATORY_CARE_PROVIDER_SITE_OTHER): Payer: 59 | Admitting: Family Medicine

## 2019-02-11 ENCOUNTER — Ambulatory Visit
Admission: RE | Admit: 2019-02-11 | Discharge: 2019-02-11 | Disposition: A | Discharge status: Home / Self Care | Payer: 59 | Source: Ambulatory Visit | Attending: Family Medicine | Admitting: Family Medicine

## 2019-02-11 ENCOUNTER — Other Ambulatory Visit: Payer: Self-pay

## 2019-02-11 ENCOUNTER — Encounter: Payer: Self-pay | Admitting: Family Medicine

## 2019-02-11 VITALS — BP 120/84 | HR 78 | Temp 98.4°F | Wt 259.8 lb

## 2019-02-11 DIAGNOSIS — M545 Low back pain, unspecified: Secondary | ICD-10-CM

## 2019-02-11 DIAGNOSIS — R1032 Left lower quadrant pain: Secondary | ICD-10-CM | POA: Diagnosis not present

## 2019-02-11 DIAGNOSIS — M25552 Pain in left hip: Secondary | ICD-10-CM

## 2019-02-11 LAB — POCT URINALYSIS DIP (PROADVANTAGE DEVICE)
Bilirubin, UA: NEGATIVE
Blood, UA: NEGATIVE
Glucose, UA: 500 mg/dL — AB
Ketones, POC UA: NEGATIVE mg/dL
Leukocytes, UA: NEGATIVE
Nitrite, UA: NEGATIVE
Protein Ur, POC: NEGATIVE mg/dL
Specific Gravity, Urine: 1.015
Urobilinogen, Ur: NEGATIVE
pH, UA: 6 (ref 5.0–8.0)

## 2019-02-11 LAB — COMPREHENSIVE METABOLIC PANEL
ALT: 32 IU/L (ref 0–32)
AST: 29 IU/L (ref 0–40)
Albumin/Globulin Ratio: 1.2 (ref 1.2–2.2)
Albumin: 4.2 g/dL (ref 3.8–4.9)
Alkaline Phosphatase: 140 IU/L — ABNORMAL HIGH (ref 39–117)
BUN/Creatinine Ratio: 15 (ref 9–23)
BUN: 14 mg/dL (ref 6–24)
Bilirubin Total: 0.3 mg/dL (ref 0.0–1.2)
CO2: 33 mmol/L — ABNORMAL HIGH (ref 20–29)
Calcium: 9.7 mg/dL (ref 8.7–10.2)
Chloride: 100 mmol/L (ref 96–106)
Creatinine, Ser: 0.92 mg/dL (ref 0.57–1.00)
GFR calc Af Amer: 81 mL/min/{1.73_m2} (ref >59)
GFR calc non Af Amer: 70 mL/min/{1.73_m2} (ref >59)
Globulin, Total: 3.5 g/dL (ref 1.5–4.5)
Glucose: 154 mg/dL — ABNORMAL HIGH (ref 65–99)
Potassium: 4.3 mmol/L (ref 3.5–5.2)
Sodium: 138 mmol/L (ref 134–144)
Total Protein: 7.7 g/dL (ref 6.0–8.5)

## 2019-02-11 LAB — CBC WITH DIFFERENTIAL/PLATELET
Basophils Absolute: 0 10*3/uL (ref 0.0–0.2)
Basos: 0 %
EOS (ABSOLUTE): 0 10*3/uL (ref 0.0–0.4)
Eos: 0 %
Hematocrit: 39.7 % (ref 34.0–46.6)
Hemoglobin: 12.8 g/dL (ref 11.1–15.9)
Lymphocytes Absolute: 3.5 10*3/uL — ABNORMAL HIGH (ref 0.7–3.1)
Lymphs: 52 %
MCH: 23 pg — ABNORMAL LOW (ref 26.6–33.0)
MCHC: 32.2 g/dL (ref 31.5–35.7)
MCV: 71 fL — ABNORMAL LOW (ref 79–97)
Monocytes Absolute: 0.6 10*3/uL (ref 0.1–0.9)
Monocytes: 9 %
Neutrophils Absolute: 2.6 10*3/uL (ref 1.4–7.0)
Neutrophils: 39 %
Platelets: 204 10*3/uL (ref 150–450)
RBC: 5.57 x10E6/uL — ABNORMAL HIGH (ref 3.77–5.28)
RDW: 15.2 % (ref 11.7–15.4)
WBC: 6.7 10*3/uL (ref 3.4–10.8)

## 2019-02-11 MED ORDER — KETOROLAC TROMETHAMINE 60 MG/2ML IM SOLN
60.0000 mg | Freq: Once | INTRAMUSCULAR | Status: AC
  Administered 2019-02-11: 60 mg via INTRAMUSCULAR

## 2019-02-11 MED ORDER — TRAMADOL HCL 50 MG PO TABS: 50.0000 mg | ORAL_TABLET | Freq: Three times a day (TID) | ORAL | 0 refills | Status: AC | PRN

## 2019-02-11 NOTE — Progress Notes (Signed)
Please call patient Friday morning to see how she is feeling. Any new or worsening symptoms? Pain improved? I called her Thursday evening with her lab and X ray results. She reported some improvement.

## 2019-02-11 NOTE — Patient Instructions (Signed)
Go to [facility] for your hip X ray.   You received a Toradol injection in the office today. If you experience any bleeding, let me know.   I will be in touch with lab and X ray results.   If your pain becomes more severe or if you have any new or worsening symptoms and our office is closed, you will need to call 911 or go to the closest emergency department.

## 2019-02-11 NOTE — Progress Notes (Signed)
Subjective:    Patient ID: Victoria Padilla, female    DOB: Aug 18, 1965, 53 y.o.   MRN: [id number]  HPI Chief Complaint  Patient presents with  . lower back pain    lower back pain and moves to the left side in groin area. getting worse since monday    She is here with complaints of a 4 day history of left groin pain and left low back pain that feels like two separate issues. The left groin pain started first and is constant. Pain is a dull ache and pressure. Certain movements makes her pain worse.   Has been taking Tylenol 1,000 mg at bedtime and this has only helped her pain minimally.   Pain in her low back started the next day and is across her left low back. Pain is worse with sitting and with walking. Pain is a throbbing sensation. Bending makes the pain much worse. Laying still helps her low back pain.   No numbness, tingling or weakness.  No loss of control of bowels or bladder.   States she has also has had intermittent bilateral leg cramping since last week. Not as bad today.   Bowel movements are regular.  Stools have had some mucus. No blood. Denies N/V/D or constipation.    States she has been eating a high protein diet and trying to lose weight.   Denies fever, chills, headache, dizziness, chest pain, palpitations, shortness of breath, cough, GU symptoms.   Review of Systems Pertinent positives and negatives in the history of present illness.     Objective:   Physical Exam Constitutional:      General: She is in acute distress.     Appearance: She is obese.  Eyes:     General: Lids are normal.     Conjunctiva/sclera: Conjunctivae normal.  Cardiovascular:     Rate and Rhythm: Normal rate.     Pulses: Normal pulses.  Pulmonary:     Effort: Pulmonary effort is normal.  Abdominal:     General: Abdomen is flat.     Palpations: Abdomen is soft.     Tenderness: There is no abdominal tenderness. There is no right CVA tenderness, left CVA tenderness, guarding or  rebound.  Musculoskeletal:     Cervical back: Normal, normal range of motion and neck supple.     Thoracic back: Normal.     Lumbar back: Decreased range of motion. Negative right straight leg raise test and negative left straight leg raise test.     Right hip: Normal.     Left hip: Tenderness present. Decreased range of motion.     Left lower leg: Normal.     Left foot: Normal pulse.     Comments: She is able to ambulate slowly. TTP over left lateral hip and anterior groin. She is able to flex and extend her left hip as well as internally and externally rotate with limited ROM and worsening pain. She is tearful during exam due to pain.   Skin:    General: Skin is warm and dry.     Capillary Refill: Capillary refill takes less than 2 seconds.     Findings: No bruising or rash.  Neurological:     General: No focal deficit present.     Mental Status: She is alert.     Cranial Nerves: Cranial nerves are intact.     Sensory: Sensation is intact.     Gait: Gait abnormal.    BP 120/84  Pulse 78   Temp 98.4 F (36.9 C)   Wt 259 lb 12.8 oz (117.8 kg)   BMI 46.02 kg/m      Assessment & Plan:  Hip joint painful on movement, left - Plan: POCT Urinalysis DIP (Proadvantage Device), CBC with Differential, Comprehensive metabolic panel, ketorolac (TORADOL) injection 60 mg, traMADol (ULTRAM) 50 MG tablet, CANCELED: DG Hip Unilat W OR W/O Pelvis Min 4 Views Left  Acute left-sided low back pain without sciatica - Plan: POCT Urinalysis DIP (Proadvantage Device), CBC with Differential, Comprehensive metabolic panel, traMADol (ULTRAM) 50 MG tablet, CANCELED: DG Hip Unilat W OR W/O Pelvis Min 4 Views Left  Left groin pain - Plan: POCT Urinalysis DIP (Proadvantage Device), CBC with Differential, Comprehensive metabolic panel, traMADol (ULTRAM) 50 MG tablet, CANCELED: DG Hip Unilat W OR W/O Pelvis Min 4 Views Left  No known injury of left hip, low back or groin. Bilateral LE are neurovascularly  intact. No red flag symptoms. It is concerning the amount of pain she is in and this worsens with movement which speaks to a MSK etiology.  She drove herself here today and states she will be able to go to [facility] for an X ray. I will also order stat CBC, CMP.  Discussed pain management while in office and decided a Toradol injection is the best option since Tylenol has not helped and she is driving. Discussed risks of increased bleeding and she will closely monitor for this. Continue with symptomatic treatment at home. Tramadol prescribed for severe pain. States she has done fine with this in the past  Strict precaution that if she has any worsening or new symptoms that she may need to go to the ED for further evaluation and she agrees to this plan.

## 2019-02-11 NOTE — Progress Notes (Signed)
Office: [phone]  /  Fax: [phone]   HPI:  Chief Complaint: OBESITY Victoria Padilla is here to discuss her progress with her obesity treatment plan. She is on the Category 4 plan and states she is following her eating plan approximately 25 % of the time. She states she is exercising 0 minutes 0 times per week.  Willola is trying to combine her breakfast and lunch meals, because she skips lunch at times, but she is unable to eat all of the food at once.  Diabetes II Victoria Padilla has a diagnosis of diabetes type II. Victoria Padilla is on Metformin and [name]. She has no nausea, vomiting, diarrhea or UTI's. Victoria Padilla has no polyphagia or hypoglycemia. She has not been checking her blood sugars, due to being out of strips.  At risk for cardiovascular disease Victoria Padilla is at a higher than average risk for cardiovascular disease due to obesity and diabetes. She currently denies any chest pain.  Vitamin D deficiency Victoria Padilla has a diagnosis of vitamin D deficiency. Victoria Padilla is on vitamin D and she has no nausea, vomiting or muscle weakness.   Today's visit was # 4  Starting weight: 251 lbs Starting date: 12/07/2018 Today's weight : 250 lbs  Today's date: 02/11/2019 Total lbs lost to date: 1 Total lbs lost since last in-office visit: 0  ASSESSMENT AND PLAN:  Type 2 diabetes mellitus without complication, without long-term current use of insulin (Victoria Padilla) - Plan: glucose blood (ONETOUCH VERIO) test strip  Vitamin D deficiency - Plan: Vitamin D, Ergocalciferol, (DRISDOL) 1.25 MG (50000 UT) CAPS capsule  At risk for heart disease  Class 3 severe obesity with serious comorbidity and body mass index (BMI) of 40.0 to 44.9 in adult, unspecified obesity type (Victoria Padilla)  PLAN:  Diabetes II Victoria Padilla has been given diabetes education by myself today. Good blood sugar control is important to decrease the likelihood of diabetic complications such as nephropathy, neuropathy, limb loss, blindness, coronary artery disease, and death. Intensive  lifestyle modification including diet, exercise and weight loss were discussed as the first line treatment for diabetes. Prescription refill was written today for Verio test strips #100 with no refills. Lennon agrees to follow up as directed.   Cardiovascular risk counseling Victoria Padilla was given (~15 minutes) coronary artery disease prevention counseling today. She is 53 y.o. female and has risk factors for heart disease including obesity and diabetes. We discussed intensive lifestyle modifications today with an emphasis on specific weight loss instructions and strategies.   Vitamin D Deficiency Low vitamin D level contributes to fatigue and are associated with obesity, breast, and colon cancer. Victoria Padilla agrees to continue to take prescription Vit D @50 ,000 IU every week #4 with no refills and she will follow up for routine testing of vitamin D, at least 2-3 times per year to avoid over-replacement. Victoria Padilla agrees to follow up with our clinic in 3 weeks.  Obesity Victoria Padilla is currently in the action stage of change. As such, her goal is to continue with weight loss efforts She has agreed to follow the Category 4 plan Victoria Padilla has been instructed to work up to a goal of 150 minutes of combined cardio and strengthening exercise per week for weight loss and overall health benefits. We discussed the following Behavioral Modification Strategies today: no skipping meals and work on meal planning and easy cooking plans  Victoria Padilla has agreed to follow up with our clinic in 3 weeks. She was informed of the importance of frequent follow up visits to maximize her success with intensive  lifestyle modifications for her multiple health conditions.  ALLERGIES: Allergies  Allergen Reactions   Naprosyn [Naproxen] Nausea Only    MEDICATIONS: Current Outpatient Medications on File Prior to Visit  Medication Sig Dispense Refill   amLODipine (NORVASC) 5 MG tablet TAKE 1 TABLET BY MOUTH EVERY DAY 90 tablet 0   B-D ULTRAFINE III SHORT  PEN 31G X 8 MM MISC USE TO INJECT BASAGLAR DAILY 100 each 11   benazepril (LOTENSIN) 40 MG tablet TAKE 1 TABLET BY MOUTH EVERY DAY 90 tablet 0   cetirizine (ZYRTEC) 10 MG tablet Take 10 mg by mouth daily.     cholecalciferol (VITAMIN D) 1000 units tablet Take 2,000 Units by mouth at bedtime.      dapagliflozin propanediol (FARXIGA) 10 MG TABS tablet Take 10 mg by mouth daily before breakfast. 90 tablet 3   glucose blood (FREESTYLE LITE) test strip 1 each by Other route 4 (four) times daily. And lancets 4/day. 360 each 3   hydrochlorothiazide (HYDRODIURIL) 25 MG tablet Take 1 tablet (25 mg total) by mouth daily. 90 tablet 3   metFORMIN (GLUCOPHAGE) 500 MG tablet Take 1 tablet (500 mg total) by mouth daily with breakfast. 30 tablet 11   metoprolol tartrate (LOPRESSOR) 25 MG tablet Take 1 tablet (25 mg total) by mouth 2 (two) times daily. 60 tablet 4   Omega-3 Fatty Acids (FISH OIL PO) Take 1 capsule by mouth at bedtime. Reported on 08/08/2015     omeprazole (PRILOSEC) 40 MG capsule TAKE 1 CAPSULE BY MOUTH EVERY DAY 90 capsule 0   ondansetron (ZOFRAN) 4 MG tablet Take 4 mg by mouth every 8 (eight) hours as needed for nausea or vomiting.     rivaroxaban (XARELTO) 20 MG TABS tablet Take 1 tablet (20 mg total) by mouth daily with supper. 30 tablet 4   simvastatin (ZOCOR) 20 MG tablet Take 20 mg by mouth daily.     No current facility-administered medications on file prior to visit.    PAST MEDICAL HISTORY: Past Medical History:  Diagnosis Date   Acid reflux    Anemia    Arthritis    Asthma    Cervical dysplasia    Diabetes mellitus    DVT (deep venous thrombosis) (HCC)    Gallbladder problem    GERD (gastroesophageal reflux disease)    Heart murmur    Hx of blood clots    Hyperlipidemia    Hypertension    LBP (low back pain)    Liver problem    Obesity    OSA (obstructive sleep apnea) 03/22/2018   New diagnosis. Mild.    Pulmonary embolism (HCC)     Sarcoidosis    Sleep apnea    Vitamin D deficiency     PAST SURGICAL HISTORY: Past Surgical History:  Procedure Laterality Date   BREAST BIOPSY Left 2019   BUNIONECTOMY     CHOLECYSTECTOMY     COLPOSCOPY     GYNECOLOGIC CRYOSURGERY     KNEE ARTHROSCOPY     TUBAL LIGATION      SOCIAL HISTORY: Social History   Tobacco Use   Smoking status: Never Smoker   Smokeless tobacco: Never Used  Substance Use Topics   Alcohol use: No   Drug use: No    FAMILY HISTORY: Family History  Problem Relation Age of Onset   Heart failure Mother 36       chf   Diabetes Mother    High blood pressure Mother    Heart disease  Mother    Sudden death Mother    Kidney disease Mother    Obesity Mother    Heart failure Father    Heart disease Father    Sudden death Father    Alcoholism Father    Cancer Brother 67       prostate   Hypertension Maternal Uncle    Hyperlipidemia Brother    Diabetes Brother    Colon cancer Maternal Grandfather    Colon polyps Brother    Diabetes Brother    Hyperlipidemia Brother    Stomach cancer Neg Hx    Esophageal cancer Neg Hx    Rectal cancer Neg Hx    Liver cancer Neg Hx    Breast cancer Neg Hx     ROS: Review of Systems  Constitutional: Negative for weight loss.  Cardiovascular: Negative for chest pain.  Gastrointestinal: Negative for diarrhea, nausea and vomiting.  Genitourinary:       Negative for UTI's  Musculoskeletal:       Negative for muscle weakness  Endo/Heme/Allergies:       Negative for polyphagia Negative for hypoglycemia    PHYSICAL EXAM: Blood pressure (!) 147/100, pulse 72, temperature 98.4 F (36.9 C), temperature source Oral, height 5\' 3"  (1.6 m), weight 250 lb (113.4 kg), SpO2 96 %. Body mass index is 44.29 kg/m. Physical Exam Vitals reviewed.  Constitutional:      General: She is not in acute distress.    Appearance: Normal appearance. She is well-developed. She is obese.    Cardiovascular:     Rate and Rhythm: Normal rate.  Pulmonary:     Effort: Pulmonary effort is normal.  Musculoskeletal:        General: Normal range of motion.  Skin:    General: Skin is warm and dry.  Neurological:     Mental Status: She is alert and oriented to person, place, and time.  Psychiatric:        Mood and Affect: Mood normal.        Behavior: Behavior normal.     RECENT LABS AND TESTS: BMET    Component Value Date/Time   NA 140 01/11/2019 1429   NA 139 09/15/2018 0845   K 4.1 01/11/2019 1429   CL 101 01/11/2019 1429   CO2 25 01/11/2019 1429   GLUCOSE 185 (H) 01/11/2019 1429   BUN 14 01/11/2019 1429   BUN 15 09/15/2018 0845   CREATININE 1.09 (H) 01/11/2019 1429   CREATININE 0.66 12/04/2016 0831   CALCIUM 9.8 01/11/2019 1429   GFRNONAA 57 (L) 01/11/2019 1429   GFRNONAA 101 12/04/2016 0831   GFRAA >60 01/11/2019 1429   GFRAA 117 12/04/2016 0831   Lab Results  Component Value Date   HGBA1C 9.5 (A) 12/30/2018   HGBA1C 10.5 (A) 11/04/2018   HGBA1C 11.1 (H) 08/18/2018   HGBA1C 10.5 (A) 03/11/2018   HGBA1C 8.4 (A) 01/08/2018   No results found for: INSULIN CBC    Component Value Date/Time   WBC 6.9 01/11/2019 1429   RBC 5.80 (H) 01/11/2019 1429   HGB 13.0 01/11/2019 1429   HGB 11.9 08/26/2018 1232   HCT 42.8 01/11/2019 1429   HCT 39.2 08/26/2018 1232   PLT 233 01/11/2019 1429   PLT 327 08/26/2018 1232   MCV 73.8 (L) 01/11/2019 1429   MCV 73 (L) 08/26/2018 1232   MCH 22.4 (L) 01/11/2019 1429   MCHC 30.4 01/11/2019 1429   RDW 16.4 (H) 01/11/2019 1429   RDW 15.8 (H) 08/26/2018  1232   LYMPHSABS 3.3 01/11/2019 1429   LYMPHSABS 2.6 08/26/2018 1232   MONOABS 0.5 01/11/2019 1429   EOSABS 0.0 01/11/2019 1429   EOSABS 0.1 08/26/2018 1232   BASOSABS 0.0 01/11/2019 1429   BASOSABS 0.1 08/26/2018 1232   Iron/TIBC/Ferritin/ %Sat    Component Value Date/Time   IRON 66 08/08/2015 0743   TIBC 420 08/08/2015 0743   IRONPCTSAT 16 08/08/2015 0743   Lipid  Panel     Component Value Date/Time   CHOL 142 09/15/2018 0845   TRIG 117 09/15/2018 0845   HDL 50 09/15/2018 0845   CHOLHDL 2.8 09/15/2018 0845   CHOLHDL 3.5 12/04/2016 0831   VLDL 27 01/23/2016 0937   LDLCALC 69 09/15/2018 0845   LDLCALC 121 (H) 12/04/2016 0831   Hepatic Function Panel     Component Value Date/Time   PROT 8.9 (H) 01/11/2019 1429   PROT 7.5 09/15/2018 0845   ALBUMIN 3.9 01/11/2019 1429   ALBUMIN 4.2 09/15/2018 0845   AST 34 01/11/2019 1429   ALT 41 01/11/2019 1429   ALKPHOS 134 (H) 01/11/2019 1429   BILITOT 0.2 (L) 01/11/2019 1429   BILIDIR 0.08 09/15/2018 0845      Component Value Date/Time   TSH 1.430 12/15/2018 0855   TSH 0.955 08/15/2018 1432   TSH 1.430 02/13/2018 1425   TSH 0.843 05/08/2017 0909     Ref. Range 12/15/2018 08:55  Vitamin D, 25-Hydroxy Latest Ref Range: 30.0 - 100.0 ng/mL 34.9    I, Doreene Nest, am acting as Location manager for Abby Potash, PA-C I, Abby Potash, PA-C have reviewed above note and agree with its content

## 2019-02-25 ENCOUNTER — Ambulatory Visit (INDEPENDENT_AMBULATORY_CARE_PROVIDER_SITE_OTHER): Payer: 59 | Admitting: Family Medicine

## 2019-02-25 ENCOUNTER — Telehealth: Payer: Self-pay | Admitting: Internal Medicine

## 2019-02-25 ENCOUNTER — Encounter: Payer: Self-pay | Admitting: Family Medicine

## 2019-02-25 ENCOUNTER — Other Ambulatory Visit: Payer: Self-pay

## 2019-02-25 VITALS — Wt 250.0 lb

## 2019-02-25 DIAGNOSIS — I10 Essential (primary) hypertension: Secondary | ICD-10-CM | POA: Diagnosis not present

## 2019-02-25 DIAGNOSIS — R42 Dizziness and giddiness: Secondary | ICD-10-CM | POA: Diagnosis not present

## 2019-02-25 DIAGNOSIS — Z7901 Long term (current) use of anticoagulants: Secondary | ICD-10-CM

## 2019-02-25 DIAGNOSIS — R1013 Epigastric pain: Secondary | ICD-10-CM

## 2019-02-25 DIAGNOSIS — R110 Nausea: Secondary | ICD-10-CM

## 2019-02-25 DIAGNOSIS — Z86711 Personal history of pulmonary embolism: Secondary | ICD-10-CM

## 2019-02-25 DIAGNOSIS — E119 Type 2 diabetes mellitus without complications: Secondary | ICD-10-CM

## 2019-02-25 NOTE — Telephone Encounter (Signed)
error 

## 2019-02-25 NOTE — Patient Instructions (Signed)
I recommend you take the ondansetron (zofran) to help with your nausea. Please stay well hydrated--please drink plenty of water/fluids, with goal of your urine being very light/clear. If your dizziness worsens, you may need to be evaluated in person over the weekend (urgent care or ER, depending on the severity). Go to the ER if you have worsening dizziness, fainting, chest pain, shortness of breath, black or bloody stools, unable to keep food/fluid down despite using your medications, fever, severe abdominal pain, or other concerns that cannot wait until Monday.  I recommend that you increase your omeprazole to 40mg  twice daily over the next week (until your upper stomach pain resolves).  If not getting worse (so no care needed over the weekend), but if you aren't feeling significantly better, please come and see [name] in the office next week.  It was a pleasure meeting you--I hope you feel better soon!

## 2019-02-25 NOTE — Progress Notes (Signed)
Start time: 11:58 End time: 12:24  Virtual Visit via Video Note  I connected with Victoria Padilla on 02/25/19 by a video enabled telemedicine application and verified that I am speaking with the correct person using two identifiers.  Location: Patient: home Provider: office   I discussed the limitations of evaluation and management by telemedicine and the availability of in person appointments. The patient expressed understanding and agreed to proceed.  History of Present Illness:  Chief Complaint  Patient presents with  . light headed    lightheaded, dizzy, nauseated and groin pain. started monday   She saw [name] on 12/17.  At that time she had pain in left lower back and L abdomen/groin. She reports she was sent for x-rays and had labs.  These were reviewed and normal.  Monday of this week--she felt lightheaded, weak, stomach hurting more, and a headache.  Back has improved. Pain in stomach is in upper stomach, and she also feels nauseated.   No change since Monday.  Dizziness persists, feels like she could pass out. Feels worse in the morning.  Takes omeprazole 40mg  once daily, hasn't missed doses. No change in diet--protein diet.  She took tylenol last week (for the back pain), can't take advil, on xarelto.  She has a complex history, including diabetes, hypertension, h/o PE/DVT's and is on xarelto.  She is under the care of Dr. Loanne Drilling for her DM. BP 123/72 today. Sugars 159 today. Nothing over 200  Lab Results  Component Value Date   HGBA1C 9.5 (A) 12/30/2018   PMH, PSH, SH reviewed  Outpatient Encounter Medications as of 02/25/2019  Medication Sig  . amLODipine (NORVASC) 5 MG tablet TAKE 1 TABLET BY MOUTH EVERY DAY  . B-D ULTRAFINE III SHORT PEN 31G X 8 MM MISC USE TO INJECT BASAGLAR DAILY  . benazepril (LOTENSIN) 40 MG tablet TAKE 1 TABLET BY MOUTH EVERY DAY  . cetirizine (ZYRTEC) 10 MG tablet Take 10 mg by mouth daily.  . cholecalciferol (VITAMIN D) 1000 units  tablet Take 2,000 Units by mouth at bedtime.   . dapagliflozin propanediol (FARXIGA) 10 MG TABS tablet Take 10 mg by mouth daily before breakfast.  . glucose blood (ONETOUCH VERIO) test strip Use as instructed  . hydrochlorothiazide (HYDRODIURIL) 25 MG tablet Take 1 tablet (25 mg total) by mouth daily.  . metFORMIN (GLUCOPHAGE) 500 MG tablet Take 1 tablet (500 mg total) by mouth daily with breakfast.  . metoprolol tartrate (LOPRESSOR) 25 MG tablet Take 1 tablet (25 mg total) by mouth 2 (two) times daily.  . Omega-3 Fatty Acids (FISH OIL PO) Take 1 capsule by mouth at bedtime. Reported on 08/08/2015  . omeprazole (PRILOSEC) 40 MG capsule TAKE 1 CAPSULE BY MOUTH EVERY DAY  . ondansetron (ZOFRAN) 4 MG tablet Take 4 mg by mouth every 8 (eight) hours as needed for nausea or vomiting.  . rivaroxaban (XARELTO) 20 MG TABS tablet Take 1 tablet (20 mg total) by mouth daily with supper.  . simvastatin (ZOCOR) 20 MG tablet Take 20 mg by mouth daily.  . Vitamin D, Ergocalciferol, (DRISDOL) 1.25 MG (50000 UT) CAPS capsule Take 1 capsule (50,000 Units total) by mouth every 7 (seven) days.  Marland Kitchen glucose blood (FREESTYLE LITE) test strip 1 each by Other route 4 (four) times daily. And lancets 4/day.   No facility-administered encounter medications on file as of 02/25/2019.   Allergies  Allergen Reactions  . Naprosyn [Naproxen] Nausea Only   ROS:  No fever, chills.  No  diarrhea, but more bowel movements than usual today.  Denies blood in stool, does see mucus in stool (comes and goes). Denies melena. (just in June, when diagnosed with PE).  Some cramping near her rectal area, feels like what she would get with menses.  No period for a year. No urinary complaints. No chest pain, shortness of breath. No URI symptoms, cough Some sneezing. No loss of smell, things taste a little different.  No COVID exposures.  Observations/Objective:  Wt 250 lb (113.4 kg)   BMI 44.29 kg/m   BP 123/72 on home monitor  today. She is alert, oriented, well appearing.  She is in no distress Cranial nerves are grossly intact. Normal eye contact, speech, normal mood. Exam is limited due to virtual nature of the visit. Area of discomfort (and is tender) is in epigastrium  Lab Results  Component Value Date   WBC 6.7 02/11/2019   HGB 12.8 02/11/2019   HCT 39.7 02/11/2019   MCV 71 (L) 02/11/2019   PLT 204 02/11/2019    Assessment and Plan:  Dizziness - BP and glu okay. given nausea, concern for dehydration. To hydrate well. May need ER if worsening  Nausea - along with epigastric pain. Ddx reviewed, poss gastritis. Increase omeprazole to 40mg  BID for the week, and use zofran prn  Epigastric pain - advised to take PPI BID for a week  Essential hypertension - controlled  Type 2 diabetes mellitus without complication, without long-term current use of insulin (HCC) - not well controlled, under care of endo.  Sugars not likely contributing to her symptoms today  History of pulmonary embolus (PE) - denies CP/SOB, compliant with xarelto  Current use of anticoagulant therapy - no e/o bleeding per history.  last CBC 2 weeks ago was fine    Follow Up Instructions:    I discussed the assessment and treatment plan with the patient. The patient was provided an opportunity to ask questions and all were answered. The patient agreed with the plan and demonstrated an understanding of the instructions.   The patient was advised to call back or seek an in-person evaluation if the symptoms worsen or if the condition fails to improve as anticipated.  I provided 26 minutes of non-face-to-face time during this encounter.   Vikki Ports, MD

## 2019-02-26 ENCOUNTER — Other Ambulatory Visit: Payer: Self-pay | Admitting: Family Medicine

## 2019-02-26 DIAGNOSIS — I152 Hypertension secondary to endocrine disorders: Secondary | ICD-10-CM

## 2019-02-26 DIAGNOSIS — E1159 Type 2 diabetes mellitus with other circulatory complications: Secondary | ICD-10-CM

## 2019-03-03 ENCOUNTER — Encounter (INDEPENDENT_AMBULATORY_CARE_PROVIDER_SITE_OTHER): Payer: Self-pay | Admitting: Physician Assistant

## 2019-03-03 ENCOUNTER — Other Ambulatory Visit: Payer: Self-pay

## 2019-03-03 ENCOUNTER — Ambulatory Visit (INDEPENDENT_AMBULATORY_CARE_PROVIDER_SITE_OTHER): Payer: 59 | Admitting: Physician Assistant

## 2019-03-03 VITALS — BP 139/78 | HR 92 | Temp 98.4°F | Ht 63.0 in | Wt 251.0 lb

## 2019-03-03 DIAGNOSIS — E559 Vitamin D deficiency, unspecified: Secondary | ICD-10-CM | POA: Diagnosis not present

## 2019-03-03 DIAGNOSIS — E119 Type 2 diabetes mellitus without complications: Secondary | ICD-10-CM

## 2019-03-03 DIAGNOSIS — Z6841 Body Mass Index (BMI) 40.0 and over, adult: Secondary | ICD-10-CM | POA: Diagnosis not present

## 2019-03-05 ENCOUNTER — Ambulatory Visit: Payer: 59 | Admitting: Endocrinology

## 2019-03-07 ENCOUNTER — Other Ambulatory Visit: Payer: Self-pay | Admitting: Family Medicine

## 2019-03-07 DIAGNOSIS — K219 Gastro-esophageal reflux disease without esophagitis: Secondary | ICD-10-CM

## 2019-03-09 NOTE — Progress Notes (Signed)
Chief Complaint:   OBESITY Victoria Padilla is here to discuss her progress with her obesity treatment plan along with follow-up of her obesity related diagnoses. Victoria Padilla is on the Category 4 Plan and states she is following her eating plan approximately 30% of the time. Victoria Padilla states she is exercising 0 minutes 0 times per week.  Today's visit was #: 5 Starting weight: 251 lbs Starting date: 12/07/2018 Today's weight: 251 lbs Today's date: 03/03/2019 Total lbs lost to date: 0 Total lbs lost since last in-office visit: 0  Interim History: Victoria Padilla reports that she was slightly off track during the holidays. She struggles with dinner, because she cooks for her kids as well.  Subjective:   Vitamin D deficiency Victoria Padilla is on vitamin D and her last vitamin D level was at 34.9 (12/15/18). She has no nausea, vomiting or muscle weakness.  Diabetes II Victoria Padilla is on metformin and Victoria Padilla. She has no nausea, vomiting, diarrhea or UTI. Her fasting blood sugars range between 150 and 199. Her last A1c was 9.5 (12/30/18).  Assessment/Plan:   Vitamin D deficiency Low Vitamin D level contributes to fatigue and are associated with obesity, breast, and colon cancer. Ziham will continue to take prescription Vitamin D @50 ,000 IU every week and she will follow-up for routine testing of vitamin D, at least 2-3 times per year to avoid over-replacement.  Diabetes II Victoria Padilla has been given diabetes education by myself today. Good blood sugar control is important to decrease the likelihood of diabetic complications such as nephropathy, neuropathy, limb loss, blindness, coronary artery disease, and death. Intensive lifestyle modification including diet, exercise and weight loss were discussed as the first line treatment for diabetes. Victoria Padilla will continue with medications and weight loss.  Obesity Victoria Padilla is currently in the action stage of change. As such, her goal is to continue with weight loss efforts. She has agreed to on the  Category 4 Plan.   We discussed the following exercise goals today: For substantial health benefits, adults should do at least 150 minutes (2 hours and 30 minutes) a week of moderate-intensity, or 75 minutes (1 hour and 15 minutes) a week of vigorous-intensity aerobic physical activity, or an equivalent combination of moderate- and vigorous-intensity aerobic activity. Aerobic activity should be performed in episodes of at least 10 minutes, and preferably, it should be spread throughout the week. Adults should also include muscle-strengthening activities that involve all major muscle groups on 2 or more days a week.  We discussed the following behavioral modification strategies today: increasing lean protein intake and no skipping meals.  Victoria Padilla has agreed to follow-up with our clinic in 2 weeks. She was informed of the importance of frequent follow-up visits to maximize her success with intensive lifestyle modifications for her multiple health conditions.   Objective:   Blood pressure 139/78, pulse 92, temperature 98.4 F (36.9 C), temperature source Oral, height 5\' 3"  (1.6 m), weight 251 lb (113.9 kg), SpO2 96 %. Body mass index is 44.46 kg/m.  General: Cooperative, alert, well developed, in no acute distress. HEENT: Conjunctivae and lids unremarkable. Neck: No thyromegaly.  Cardiovascular: Regular rhythm.  Lungs: Normal work of breathing. Extremities: No edema.  Neurologic: No focal deficits.   Lab Results  Component Value Date   CREATININE 0.92 02/11/2019   BUN 14 02/11/2019   NA 138 02/11/2019   K 4.3 02/11/2019   CL 100 02/11/2019   CO2 33 (H) 02/11/2019   Lab Results  Component Value Date  ALT 32 02/11/2019   AST 29 02/11/2019   ALKPHOS 140 (H) 02/11/2019   BILITOT 0.3 02/11/2019   Lab Results  Component Value Date   HGBA1C 9.5 (A) 12/30/2018   HGBA1C 10.5 (A) 11/04/2018   HGBA1C 11.1 (H) 08/18/2018   HGBA1C 10.5 (A) 03/11/2018   HGBA1C 8.4 (A) 01/08/2018    No results found for: INSULIN Lab Results  Component Value Date   TSH 1.430 12/15/2018   Lab Results  Component Value Date   CHOL 142 09/15/2018   HDL 50 09/15/2018   LDLCALC 69 09/15/2018   TRIG 117 09/15/2018   CHOLHDL 2.8 09/15/2018   Lab Results  Component Value Date   WBC 6.7 02/11/2019   HGB 12.8 02/11/2019   HCT 39.7 02/11/2019   MCV 71 (L) 02/11/2019   PLT 204 02/11/2019   Lab Results  Component Value Date   IRON 66 08/08/2015   TIBC 420 08/08/2015    Ref. Range 12/15/2018 08:55  Vitamin D, 25-Hydroxy Latest Ref Range: 30.0 - 100.0 ng/mL 34.9    Attestation Statements:   Reviewed by clinician on day of visit: allergies, medications, problem list, medical history, surgical history, family history, social history, and previous encounter notes.  Time spent on visit including pre-visit chart review and post-visit care was 31 minutes.   Corey Skains, am acting as Location manager for [employer], PA-C.  I have reviewed the above documentation for accuracy and completeness, and I agree with the above. Abby Potash, PA-C

## 2019-03-10 ENCOUNTER — Other Ambulatory Visit: Payer: Self-pay | Admitting: Endocrinology

## 2019-03-10 NOTE — Telephone Encounter (Signed)
Please advise. Back in May, pt stated she was no longer taking this medication. Refill?

## 2019-03-16 ENCOUNTER — Ambulatory Visit (INDEPENDENT_AMBULATORY_CARE_PROVIDER_SITE_OTHER): Payer: 59 | Admitting: Physician Assistant

## 2019-03-16 ENCOUNTER — Other Ambulatory Visit: Payer: Self-pay

## 2019-03-16 ENCOUNTER — Encounter (INDEPENDENT_AMBULATORY_CARE_PROVIDER_SITE_OTHER): Payer: Self-pay | Admitting: Physician Assistant

## 2019-03-16 VITALS — BP 148/83 | HR 81 | Temp 98.1°F | Ht 63.0 in | Wt 254.0 lb

## 2019-03-16 DIAGNOSIS — E559 Vitamin D deficiency, unspecified: Secondary | ICD-10-CM

## 2019-03-16 DIAGNOSIS — E119 Type 2 diabetes mellitus without complications: Secondary | ICD-10-CM

## 2019-03-16 DIAGNOSIS — Z9189 Other specified personal risk factors, not elsewhere classified: Secondary | ICD-10-CM

## 2019-03-16 DIAGNOSIS — Z6841 Body Mass Index (BMI) 40.0 and over, adult: Secondary | ICD-10-CM

## 2019-03-16 MED ORDER — VITAMIN D (ERGOCALCIFEROL) 1.25 MG (50000 UNIT) PO CAPS: 50000.0000 [IU] | ORAL_CAPSULE | ORAL | 0 refills | Status: DC

## 2019-03-17 NOTE — Progress Notes (Signed)
Chief Complaint:   OBESITY Victoria Padilla is here to discuss her progress with her obesity treatment plan along with follow-up of her obesity related diagnoses. Km. is on the Category 4 Plan and states she is following her eating plan approximately 30% of the time. Victoria Padilla states she is doing 0 minutes 0 times per week.  Today's visit was #: 6 Starting weight: 251 lbs Starting date: 12/07/2018 Today's weight: 254 lbs Today's date: 03/17/2019 Total lbs lost to date: 0 Total lbs lost since last in-office visit: 0  Interim History: Victoria Padilla reports that she continues to struggle with getting enough protein in at dinner. She is not weighing her protein.  Subjective:   1. Vitamin D deficiency Victoria Padilla is on Vit D weekly and denies nausea, vomiting, or muscle weakness. Her last Vit D level was 34.9.  2. Type 2 diabetes mellitus without complication, without long-term current use of insulin (HCC) Victoria Padilla's fasting blood sugars range between 180 and 294. She is followed by Dr. Loanne Drilling, Endocrinologist. She is on [name] and metformin. She denies nausea, vomiting, or diarrhea. She denies UTI's or yeast infections.  3. At risk for heart disease Victoria Padilla is at a higher than average risk for cardiovascular disease due to obesity. Reviewed: no chest pain on exertion, no dyspnea on exertion, and no swelling of ankles.  Assessment/Plan:   1. Vitamin D deficiency Low Vitamin D level contributes to fatigue and are associated with obesity, breast, and colon cancer. We will refill prescription Vit D for 1 month. Victoria Padilla will follow-up for routine testing of Vitamin D, at least 2-3 times per year to avoid over-replacement.  - Vitamin D, Ergocalciferol, (DRISDOL) 1.25 MG (50000 UNIT) CAPS capsule; Take 1 capsule (50,000 Units total) by mouth every 7 (seven) days.  Dispense: 4 capsule; Refill: 0  2. Type 2 diabetes mellitus without complication, without long-term current use of insulin (HCC) Good blood sugar control is  important to decrease the likelihood of diabetic complications such as nephropathy, neuropathy, limb loss, blindness, coronary artery disease, and death. Intensive lifestyle modification including diet, exercise and weight loss are the first line of treatment for diabetes. Victoria Padilla agrees to continue her medications, and will continue with weight loss.  3. At risk for heart disease Victoria Padilla was given approximately 15 minutes of coronary artery disease prevention counseling today. She is 54 y.o. female and has risk factors for heart disease including obesity. We discussed intensive lifestyle modifications today with an emphasis on specific weight loss instructions and strategies.   Repetitive spaced learning was employed today to elicit superior memory formation and behavioral change.  4. Class 3 severe obesity with serious comorbidity and body mass index (BMI) of 45.0 to 49.9 in adult, unspecified obesity type (HCC) Victoria Padilla is currently in the action stage of change. As such, her goal is to continue with weight loss efforts. She has agreed to the Category 4 Plan.   Exercise goals: For substantial health benefits, adults should do at least 150 minutes (2 hours and 30 minutes) a week of moderate-intensity, or 75 minutes (1 hour and 15 minutes) a week of vigorous-intensity aerobic physical activity, or an equivalent combination of moderate- and vigorous-intensity aerobic activity. Aerobic activity should be performed in episodes of at least 10 minutes, and preferably, it should be spread throughout the week. Adults should also include muscle-strengthening activities that involve all major muscle groups on 2 or more days a week.  Behavioral modification strategies: meal planning and cooking strategies and keeping healthy foods  in the home.  Victoria Padilla has agreed to follow-up with our clinic in 2 weeks. She was informed of the importance of frequent follow-up visits to maximize her success with intensive lifestyle  modifications for her multiple health conditions.   Objective:   Blood pressure (!) 148/83, pulse 81, temperature 98.1 F (36.7 C), temperature source Oral, height 5\' 3"  (1.6 m), weight 254 lb (115.2 kg), SpO2 97 %. Body mass index is 44.99 kg/m.  General: Cooperative, alert, well developed, in no acute distress. HEENT: Conjunctivae and lids unremarkable. Cardiovascular: Regular rhythm.  Lungs: Normal work of breathing. Neurologic: No focal deficits.   Lab Results  Component Value Date   CREATININE 0.92 02/11/2019   BUN 14 02/11/2019   NA 138 02/11/2019   K 4.3 02/11/2019   CL 100 02/11/2019   CO2 33 (H) 02/11/2019   Lab Results  Component Value Date   ALT 32 02/11/2019   AST 29 02/11/2019   ALKPHOS 140 (H) 02/11/2019   BILITOT 0.3 02/11/2019   Lab Results  Component Value Date   HGBA1C 9.5 (A) 12/30/2018   HGBA1C 10.5 (A) 11/04/2018   HGBA1C 11.1 (H) 08/18/2018   HGBA1C 10.5 (A) 03/11/2018   HGBA1C 8.4 (A) 01/08/2018   No results found for: INSULIN Lab Results  Component Value Date   TSH 1.430 12/15/2018   Lab Results  Component Value Date   CHOL 142 09/15/2018   HDL 50 09/15/2018   LDLCALC 69 09/15/2018   TRIG 117 09/15/2018   CHOLHDL 2.8 09/15/2018   Lab Results  Component Value Date   WBC 6.7 02/11/2019   HGB 12.8 02/11/2019   HCT 39.7 02/11/2019   MCV 71 (L) 02/11/2019   PLT 204 02/11/2019   Lab Results  Component Value Date   IRON 66 08/08/2015   TIBC 420 08/08/2015   Attestation Statements:   Reviewed by clinician on day of visit: allergies, medications, problem list, medical history, surgical history, family history, social history, and previous encounter notes.   Wilhemena Durie, am acting as transcriptionist for [employer], PA-C.  I have reviewed the above documentation for accuracy and completeness, and I agree with the above. Abby Potash, PA-C

## 2019-03-23 ENCOUNTER — Other Ambulatory Visit: Payer: Self-pay

## 2019-03-23 DIAGNOSIS — E119 Type 2 diabetes mellitus without complications: Secondary | ICD-10-CM

## 2019-03-23 MED ORDER — FARXIGA 10 MG PO TABS: 10.0000 mg | ORAL_TABLET | Freq: Every day | ORAL | 0 refills | Status: DC

## 2019-03-31 ENCOUNTER — Encounter (INDEPENDENT_AMBULATORY_CARE_PROVIDER_SITE_OTHER): Payer: Self-pay | Admitting: Physician Assistant

## 2019-03-31 ENCOUNTER — Ambulatory Visit (INDEPENDENT_AMBULATORY_CARE_PROVIDER_SITE_OTHER): Payer: 59 | Admitting: Physician Assistant

## 2019-03-31 ENCOUNTER — Other Ambulatory Visit: Payer: Self-pay

## 2019-03-31 VITALS — BP 148/84 | HR 86 | Temp 97.9°F | Ht 63.0 in | Wt 256.0 lb

## 2019-03-31 DIAGNOSIS — E119 Type 2 diabetes mellitus without complications: Secondary | ICD-10-CM | POA: Diagnosis not present

## 2019-03-31 DIAGNOSIS — Z6841 Body Mass Index (BMI) 40.0 and over, adult: Secondary | ICD-10-CM

## 2019-04-01 NOTE — Progress Notes (Signed)
Chief Complaint:   Victoria Padilla is here to discuss her progress with her Victoria treatment plan along with follow-up of her Victoria related diagnoses. Victoria Padilla is on the Category 4 Plan and states she is following her eating plan approximately 50-60% of the time. Victoria Padilla states she is walking 60 minutes 2 times per week.  Today's visit was #: 7 Starting weight: 251 lbs Starting date: 12/07/2018 Today's weight: 256 lbs Today's date: 03/31/2019 Total lbs lost to date: 0 Total lbs lost since last in-office visit: 0  Interim History: Victoria Padilla reports that she had a couple of days that she did not eat much due to her menstrual cycle. She ran out of [place] milk over the weekend.  Subjective:   Type 2 diabetes mellitus without complication, without long-term current use of insulin (Victoria Padilla). Victoria Padilla is on metformin and [name]. She states fasting blood sugars range between 154 and 254. No hypoglycemia. No polyphagia.  Lab Results  Component Value Date   HGBA1C 9.5 (A) 12/30/2018   HGBA1C 10.5 (A) 11/04/2018   HGBA1C 11.1 (H) 08/18/2018   Lab Results  Component Value Date   MICROALBUR 12.4 12/04/2016   LDLCALC 69 09/15/2018   CREATININE 0.92 02/11/2019   No results found for: INSULIN  Assessment/Plan:   Type 2 diabetes mellitus without complication, without long-term current use of insulin ([place]). Good blood sugar control is important to decrease the likelihood of diabetic complications such as nephropathy, neuropathy, limb loss, blindness, coronary artery disease, and death. Intensive lifestyle modification including diet, exercise and weight loss are the first line of treatment for diabetes. Victoria Padilla will continue her medications as prescribed.  Class 3 severe Victoria with serious comorbidity and body mass index (BMI) of 45.0 to 49.9 in adult, unspecified Victoria type ([place]).  Victoria Padilla is currently in the action stage of change. As such, her goal is to continue with weight loss efforts. She  has agreed to the Category 4 Plan.   Exercise goals: For substantial health benefits, adults should do at least 150 minutes (2 hours and 30 minutes) a week of moderate-intensity, or 75 minutes (1 hour and 15 minutes) a week of vigorous-intensity aerobic physical activity, or an equivalent combination of moderate- and vigorous-intensity aerobic activity. Aerobic activity should be performed in episodes of at least 10 minutes, and preferably, it should be spread throughout the week.  Behavioral modification strategies: no skipping meals and meal planning and cooking strategies.  Victoria Padilla has agreed to follow-up with our clinic in 2 weeks. She was informed of the importance of frequent follow-up visits to maximize her success with intensive lifestyle modifications for her multiple health conditions.   Objective:   Blood pressure (!) 148/84, pulse 86, temperature 97.9 F (36.6 C), temperature source Oral, height 5\' 3"  (1.6 m), weight 256 lb (116.1 kg), SpO2 97 %. Body mass index is 45.35 kg/m.  General: Cooperative, alert, well developed, in no acute distress. HEENT: Conjunctivae and lids unremarkable. Cardiovascular: Regular rhythm.  Lungs: Normal work of breathing. Neurologic: No focal deficits.   Lab Results  Component Value Date   CREATININE 0.92 02/11/2019   BUN 14 02/11/2019   NA 138 02/11/2019   K 4.3 02/11/2019   CL 100 02/11/2019   CO2 33 (H) 02/11/2019   Lab Results  Component Value Date   ALT 32 02/11/2019   AST 29 02/11/2019   ALKPHOS 140 (H) 02/11/2019   BILITOT 0.3 02/11/2019   Lab Results  Component Value Date  HGBA1C 9.5 (A) 12/30/2018   HGBA1C 10.5 (A) 11/04/2018   HGBA1C 11.1 (H) 08/18/2018   HGBA1C 10.5 (A) 03/11/2018   HGBA1C 8.4 (A) 01/08/2018   No results found for: INSULIN Lab Results  Component Value Date   TSH 1.430 12/15/2018   Lab Results  Component Value Date   CHOL 142 09/15/2018   HDL 50 09/15/2018   LDLCALC 69 09/15/2018   TRIG 117  09/15/2018   CHOLHDL 2.8 09/15/2018   Lab Results  Component Value Date   WBC 6.7 02/11/2019   HGB 12.8 02/11/2019   HCT 39.7 02/11/2019   MCV 71 (L) 02/11/2019   PLT 204 02/11/2019   Lab Results  Component Value Date   IRON 66 08/08/2015   TIBC 420 08/08/2015   Attestation Statements:   Reviewed by clinician on day of visit: allergies, medications, problem list, medical history, surgical history, family history, social history, and previous encounter notes.  Time spent on visit including pre-visit chart review and post-visit care was 31 minutes.   IMichaelene Song, am acting as transcriptionist for Abby Potash, PA-C   I have reviewed the above documentation for accuracy and completeness, and I agree with the above. Abby Potash, PA-C

## 2019-04-14 ENCOUNTER — Ambulatory Visit (INDEPENDENT_AMBULATORY_CARE_PROVIDER_SITE_OTHER): Payer: 59 | Admitting: Physician Assistant

## 2019-04-22 ENCOUNTER — Ambulatory Visit (INDEPENDENT_AMBULATORY_CARE_PROVIDER_SITE_OTHER): Payer: 59 | Admitting: Family Medicine

## 2019-04-22 ENCOUNTER — Encounter (INDEPENDENT_AMBULATORY_CARE_PROVIDER_SITE_OTHER): Payer: Self-pay | Admitting: Family Medicine

## 2019-04-22 ENCOUNTER — Other Ambulatory Visit: Payer: Self-pay

## 2019-04-22 VITALS — BP 132/84 | HR 107 | Temp 98.3°F | Ht 63.0 in | Wt 258.0 lb

## 2019-04-22 DIAGNOSIS — Z9189 Other specified personal risk factors, not elsewhere classified: Secondary | ICD-10-CM

## 2019-04-22 DIAGNOSIS — E119 Type 2 diabetes mellitus without complications: Secondary | ICD-10-CM | POA: Diagnosis not present

## 2019-04-22 DIAGNOSIS — I10 Essential (primary) hypertension: Secondary | ICD-10-CM

## 2019-04-22 DIAGNOSIS — E559 Vitamin D deficiency, unspecified: Secondary | ICD-10-CM | POA: Diagnosis not present

## 2019-04-22 DIAGNOSIS — Z6841 Body Mass Index (BMI) 40.0 and over, adult: Secondary | ICD-10-CM

## 2019-04-22 MED ORDER — VITAMIN D (ERGOCALCIFEROL) 1.25 MG (50000 UNIT) PO CAPS: 50000.0000 [IU] | ORAL_CAPSULE | ORAL | 0 refills | Status: DC

## 2019-04-26 NOTE — Progress Notes (Signed)
Chief Complaint:   OBESITY Victoria Padilla is here to discuss her progress with her obesity treatment plan along with follow-up of her obesity related diagnoses. Victoria Padilla is on the Category 4 Plan and states she is following her eating plan approximately 80% of the time. Victoria Padilla states she is doing 0 minutes 0 times per week.  Today's visit was #: 8 Starting weight: 251 lbs Starting date: 12/07/2018 Today's weight: 258 lbs Today's date: 04/22/2019 Total lbs lost to date: 0 Total lbs lost since last in-office visit: 0  Interim History: Victoria Padilla notes for breakfast she has 3 eggs and sausage, for lunch she has bread, 4 oz of meat, and milk. For dinner, she is not measuring but she is consistent. She is traveling to [place] for 2 weeks and she is leaving in 2 days. While there, she would like to follow the plan.  Subjective:   1. Type 2 diabetes mellitus without complication, without long-term current use of insulin (HCC) Victoria Padilla's highest blood sugar is 174. She reports that she has had no blood sugar >200. She denies hypoglycemia except when she was late eating 2 times.  2. Vitamin D deficiency Victoria Padilla denies nausea, vomiting, or muscle weakness, but notes fatigue. She is on prescription Vit D. Last Vit D level was 34.9 on 12/15/2018.  3. Essential hypertension Victoria Padilla's blood pressure is controlled today. She denies chest pain, chest pressure, or headaches.  4. At risk for heart disease Victoria Padilla is at a higher than average risk for cardiovascular disease due to obesity. Reviewed: no chest pain on exertion, no dyspnea on exertion, and no swelling of ankles.  Assessment/Plan:   1. Type 2 diabetes mellitus without complication, without long-term current use of insulin (HCC) Good blood sugar control is important to decrease the likelihood of diabetic complications such as nephropathy, neuropathy, limb loss, blindness, coronary artery disease, and death. Intensive lifestyle modification including diet, exercise and  weight loss are the first line of treatment for diabetes. Victoria Padilla agreed to continue her current medications, no change in dosage. We will continue to monitor.  2. Vitamin D deficiency Low Vitamin D level contributes to fatigue and are associated with obesity, breast, and colon cancer. We will refill prescription Vitamin D for 1 month. Victoria Padilla will follow-up for routine testing of Vitamin D, at least 2-3 times per year to avoid over-replacement.  - Vitamin D, Ergocalciferol, (DRISDOL) 1.25 MG (50000 UNIT) CAPS capsule; Take 1 capsule (50,000 Units total) by mouth every 7 (seven) days.  Dispense: 4 capsule; Refill: 0  3. Essential hypertension Victoria Padilla is working on healthy weight loss and exercise to improve blood pressure control. We will watch for signs of hypotension as she continues her lifestyle modifications. Victoria Padilla agreed to continue her current medications, no change in dosage. We will continue to monitor.  4. At risk for heart disease Victoria Padilla was given approximately 15 minutes of coronary artery disease prevention counseling today. She is 54 y.o. female and has risk factors for heart disease including obesity. We discussed intensive lifestyle modifications today with an emphasis on specific weight loss instructions and strategies.   Repetitive spaced learning was employed today to elicit superior memory formation and behavioral change.  5. Class 3 severe obesity with serious comorbidity and body mass index (BMI) of 45.0 to 49.9 in adult, unspecified obesity type (HCC) Victoria Padilla is currently in the action stage of change. As such, her goal is to continue with weight loss efforts. She has agreed to the Category 4 Plan.  Exercise goals: No exercise has been prescribed at this time.  Behavioral modification strategies: increasing lean protein intake, increasing vegetables, meal planning and cooking strategies, keeping healthy foods in the home and planning for success.  Victoria Padilla has agreed to follow-up with our  clinic in 3 weeks. She was informed of the importance of frequent follow-up visits to maximize her success with intensive lifestyle modifications for her multiple health conditions.   Objective:   Blood pressure 132/84, pulse (!) 107, temperature 98.3 F (36.8 C), temperature source Oral, height 5\' 3"  (1.6 m), weight 258 lb (117 kg), last menstrual period 03/22/2019, SpO2 96 %. Body mass index is 45.7 kg/m.  General: Cooperative, alert, well developed, in no acute distress. HEENT: Conjunctivae and lids unremarkable. Cardiovascular: Regular rhythm.  Lungs: Normal work of breathing. Neurologic: No focal deficits.   Lab Results  Component Value Date   CREATININE 0.92 02/11/2019   BUN 14 02/11/2019   NA 138 02/11/2019   K 4.3 02/11/2019   CL 100 02/11/2019   CO2 33 (H) 02/11/2019   Lab Results  Component Value Date   ALT 32 02/11/2019   AST 29 02/11/2019   ALKPHOS 140 (H) 02/11/2019   BILITOT 0.3 02/11/2019   Lab Results  Component Value Date   HGBA1C 9.5 (A) 12/30/2018   HGBA1C 10.5 (A) 11/04/2018   HGBA1C 11.1 (H) 08/18/2018   HGBA1C 10.5 (A) 03/11/2018   HGBA1C 8.4 (A) 01/08/2018   No results found for: INSULIN Lab Results  Component Value Date   TSH 1.430 12/15/2018   Lab Results  Component Value Date   CHOL 142 09/15/2018   HDL 50 09/15/2018   LDLCALC 69 09/15/2018   TRIG 117 09/15/2018   CHOLHDL 2.8 09/15/2018   Lab Results  Component Value Date   WBC 6.7 02/11/2019   HGB 12.8 02/11/2019   HCT 39.7 02/11/2019   MCV 71 (L) 02/11/2019   PLT 204 02/11/2019   Lab Results  Component Value Date   IRON 66 08/08/2015   TIBC 420 08/08/2015   Attestation Statements:   Reviewed by clinician on day of visit: allergies, medications, problem list, medical history, surgical history, family history, social history, and previous encounter notes.   I, Trixie Dredge, am acting as transcriptionist for Ilene Qua, MD.  I have reviewed the above  documentation for accuracy and completeness, and I agree with the above. - Ilene Qua, MD

## 2019-05-11 ENCOUNTER — Other Ambulatory Visit (INDEPENDENT_AMBULATORY_CARE_PROVIDER_SITE_OTHER): Payer: Self-pay | Admitting: Family Medicine

## 2019-05-11 DIAGNOSIS — E559 Vitamin D deficiency, unspecified: Secondary | ICD-10-CM

## 2019-05-13 ENCOUNTER — Telehealth (INDEPENDENT_AMBULATORY_CARE_PROVIDER_SITE_OTHER): Payer: 59 | Admitting: Family Medicine

## 2019-06-07 ENCOUNTER — Other Ambulatory Visit: Payer: Self-pay | Admitting: Family Medicine

## 2019-06-07 ENCOUNTER — Other Ambulatory Visit (INDEPENDENT_AMBULATORY_CARE_PROVIDER_SITE_OTHER): Payer: Self-pay | Admitting: Family Medicine

## 2019-06-07 DIAGNOSIS — E559 Vitamin D deficiency, unspecified: Secondary | ICD-10-CM

## 2019-06-15 ENCOUNTER — Ambulatory Visit (HOSPITAL_COMMUNITY)
Admission: EM | Admit: 2019-06-15 | Discharge: 2019-06-15 | Disposition: A | Discharge status: Home / Self Care | Payer: 59 | Attending: Family Medicine | Admitting: Family Medicine

## 2019-06-15 ENCOUNTER — Other Ambulatory Visit: Payer: Self-pay

## 2019-06-15 ENCOUNTER — Encounter (HOSPITAL_COMMUNITY): Payer: Self-pay | Admitting: Family Medicine

## 2019-06-15 DIAGNOSIS — G5602 Carpal tunnel syndrome, left upper limb: Secondary | ICD-10-CM

## 2019-06-15 MED ORDER — TIZANIDINE HCL 4 MG PO CAPS: 4.0000 mg | ORAL_CAPSULE | Freq: Three times a day (TID) | ORAL | 0 refills | Status: DC

## 2019-06-15 MED ORDER — METHYLPREDNISOLONE SODIUM SUCC 125 MG IJ SOLR
125.0000 mg | Freq: Once | INTRAMUSCULAR | Status: AC
  Administered 2019-06-15: 18:00:00 125 mg via INTRAMUSCULAR

## 2019-06-15 MED ORDER — METHYLPREDNISOLONE SODIUM SUCC 125 MG IJ SOLR
INTRAMUSCULAR | Status: AC
  Filled 2019-06-15: qty 2

## 2019-06-15 NOTE — ED Triage Notes (Signed)
Pt c/o 10/10 throbbing pain in let armx1 mo. Pt denies injury. Pt has 2+ left pedal pulse, cap refill less than 3 sec, 5/5 left grip strength, warm to touch, sensation intact.

## 2019-06-15 NOTE — Discharge Instructions (Addendum)
You received a prednisone injection to decrease the inflammation causing pain today at urgent care. I have prescribed tizanidine 4 mg 3 times daily as needed for pain.  Education can cause drowsiness therefore take caution while taking medication. I would recommend wearing the left wrist splint during hours while awake as compression helps with inflammation causing the pain to the wrist. If symptoms worsen or do not significantly improve within the next 7 days I would follow-up with Ortho care for further evaluation.

## 2019-06-15 NOTE — ED Provider Notes (Signed)
[place]    CSN: [id number] Arrival date & time: 06/15/19  1702      History   Chief Complaint Chief Complaint  Patient presents with  . Arm Pain    HPI Victoria Padilla is a 54 y.o. female.   HPI Patient with a history significant for type 2 diabetes, DVT, sarcoidosis, arthritis, presents today with lower left wrist and thumb pain ongoing for more than 1 month.  Patient chronically was prescribed Xarelto for DVTs and denies missing any doses.  She reports that the pain is exacerbated by lifting and with eversion type movements with her left hand.  She denies any injury.  She has only taken Tylenol as needed for pain with minimal improvement.  She has not taken any NSAIDs due to currently prescribed Xarelto.  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  She has not had any previous issues with wrist or arm pain.  No previous history of carpal tunnel. Past Medical History:  Diagnosis Date  . Acid reflux   . Anemia   . Arthritis   . Asthma   . Cervical dysplasia   . Diabetes mellitus   . DVT (deep venous thrombosis) ([place])   . Gallbladder problem   . GERD (gastroesophageal reflux disease)   . Heart murmur   . Hx of blood clots   . Hyperlipidemia   . Hypertension   . LBP (low back pain)   . Liver problem   . Obesity   . OSA (obstructive sleep apnea) 03/22/2018   New diagnosis. Mild.   . Pulmonary embolism ([place])   . Sarcoidosis   . Sleep apnea   . Vitamin D deficiency     Patient Active Problem List   Diagnosis Date Noted  . Pulmonary embolism ([place]) 08/15/2018  . Weight gain 08/03/2018  . OSA (obstructive sleep apnea) 03/22/2018  . Orthopnea 02/13/2018  . Family history of heart disease in female family member before age 42 02/13/2018  . GERD (gastroesophageal reflux disease) 12/21/2015  . Personal history of noncompliance with medical treatment, presenting hazards to health 12/21/2015  . Morbid obesity ([place])  03/03/2015  . History of anemia 03/03/2015  . Hyperlipidemia associated with type 2 diabetes mellitus ([place]) 03/03/2015  . Uncontrolled hypertension 03/03/2015  . Uncontrolled type 2 diabetes with eye complications ([place]) [id number]  . Uncontrolled type 2 diabetes mellitus with complication ([place])   . Arthritis   . Cervical dysplasia   . Sarcoidosis     Past Surgical History:  Procedure Laterality Date  . BREAST BIOPSY Left 2019  . BUNIONECTOMY    . CHOLECYSTECTOMY    . COLPOSCOPY    . GYNECOLOGIC CRYOSURGERY    . KNEE ARTHROSCOPY    . TUBAL LIGATION      OB History    Gravida  3   Para  3   Term  3   Preterm      AB      Living  3     SAB      TAB      Ectopic      Multiple      Live Births               Home Medications    Prior to Admission medications   Medication Sig Start Date End Date Taking? Authorizing Provider  amLODipine (NORVASC) 5 MG tablet TAKE 1 TABLET BY MOUTH EVERY DAY 12/14/18   Harland Dingwall  L, NP-C  B-D ULTRAFINE III SHORT PEN 31G X 8 MM MISC USE TO INJECT BASAGLAR DAILY 08/24/18   Renato Shin, MD  benazepril (LOTENSIN) 40 MG tablet TAKE 1 TABLET BY MOUTH EVERY DAY 03/01/19   Henson, [name] L, NP-C  cetirizine (ZYRTEC) 10 MG tablet Take 10 mg by mouth daily.    [provider]  cholecalciferol (VITAMIN D) 1000 units tablet Take 2,000 Units by mouth at bedtime.     [provider]  dapagliflozin propanediol (FARXIGA) 10 MG TABS tablet Take 10 mg by mouth daily before breakfast. WILL PROVIDE 30 DAY SUPPLY. MUST CALL TO SCHEDULE APPT 03/23/19   Renato Shin, MD  glucose blood (FREESTYLE LITE) test strip 1 each by Other route 4 (four) times daily. And lancets 4/day. 12/30/18   Renato Shin, MD  glucose blood [facility] VERIO) test strip Use as instructed 02/09/19   Abby Potash, PA-C  hydrochlorothiazide (HYDRODIURIL) 25 MG tablet Take 1 tablet (25 mg total) by mouth daily. 08/31/18   Nahser, Wonda Cheng, MD  insulin  glargine (LANTUS) 100 UNIT/ML injection Inject 40 Units into the skin at bedtime.    [provider]  metFORMIN (GLUCOPHAGE) 500 MG tablet Take 1 tablet (500 mg total) by mouth daily with breakfast. 09/02/18   Renato Shin, MD  metFORMIN (GLUCOPHAGE-XR) 500 MG 24 hr tablet TAKE 1 TABLET BY MOUTH TWICE A DAY 03/10/19   Renato Shin, MD  metoprolol tartrate (LOPRESSOR) 25 MG tablet Take 1 tablet (25 mg total) by mouth 2 (two) times daily. 08/18/18   Rai, Vernelle Emerald, MD  Omega-3 Fatty Acids (FISH OIL PO) Take 1 capsule by mouth at bedtime. Reported on 08/08/2015    [provider]  omeprazole (PRILOSEC) 40 MG capsule TAKE 1 CAPSULE BY MOUTH EVERY DAY 03/08/19   Henson, [name] L, NP-C  ondansetron (ZOFRAN) 4 MG tablet Take 4 mg by mouth every 8 (eight) hours as needed for nausea or vomiting.    [provider]  rivaroxaban (XARELTO) 20 MG TABS tablet Take 1 tablet (20 mg total) by mouth daily with supper. 09/08/18   Rai, Ripudeep K, MD  simvastatin (ZOCOR) 20 MG tablet TAKE 1 TABLET BY MOUTH EVERYDAY AT BEDTIME 06/08/19   Henson, [name] L, NP-C  Vitamin D, Ergocalciferol, (DRISDOL) 1.25 MG (50000 UNIT) CAPS capsule Take 1 capsule (50,000 Units total) by mouth every 7 (seven) days. 04/22/19   Eber Jones, MD    Family History Family History  Problem Relation Age of Onset  . Heart failure Mother 69       chf  . Diabetes Mother   . High blood pressure Mother   . Heart disease Mother   . Sudden death Mother   . Kidney disease Mother   . Obesity Mother   . Heart failure Father   . Heart disease Father   . Sudden death Father   . Alcoholism Father   . Cancer Brother 65       prostate  . Hypertension Maternal Uncle   . Hyperlipidemia Brother   . Diabetes Brother   . Colon cancer Maternal Grandfather   . Colon polyps Brother   . Diabetes Brother   . Hyperlipidemia Brother   . Stomach cancer Neg Hx   . Esophageal cancer Neg Hx   . Rectal cancer Neg Hx   . Liver  cancer Neg Hx   . Breast cancer Neg Hx     Social History Social History   Tobacco Use  .  Smoking status: Never Smoker  . Smokeless tobacco: Never Used  Substance Use Topics  . Alcohol use: No  . Drug use: No     Allergies   Naprosyn [naproxen]  Review of Systems Review of Systems Pertinent negatives listed in HPI Physical Exam Triage Vital Signs ED Triage Vitals  Enc Vitals Group     BP      Pulse      Resp      Temp      Temp src      SpO2      Weight      Height      Head Circumference      Peak Flow      Pain Score      Pain Loc      Pain Edu?      Excl. in [place]?    No data found.  Updated Vital Signs There were no vitals taken for this visit.  Visual Acuity Right Eye Distance:   Left Eye Distance:   Bilateral Distance:    Right Eye Near:   Left Eye Near:    Bilateral Near:     Physical Exam Constitutional:      Appearance: Normal appearance.  HENT:     Head: Normocephalic.  Eyes:     Extraocular Movements: Extraocular movements intact.  Cardiovascular:     Rate and Rhythm: Normal rate.  Pulmonary:     Effort: Pulmonary effort is normal.     Breath sounds: Normal breath sounds.  Musculoskeletal:     Left wrist: Swelling, tenderness and bony tenderness present. No effusion. Decreased range of motion.       Hands:  Skin:    General: Skin is warm and dry.  Neurological:     General: No focal deficit present.     Mental Status: She is alert and oriented to person, place, and time.  Psychiatric:        Mood and Affect: Mood normal.      UC Treatments / Results  Labs (all labs ordered are listed, but only abnormal results are displayed) Labs Reviewed - No data to display  EKG   Radiology No results found.  Procedures Procedures (including critical care time)  Medications Ordered in UC Medications - No data to display  Initial Impression / Assessment and Plan / UC Course  I have reviewed the triage vital signs and the  nursing notes.  Pertinent labs & imaging results that were available during my care of the patient were reviewed by me and considered in my medical decision making (see chart for details).    Suspect carpal tunnel syndrome involving the left wrist with left thumb involvement extending along the medial lateral aspect of left arm.  We will try some conservative treatment today with administration of a Solu-Medrol injection 125 IM administered here in clinic today.  Given patient history of uncontrolled diabetes most recent A1c 9.5 would like to avoid prescribing oral prednisone as this will only worsen glucose control.  Therefore will provide a left wrist splint and tizanidine 4 mg 3 times daily as needed for pain.  Provided follow-up information to Ortho if symptoms worsen or do not improve. Final Clinical Impressions(s) / UC Diagnoses   Final diagnoses:  Carpal tunnel syndrome of left wrist     Discharge Instructions     You received a prednisone injection to decrease the inflammation causing pain today at urgent care. I have prescribed tizanidine 4 mg 3  times daily as needed for pain.  Education can cause drowsiness therefore take caution while taking medication. I would recommend wearing the left wrist splint during hours while awake as compression helps with inflammation causing the pain to the wrist. If symptoms worsen or do not significantly improve within the next 7 days I would follow-up with Ortho care for further evaluation.    ED Prescriptions    Medication Sig Dispense Auth. Provider   tiZANidine (ZANAFLEX) 4 MG capsule Take 1 capsule (4 mg total) by mouth 3 (three) times daily. 30 capsule Scot Jun, FNP     PDMP not reviewed this encounter.   Scot Jun, FNP 06/15/19 1757

## 2019-06-30 ENCOUNTER — Other Ambulatory Visit: Payer: Self-pay | Admitting: Family Medicine

## 2019-06-30 DIAGNOSIS — I10 Essential (primary) hypertension: Secondary | ICD-10-CM

## 2019-06-30 DIAGNOSIS — I152 Hypertension secondary to endocrine disorders: Secondary | ICD-10-CM

## 2019-06-30 DIAGNOSIS — E1159 Type 2 diabetes mellitus with other circulatory complications: Secondary | ICD-10-CM

## 2019-07-07 ENCOUNTER — Other Ambulatory Visit: Payer: Self-pay | Admitting: Family Medicine

## 2019-07-07 DIAGNOSIS — K219 Gastro-esophageal reflux disease without esophagitis: Secondary | ICD-10-CM

## 2019-08-17 ENCOUNTER — Ambulatory Visit: Payer: 59 | Admitting: Endocrinology

## 2019-08-17 ENCOUNTER — Telehealth: Payer: Self-pay | Admitting: *Deleted

## 2019-08-17 DIAGNOSIS — Z0289 Encounter for other administrative examinations: Secondary | ICD-10-CM

## 2019-08-17 NOTE — Telephone Encounter (Signed)
Late entry - 6/18 1630: Returned patient's call regarding needing a Xarelto prescription as she is running out of medication. Per Dr. Irene Limbo, patient last seen in November and was to follow up with PCP. Informed patient of this and she verbalized understanding and would call PCP.

## 2019-08-19 ENCOUNTER — Telehealth: Payer: 59 | Admitting: Family Medicine

## 2019-08-19 ENCOUNTER — Encounter: Payer: 59 | Admitting: Family Medicine

## 2019-08-19 ENCOUNTER — Other Ambulatory Visit: Payer: Self-pay

## 2019-08-19 ENCOUNTER — Encounter: Payer: Self-pay | Admitting: Family Medicine

## 2019-08-19 VITALS — Wt 250.0 lb

## 2019-08-19 DIAGNOSIS — I10 Essential (primary) hypertension: Secondary | ICD-10-CM | POA: Diagnosis not present

## 2019-08-19 DIAGNOSIS — Z7901 Long term (current) use of anticoagulants: Secondary | ICD-10-CM

## 2019-08-19 DIAGNOSIS — D869 Sarcoidosis, unspecified: Secondary | ICD-10-CM

## 2019-08-19 DIAGNOSIS — Z86711 Personal history of pulmonary embolism: Secondary | ICD-10-CM

## 2019-08-19 DIAGNOSIS — IMO0002 Reserved for concepts with insufficient information to code with codable children: Secondary | ICD-10-CM

## 2019-08-19 DIAGNOSIS — R197 Diarrhea, unspecified: Secondary | ICD-10-CM

## 2019-08-19 DIAGNOSIS — E1139 Type 2 diabetes mellitus with other diabetic ophthalmic complication: Secondary | ICD-10-CM

## 2019-08-19 DIAGNOSIS — E1169 Type 2 diabetes mellitus with other specified complication: Secondary | ICD-10-CM

## 2019-08-19 DIAGNOSIS — E1165 Type 2 diabetes mellitus with hyperglycemia: Secondary | ICD-10-CM

## 2019-08-19 DIAGNOSIS — J069 Acute upper respiratory infection, unspecified: Secondary | ICD-10-CM

## 2019-08-19 DIAGNOSIS — E785 Hyperlipidemia, unspecified: Secondary | ICD-10-CM

## 2019-08-19 DIAGNOSIS — E559 Vitamin D deficiency, unspecified: Secondary | ICD-10-CM

## 2019-08-19 DIAGNOSIS — G4733 Obstructive sleep apnea (adult) (pediatric): Secondary | ICD-10-CM

## 2019-08-19 MED ORDER — RIVAROXABAN 10 MG PO TABS: 10.0000 mg | ORAL_TABLET | Freq: Every day | ORAL | 1 refills | Status: DC

## 2019-08-19 MED ORDER — METOPROLOL TARTRATE 25 MG PO TABS: 25.0000 mg | ORAL_TABLET | Freq: Two times a day (BID) | ORAL | 1 refills | Status: DC

## 2019-08-19 MED ORDER — AMLODIPINE BESYLATE 5 MG PO TABS: 5.0000 mg | ORAL_TABLET | Freq: Every day | ORAL | 1 refills | Status: DC

## 2019-08-19 NOTE — Progress Notes (Signed)
Subjective:  Documentation for virtual audio and video telecommunications through [place] encounter:  The patient was located at home. 2 patient identifiers used.  The provider was located in the office. The patient did consent to this visit and is aware of possible charges through their insurance for this visit.  The other persons participating in this telemedicine service were none. Time spent on call was 25 minutes and in review of previous records 30 minutes total.  This virtual service is not related to other E/M service within previous 7 days.   Patient ID: Victoria Padilla, female    DOB: Mar 14, 1965, 54 y.o.   MRN: [id number]  HPI Chief Complaint  Patient presents with  . med check    med check, cold, cough , sore throat, diarrhea, ear ache since monday    This visit was schedule in office for a medication management visit but due to acute URI symptoms, we changed this to a virtual visit.   Complains of nasal congestion, right ear pain, right sided sore throat, cough, nausea, and diarrhea started today. Also feeling a little light headed.   Drinking plenty of water. Does not feel dehydrated.   No fever, chills, sneezing, rhinorrhea, chest pain, palpitations, shortness of breath, abdominal pain, or vomiting.   She did not get her Covid vaccines.   Other providers:  Dr. Irene Limbo- hematology Dr. Elsworth Soho- pulmonology Dr. Lissa Merlin- endocrinology  GI- [facility] - she saw them until February 2021. States she did not like the plan she was on. She was frustrated. She is considering calling them back to discuss a different plan.   She was supposed to follow up with Dr. Elsworth Soho in March or April but has not.   HTN- states she has been out of metoprolol but has been taking her other HTN medications. Does not check her BP at home.   PE/DVT - states she has been out of Xarelto for the past 10 days.   Mild OSA- is not using her CPAP regularly  Sarcoidosis-  followed by pulmonology  Hx of vitamin D def- took prescription D for a while but no longer has this. Needs recheck  Per Dr. Irene Limbo- she should see vascular surgeon for chronic venous reflux  Needs follow up on bilateral hilar lymph nodes as well   Diabetes uncontrolled- managed by Dr. Loanne Drilling. States she has an upcoming appt     Review of Systems Pertinent positives and negatives in the history of present illness.     Objective:   Physical Exam Wt 250 lb (113.4 kg)   BMI 44.29 kg/m   Alert and oriented and in no acute distress. Respirations unlabored. Speaking in complete sentences.  Not otherwise examined.       Assessment & Plan:  Viral upper respiratory tract infection -She was scheduled to come in for a medication management visit.  When she arrived she complained of URI symptoms for the past 3 days.  The visit was changed to a virtual visit.  Discussed symptomatic treatment.  She does not appear to be in any distress.  I recommend she get a Covid test.  She has not had her Covid vaccine.  She agrees.  Discussed that if she gets much worse that she should go to the emergency department or an urgent care for evaluation.  Diarrhea, unspecified type -Recommend she stay well-hydrated.  She is aware that if she gets much worse and is feeling dehydrated that she would need to  go to the emergency department for rehydration.  Consider Imodium as long as no fever or blood in her stool.  Uncontrolled hypertension -She is not checking her blood pressure at home.  I will refill her medication and she will return in 4 weeks  Hyperlipidemia associated with type 2 diabetes mellitus ([place]) -Continue statin therapy  Sarcoidosis -Follow-up with pulmonology as recommended  OSA (obstructive sleep apnea) -She is not currently using her CPAP.  Recommend she call her supplier and discuss that it is not fitting appropriately.  Recommend she follow-up with pulmonology.  History of pulmonary  embolus (PE)  Current use of anticoagulant therapy -She has been out of her anticoagulant for the past 10 days.  I will refill this.  She is aware that she can continue taking this indefinitely.  Uncontrolled type 2 diabetes with eye complications ([place]) -Recommend she follow-up with Dr. Lissa Merlin who is managing her diabetes  Vitamin D deficiency -She will need to come in in 4 weeks for labs including vitamin D level

## 2019-09-08 ENCOUNTER — Other Ambulatory Visit: Payer: Self-pay | Admitting: Family Medicine

## 2019-09-11 ENCOUNTER — Other Ambulatory Visit: Payer: Self-pay | Admitting: Cardiovascular Disease

## 2019-10-14 ENCOUNTER — Other Ambulatory Visit: Payer: Self-pay | Admitting: Cardiovascular Disease

## 2019-10-18 ENCOUNTER — Encounter: Payer: Self-pay | Admitting: Cardiovascular Disease

## 2019-10-18 NOTE — Progress Notes (Signed)
This encounter was created in error - please disregard.

## 2019-10-19 ENCOUNTER — Encounter: Payer: 59 | Admitting: Cardiovascular Disease

## 2019-10-26 ENCOUNTER — Other Ambulatory Visit: Payer: Self-pay

## 2019-10-26 ENCOUNTER — Ambulatory Visit: Payer: 59 | Admitting: Family Medicine

## 2019-10-26 ENCOUNTER — Encounter: Payer: Self-pay | Admitting: Family Medicine

## 2019-10-26 VITALS — BP 130/80 | HR 81 | Ht 63.0 in | Wt 252.2 lb

## 2019-10-26 DIAGNOSIS — Z0000 Encounter for general adult medical examination without abnormal findings: Secondary | ICD-10-CM

## 2019-10-26 DIAGNOSIS — E559 Vitamin D deficiency, unspecified: Secondary | ICD-10-CM

## 2019-10-26 DIAGNOSIS — Z7189 Other specified counseling: Secondary | ICD-10-CM

## 2019-10-26 DIAGNOSIS — M25551 Pain in right hip: Secondary | ICD-10-CM

## 2019-10-26 DIAGNOSIS — Z6841 Body Mass Index (BMI) 40.0 and over, adult: Secondary | ICD-10-CM

## 2019-10-26 DIAGNOSIS — Z86711 Personal history of pulmonary embolism: Secondary | ICD-10-CM

## 2019-10-26 DIAGNOSIS — G4733 Obstructive sleep apnea (adult) (pediatric): Secondary | ICD-10-CM

## 2019-10-26 DIAGNOSIS — Z7901 Long term (current) use of anticoagulants: Secondary | ICD-10-CM

## 2019-10-26 DIAGNOSIS — I10 Essential (primary) hypertension: Secondary | ICD-10-CM

## 2019-10-26 DIAGNOSIS — E1169 Type 2 diabetes mellitus with other specified complication: Secondary | ICD-10-CM | POA: Diagnosis not present

## 2019-10-26 DIAGNOSIS — Z7185 Encounter for immunization safety counseling: Secondary | ICD-10-CM

## 2019-10-26 DIAGNOSIS — M25552 Pain in left hip: Secondary | ICD-10-CM

## 2019-10-26 DIAGNOSIS — E785 Hyperlipidemia, unspecified: Secondary | ICD-10-CM

## 2019-10-26 DIAGNOSIS — G8929 Other chronic pain: Secondary | ICD-10-CM

## 2019-10-26 NOTE — Patient Instructions (Addendum)
Here is a list of Eye Doctors that you call and schedule an appointment with.   [facility] Address: [address], [place], [place] [address] Phone: [phone]   [facility] Address: [address], [place], [place] [address] Phone: [phone]  Dr. Katy Fitch Address: [address] [employer], [place], [place] [address] Phone: [phone]  You should hear from [facility] to schedule a visit for your hip pain.     Preventive Care 4-60 Years Old, Female Preventive care refers to visits with your health care provider and lifestyle choices that can promote health and wellness. This includes:  A yearly physical exam. This may also be called an annual well check.  Regular dental visits and eye exams.  Immunizations.  Screening for certain conditions.  Healthy lifestyle choices, such as eating a healthy diet, getting regular exercise, not using drugs or products that contain nicotine and tobacco, and limiting alcohol use. What can I expect for my preventive care visit? Physical exam Your health care provider will check your:  Height and weight. This may be used to calculate body mass index (BMI), which tells if you are at a healthy weight.  Heart rate and blood pressure.  Skin for abnormal spots. Counseling Your health care provider may ask you questions about your:  Alcohol, tobacco, and drug use.  Emotional well-being.  Home and relationship well-being.  Sexual activity.  Eating habits.  Work and work Statistician.  Method of birth control.  Menstrual cycle.  Pregnancy history. What immunizations do I need?  Influenza (flu) vaccine  This is recommended every year. Tetanus, diphtheria, and pertussis (Tdap) vaccine  You may need a Td booster every 10 years. Varicella (chickenpox) vaccine  You may need this if you have not been vaccinated. Zoster (shingles) vaccine  You may need this after age 43. Measles, mumps, and rubella (MMR)  vaccine  You may need at least one dose of MMR if you were born in 1957 or later. You may also need a second dose. Pneumococcal conjugate (PCV13) vaccine  You may need this if you have certain conditions and were not previously vaccinated. Pneumococcal polysaccharide (PPSV23) vaccine  You may need one or two doses if you smoke cigarettes or if you have certain conditions. Meningococcal conjugate (MenACWY) vaccine  You may need this if you have certain conditions. Hepatitis A vaccine  You may need this if you have certain conditions or if you travel or work in places where you may be exposed to hepatitis A. Hepatitis B vaccine  You may need this if you have certain conditions or if you travel or work in places where you may be exposed to hepatitis B. Haemophilus influenzae type b (Hib) vaccine  You may need this if you have certain conditions. Human papillomavirus (HPV) vaccine  If recommended by your health care provider, you may need three doses over 6 months. You may receive vaccines as individual doses or as more than one vaccine together in one shot (combination vaccines). Talk with your health care provider about the risks and benefits of combination vaccines. What tests do I need? Blood tests  Lipid and cholesterol levels. These may be checked every 5 years, or more frequently if you are over 69 years old.  Hepatitis C test.  Hepatitis B test. Screening  Lung cancer screening. You may have this screening every year starting at age 67 if you have a 30-pack-year history of smoking and currently smoke or have quit  within the past 15 years.  Colorectal cancer screening. All adults should have this screening starting at age 87 and continuing until age 40. Your health care provider may recommend screening at age 62 if you are at increased risk. You will have tests every 1-10 years, depending on your results and the type of screening test.  Diabetes screening. This is done by  checking your blood sugar (glucose) after you have not eaten for a while (fasting). You may have this done every 1-3 years.  Mammogram. This may be done every 1-2 years. Talk with your health care provider about when you should start having regular mammograms. This may depend on whether you have a family history of breast cancer.  BRCA-related cancer screening. This may be done if you have a family history of breast, ovarian, tubal, or peritoneal cancers.  Pelvic exam and Pap test. This may be done every 3 years starting at age 72. Starting at age 84, this may be done every 5 years if you have a Pap test in combination with an HPV test. Other tests  Sexually transmitted disease (STD) testing.  Bone density scan. This is done to screen for osteoporosis. You may have this scan if you are at high risk for osteoporosis. Follow these instructions at home: Eating and drinking  Eat a diet that includes fresh fruits and vegetables, whole grains, lean protein, and low-fat dairy.  Take vitamin and mineral supplements as recommended by your health care provider.  Do not drink alcohol if: ? Your health care provider tells you not to drink. ? You are pregnant, may be pregnant, or are planning to become pregnant.  If you drink alcohol: ? Limit how much you have to 0-1 drink a day. ? Be aware of how much alcohol is in your drink. In the [place] one drink equals one 12 oz bottle of beer (355 mL), one 5 oz glass of wine (148 mL), or one 1 oz glass of hard liquor (44 mL). Lifestyle  Take daily care of your teeth and gums.  Stay active. Exercise for at least 30 minutes on 5 or more days each week.  Do not use any products that contain nicotine or tobacco, such as cigarettes, e-cigarettes, and chewing tobacco. If you need help quitting, ask your health care provider.  If you are sexually active, practice safe sex. Use a condom or other form of birth control (contraception) in order to prevent pregnancy  and STIs (sexually transmitted infections).  If told by your health care provider, take low-dose aspirin daily starting at age 9. What's next?  Visit your health care provider once a year for a well check visit.  Ask your health care provider how often you should have your eyes and teeth checked.  Stay up to date on all vaccines. This information is not intended to replace advice given to you by your health care provider. Make sure you discuss any questions you have with your health care provider. Document Revised: 10/23/2017 Document Reviewed: 10/23/2017 Elsevier Patient Education  2020 [employer].

## 2019-10-26 NOTE — Progress Notes (Signed)
Subjective:    Patient ID: Victoria Padilla, female    DOB: Apr 26, 1965, 54 y.o.   MRN: [id number]  HPI Chief Complaint  Patient presents with  . cpe    cpe non fasting, sees obgyn in october   She is here for a complete physical exam.  Last CPE: 2017  Other providers: Endocrinologist- Dr. Loanne Drilling  Cardiologist- Dr. Acie Fredrickson  Pulmonologist- Dr. Elsworth Soho  Hematologist- Dr. Ferman [facility]- has not been there in months but still using the plan   Hx of PE and has been on Xarelto since that time. Doing well and no concerns. No bleeding.   Complains of a 1 year history of worsening bilateral hip pain. Now she is having to take pain medication, Tramadol, to be able to do her 3 mile walk. Pain is not as bothersome with her ADLs only with her long walks.   Stretching her hips improves her pain.  States she cannot sleep on her sides due to the pain.   3 toes on her left foot go numb when sitting for long periods. She has LE edema.  Has been eating a low sodium diet but does eat lunch meat often.   Dr. Elsworth Soho - manages her respiratory issues including sarcoidosis and OSA. States she has not been using her CPAP    Social history: her son lives with her, works at her desk in process ordering  Denies smoking, drinking alcohol, drug use  Diet: fairly low in sodium, high in protein and low in calories  Excerise: has been walking 3 miles per day but not recently   Immunizations:Tdap is UTD. Declines flu shot and Covid vaccine.   Health maintenance:  Mammogram: 11/2018 Colonoscopy: 03/2016 Last Gynecological Exam: last year and scheduled in October  Last Dental Exam: early 2020 Last Eye Exam: due for this   Wears seatbelt always, smoke detectors in home and functioning, does not text while driving and feels safe in home environment.   Reviewed allergies, medications, past medical, surgical, family, and social history.   Review of Systems Review of  Systems Constitutional: -fever, -chills, -sweats, -unexpected weight change,-fatigue ENT: -runny nose, -ear pain, -sore throat Cardiology:  -chest pain, -palpitations, +edema Respiratory: -cough, -shortness of breath, -wheezing Gastroenterology: -abdominal pain, -nausea, -vomiting, -diarrhea, +constipation  Hematology: -bleeding or bruising problems Musculoskeletal: -arthralgias, -myalgias, -joint swelling, -back pain Ophthalmology: -vision changes Urology: -dysuria, -difficulty urinating, -hematuria, -urinary frequency, -urgency Neurology: -headache, -weakness, -tingling, -numbness       Objective:   Physical Exam BP 130/80   Pulse 81   Ht 5\' 3"  (1.6 m)   Wt 252 lb 3.2 oz (114.4 kg)   BMI 44.68 kg/m   General Appearance:    Alert, cooperative, no distress, appears stated age  Head:    Normocephalic, without obvious abnormality, atraumatic  Eyes:    PERRL, conjunctiva/corneas clear, EOM's intact  Ears:    Normal TM's and external ear canals  Nose:   Mask in place   Throat:   Mask in place   Neck:   Supple, no lymphadenopathy;  thyroid:  no   enlargement/tenderness/nodules; no JVD  Back:    Spine nontender, no curvature, ROM normal, no CVA     tenderness  Lungs:     Clear to auscultation bilaterally without wheezes, rales or     ronchi; respirations unlabored  Chest Wall:    No tenderness or deformity   Heart:    Regular rate and rhythm  Breast  Exam:    OB/GYN   Abdomen:     Soft, non-tender, nondistended, normoactive bowel sounds,    no masses, no hepatosplenomegaly  Genitalia:    OB/GYN     Extremities:   No clubbing, cyanosis. Non pitting edema to ankles and feet. Calves non tender  Pulses:   2+ and symmetric all extremities  Skin:   Skin color, texture, turgor normal, no rashes or lesions  Lymph nodes:   Cervical, supraclavicular, and axillary nodes normal  Neurologic:   CNII-XII intact, normal strength, sensation and gait; reflexes 2+ and symmetric throughout           Psych:   Normal mood, affect, hygiene and grooming.         Assessment & Plan:  Routine general medical examination at a health care facility - Plan: CBC with Differential/Platelet, Comprehensive metabolic panel, TSH, T4, free, Lipid panel -preventive health care reviewed. Sees OB/GYN. Counseling on healthy lifestyle. Continue seeing specialists. Discussed safety  Essential hypertension - Plan: CBC with Differential/Platelet, Comprehensive metabolic panel -controlled. Continue medication   OSA (obstructive sleep apnea) -is not currently using CPAP. Follow up with pulmonlogy  Morbid obesity ([place])  Hyperlipidemia associated with type 2 diabetes mellitus ([place]) - Plan: Lipid panel -continue statin. Follow up pending lipids  Class 3 severe obesity with serious comorbidity and body mass index (BMI) of 40.0 to 44.9 in adult, unspecified obesity type ([place]) - Plan: TSH, T4, free -counseling on healthy diet and exercise.   Vitamin D deficiency - Plan: VITAMIN D 25 Hydroxy (Vit-D Deficiency, Fractures) -check vitamin D level and follow up   History of pulmonary embolus (PE) -on Xarelto  Current use of anticoagulant therapy -doing well on Xarelto.   Chronic hip pain, bilateral - Plan: Ambulatory referral to Orthopedic Surgery -suspect bursitis vs arthritis. Worsening and not relieved with conservative treatment. Cannot take NSAIDs. Refer to ortho  Immunization counseling -declines Covid vaccine. Encouraged her to get this. She may return if she changes her mind.  Declines pneumonia vaccine Tdap UTD [facility] counseling

## 2019-10-27 ENCOUNTER — Encounter: Payer: Self-pay | Admitting: Family Medicine

## 2019-10-27 LAB — COMPREHENSIVE METABOLIC PANEL
ALT: 26 IU/L (ref 0–32)
AST: 22 IU/L (ref 0–40)
Albumin/Globulin Ratio: 1.2 (ref 1.2–2.2)
Albumin: 4.2 g/dL (ref 3.8–4.9)
Alkaline Phosphatase: 127 IU/L — ABNORMAL HIGH (ref 48–121)
BUN/Creatinine Ratio: 17 (ref 9–23)
BUN: 16 mg/dL (ref 6–24)
Bilirubin Total: 0.3 mg/dL (ref 0.0–1.2)
CO2: 25 mmol/L (ref 20–29)
Calcium: 9.8 mg/dL (ref 8.7–10.2)
Chloride: 99 mmol/L (ref 96–106)
Creatinine, Ser: 0.92 mg/dL (ref 0.57–1.00)
GFR calc Af Amer: 80 mL/min/{1.73_m2} (ref >59)
GFR calc non Af Amer: 70 mL/min/{1.73_m2} (ref >59)
Globulin, Total: 3.5 g/dL (ref 1.5–4.5)
Glucose: 210 mg/dL — ABNORMAL HIGH (ref 65–99)
Potassium: 4.3 mmol/L (ref 3.5–5.2)
Sodium: 138 mmol/L (ref 134–144)
Total Protein: 7.7 g/dL (ref 6.0–8.5)

## 2019-10-27 LAB — CBC WITH DIFFERENTIAL/PLATELET
Basophils Absolute: 0.1 10*3/uL (ref 0.0–0.2)
Basos: 1 %
EOS (ABSOLUTE): 0.1 10*3/uL (ref 0.0–0.4)
Eos: 1 %
Hematocrit: 42.7 % (ref 34.0–46.6)
Hemoglobin: 12.7 g/dL (ref 11.1–15.9)
Immature Grans (Abs): 0 10*3/uL (ref 0.0–0.1)
Immature Granulocytes: 0 %
Lymphocytes Absolute: 2.4 10*3/uL (ref 0.7–3.1)
Lymphs: 36 %
MCH: 22.1 pg — ABNORMAL LOW (ref 26.6–33.0)
MCHC: 29.7 g/dL — ABNORMAL LOW (ref 31.5–35.7)
MCV: 74 fL — ABNORMAL LOW (ref 79–97)
Monocytes Absolute: 0.5 10*3/uL (ref 0.1–0.9)
Monocytes: 8 %
Neutrophils Absolute: 3.6 10*3/uL (ref 1.4–7.0)
Neutrophils: 54 %
Platelets: 252 10*3/uL (ref 150–450)
RBC: 5.74 x10E6/uL — ABNORMAL HIGH (ref 3.77–5.28)
RDW: 15.1 % (ref 11.7–15.4)
WBC: 6.7 10*3/uL (ref 3.4–10.8)

## 2019-10-27 LAB — LIPID PANEL
Chol/HDL Ratio: 3.1 ratio (ref 0.0–4.4)
Cholesterol, Total: 154 mg/dL (ref 100–199)
HDL: 49 mg/dL (ref >39)
LDL Chol Calc (NIH): 83 mg/dL (ref 0–99)
Triglycerides: 122 mg/dL (ref 0–149)
VLDL Cholesterol Cal: 22 mg/dL (ref 5–40)

## 2019-10-27 LAB — VITAMIN D 25 HYDROXY (VIT D DEFICIENCY, FRACTURES): Vit D, 25-Hydroxy: 32.3 ng/mL (ref 30.0–100.0)

## 2019-10-27 LAB — T4, FREE: Free T4: 1.24 ng/dL (ref 0.82–1.77)

## 2019-10-27 LAB — TSH: TSH: 0.718 u[IU]/mL (ref 0.450–4.500)

## 2019-10-28 ENCOUNTER — Telehealth: Payer: Self-pay | Admitting: Family Medicine

## 2019-10-28 NOTE — Telephone Encounter (Signed)
I see what she is talking about and she had a CT of her chest since that time. Hilar lymph nodes consistent with her sarcoidosis. I also recommend she discuss this with Dr. Elsworth Soho, her pulmonologist.

## 2019-10-28 NOTE — Telephone Encounter (Signed)
Pt was notified to contact pulmonology

## 2019-10-28 NOTE — Telephone Encounter (Signed)
Pt called and states that she was here for a cpe the other day and she did not get the cover the chest xray that she had last year and was wanting to know what the results , states she thinks something was on it the be concerned for she can be reached at [phone]

## 2019-10-28 NOTE — Telephone Encounter (Signed)
Please find out more information. Is she referring to the XR and CT of her chest from when she had the PE?

## 2019-10-28 NOTE — Telephone Encounter (Signed)
Pt states that a month ago that you made a comment to her about discussing something about what you saw on some xrays and didn't get mentioned at the visit the other day. She is isn't sure what xrays you were referring too and I can't find any recently xrays.

## 2019-10-29 ENCOUNTER — Encounter: Payer: Self-pay | Admitting: Cardiovascular Disease

## 2019-10-29 ENCOUNTER — Ambulatory Visit (INDEPENDENT_AMBULATORY_CARE_PROVIDER_SITE_OTHER): Payer: 59 | Admitting: Cardiovascular Disease

## 2019-10-29 ENCOUNTER — Other Ambulatory Visit: Payer: Self-pay

## 2019-10-29 VITALS — BP 134/76 | HR 81 | Ht 63.0 in | Wt 253.4 lb

## 2019-10-29 DIAGNOSIS — I10 Essential (primary) hypertension: Secondary | ICD-10-CM

## 2019-10-29 DIAGNOSIS — I2609 Other pulmonary embolism with acute cor pulmonale: Secondary | ICD-10-CM

## 2019-10-29 NOTE — Progress Notes (Addendum)
Cardiology Office Note:    Date:  10/29/2019   ID:  Victoria Padilla, DOB 27-Sep-1965, MRN [id number]  PCP:  [name], NP-C  Cardiologist:  Mertie Moores, MD  Electrophysiologist:  None   Referring MD: [name], NP-C   Chief Complaint  Patient presents with  . Hypertension     Feb. 10, 2020    Victoria Padilla Such is a 54 y.o. female with a hx of hypertension, hyperlipidemia.  We are asked to see her today by [name], NP-C for further evaluation of some chronic chest pain.  Has had chest soreness , off and on for years.   Seems to have worsened several weeks ago Does not exercise Works on computers all day  - no activity at work .  Not pleuretic, not related to eating or drinking. Not positional  May last 30 or more minutes.  Can last for 1 day   Pains are not related to walking into grocery store  Not related to climbing stairs.   Not related to shortness or breath , syncope .  No diaphoresis   Avoids salt  ,  Avoids processed foods.   Sept. 3, 2021: Victoria Padilla is seen today for follow up of her HTN and atypical chest pain .   I saw her last year for atypical CP and she was to follow up PRN.   Exercising regularly ,   Walks 3 miles a day, 3 days a week . No cp , dyspnea  No palpitations  All the chest pains from last year have resolved.   Hx of DVT / PE  Is now on Xarelto    Past Medical History:  Diagnosis Date  . Acid reflux   . Anemia   . Arthritis   . Asthma   . Cervical dysplasia   . Diabetes mellitus   . DVT (deep venous thrombosis) ([place])   . Gallbladder problem   . GERD (gastroesophageal reflux disease)   . Heart murmur   . Hx of blood clots   . Hyperlipidemia   . Hypertension   . LBP (low back pain)   . Liver problem   . Obesity   . OSA (obstructive sleep apnea) 03/22/2018   New diagnosis. Mild.   . Pulmonary embolism ([place])   . Sarcoidosis   . Sleep apnea   . Vitamin D deficiency     Past Surgical History:  Procedure  Laterality Date  . BREAST BIOPSY Left 2019  . BUNIONECTOMY    . CHOLECYSTECTOMY    . COLPOSCOPY    . GYNECOLOGIC CRYOSURGERY    . KNEE ARTHROSCOPY    . TUBAL LIGATION      Current Medications: Current Meds  Medication Sig  . amLODipine (NORVASC) 5 MG tablet Take 1 tablet (5 mg total) by mouth daily.  . B-D ULTRAFINE III SHORT PEN 31G X 8 MM MISC USE TO INJECT BASAGLAR DAILY  . benazepril (LOTENSIN) 40 MG tablet TAKE 1 TABLET BY MOUTH EVERY DAY  . cetirizine (ZYRTEC) 10 MG tablet Take 10 mg by mouth daily.  . cholecalciferol (VITAMIN D) 1000 units tablet Take 2,000 Units by mouth at bedtime.   . dapagliflozin propanediol (FARXIGA) 10 MG TABS tablet Take 10 mg by mouth daily before breakfast. WILL PROVIDE 30 DAY SUPPLY. MUST CALL TO SCHEDULE APPT  . glucose blood (FREESTYLE LITE) test strip 1 each by Other route 4 (four) times daily. And lancets 4/day.  Marland Kitchen glucose blood (ONETOUCH VERIO) test  strip Use as instructed  . hydrochlorothiazide (HYDRODIURIL) 25 MG tablet Take 1 tablet (25 mg total) by mouth daily.  . insulin glargine (LANTUS) 100 UNIT/ML injection Inject 40 Units into the skin at bedtime.  . metFORMIN (GLUCOPHAGE-XR) 500 MG 24 hr tablet TAKE 1 TABLET BY MOUTH TWICE A DAY  . metoprolol tartrate (LOPRESSOR) 25 MG tablet Take 1 tablet (25 mg total) by mouth 2 (two) times daily.  . Omega-3 Fatty Acids (FISH OIL PO) Take 1 capsule by mouth at bedtime. Reported on 08/08/2015  . omeprazole (PRILOSEC) 40 MG capsule TAKE 1 CAPSULE BY MOUTH EVERY DAY  . ondansetron (ZOFRAN) 4 MG tablet Take 4 mg by mouth every 8 (eight) hours as needed for nausea or vomiting.  . rivaroxaban (XARELTO) 10 MG TABS tablet Take 1 tablet (10 mg total) by mouth daily.  . simvastatin (ZOCOR) 20 MG tablet TAKE 1 TABLET BY MOUTH EVERYDAY AT BEDTIME     Allergies:   Naprosyn [naproxen]   Social History   Socioeconomic History  . Marital status: Single    Spouse name: Not on file  . Number of children: 3  .  Years of education: Not on file  . Highest education level: Not on file  Occupational History  . Occupation: Careers adviser: [employer]  Tobacco Use  . Smoking status: Never Smoker  . Smokeless tobacco: Never Used  Vaping Use  . Vaping Use: Never used  Substance and Sexual Activity  . Alcohol use: No  . Drug use: No  . Sexual activity: Not Currently    Birth control/protection: Surgical  Other Topics Concern  . Not on file  Social History Narrative  . Not on file   Social Determinants of Health   Financial Resource Strain:   . Difficulty of Paying Living Expenses: Not on file  Food Insecurity:   . Worried About Charity fundraiser in the Last Year: Not on file  . Ran Out of Food in the Last Year: Not on file  Transportation Needs:   . Lack of Transportation (Medical): Not on file  . Lack of Transportation (Non-Medical): Not on file  Physical Activity:   . Days of Exercise per Week: Not on file  . Minutes of Exercise per Session: Not on file  Stress:   . Feeling of Stress : Not on file  Social Connections:   . Frequency of Communication with Friends and Family: Not on file  . Frequency of Social Gatherings with Friends and Family: Not on file  . Attends Religious Services: Not on file  . Active Member of Clubs or Organizations: Not on file  . Attends Archivist Meetings: Not on file  . Marital Status: Not on file     Family History: The patient's family history includes Alcoholism in her father; Cancer (age of onset: 75) in her brother; Colon cancer in her maternal grandfather; Colon polyps in her brother; Diabetes in her brother, brother, and mother; Heart disease in her father and mother; Heart failure in her father; Heart failure (age of onset: 24) in her mother; High blood pressure in her mother; Hyperlipidemia in her brother and brother; Hypertension in her maternal uncle; Kidney disease in her mother; Obesity in her mother; Sudden  death in her father and mother. There is no history of Stomach cancer, Esophageal cancer, Rectal cancer, Liver cancer, or Breast cancer.  ROS:   Please see the history of present illness.  All other systems reviewed and are negative.  EKGs/Labs/Other Studies Reviewed:    The following studies were reviewed today:   EKG:   October 29, 2019: Normal sinus rhythm at 81.  No ST or T wave abnormalities.  Recent Labs: 10/26/2019: ALT 26; BUN 16; Creatinine, Ser 0.92; Hemoglobin 12.7; Platelets 252; Potassium 4.3; Sodium 138; TSH 0.718  Recent Lipid Panel    Component Value Date/Time   CHOL 154 10/26/2019 1440   TRIG 122 10/26/2019 1440   HDL 49 10/26/2019 1440   CHOLHDL 3.1 10/26/2019 1440   CHOLHDL 3.5 12/04/2016 0831   VLDL 27 01/23/2016 0937   LDLCALC 83 10/26/2019 1440   LDLCALC 121 (H) 12/04/2016 0831    Physical Exam:     Physical Exam: Blood pressure 134/76, pulse 81, height 5\' 3"  (1.6 m), weight 253 lb 6.4 oz (114.9 kg), SpO2 96 %.  GEN:  Well nourished, well developed in no acute distress HEENT: Normal NECK: No JVD; No carotid bruits LYMPHATICS: No lymphadenopathy CARDIAC: RRR , no murmurs, rubs, gallops RESPIRATORY:  Clear to auscultation without rales, wheezing or rhonchi  ABDOMEN: Soft, non-tender, non-distended MUSCULOSKELETAL:  No edema; No deformity  SKIN: Warm and dry NEUROLOGIC:  Alert and oriented x 3    ASSESSMENT:    1. Essential hypertension   2. Other acute pulmonary embolism with acute cor pulmonale ([place])   3. Benign essential HTN    PLAN:     1.   HTN:  BP is well controlled.   Advised continued weight management   2.  Pulmonary embolus:  Cont xarelto   3. Obesity:   Advised contued diet, exercise, Advised her to avoid foods that are " white, wheat, sweet"  She is very stable and at this point all of her medications are managed by Mack Hook, NP.  I will have her continue to follow-up with Jocelyn Lamer and I will see her on an as-needed  basis.      Medication Adjustments/Labs and Tests Ordered: Current medicines are reviewed at length with the patient today.  Concerns regarding medicines are outlined above.  Orders Placed This Encounter  Procedures  . EKG 12-Lead   No orders of the defined types were placed in this encounter.   Patient Instructions  Medication Instructions:  Your provider recommends that you continue on your current medications as directed. Please refer to the Current Medication list given to you today.   *If you need a refill on your cardiac medications before your next appointment, please call your pharmacy*  Follow-Up: Your provider recommends that you schedule a follow-up appointment AS NEEDED.    Signed, Mertie Moores, MD  10/29/2019 3:18 PM     [facility]

## 2019-10-29 NOTE — Patient Instructions (Signed)
Medication Instructions:  Your provider recommends that you continue on your current medications as directed. Please refer to the Current Medication list given to you today.   *If you need a refill on your cardiac medications before your next appointment, please call your pharmacy*  Follow-Up: Your provider recommends that you schedule a follow-up appointment AS NEEDED.

## 2019-11-05 ENCOUNTER — Other Ambulatory Visit: Payer: Self-pay | Admitting: Family Medicine

## 2019-11-05 DIAGNOSIS — I152 Hypertension secondary to endocrine disorders: Secondary | ICD-10-CM

## 2019-11-05 DIAGNOSIS — E1159 Type 2 diabetes mellitus with other circulatory complications: Secondary | ICD-10-CM

## 2019-11-05 NOTE — Telephone Encounter (Signed)
Pt just saw cardiology for bp. I will defer to them

## 2019-11-09 ENCOUNTER — Ambulatory Visit: Payer: Self-pay

## 2019-11-09 ENCOUNTER — Ambulatory Visit (INDEPENDENT_AMBULATORY_CARE_PROVIDER_SITE_OTHER): Payer: 59 | Admitting: Orthopaedic Surgery

## 2019-11-09 ENCOUNTER — Encounter: Payer: Self-pay | Admitting: Orthopaedic Surgery

## 2019-11-09 VITALS — Ht 63.5 in | Wt 255.0 lb

## 2019-11-09 DIAGNOSIS — M545 Low back pain, unspecified: Secondary | ICD-10-CM

## 2019-11-09 DIAGNOSIS — M25551 Pain in right hip: Secondary | ICD-10-CM

## 2019-11-09 DIAGNOSIS — M25552 Pain in left hip: Secondary | ICD-10-CM | POA: Diagnosis not present

## 2019-11-09 DIAGNOSIS — M654 Radial styloid tenosynovitis [de Quervain]: Secondary | ICD-10-CM

## 2019-11-09 MED ORDER — DICLOFENAC SODIUM 1 % EX GEL: 2.0000 g | Freq: Four times a day (QID) | CUTANEOUS | 2 refills | Status: DC
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
## 2019-11-10 LAB — HM DIABETES EYE EXAM

## 2019-11-12 ENCOUNTER — Encounter (INDEPENDENT_AMBULATORY_CARE_PROVIDER_SITE_OTHER): Payer: 59 | Admitting: Ophthalmology

## 2019-11-12 ENCOUNTER — Other Ambulatory Visit: Payer: Self-pay

## 2019-11-12 DIAGNOSIS — I10 Essential (primary) hypertension: Secondary | ICD-10-CM | POA: Diagnosis not present

## 2019-11-12 DIAGNOSIS — E113513 Type 2 diabetes mellitus with proliferative diabetic retinopathy with macular edema, bilateral: Secondary | ICD-10-CM | POA: Diagnosis not present

## 2019-11-12 DIAGNOSIS — H35033 Hypertensive retinopathy, bilateral: Secondary | ICD-10-CM

## 2019-11-12 DIAGNOSIS — E11311 Type 2 diabetes mellitus with unspecified diabetic retinopathy with macular edema: Secondary | ICD-10-CM | POA: Diagnosis not present

## 2019-11-12 DIAGNOSIS — H43813 Vitreous degeneration, bilateral: Secondary | ICD-10-CM

## 2019-11-15 ENCOUNTER — Other Ambulatory Visit: Payer: Self-pay | Admitting: Family Medicine

## 2019-11-15 DIAGNOSIS — I152 Hypertension secondary to endocrine disorders: Secondary | ICD-10-CM

## 2019-11-15 DIAGNOSIS — E1159 Type 2 diabetes mellitus with other circulatory complications: Secondary | ICD-10-CM

## 2019-11-15 NOTE — Telephone Encounter (Signed)
Per cardiology. Follow-up with PCP since meds are well managed. Cardiology will see her on as needed basic

## 2019-11-17 ENCOUNTER — Encounter: Payer: Self-pay | Admitting: Family Medicine

## 2019-11-18 ENCOUNTER — Encounter: Payer: Self-pay | Admitting: Pulmonary Disease

## 2019-11-18 ENCOUNTER — Ambulatory Visit (INDEPENDENT_AMBULATORY_CARE_PROVIDER_SITE_OTHER): Payer: 59 | Admitting: Pulmonary Disease

## 2019-11-18 ENCOUNTER — Other Ambulatory Visit: Payer: Self-pay

## 2019-11-18 DIAGNOSIS — G4733 Obstructive sleep apnea (adult) (pediatric): Secondary | ICD-10-CM | POA: Diagnosis not present

## 2019-11-18 DIAGNOSIS — D869 Sarcoidosis, unspecified: Secondary | ICD-10-CM | POA: Diagnosis not present

## 2019-11-18 DIAGNOSIS — I2609 Other pulmonary embolism with acute cor pulmonale: Secondary | ICD-10-CM

## 2019-11-18 NOTE — Assessment & Plan Note (Signed)
LFTs noted normal 09/2019. Suggest annual LFTs

## 2019-11-18 NOTE — Assessment & Plan Note (Signed)
She has been unable to tolerate CPAP and is really not using. We will discontinue CPAP. We discussed that cardiovascular implications of mild OSA are minimal

## 2019-11-18 NOTE — Patient Instructions (Signed)
  Discontinue CPAP since you are unable to tolerate. Stay on Xarelto lifelong.  Encourage you to take the Covid vaccine

## 2019-11-18 NOTE — Assessment & Plan Note (Signed)
Given the submassive PE in 2020 and unprovoked nature, lifelong anticoagulation recommended  We discussed her reasons for vaccine hesitancy and I tried to answer her questions and strongly recommended that she get vaccinated

## 2019-11-18 NOTE — Progress Notes (Signed)
   Subjective:    Patient ID: Victoria Padilla, female    DOB: [birth year], 54 y.o.   MRN: [id number]  HPI  54 year old never smoker for follow-up of OSA, sarcoidosis and PE/ BLE DVT  07/2018  Sarcoidosis was diagnosed 2010 by liver biopsy.  She is a lifetime nondrinker, but imaging suggested cirrhosis  Chief Complaint  Patient presents with  . Follow-up    Patient had some scans in ED on 07/2018. Has not been wearing CPAP due to not being able to keep her mouth closed. Has not been sleeping good   After initial office visit 09/2018 we placed her on auto CPAP with nasal pillows.  She did not tolerate this well and tried a full facemask. She had a televisit 01/2019 She has really not tolerate the CPAP and has not used it much in the last few months. She remains on Xarelto and is tolerating this well has slight lymphedema, on amlodipine. We reviewed repeat venous duplex 12/2018 which was negative.  She has several questions about sarcoidosis. We discussed her vaccine hesitancy   Significant tests/ events reviewed  02/2018 HST mild OSA AHI 10/h  CT angiogram 07/2018 >> bilateral PE, lateral calcified mediastinal and hilar lymph nodes, nodular liver contour consistent with cirrhosis 07/2018 venous duplex BLE DVT 12/2018 no DVT  Review of Systems neg for any significant sore throat, dysphagia, itching, sneezing, nasal congestion or excess/ purulent secretions, fever, chills, sweats, unintended wt loss, pleuritic or exertional cp, hempoptysis, orthopnea pnd or change in chronic leg swelling. Also denies presyncope, palpitations, heartburn, abdominal pain, nausea, vomiting, diarrhea or change in bowel or urinary habits, dysuria,hematuria, rash, arthralgias, visual complaints, headache, numbness weakness or ataxia.     Objective:   Physical Exam  Gen. Pleasant, obese, in no distress ENT - no lesions, no post nasal drip Neck: No JVD, no thyromegaly, no carotid bruits Lungs: no use of  accessory muscles, no dullness to percussion, decreased without rales or rhonchi  Cardiovascular: Rhythm regular, heart sounds  normal, no murmurs or gallops, 1+ peripheral edema Musculoskeletal: No deformities, no cyanosis or clubbing , no tremors       Assessment & Plan:

## 2019-12-02 ENCOUNTER — Other Ambulatory Visit: Payer: Self-pay

## 2019-12-02 ENCOUNTER — Encounter: Payer: Self-pay | Admitting: Nurse Practitioner

## 2019-12-02 ENCOUNTER — Ambulatory Visit (INDEPENDENT_AMBULATORY_CARE_PROVIDER_SITE_OTHER): Payer: 59 | Admitting: Nurse Practitioner

## 2019-12-02 VITALS — BP 125/80 | Ht 63.0 in | Wt 254.0 lb

## 2019-12-02 DIAGNOSIS — N898 Other specified noninflammatory disorders of vagina: Secondary | ICD-10-CM | POA: Diagnosis not present

## 2019-12-02 DIAGNOSIS — Z01419 Encounter for gynecological examination (general) (routine) without abnormal findings: Secondary | ICD-10-CM | POA: Diagnosis not present

## 2019-12-02 DIAGNOSIS — L900 Lichen sclerosus et atrophicus: Secondary | ICD-10-CM

## 2019-12-02 DIAGNOSIS — N939 Abnormal uterine and vaginal bleeding, unspecified: Secondary | ICD-10-CM | POA: Diagnosis not present

## 2019-12-02 DIAGNOSIS — R232 Flushing: Secondary | ICD-10-CM | POA: Diagnosis not present

## 2019-12-02 DIAGNOSIS — B373 Candidiasis of vulva and vagina: Secondary | ICD-10-CM

## 2019-12-02 DIAGNOSIS — R1032 Left lower quadrant pain: Secondary | ICD-10-CM | POA: Diagnosis not present

## 2019-12-02 DIAGNOSIS — B3731 Acute candidiasis of vulva and vagina: Secondary | ICD-10-CM

## 2019-12-02 DIAGNOSIS — N3946 Mixed incontinence: Secondary | ICD-10-CM

## 2019-12-02 LAB — WET PREP FOR TRICH, YEAST, CLUE

## 2019-12-02 LAB — FOLLICLE STIMULATING HORMONE: FSH: 32.8 m[IU]/mL

## 2019-12-02 MED ORDER — CLOBETASOL PROPIONATE 0.05 % EX OINT: 1.0000 "application " | TOPICAL_OINTMENT | Freq: Two times a day (BID) | CUTANEOUS | 2 refills | Status: DC

## 2019-12-02 MED ORDER — FLUCONAZOLE 150 MG PO TABS: 150.0000 mg | ORAL_TABLET | ORAL | 0 refills | Status: DC

## 2019-12-02 NOTE — Progress Notes (Signed)
Victoria Padilla 09/12/1965 [id number]   History:  54 y.o. G3P3003 presents for annual exam. Complains of vaginal discharge for 2 weeks that is clear with itching, no odor. The itching is most of the time and is not new for her. She also complains of urinary leakage that is not new for her but has worsened over the last few years. It occurs at all times. She complains of left-sided lower abdominal pain and bloating for 2 months. The pain is described as cramping and sharp and at times feels like pressure in her vaginal area. Each episode lasts a couple of hours to a day. 1998 cryo, subsequent paps normal. 2019 benign left breast biopsy. Has had a cycle this year that lasted 7 days, prior to this it had been 10-11 months. The bleeding was like a period with PMS symptoms.  History of PE on Xarelto. Endocrinology manages diabetes.   Gynecologic History No LMP recorded. (Menstrual status: Perimenopausal).   Contraception: abstinence Last Pap: 04/23/2017. Results were: normal Last mammogram: 12/08/2018. Results were: normal Last colonoscopy: 04/15/2016. Results were: lipoma, diverticula. 5 year follow up recommended   Past medical history, past surgical history, family history and social history were all reviewed and documented in the EPIC chart.  ROS:  A ROS was performed and pertinent positives and negatives are included.  Exam:  Vitals:   12/02/19 1447  BP: 125/80  Weight: 254 lb (115.2 kg)  Height: 5\' 3"  (1.6 m)   Body mass index is 44.99 kg/m.  General appearance:  Normal Thyroid:  Symmetrical, normal in size, without palpable masses or nodularity. Respiratory  Auscultation:  Clear without wheezing or rhonchi Cardiovascular  Auscultation:  Regular rate, without rubs, murmurs or gallops  Edema/varicosities:  Not grossly evident Abdominal  Soft,nontender, without masses, guarding or rebound.  Liver/spleen:  No organomegaly noted  Hernia:  None appreciated  Skin  Inspection:   Grossly normal   Breasts: Examined lying and sitting.   Right: Without masses, retractions, discharge or axillary adenopathy.   Left: Without masses, retractions, discharge or axillary adenopathy. Gentitourinary   Inguinal/mons:  Normal without inguinal adenopathy  External genitalia:  Thin, pearly skin consistent with LC. Excoriation from scratching  BUS/Urethra/Skene's glands:  Normal  Vagina:  Normal  Cervix:  Normal  Uterus:  Difficult to palpate due to body habitus but no gross masses or tenderness  Adnexa/parametria:     Rt: Without masses or tenderness.   Lt: Without masses or tenderness.  Anus and perineum: Normal  Digital rectal exam: Normal sphincter tone without palpated masses or tenderness  Assessment/Plan:  54 y.o. [id number] for annual exam.   Well female exam with routine gynecological exam - Education provided on SBEs, importance of preventative screenings, current guidelines, high calcium diet, regular exercise, and multivitamin daily. Labs with primary care.   Left lower quadrant abdominal pain - Plan: Urinalysis w microscopic + reflex cultur, US PELVIS TRANSVAGINAL NON-OB (TV ONLY). Pain lasts a couple of hours to a day, intermittent, sharp/crampy.   Mixed incontinence -this is not new for her and occurs at all times.  Recommend avoiding caffeine, alcohol, and carbonated drinks.  Kegel exercises 3 times per day holding for 8 to 10 seconds and core exercises to strengthen pelvic floor muscles.  Change pads frequently to avoid infection.  We also discussed benefits of weight loss.  She is interested in a referral to a urogynecologist for management.  Vaginal discharge - Plan: [place], CLUE.  Wet prep  positive for yeast.  Abnormal uterine bleeding - Plan: Follicle stimulating hormone, US PELVIS TRANSVAGINAL NON-OB (TV ONLY).  Has had 2 episodes of bleeding in the last 2 years with hot flashes.   Hot flashes - Plan: Follicle stimulating hormone  Lichen  sclerosus - Plan: clobetasol ointment (TEMOVATE) 0.05 % twice a day for 4 weeks, then once a day for 4 weeks, then twice weekly.  Vaginal candidiasis - Plan: fluconazole (DIFLUCAN) 150 MG tablet today and repeat in 2 days for total of 2 doses.  Follow-up in 1 year for annual.      Tamela Gammon [facility], 3:07 PM 12/02/2019

## 2019-12-02 NOTE — Patient Instructions (Addendum)
Health Maintenance for Postmenopausal Women Menopause is a normal process in which your ability to get pregnant comes to an end. This process happens slowly over many months or years, usually between the ages of 48 and 55. Menopause is complete when you have missed your menstrual periods for 12 months. It is important to talk with your health care provider about some of the most common conditions that affect women after menopause (postmenopausal women). These include heart disease, cancer, and bone loss (osteoporosis). Adopting a healthy lifestyle and getting preventive care can help to promote your health and wellness. The actions you take can also lower your chances of developing some of these common conditions. What should I know about menopause? During menopause, you may get a number of symptoms, such as:  Hot flashes. These can be moderate or severe.  Night sweats.  Decrease in sex drive.  Mood swings.  Headaches.  Tiredness.  Irritability.  Memory problems.  Insomnia. Choosing to treat or not to treat these symptoms is a decision that you make with your health care provider. Do I need hormone replacement therapy?  Hormone replacement therapy is effective in treating symptoms that are caused by menopause, such as hot flashes and night sweats.  Hormone replacement carries certain risks, especially as you become older. If you are thinking about using estrogen or estrogen with progestin, discuss the benefits and risks with your health care provider. What is my risk for heart disease and stroke? The risk of heart disease, heart attack, and stroke increases as you age. One of the causes may be a change in the body's hormones during menopause. This can affect how your body uses dietary fats, triglycerides, and cholesterol. Heart attack and stroke are medical emergencies. There are many things that you can do to help prevent heart disease and stroke. Watch your blood pressure  High  blood pressure causes heart disease and increases the risk of stroke. This is more likely to develop in people who have high blood pressure readings, are of African descent, or are overweight.  Have your blood pressure checked: ? Every 3-5 years if you are 18-39 years of age. ? Every year if you are 40 years old or older. Eat a healthy diet   Eat a diet that includes plenty of vegetables, fruits, low-fat dairy products, and lean protein.  Do not eat a lot of foods that are high in solid fats, added sugars, or sodium. Get regular exercise Get regular exercise. This is one of the most important things you can do for your health. Most adults should:  Try to exercise for at least 150 minutes each week. The exercise should increase your heart rate and make you sweat (moderate-intensity exercise).  Try to do strengthening exercises at least twice each week. Do these in addition to the moderate-intensity exercise.  Spend less time sitting. Even light physical activity can be beneficial. Other tips  Work with your health care provider to achieve or maintain a healthy weight.  Do not use any products that contain nicotine or tobacco, such as cigarettes, e-cigarettes, and chewing tobacco. If you need help quitting, ask your health care provider.  Know your numbers. Ask your health care provider to check your cholesterol and your blood sugar (glucose). Continue to have your blood tested as directed by your health care provider. Do I need screening for cancer? Depending on your health history and family history, you may need to have cancer screening at different stages of your life. This   may include screening for:  Breast cancer.  Cervical cancer.  Lung cancer.  Colorectal cancer. What is my risk for osteoporosis? After menopause, you may be at increased risk for osteoporosis. Osteoporosis is a condition in which bone destruction happens more quickly than new bone creation. To help prevent  osteoporosis or the bone fractures that can happen because of osteoporosis, you may take the following actions:  If you are 67-33 years old, get at least 1,000 mg of calcium and at least 600 mg of vitamin D per day.  If you are older than age 39 but younger than age 69, get at least 1,200 mg of calcium and at least 600 mg of vitamin D per day.  If you are older than age 62, get at least 1,200 mg of calcium and at least 800 mg of vitamin D per day. Smoking and drinking excessive alcohol increase the risk of osteoporosis. Eat foods that are rich in calcium and vitamin D, and do weight-bearing exercises several times each week as directed by your health care provider. How does menopause affect my mental health? Depression may occur at any age, but it is more common as you become older. Common symptoms of depression include:  Low or sad mood.  Changes in sleep patterns.  Changes in appetite or eating patterns.  Feeling an overall lack of motivation or enjoyment of activities that you previously enjoyed.  Frequent crying spells. Talk with your health care provider if you think that you are experiencing depression. General instructions See your health care provider for regular wellness exams and vaccines. This may include:  Scheduling regular health, dental, and eye exams.  Getting and maintaining your vaccines. These include: ? Influenza vaccine. Get this vaccine each year before the flu season begins. ? Pneumonia vaccine. ? Shingles vaccine. ? Tetanus, diphtheria, and pertussis (Tdap) booster vaccine. Your health care provider may also recommend other immunizations. Tell your health care provider if you have ever been abused or do not feel safe at home. Summary  Menopause is a normal process in which your ability to get pregnant comes to an end.  This condition causes hot flashes, night sweats, decreased interest in sex, mood swings, headaches, or lack of sleep.  Treatment for this  condition may include hormone replacement therapy.  Take actions to keep yourself healthy, including exercising regularly, eating a healthy diet, watching your weight, and checking your blood pressure and blood sugar levels.  Get screened for cancer and depression. Make sure that you are up to date with all your vaccines. This information is not intended to replace advice given to you by your health care provider. Make sure you discuss any questions you have with your health care provider. Document Revised: 02/04/2018 Document Reviewed: 02/04/2018 Elsevier Patient Education  69[employer]  Lichen Sclerosus Lichen sclerosus is a skin problem. It can happen on any part of the body, but it commonly involves the anal or genital areas. It can cause itching and discomfort in these areas. Treatment can help to control symptoms. When the genital area is affected, getting treatment is important because the condition can cause scarring that may lead to other problems. What are the causes? The cause of this condition is not known. It may be related to an overactive immune system or a lack of certain hormones. Lichen sclerosus is not an infection or a fungus, and it is not passed from one person to another (not contagious). What increases the risk? This condition is more  likely to develop in women, usually after menopause. What are the signs or symptoms? Symptoms of this condition include:  Thin, wrinkled, white areas on the skin.  Thickened white areas on the skin.  Red and swollen patches (lesions) on the skin.  Tears or cracks in the skin.  Bruising.  Blood blisters.  Severe itching.  Pain, itching, or burning when urinating. Constipation is also common in people with lichen sclerosus. How is this diagnosed? This condition may be diagnosed with a physical exam. In some cases, a tissue sample (biopsy sample) may be removed to be looked at under a microscope. How is this treated? This  condition is usually treated with medicated creams or ointments (topical steroids) that are applied over the affected areas. In some cases, treatment may also include medicines that are taken by mouth. Surgery may be needed in more severe cases that are causing problems such as scarring. Follow these instructions at home:  Take or use over-the-counter and prescription medicines only as told by your health care provider.  Use creams or ointments as told by your health care provider.  Do not scratch the affected areas of skin.  If you are a woman, be sure to keep the vaginal area as clean and dry as possible.  Clean the affected area of skin gently with water. Avoid using rough towels or toilet paper.  Keep all follow-up visits as told by your health care provider. This is important. Contact a health care provider if:  You have increasing redness, swelling, or pain in the affected area.  You have fluid, blood, or pus coming from the affected area.  You have new lesions on your skin.  You have a fever.  You have pain during sex. Summary  Lichen sclerosus is a skin problem. When the genital area is affected, getting treatment is important because the condition can cause scarring that may lead to other problems.  This condition is usually treated with medicated creams or ointments (topical steroids) that are applied over the affected areas.  Take or use over-the-counter and prescription medicines only as told by your health care provider.  Contact a health care provider if you have new lesions on your skin, have pain during sex, or have increasing redness, swelling, or pain in the affected area.  Keep all follow-up visits as told by your health care provider. This is important. This information is not intended to replace advice given to you by your health care provider. Make sure you discuss any questions you have with your health care provider. Document Revised: 06/26/2017 Document  Reviewed: 06/26/2017 Elsevier Patient Education  [place].

## 2019-12-03 ENCOUNTER — Other Ambulatory Visit: Payer: Self-pay | Admitting: Family Medicine

## 2019-12-03 DIAGNOSIS — E1159 Type 2 diabetes mellitus with other circulatory complications: Secondary | ICD-10-CM

## 2019-12-03 DIAGNOSIS — I152 Hypertension secondary to endocrine disorders: Secondary | ICD-10-CM

## 2019-12-03 LAB — URINALYSIS W MICROSCOPIC + REFLEX CULTURE
Bacteria, UA: NONE SEEN /HPF
Bilirubin Urine: NEGATIVE
Hgb urine dipstick: NEGATIVE
Hyaline Cast: NONE SEEN /LPF
Ketones, ur: NEGATIVE
Leukocyte Esterase: NEGATIVE
Nitrites, Initial: NEGATIVE
Protein, ur: NEGATIVE
Specific Gravity, Urine: 1.031 (ref 1.001–1.03)
pH: <=5 (ref 5.0–8.0)

## 2019-12-03 LAB — NO CULTURE INDICATED

## 2019-12-05 ENCOUNTER — Other Ambulatory Visit: Payer: Self-pay | Admitting: Cardiovascular Disease

## 2019-12-08 ENCOUNTER — Telehealth: Payer: Self-pay | Admitting: *Deleted

## 2019-12-08 NOTE — Telephone Encounter (Signed)
-----   Message from Tamela Gammon, NP sent at 12/02/2019  5:00 PM EDT ----- She would like a referral to a Urogynecology specialist for mixed incontinence. I want to refer her to the new provider Dr. Sherlene Shams that will be at the [facility]. This provider does not start until 10/18 but they said to send refers to [email or web address] with subject line SECURE URO/GYN. Thank you!

## 2019-12-08 NOTE — Telephone Encounter (Signed)
I sent e-mail for the below,was informed message will passed to referral scheduler and she will call to schedule patient with physician.

## 2019-12-09 ENCOUNTER — Other Ambulatory Visit: Payer: Self-pay

## 2019-12-09 ENCOUNTER — Encounter: Payer: Self-pay | Admitting: Nurse Practitioner

## 2019-12-09 ENCOUNTER — Ambulatory Visit (INDEPENDENT_AMBULATORY_CARE_PROVIDER_SITE_OTHER): Payer: 59 | Admitting: Nurse Practitioner

## 2019-12-09 ENCOUNTER — Ambulatory Visit (INDEPENDENT_AMBULATORY_CARE_PROVIDER_SITE_OTHER): Payer: 59

## 2019-12-09 VITALS — BP 142/80

## 2019-12-09 DIAGNOSIS — N939 Abnormal uterine and vaginal bleeding, unspecified: Secondary | ICD-10-CM

## 2019-12-09 DIAGNOSIS — R9389 Abnormal findings on diagnostic imaging of other specified body structures: Secondary | ICD-10-CM

## 2019-12-09 DIAGNOSIS — R1032 Left lower quadrant pain: Secondary | ICD-10-CM

## 2019-12-09 DIAGNOSIS — D251 Intramural leiomyoma of uterus: Secondary | ICD-10-CM

## 2019-12-09 NOTE — Patient Instructions (Signed)
Endometrial Biopsy  Endometrial biopsy is a procedure in which a tissue sample is taken from inside the uterus. The sample is taken from the endometrium, which is the lining of the uterus. The tissue sample is then checked under a microscope to see if the tissue is normal or abnormal. This procedure helps to determine where you are in your menstrual cycle and how hormone levels are affecting the lining of the uterus. This procedure may also be used to evaluate uterine bleeding or to diagnose endometrial cancer, endometrial tuberculosis, polyps, or other inflammatory conditions. Tell a health care provider about:  Any allergies you have.  All medicines you are taking, including vitamins, herbs, eye drops, creams, and over-the-counter medicines.  Any problems you or family members have had with anesthetic medicines.  Any blood disorders you have.  Any surgeries you have had.  Any medical conditions you have.  Whether you are pregnant or may be pregnant. What are the risks? Generally, this is a safe procedure. However, problems may occur, including:  Bleeding.  Pelvic infection.  Puncture of the wall of the uterus with the biopsy device (rare). What happens before the procedure?  Keep a record of your menstrual cycles as told by your health care provider. You may need to schedule your procedure for a specific time in your cycle.  You may want to bring a sanitary pad to wear after the procedure.  Ask your health care provider about: ? Changing or stopping your regular medicines. This is especially important if you are taking diabetes medicines or blood thinners. ? Taking medicines such as aspirin and ibuprofen. These medicines can thin your blood. Do not take these medicines before your procedure if your health care provider instructs you not to.  Plan to have someone take you home from the hospital or clinic. What happens during the procedure?  To lower your risk of  infection: ? Your health care team will wash or sanitize their hands.  You will lie on an exam table with your feet and legs supported as in a pelvic exam.  Your health care provider will insert an instrument (speculum) into your vagina to see your cervix.  Your cervix will be cleansed with an antiseptic solution.  A medicine (local anesthetic) will be used to numb the cervix.  A forceps instrument (tenaculum) will be used to hold your cervix steady for the biopsy.  A thin, rod-like instrument (uterine sound) will be inserted through your cervix to determine the length of your uterus and the location where the biopsy sample will be removed.  A thin, flexible tube (catheter) will be inserted through your cervix and into the uterus. The catheter will be used to collect the biopsy sample from your endometrial tissue.  The catheter and speculum will then be removed, and the tissue sample will be sent to a lab for examination. What happens after the procedure?  You will rest in a recovery area until you are ready to go home.  You may have mild cramping and a small amount of vaginal bleeding. This is normal.  It is up to you to get the results of your procedure. Ask your health care provider, or the department that is doing the procedure, when your results will be ready. Summary  Endometrial biopsy is a procedure in which a tissue sample is taken from the endometrium, which is the lining of the uterus.  This procedure may help to diagnose menstrual cycle problems, abnormal bleeding, or other conditions affecting   the endometrium.  Before the procedure, keep a record of your menstrual cycles as told by your health care provider.  The tissue sample that is removed will be checked under a microscope to see if it is normal or abnormal. This information is not intended to replace advice given to you by your health care provider. Make sure you discuss any questions you have with your health care  provider. Document Revised: 01/24/2017 Document Reviewed: 02/28/2016 Elsevier Patient Education  2020 Elsevier Inc.  

## 2019-12-09 NOTE — Progress Notes (Signed)
History: 54 year old presents to discuss ultrasound.  Was seen by me 12/02/2019 with complaints of left-sided lower abdominal pain and bloating for 2 months.  The pain is described as cramping and sharp and at times feels like pressure in her lower pelvis.  These episodes last a couple of hours to a day.  She had a cycle in January that lasted 7 days with bleeding that was like a period with PMS symptoms.  Prior to this it had been 11 months since she had any bleeding. [facility] 12/02/2019 was 32.8.   Exam: Appears well Ultrasound: Anteverted uterus, enlarged, fibroids with largest 61 x 46 mm, intramural, other degenerative fibroids seen.  Endometrium with possible encroachment of fibroid, no masses or thickening seen 5.7 mm.  Both ovaries seen, right ovary with difficult visualization due to location, body habitus, and fibroid.  No adnexal masses, no free fluid.  Assessment Fibroids, intramural Thickened endometrium - 5.7 mm Left lower quadrant abdominal pain  Plan: Discussed ultrasound and findings of mildly thickened endometrium and fibroids.  Reassurance that pain is most likely not from fibroid and that typically these decrease in size once patient is postmenopausal. We will schedule an endometrial biopsy with Dr. Dellis Filbert.  Education provided on procedure and what to expect.  Recommend Tylenol for pain as needed.  If pain does not improve as she progresses through menopause I recommend follow-up with GI.  She has screening colonoscopy scheduled for November 3.  She is agreeable to plan.

## 2019-12-10 ENCOUNTER — Encounter (INDEPENDENT_AMBULATORY_CARE_PROVIDER_SITE_OTHER): Payer: 59 | Admitting: Ophthalmology

## 2019-12-10 DIAGNOSIS — H43813 Vitreous degeneration, bilateral: Secondary | ICD-10-CM

## 2019-12-10 DIAGNOSIS — E113513 Type 2 diabetes mellitus with proliferative diabetic retinopathy with macular edema, bilateral: Secondary | ICD-10-CM | POA: Diagnosis not present

## 2019-12-10 DIAGNOSIS — H35033 Hypertensive retinopathy, bilateral: Secondary | ICD-10-CM

## 2019-12-10 DIAGNOSIS — E11311 Type 2 diabetes mellitus with unspecified diabetic retinopathy with macular edema: Secondary | ICD-10-CM | POA: Diagnosis not present

## 2019-12-10 DIAGNOSIS — I10 Essential (primary) hypertension: Secondary | ICD-10-CM | POA: Diagnosis not present

## 2019-12-13 ENCOUNTER — Other Ambulatory Visit: Payer: Self-pay | Admitting: Family Medicine

## 2019-12-13 ENCOUNTER — Ambulatory Visit: Payer: 59 | Admitting: Obstetrics & Gynecology

## 2019-12-14 ENCOUNTER — Ambulatory Visit: Payer: 59 | Admitting: Endocrinology

## 2019-12-14 ENCOUNTER — Other Ambulatory Visit: Payer: Self-pay | Admitting: Family Medicine

## 2019-12-14 DIAGNOSIS — E119 Type 2 diabetes mellitus without complications: Secondary | ICD-10-CM

## 2019-12-14 DIAGNOSIS — K219 Gastro-esophageal reflux disease without esophagitis: Secondary | ICD-10-CM

## 2019-12-16 ENCOUNTER — Encounter: Payer: Self-pay | Admitting: Obstetrics & Gynecology

## 2019-12-16 ENCOUNTER — Ambulatory Visit (INDEPENDENT_AMBULATORY_CARE_PROVIDER_SITE_OTHER): Payer: 59 | Admitting: Obstetrics & Gynecology

## 2019-12-16 ENCOUNTER — Other Ambulatory Visit: Payer: Self-pay

## 2019-12-16 ENCOUNTER — Other Ambulatory Visit: Payer: Self-pay | Admitting: Endocrinology

## 2019-12-16 ENCOUNTER — Ambulatory Visit (INDEPENDENT_AMBULATORY_CARE_PROVIDER_SITE_OTHER): Payer: 59 | Admitting: Endocrinology

## 2019-12-16 VITALS — BP 120/78

## 2019-12-16 VITALS — BP 142/82 | HR 80 | Ht 63.0 in | Wt 255.6 lb

## 2019-12-16 DIAGNOSIS — D251 Intramural leiomyoma of uterus: Secondary | ICD-10-CM | POA: Diagnosis not present

## 2019-12-16 DIAGNOSIS — E119 Type 2 diabetes mellitus without complications: Secondary | ICD-10-CM

## 2019-12-16 DIAGNOSIS — R9389 Abnormal findings on diagnostic imaging of other specified body structures: Secondary | ICD-10-CM

## 2019-12-16 DIAGNOSIS — IMO0002 Reserved for concepts with insufficient information to code with codable children: Secondary | ICD-10-CM

## 2019-12-16 DIAGNOSIS — E11319 Type 2 diabetes mellitus with unspecified diabetic retinopathy without macular edema: Secondary | ICD-10-CM | POA: Diagnosis not present

## 2019-12-16 DIAGNOSIS — E1165 Type 2 diabetes mellitus with hyperglycemia: Secondary | ICD-10-CM | POA: Diagnosis not present

## 2019-12-16 LAB — POCT GLYCOSYLATED HEMOGLOBIN (HGB A1C): Hemoglobin A1C: 9.7 % — AB (ref 4.0–5.6)

## 2019-12-16 MED ORDER — TRULICITY 0.75 MG/0.5ML ~~LOC~~ SOAJ: 0.7500 mg | SUBCUTANEOUS | 3 refills | Status: DC

## 2019-12-16 MED ORDER — FREESTYLE LIBRE 2 READER DEVI: 1.0000 | Freq: Once | 1 refills | Status: AC

## 2019-12-16 MED ORDER — FREESTYLE LIBRE 2 SENSOR MISC: 1.0000 | 3 refills | Status: DC

## 2019-12-16 MED ORDER — DAPAGLIFLOZIN PROPANEDIOL 10 MG PO TABS: 10.0000 mg | ORAL_TABLET | Freq: Every day | ORAL | 3 refills | Status: DC

## 2019-12-16 MED ORDER — LANTUS SOLOSTAR 100 UNIT/ML ~~LOC~~ SOPN: 40.0000 [IU] | PEN_INJECTOR | SUBCUTANEOUS | 3 refills | Status: DC

## 2019-12-16 NOTE — Progress Notes (Signed)
Subjective:    Patient ID: Victoria Padilla, female    DOB: 1965-08-13, 54 y.o.   MRN: [id number]  HPI Pt returns for f/u of diabetes mellitus: DM type: Insulin-requiring type 2 Dx'ed: 6440 Complications: DR Therapy: insulin since 2019, and 2 oral meds.   GDM: 1998, but DM persisted after the pregnancy.   DKA: never Severe hypoglycemia: never Pancreatitis: never Pancreatic imaging: normal on 2017 [place].  Other: she declined multiple daily injections.  she took Trulicity for a few months in 2016, but ins declined to continue; chronic abd pain and nausea have limited rx options.  she also has thyroid pseudonodule.   Interval history: pt does not check cbg's.  pt states she feels well in general.  Pt says she never misses meds.  Past Medical History:  Diagnosis Date  . Acid reflux   . Anemia   . Arthritis   . Asthma   . Cervical dysplasia   . Diabetes mellitus   . DVT (deep venous thrombosis) ([place])   . Gallbladder problem   . GERD (gastroesophageal reflux disease)   . Heart murmur   . Hx of blood clots   . Hyperlipidemia   . Hypertension   . LBP (low back pain)   . Liver problem   . Obesity   . OSA (obstructive sleep apnea) 03/22/2018   New diagnosis. Mild.   . Pulmonary embolism ([place])   . Sarcoidosis   . Sleep apnea   . Vitamin D deficiency     Past Surgical History:  Procedure Laterality Date  . BREAST BIOPSY Left 2019  . BUNIONECTOMY    . CHOLECYSTECTOMY    . COLPOSCOPY    . GYNECOLOGIC CRYOSURGERY    . KNEE ARTHROSCOPY    . TUBAL LIGATION      Social History   Socioeconomic History  . Marital status: Single    Spouse name: Not on file  . Number of children: 3  . Years of education: Not on file  . Highest education level: Not on file  Occupational History  . Occupation: Careers adviser: [employer]  Tobacco Use  . Smoking status: Never Smoker  . Smokeless tobacco: Never Used  Vaping Use  . Vaping Use: Never used  Substance and  Sexual Activity  . Alcohol use: No  . Drug use: No  . Sexual activity: Not Currently    Birth control/protection: Surgical  Other Topics Concern  . Not on file  Social History Narrative  . Not on file   Social Determinants of Health   Financial Resource Strain:   . Difficulty of Paying Living Expenses: Not on file  Food Insecurity:   . Worried About Charity fundraiser in the Last Year: Not on file  . Ran Out of Food in the Last Year: Not on file  Transportation Needs:   . Lack of Transportation (Medical): Not on file  . Lack of Transportation (Non-Medical): Not on file  Physical Activity:   . Days of Exercise per Week: Not on file  . Minutes of Exercise per Session: Not on file  Stress:   . Feeling of Stress : Not on file  Social Connections:   . Frequency of Communication with Friends and Family: Not on file  . Frequency of Social Gatherings with Friends and Family: Not on file  . Attends Religious Services: Not on file  . Active Member of Clubs or Organizations: Not on file  . Attends Club  or Organization Meetings: Not on file  . Marital Status: Not on file  Intimate Partner Violence:   . Fear of Current or Ex-Partner: Not on file  . Emotionally Abused: Not on file  . Physically Abused: Not on file  . Sexually Abused: Not on file    Current Outpatient Medications on File Prior to Visit  Medication Sig Dispense Refill  . amLODipine (NORVASC) 5 MG tablet Take 1 tablet (5 mg total) by mouth daily. 90 tablet 1  . B-D ULTRAFINE III SHORT PEN 31G X 8 MM MISC USE TO INJECT BASAGLAR DAILY 100 each 11  . benazepril (LOTENSIN) 40 MG tablet TAKE 1 TABLET BY MOUTH EVERY DAY 90 tablet 0  . cetirizine (ZYRTEC) 10 MG tablet Take 10 mg by mouth daily.    . cholecalciferol (VITAMIN D) 1000 units tablet Take 2,000 Units by mouth at bedtime.     . clobetasol ointment (TEMOVATE) 0.34 % Apply 1 application topically 2 (two) times daily. Twice a day x 4 weeks, then once a day x 4 weeks,  then twice a week. 30 g 2  . diclofenac Sodium (VOLTAREN) 1 % GEL Apply 2 g topically 4 (four) times daily. 150 g 2  . glucose blood (ONETOUCH VERIO) test strip Use as instructed 100 each 0  . hydrochlorothiazide (HYDRODIURIL) 25 MG tablet TAKE 1 TABLET BY MOUTH EVERY DAY 90 tablet 3  . metFORMIN (GLUCOPHAGE-XR) 500 MG 24 hr tablet TAKE 1 TABLET BY MOUTH TWICE A DAY 180 tablet 3  . metoprolol tartrate (LOPRESSOR) 25 MG tablet Take 1 tablet (25 mg total) by mouth 2 (two) times daily. 180 tablet 1  . Omega-3 Fatty Acids (FISH OIL PO) Take 1 capsule by mouth at bedtime. Reported on 08/08/2015    . omeprazole (PRILOSEC) 40 MG capsule TAKE 1 CAPSULE BY MOUTH EVERY DAY 90 capsule 1  . ondansetron (ZOFRAN) 4 MG tablet Take 4 mg by mouth every 8 (eight) hours as needed for nausea or vomiting.    . rivaroxaban (XARELTO) 10 MG TABS tablet Take 1 tablet (10 mg total) by mouth daily. 90 tablet 1  . simvastatin (ZOCOR) 20 MG tablet TAKE 1 TABLET BY MOUTH EVERYDAY AT BEDTIME 90 tablet 1   No current facility-administered medications on file prior to visit.    Allergies  Allergen Reactions  . Naprosyn [Naproxen] Nausea Only    Family History  Problem Relation Age of Onset  . Heart failure Mother 6       chf  . Diabetes Mother   . High blood pressure Mother   . Heart disease Mother   . Sudden death Mother   . Kidney disease Mother   . Obesity Mother   . Heart failure Father   . Heart disease Father   . Sudden death Father   . Alcoholism Father   . Cancer Brother 29       prostate  . Hypertension Maternal Uncle   . Hyperlipidemia Brother   . Diabetes Brother   . Colon cancer Maternal Grandfather   . Colon polyps Brother   . Diabetes Brother   . Hyperlipidemia Brother   . Stomach cancer Neg Hx   . Esophageal cancer Neg Hx   . Rectal cancer Neg Hx   . Liver cancer Neg Hx   . Breast cancer Neg Hx     BP (!) 142/82 (BP Location: Left Arm, Patient Position: Sitting, Cuff Size: Normal)    Pulse 80   Ht 5\' 3"  (  1.6 m)   Wt 255 lb 9.6 oz (115.9 kg)   SpO2 96%   BMI 45.28 kg/m    Review of Systems She denies hypoglycemia sxs.    Objective:   Physical Exam VITAL SIGNS:  See vs page GENERAL: no distress Pulses: dorsalis pedis intact bilat.   MSK: no deformity of the feet CV: no leg edema Skin:  no ulcer on the feet.  normal color and temp on the feet. Neuro: sensation is intact to touch on the feet  A1c=9.7%     Assessment & Plan:  Insulin-requiring type 2 DM, with DR: uncontrolled.    Patient Instructions  check your blood sugar 4 times a day: before the 3 meals, and at bedtime.  also check if you have symptoms of your blood sugar being too high or too low.  please keep a record of the readings and bring it to your next appointment here (or you can bring the meter itself).  You can write it on any piece of paper.  please call us sooner if your blood sugar goes below 70, or if you have a lot of readings over 200. I have sent 2 prescriptions to your pharmacy: for the continuous glucose monitor, and to re-try Trulicity.   Please continue the same other diabetes medications.  Please come back for a follow-up appointment in 6 weeks.

## 2019-12-16 NOTE — Progress Notes (Signed)
    Victoria Padilla February 19, 1966 [id number]        54 y.o.  G3P3003   RP: Probable perimenopause with a menstrual like bleeding episode with Endometrial line at 5.7 mm for EBx  HPI: Seen by [place]arn[place] in early October: Left-sided lower abdominal pain and bloating for 2 months.  The pain is described as cramping and sharp and at times feels like pressure in her lower pelvis.  These episodes last a couple of hours to a day.  She had a cycle in January that lasted 7 days with bleeding that was like a period with PMS symptoms.  Prior to this it had been 11 months since she had any bleeding. [facility] 12/02/2019 was 54. Pelvic US 12/09/2019 showed a Uterus with fibroids and normal ovaries with an Endometrial line at 5.7 mm.  No vaginal bleeding since January 2021.  Probably perimenopausal.   OB History  Gravida Para Term Preterm AB Living  3 3 3     3   SAB TAB Ectopic Multiple Live Births               # Outcome Date GA Lbr Len/2nd Weight Sex Delivery Anes PTL Lv  3 Term           2 Term           1 Term             Past medical history,surgical history, problem list, medications, allergies, family history and social history were all reviewed and documented in the EPIC chart.   Directed ROS with pertinent positives and negatives documented in the history of present illness/assessment and plan.  Exam:  Vitals:   12/16/19 1451  BP: 120/78   General appearance:  Normal  Pelvic US 12/09/2019: Anteverted uterus, enlarged, fibroids with largest 61 x 46 mm, intramural, other degenerative fibroids seen  Endometrium with possible encroachment of fibroid, no masses or thickening seen 5.7 mm  Both ovaries seen, right ovary with difficult visualization due to location, body habitus, and fibroid  No adnexal masses No free fluid  Verbal consent for Endometrial Biopsy obtained.  EBx: Vulva normal.  Speculum:  Cervix normal.  Betadine prep.  Hurricane spray.  Tenaculum on anterior lip of  cervix.  Os finder used.  Easy insertion of endometrial biopsy cannula.  Endometrial biopsy on all surfaces with suction.  Mild to moderate specimen.  Specimen sent to pathology.  All instruments removed.  Procedure well-tolerated by patient without complication.   Assessment/Plan:  54 y.o. G3P3L3   1. Thickened endometrium Probably perimenopausal.  Endometrial biopsy performed without complication.  Management per results.  2. Fibroids, intramural Left pelvic pain probably associated with gastrointestinal issues.  Fibroid pain possible in perimenopausal patient.  Should be improving in menopause.  Recommend evaluation with nurse practitioner and possible referral to gastroenterology.  Observation for fibroids.  Other orders - Pathology Report ([facility])  Princess Bruins MD, 3:25 PM 12/16/2019

## 2019-12-16 NOTE — Telephone Encounter (Signed)
Message left message for patient to call to see if Uro-Gyn has reached out to her.

## 2019-12-16 NOTE — Patient Instructions (Addendum)
check your blood sugar 4 times a day: before the 3 meals, and at bedtime.  also check if you have symptoms of your blood sugar being too high or too low.  please keep a record of the readings and bring it to your next appointment here (or you can bring the meter itself).  You can write it on any piece of paper.  please call us sooner if your blood sugar goes below 70, or if you have a lot of readings over 200. I have sent 2 prescriptions to your pharmacy: for the continuous glucose monitor, and to re-try Trulicity.   Please continue the same other diabetes medications.  Please come back for a follow-up appointment in 6 weeks.

## 2019-12-17 ENCOUNTER — Telehealth: Payer: Self-pay | Admitting: Family Medicine

## 2019-12-17 NOTE — Telephone Encounter (Signed)
First of all, I would question whether or not she needs a Covid test.  She can try Claritin or Allegra and if she is having a lot of congestion may try over-the-counter Mucinex.  Mucinex will also help with cough

## 2019-12-17 NOTE — Telephone Encounter (Signed)
please contact patient: Just to make sure you are not taking both basaglar and Lantus--they are the same insulin.  Please let us know.

## 2019-12-17 NOTE — Telephone Encounter (Signed)
Informed pt of Victoria Padilla's instructions and also advised her to contact us back if not improving. She states her allergies get bad this time of year.  She has covid test scheduled for next week because she has upcoming colonoscopy appt

## 2019-12-17 NOTE — Telephone Encounter (Signed)
Pt wants to know what cold meds she can take with Zarelto  She is having cough, stuffy nose, itchy, watery eyes etc

## 2019-12-19 ENCOUNTER — Encounter: Payer: Self-pay | Admitting: Obstetrics & Gynecology

## 2019-12-20 LAB — PATHOLOGY REPORT

## 2019-12-20 LAB — TISSUE SPECIMEN

## 2019-12-20 MED ORDER — BASAGLAR KWIKPEN 100 UNIT/ML ~~LOC~~ SOPN: 40.0000 [IU] | PEN_INJECTOR | Freq: Every day | SUBCUTANEOUS | 0 refills | Status: DC

## 2019-12-20 NOTE — Telephone Encounter (Signed)
Patient called stating she is only taking the [place] - she asked if a nurse could give her a call back to discuss some questions. Ph# [phone]

## 2019-12-21 NOTE — Telephone Encounter (Signed)
I called and left message at [phone] office regarding referral.

## 2019-12-21 NOTE — Telephone Encounter (Signed)
Patient scheduled on 12/28/19 @ 12:45pm  with [place].  Patient informed.

## 2019-12-22 ENCOUNTER — Telehealth: Payer: Self-pay

## 2019-12-22 MED ORDER — BASAGLAR KWIKPEN 100 UNIT/ML ~~LOC~~ SOPN: 40.0000 [IU] | PEN_INJECTOR | Freq: Every day | SUBCUTANEOUS | 0 refills | Status: DC

## 2019-12-22 NOTE — Telephone Encounter (Signed)
Insurance preferred brand basaglar, tresiba or levemir.  Please advise.

## 2019-12-22 NOTE — Telephone Encounter (Signed)
Refill sent.

## 2019-12-22 NOTE — Telephone Encounter (Signed)
basaglar is on med list.  Please refill prn

## 2019-12-28 ENCOUNTER — Other Ambulatory Visit: Payer: Self-pay

## 2019-12-28 ENCOUNTER — Ambulatory Visit (INDEPENDENT_AMBULATORY_CARE_PROVIDER_SITE_OTHER): Payer: 59 | Admitting: Obstetrics and Gynecology

## 2019-12-28 ENCOUNTER — Encounter: Payer: Self-pay | Admitting: Obstetrics and Gynecology

## 2019-12-28 VITALS — BP 134/78 | Wt 255.0 lb

## 2019-12-28 DIAGNOSIS — N8110 Cystocele, unspecified: Secondary | ICD-10-CM

## 2019-12-28 DIAGNOSIS — N812 Incomplete uterovaginal prolapse: Secondary | ICD-10-CM

## 2019-12-28 DIAGNOSIS — K5900 Constipation, unspecified: Secondary | ICD-10-CM | POA: Diagnosis not present

## 2019-12-28 DIAGNOSIS — N3281 Overactive bladder: Secondary | ICD-10-CM

## 2019-12-28 DIAGNOSIS — R3915 Urgency of urination: Secondary | ICD-10-CM

## 2019-12-28 DIAGNOSIS — N393 Stress incontinence (female) (male): Secondary | ICD-10-CM

## 2019-12-28 DIAGNOSIS — N816 Rectocele: Secondary | ICD-10-CM

## 2019-12-28 LAB — POCT URINALYSIS DIPSTICK
Appearance: NORMAL
Bilirubin, UA: NEGATIVE
Blood, UA: NEGATIVE
Glucose, UA: POSITIVE — AB
Ketones, UA: NEGATIVE
Leukocytes, UA: NEGATIVE
Nitrite, UA: NEGATIVE
Protein, UA: POSITIVE — AB
Spec Grav, UA: 1.025 (ref 1.010–1.025)
Urobilinogen, UA: 0.2 E.U./dL
pH, UA: 5 (ref 5.0–8.0)

## 2019-12-28 NOTE — Patient Instructions (Signed)

## 2019-12-28 NOTE — Progress Notes (Signed)
[facility] New Patient Evaluation and Consultation  Referring Provider: Tamela Gammon, NP PCP: [name], NP-C Date of Service: 12/28/2019  SUBJECTIVE Chief Complaint: New Patient (Initial Visit) Victoria Padilla referral )  History of Present Illness: Victoria Padilla is a 54 y.o. Black or African-American female seen in consultation at the request of NP Juleen China for evaluation of mixed incontinence.    Review of records significant for: Urinary leakage has worsened over the last few years. Occurs at all times.   Has DM and last A1c (12/16/19) was 9.7.  Urinary Symptoms: Leaks urine with cough/ sneeze, laughing, exercise, lifting, going from sitting to standing, with a full bladder and with movement to the bathroom. Mostly occurs with just sitting.  Has also noticed that her pad is wet at night.  Leaks >5 time(s) per days.  Pad use: 5 pads per day.   She is bothered by her UI symptoms.  Day time voids 4-5.  Nocturia: 1-2 times per night to void. Voiding dysfunction: she sometimes does not empty her bladder well.  does not use a catheter to empty bladder.  When urinating, she feels dribbling after finishing Drinks: 36oz coffee, 64oz water per day  UTIs: 1 UTI's in the last year.   Denies history of blood in urine and kidney or bladder stones  Pelvic Organ Prolapse Symptoms:                  She Denies a feeling of a bulge the vaginal area.   Bowel Symptom: Bowel movements: 1-2 time(s) per week Stool consistency: soft  Straining: yes.  Splinting: no.  Incomplete evacuation: no.  She Denies accidental bowel leakage / fecal incontinence Bowel regimen: stool softener, and just started miralax Last colonoscopy: Date 2018, Results neg  Sexual Function Sexually active: no.    Pelvic Pain Admits to pelvic pain Location: left side   Past Medical History:  Past Medical History:  Diagnosis Date  . Acid reflux   . Anemia   . Arthritis   .  Asthma   . Cervical dysplasia   . Diabetes mellitus   . DVT (deep venous thrombosis) ([place])   . Gallbladder problem   . GERD (gastroesophageal reflux disease)   . Heart murmur   . Hx of blood clots   . Hyperlipidemia   . Hypertension   . LBP (low back pain)   . Liver problem   . Obesity   . OSA (obstructive sleep apnea) 03/22/2018   New diagnosis. Mild.   . Pulmonary embolism ([place])   . Sarcoidosis   . Sleep apnea   . Vitamin D deficiency      Past Surgical History:   Past Surgical History:  Procedure Laterality Date  . BREAST BIOPSY Left 2019  . BUNIONECTOMY    . CHOLECYSTECTOMY    . COLPOSCOPY    . GYNECOLOGIC CRYOSURGERY    . KNEE ARTHROSCOPY    . TUBAL LIGATION       Past OB/GYN History: G3 P3 Vaginal deliveries: 3,  Forceps/ Vacuum deliveries: 0, Cesarean section: 0 Menopausal: No Contraception: n/a. Last pap smear was 2019.  Any history of abnormal pap smears: no. Had an EMB 12/16/19 which was benign.    Medications: She has a current medication list which includes the following prescription(s): amlodipine, b-d ultrafine iii short pen, benazepril, cetirizine, cholecalciferol, clobetasol ointment, freestyle libre 2 sensor, dapagliflozin propanediol, diclofenac sodium, trulicity, onetouch verio, hydrochlorothiazide, basaglar kwikpen, metformin, metoprolol tartrate, omega-3 fatty acids,  omeprazole, ondansetron, rivaroxaban, and simvastatin.   Allergies: Patient is allergic to naprosyn [naproxen].   Social History:  Social History   Tobacco Use  . Smoking status: Never Smoker  . Smokeless tobacco: Never Used  Vaping Use  . Vaping Use: Never used  Substance Use Topics  . Alcohol use: No  . Drug use: No    Relationship status: single She lives with son.   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 Regular exercise: Yes: 3x week History of abuse: No  Family History:   Family History  Problem Relation Age of Onset  . Heart failure Mother 61       chf  .  Diabetes Mother   . High blood pressure Mother   . Heart disease Mother   . Sudden death Mother   . Kidney disease Mother   . Obesity Mother   . Heart failure Father   . Heart disease Father   . Sudden death Father   . Alcoholism Father   . Cancer Brother 40       prostate  . Hypertension Maternal Uncle   . Hyperlipidemia Brother   . Diabetes Brother   . Colon cancer Maternal Grandfather   . Colon polyps Brother   . Diabetes Brother   . Hyperlipidemia Brother   . Stomach cancer Neg Hx   . Esophageal cancer Neg Hx   . Rectal cancer Neg Hx   . Liver cancer Neg Hx   . Breast cancer Neg Hx      Review of Systems: Review of Systems  Constitutional: Negative for fever, malaise/fatigue and weight loss.  Respiratory: Negative for cough, shortness of breath and wheezing.   Cardiovascular: Positive for leg swelling. Negative for chest pain and palpitations.  Gastrointestinal: Negative for abdominal pain and blood in stool.  Genitourinary:       Abnormal periods, hot flashes  Musculoskeletal: Negative for myalgias.  Skin: Positive for rash.  Neurological: Negative for dizziness and headaches.  Endo/Heme/Allergies: Does not bruise/bleed easily.  Psychiatric/Behavioral: Negative for depression. The patient is not nervous/anxious.      OBJECTIVE Physical Exam: Vitals:   12/28/19 1507  BP: 134/78  Weight: 255 lb (115.7 kg)    Physical Exam Constitutional:      General: She is not in acute distress. Pulmonary:     Effort: Pulmonary effort is normal.  Abdominal:     General: There is no distension.     Palpations: Abdomen is soft.     Tenderness: There is no abdominal tenderness. There is no rebound.  Musculoskeletal:        General: No swelling. Normal range of motion.  Skin:    General: Skin is warm and dry.     Findings: No rash.  Neurological:     Mental Status: She is alert and oriented to person, place, and time.  Psychiatric:        Mood and Affect: Mood  normal.        Behavior: Behavior normal.     GU / Detailed Urogynecologic Evaluation:  Pelvic Exam: Normal external female genitalia; Bartholin's and Skene's glands normal in appearance; urethral meatus normal in appearance, no urethral masses or discharge.   CST: negative, Valsalva stress test: positive  Speculum exam reveals normal vaginal mucosa without atrophy. Cervix normal appearance. Uterus normal single, nontender. Adnexa no mass, fullness, tenderness.     Pelvic floor strength II/V  Pelvic floor musculature: Right levator non-tender, Right obturator non-tender, Left levator non-tender, Left obturator  non-tender  POP-Q:   POP-Q  -1                                            Aa   -1                                           Ba  -5                                              C   4                                            Gh  5                                            Pb  8                                            tvl   -1                                            Ap  -1                                            Bp  -7                                              D     Rectal Exam:  Normal external rectum  Post-Void Residual (PVR) by Bladder Scan: In order to evaluate bladder emptying, we discussed obtaining a postvoid residual and she agreed to this procedure.  Procedure: The ultrasound unit was placed on the patient's abdomen in the suprapubic region after the patient had voided. A PVR of 35 ml was obtained by bladder scan.  Laboratory Results: POC urine: 2+ glucose, 1+ protein  I visualized the urine specimen, noting the specimen to be dark yellow  ASSESSMENT AND PLAN Victoria Padilla is a 54 y.o. with:  1. SUI (stress urinary incontinence, female)   2. Overactive bladder   3. Urinary urgency   4. Prolapse of anterior vaginal wall   5. Prolapse of posterior vaginal wall   6. Uterovaginal prolapse, incomplete   7. Constipation,  unspecified constipation type    1. SUI - demonstrated on exam today - For treatment of stress urinary incontinence, which is leakage with physical activity/movement/strainging/coughing, we discussed expectant management versus nonsurgical  options versus surgery. Nonsurgical options include weight loss, physical therapy, as well as a pessary.  Surgical options include a midurethral sling, which is a synthetic mesh sling that acts like a hammock under the urethra to prevent leakage of urine, a Burch urethropexy, and transurethral injection of a bulking agent. - She is unsure about what she wants to do and will consider her options. She is not an ideal surgical candidate due to her uncontrolled DM.   2. OAB/ urgency - We discussed the symptoms of overactive bladder (OAB), which include urinary urgency, urinary frequency, nocturia, with or without urge incontinence.  While we do not know the exact etiology of OAB, several treatment options exist. We discussed management including behavioral therapy (decreasing bladder irritants, urge suppression strategies, timed voids, bladder retraining), physical therapy, medication; for refractory cases posterior tibial nerve stimulation, sacral neuromodulation, and intravesical botulinum toxin injection.  - She will work on decreasing her caffeine/ coffee intake to see if this improves her symptoms  3. Stage II anterior, Stage II posterior, stage I apical prolapse - she is currently asymptomatic, deferred treatment at this time.   4. Constipation - reviewed adding in miralax with stool softener and avoiding straining.    Follow up in 6 weeks   [name], MD   Medical Decision Making:  - Reviewed/ ordered a clinical laboratory tes - Review and summation of prior records - Independent review of urine specimen

## 2020-01-13 ENCOUNTER — Encounter (INDEPENDENT_AMBULATORY_CARE_PROVIDER_SITE_OTHER): Payer: 59 | Admitting: Ophthalmology

## 2020-01-13 ENCOUNTER — Other Ambulatory Visit: Payer: Self-pay

## 2020-01-13 DIAGNOSIS — H43813 Vitreous degeneration, bilateral: Secondary | ICD-10-CM

## 2020-01-13 DIAGNOSIS — H35033 Hypertensive retinopathy, bilateral: Secondary | ICD-10-CM | POA: Diagnosis not present

## 2020-01-13 DIAGNOSIS — E11311 Type 2 diabetes mellitus with unspecified diabetic retinopathy with macular edema: Secondary | ICD-10-CM

## 2020-01-13 DIAGNOSIS — E113513 Type 2 diabetes mellitus with proliferative diabetic retinopathy with macular edema, bilateral: Secondary | ICD-10-CM | POA: Diagnosis not present

## 2020-01-13 DIAGNOSIS — I10 Essential (primary) hypertension: Secondary | ICD-10-CM | POA: Diagnosis not present

## 2020-02-01 ENCOUNTER — Ambulatory Visit (INDEPENDENT_AMBULATORY_CARE_PROVIDER_SITE_OTHER): Payer: 59 | Admitting: Endocrinology

## 2020-02-01 ENCOUNTER — Other Ambulatory Visit: Payer: Self-pay

## 2020-02-01 ENCOUNTER — Encounter: Payer: Self-pay | Admitting: Endocrinology

## 2020-02-01 VITALS — BP 128/84 | HR 79 | Ht 63.0 in | Wt 256.2 lb

## 2020-02-01 DIAGNOSIS — E118 Type 2 diabetes mellitus with unspecified complications: Secondary | ICD-10-CM | POA: Diagnosis not present

## 2020-02-01 DIAGNOSIS — E1165 Type 2 diabetes mellitus with hyperglycemia: Secondary | ICD-10-CM | POA: Diagnosis not present

## 2020-02-01 DIAGNOSIS — IMO0002 Reserved for concepts with insufficient information to code with codable children: Secondary | ICD-10-CM

## 2020-02-01 LAB — POCT GLYCOSYLATED HEMOGLOBIN (HGB A1C): Hemoglobin A1C: 9 % — AB (ref 4.0–5.6)

## 2020-02-01 MED ORDER — BASAGLAR KWIKPEN 100 UNIT/ML ~~LOC~~ SOPN
50.0000 [IU] | PEN_INJECTOR | SUBCUTANEOUS | 3 refills | Status: DC
Start: 2020-02-01 — End: 2020-07-18

## 2020-02-01 NOTE — Patient Instructions (Addendum)
check your blood sugar 4 times a day: before the 3 meals, and at bedtime.  also check if you have symptoms of your blood sugar being too high or too low.  please keep a record of the readings and bring it to your next appointment here (or you can bring the meter itself).  You can write it on any piece of paper.  please call us sooner if your blood sugar goes below 70, or if you have a lot of readings over 200. Please increase the Basaglar to 50 units each morning, and: Please continue the same other diabetes medications.  Please come back for a follow-up appointment in 2 months.

## 2020-02-01 NOTE — Progress Notes (Signed)
Subjective:    Patient ID: Victoria Padilla, female    DOB: 22-Apr-1965, 54 y.o.   MRN: [id number]  HPI Pt returns for f/u of diabetes mellitus: DM type: Insulin-requiring type 2 Dx'ed: 2703 Complications: DR Therapy: insulin since 5009, Trulicity, and 2 oral meds.   GDM: 1998, but DM persisted after the pregnancy.   DKA: never Severe hypoglycemia: never Pancreatitis: never Pancreatic imaging: normal on 2017 [place].  Other: she declined multiple daily injections. chronic abd pain and nausea have limited rx options.  she also has thyroid pseudonodule.   Interval history: pt says cbg varies from 100-180.  She checks fasting only.  pt states she feels well in general.  Pt says she never misses meds. She says Trulicity causes nausea, but she can live with it.  She declines to use continuous glucose monitor.   Past Medical History:  Diagnosis Date  . Acid reflux   . Anemia   . Arthritis   . Asthma   . Cervical dysplasia   . Diabetes mellitus   . DVT (deep venous thrombosis) ([place])   . Gallbladder problem   . GERD (gastroesophageal reflux disease)   . Heart murmur   . Hx of blood clots   . Hyperlipidemia   . Hypertension   . LBP (low back pain)   . Liver problem   . Obesity   . OSA (obstructive sleep apnea) 03/22/2018   New diagnosis. Mild.   . Pulmonary embolism ([place])   . Sarcoidosis   . Sleep apnea   . Vitamin D deficiency     Past Surgical History:  Procedure Laterality Date  . BREAST BIOPSY Left 2019  . BUNIONECTOMY    . CHOLECYSTECTOMY    . COLPOSCOPY    . GYNECOLOGIC CRYOSURGERY    . KNEE ARTHROSCOPY    . TUBAL LIGATION      Social History   Socioeconomic History  . Marital status: Single    Spouse name: Not on file  . Number of children: 3  . Years of education: Not on file  . Highest education level: Not on file  Occupational History  . Occupation: Careers adviser: [employer]  Tobacco Use  . Smoking status: Never Smoker  . Smokeless  tobacco: Never Used  Vaping Use  . Vaping Use: Never used  Substance and Sexual Activity  . Alcohol use: No  . Drug use: No  . Sexual activity: Not Currently    Birth control/protection: Surgical  Other Topics Concern  . Not on file  Social History Narrative  . Not on file   Social Determinants of Health   Financial Resource Strain: Not on file  Food Insecurity: Not on file  Transportation Needs: Not on file  Physical Activity: Not on file  Stress: Not on file  Social Connections: Not on file  Intimate Partner Violence: Not on file    Current Outpatient Medications on File Prior to Visit  Medication Sig Dispense Refill  . amLODipine (NORVASC) 5 MG tablet Take 1 tablet (5 mg total) by mouth daily. 90 tablet 1  . B-D ULTRAFINE III SHORT PEN 31G X 8 MM MISC USE TO INJECT BASAGLAR DAILY 100 each 11  . benazepril (LOTENSIN) 40 MG tablet TAKE 1 TABLET BY MOUTH EVERY DAY 90 tablet 0  . cetirizine (ZYRTEC) 10 MG tablet Take 10 mg by mouth daily.    . cholecalciferol (VITAMIN D) 1000 units tablet Take 2,000 Units by mouth at bedtime.     Marland Kitchen  clobetasol ointment (TEMOVATE) 9.51 % Apply 1 application topically 2 (two) times daily. Twice a day x 4 weeks, then once a day x 4 weeks, then twice a week. 30 g 2  . Continuous Blood Gluc Sensor (FREESTYLE LIBRE 2 SENSOR) MISC 1 Device by Does not apply route every 14 (fourteen) days. 6 each 3  . dapagliflozin propanediol (FARXIGA) 10 MG TABS tablet Take 1 tablet (10 mg total) by mouth daily before breakfast. 90 tablet 3  . diclofenac Sodium (VOLTAREN) 1 % GEL Apply 2 g topically 4 (four) times daily. 150 g 2  . Dulaglutide (TRULICITY) 8.84 ZY/6.0YT SOPN Inject 0.75 mg into the skin once a week. 6 mL 3  . glucose blood (ONETOUCH VERIO) test strip Use as instructed 100 each 0  . hydrochlorothiazide (HYDRODIURIL) 25 MG tablet TAKE 1 TABLET BY MOUTH EVERY DAY 90 tablet 3  . metFORMIN (GLUCOPHAGE-XR) 500 MG 24 hr tablet TAKE 1 TABLET BY MOUTH TWICE A DAY  180 tablet 3  . metoprolol tartrate (LOPRESSOR) 25 MG tablet Take 1 tablet (25 mg total) by mouth 2 (two) times daily. 180 tablet 1  . Omega-3 Fatty Acids (FISH OIL PO) Take 1 capsule by mouth at bedtime. Reported on 08/08/2015    . omeprazole (PRILOSEC) 40 MG capsule TAKE 1 CAPSULE BY MOUTH EVERY DAY 90 capsule 1  . ondansetron (ZOFRAN) 4 MG tablet Take 4 mg by mouth every 8 (eight) hours as needed for nausea or vomiting.    . rivaroxaban (XARELTO) 10 MG TABS tablet Take 1 tablet (10 mg total) by mouth daily. 90 tablet 1  . simvastatin (ZOCOR) 20 MG tablet TAKE 1 TABLET BY MOUTH EVERYDAY AT BEDTIME 90 tablet 1   No current facility-administered medications on file prior to visit.    Allergies  Allergen Reactions  . Naprosyn [Naproxen] Nausea Only    Family History  Problem Relation Age of Onset  . Heart failure Mother 67       chf  . Diabetes Mother   . High blood pressure Mother   . Heart disease Mother   . Sudden death Mother   . Kidney disease Mother   . Obesity Mother   . Heart failure Father   . Heart disease Father   . Sudden death Father   . Alcoholism Father   . Cancer Brother 62       prostate  . Hypertension Maternal Uncle   . Hyperlipidemia Brother   . Diabetes Brother   . Colon cancer Maternal Grandfather   . Colon polyps Brother   . Diabetes Brother   . Hyperlipidemia Brother   . Stomach cancer Neg Hx   . Esophageal cancer Neg Hx   . Rectal cancer Neg Hx   . Liver cancer Neg Hx   . Breast cancer Neg Hx     BP 128/84   Pulse 79   Ht 5\' 3"  (1.6 m)   Wt 256 lb 3.2 oz (116.2 kg)   SpO2 93%   BMI 45.38 kg/m    Review of Systems she denies hypoglycemia    Objective:   Physical Exam VITAL SIGNS:  See vs page GENERAL: no distress Pulses: dorsalis pedis intact bilat.   MSK: no deformity of the feet CV: 1+ bilat leg edema Skin:  no ulcer on the feet.  normal color and temp on the feet. Neuro: sensation is intact to touch on the feet  A1c=9.0%     Assessment & Plan:  Insulin-requiring type 2  DM: uncontrolled Nausea, due to trulicity.  She decides to continue  Patient Instructions  check your blood sugar 4 times a day: before the 3 meals, and at bedtime.  also check if you have symptoms of your blood sugar being too high or too low.  please keep a record of the readings and bring it to your next appointment here (or you can bring the meter itself).  You can write it on any piece of paper.  please call us sooner if your blood sugar goes below 70, or if you have a lot of readings over 200. Please increase the Basaglar to 50 units each morning, and: Please continue the same other diabetes medications.  Please come back for a follow-up appointment in 2 months.

## 2020-02-04 NOTE — Progress Notes (Signed)
[facility] Return Visit  SUBJECTIVE  History of Present Illness: Victoria Padilla is a 54 y.o. female seen in follow-up for incontinence and constipation. Plan at last visit was to decrease bladder irritants and to add miralax to her bowel regimen.   She is down to one cup of coffee but has not seen any improvement yet with her leakage. She just started being consistent with this.   She has been working on her blood sugar control- recent POCT A1c on 02/01/20 was 9.0  She is interested in trying a pessary today.    Past Medical History: Patient  has a past medical history of Acid reflux, Anemia, Arthritis, Asthma, Cervical dysplasia, Diabetes mellitus, DVT (deep venous thrombosis) ([place]), Gallbladder problem, GERD (gastroesophageal reflux disease), Heart murmur, blood clots, Hyperlipidemia, Hypertension, LBP (low back pain), Liver problem, Obesity, OSA (obstructive sleep apnea) (03/22/2018), Pulmonary embolism ([place]), Sarcoidosis, Sleep apnea, and Vitamin D deficiency.   Past Surgical History: She  has a past surgical history that includes Cholecystectomy; Bunionectomy; Knee arthroscopy; Colposcopy; Gynecologic cryosurgery; Tubal ligation; and Breast biopsy (Left, 2019).   Medications: She has a current medication list which includes the following prescription(s): amlodipine, b-d ultrafine iii short pen, benazepril, besivance, cetirizine, cholecalciferol, clobetasol ointment, freestyle libre 2 sensor, dapagliflozin propanediol, diclofenac sodium, trulicity, onetouch verio, hydrochlorothiazide, basaglar kwikpen, metformin, metoprolol tartrate, omega-3 fatty acids, omeprazole, ondansetron, rivaroxaban, and simvastatin.   Allergies: Patient is allergic to naprosyn [naproxen].   Social History: Patient  reports that she has never smoked. She has never used smokeless tobacco. She reports that she does not drink alcohol and does not use drugs.      OBJECTIVE     Physical  Exam: Vitals:   02/08/20 1341  BP: 125/74  Pulse: 79  Weight: 255 lb (115.7 kg)  Height: 5\' 3"  (1.6 m)   Gen: No apparent distress, A&O x 3.  Pessary fitting:  A 2" ring with knob pessary was placed into the vagina. It did not fit well and was bulging out of the vagina. A 1 3/4" ring with knob was then placed.  It was comfortable, fit well, and stayed in placed with strong cough, valsalva and bending. The patient demonstrated proper placement and removal. Lot # [id number] Exp 11/10/23  Prior exam showed:  POP-Q  -1                                            Aa   -1                                           Ba  -5                                              C   4                                            Gh  5  Pb  8                                            tvl   -1                                            Ap  -1                                            Bp  -7                                              D        ASSESSMENT AND PLAN    Victoria Padilla is a 54 y.o. with:  1. Overactive bladder   2. SUI (stress urinary incontinence, female)     1. OAB - She will continue to decrease bladder irritants and work on her blood glucose control to see if she has improvement.  - She will consider starting a medication for her urgency symptoms if she does not see improvement.   2. SUI - 1 3/4" ring with knob pessary placed today - She will assess to see if she has symptom improvement with this. She is potentially interested in a sling if her blood glucose improves.   Return 1 month.   [name], MD   Total time: 45 min was spent on this encounter.

## 2020-02-08 ENCOUNTER — Ambulatory Visit (INDEPENDENT_AMBULATORY_CARE_PROVIDER_SITE_OTHER): Payer: 59 | Admitting: Obstetrics and Gynecology

## 2020-02-08 ENCOUNTER — Other Ambulatory Visit: Payer: Self-pay

## 2020-02-08 ENCOUNTER — Encounter: Payer: Self-pay | Admitting: Obstetrics and Gynecology

## 2020-02-08 VITALS — BP 125/74 | HR 79 | Ht 63.0 in | Wt 255.0 lb

## 2020-02-08 DIAGNOSIS — N393 Stress incontinence (female) (male): Secondary | ICD-10-CM

## 2020-02-08 DIAGNOSIS — N3281 Overactive bladder: Secondary | ICD-10-CM

## 2020-02-08 NOTE — Patient Instructions (Signed)
You were fitted with a ring with knob pessary.  Come back in about a month to see how it is working.  You can take it out every week, or sooner if you desire. Clean with liquid soap and water in between uses and leave out over night on the night that you remove it. Call for any problems.

## 2020-02-10 ENCOUNTER — Encounter (INDEPENDENT_AMBULATORY_CARE_PROVIDER_SITE_OTHER): Payer: 59 | Admitting: Ophthalmology

## 2020-02-10 ENCOUNTER — Other Ambulatory Visit: Payer: Self-pay

## 2020-02-10 DIAGNOSIS — H35033 Hypertensive retinopathy, bilateral: Secondary | ICD-10-CM | POA: Diagnosis not present

## 2020-02-10 DIAGNOSIS — E11311 Type 2 diabetes mellitus with unspecified diabetic retinopathy with macular edema: Secondary | ICD-10-CM

## 2020-02-10 DIAGNOSIS — E113513 Type 2 diabetes mellitus with proliferative diabetic retinopathy with macular edema, bilateral: Secondary | ICD-10-CM

## 2020-02-10 DIAGNOSIS — I10 Essential (primary) hypertension: Secondary | ICD-10-CM | POA: Diagnosis not present

## 2020-02-10 DIAGNOSIS — H43813 Vitreous degeneration, bilateral: Secondary | ICD-10-CM

## 2020-02-22 ENCOUNTER — Other Ambulatory Visit: Payer: Self-pay | Admitting: Family Medicine

## 2020-03-09 ENCOUNTER — Encounter (INDEPENDENT_AMBULATORY_CARE_PROVIDER_SITE_OTHER): Payer: 59 | Admitting: Ophthalmology

## 2020-03-09 NOTE — Progress Notes (Deleted)
[facility]   Subjective:     Chief Complaint: No chief complaint on file.  History of Present Illness: Victoria Padilla is a 55 y.o. female with stress incontinence and OAB who presents for a pessary check. She is using a size 1 3/4" ring with knob pessary. The pessary has been working well and she has no complaints. She is not using vaginal estrogen. Denies vaginal bleeding.   For her overactive bladder, ***  Past Medical History: Patient  has a past medical history of Acid reflux, Anemia, Arthritis, Asthma, Cervical dysplasia, Diabetes mellitus, DVT (deep venous thrombosis) ([place]), Gallbladder problem, GERD (gastroesophageal reflux disease), Heart murmur, blood clots, Hyperlipidemia, Hypertension, LBP (low back pain), Liver problem, Obesity, OSA (obstructive sleep apnea) (03/22/2018), Pulmonary embolism ([place]), Sarcoidosis, Sleep apnea, and Vitamin D deficiency.   Past Surgical History: She  has a past surgical history that includes Cholecystectomy; Bunionectomy; Knee arthroscopy; Colposcopy; Gynecologic cryosurgery; Tubal ligation; and Breast biopsy (Left, 2019).   Medications: She has a current medication list which includes the following prescription(s): amlodipine, b-d ultrafine iii short pen, benazepril, besivance, cetirizine, cholecalciferol, clobetasol ointment, freestyle libre 2 sensor, dapagliflozin propanediol, diclofenac sodium, trulicity, onetouch verio, hydrochlorothiazide, basaglar kwikpen, metformin, metoprolol tartrate, omega-3 fatty acids, omeprazole, ondansetron, simvastatin, and xarelto.   Allergies: Patient is allergic to naprosyn [naproxen].   Social History: Patient  reports that she has never smoked. She has never used smokeless tobacco. She reports that she does not drink alcohol and does not use drugs.      Objective:    Physical Exam: There were no vitals taken for this visit. Gen: No apparent distress, A&O x 3. Detailed Urogynecologic  Evaluation:  Pelvic Exam: Normal external female genitalia; Bartholin's and Skene's glands normal in appearance; urethral meatus {urethra:24773}, no urethral masses or discharge. The pessary was noted to be {in place:24774}. It was removed and cleaned. Speculum exam revealed {vaginal lesions:24775} in the vagina. The pessary was replaced. It was comfortable to the patient and fit well.   No flowsheet data found.  Laboratory Results: Urine dipstick shows: {ua dip:315374::"negative for all components"}.    Assessment/Plan:    Assessment: Victoria Padilla is a 55 y.o. with {PFD symptoms:24771} here for a pessary check. She is doing well.  Plan: She will {pessary plan:24776}. She will continue to use {lubricant:24777}. She will follow-up in *** {days/wks/mos/yrs:310907} for a pessary check or sooner as needed.  All questions were answered.   Time Spent:

## 2020-03-13 ENCOUNTER — Other Ambulatory Visit: Payer: Self-pay | Admitting: Endocrinology

## 2020-03-14 ENCOUNTER — Ambulatory Visit: Payer: 59 | Admitting: Obstetrics and Gynecology

## 2020-04-04 ENCOUNTER — Other Ambulatory Visit: Payer: Self-pay

## 2020-04-04 ENCOUNTER — Ambulatory Visit: Payer: 59 | Admitting: Endocrinology

## 2020-04-04 ENCOUNTER — Ambulatory Visit (HOSPITAL_COMMUNITY)
Admission: EM | Admit: 2020-04-04 | Discharge: 2020-04-04 | Disposition: A | Discharge status: Home / Self Care | Payer: 59 | Attending: Student | Admitting: Student

## 2020-04-04 ENCOUNTER — Encounter (HOSPITAL_COMMUNITY): Payer: Self-pay | Admitting: Emergency Medicine

## 2020-04-04 DIAGNOSIS — Z791 Long term (current) use of non-steroidal anti-inflammatories (NSAID): Secondary | ICD-10-CM | POA: Insufficient documentation

## 2020-04-04 DIAGNOSIS — K219 Gastro-esophageal reflux disease without esophagitis: Secondary | ICD-10-CM | POA: Diagnosis not present

## 2020-04-04 DIAGNOSIS — R519 Headache, unspecified: Secondary | ICD-10-CM | POA: Diagnosis not present

## 2020-04-04 DIAGNOSIS — Z833 Family history of diabetes mellitus: Secondary | ICD-10-CM | POA: Insufficient documentation

## 2020-04-04 DIAGNOSIS — R42 Dizziness and giddiness: Secondary | ICD-10-CM | POA: Diagnosis present

## 2020-04-04 DIAGNOSIS — Z79899 Other long term (current) drug therapy: Secondary | ICD-10-CM | POA: Diagnosis not present

## 2020-04-04 DIAGNOSIS — E119 Type 2 diabetes mellitus without complications: Secondary | ICD-10-CM

## 2020-04-04 DIAGNOSIS — Z20822 Contact with and (suspected) exposure to covid-19: Secondary | ICD-10-CM | POA: Diagnosis not present

## 2020-04-04 DIAGNOSIS — Z794 Long term (current) use of insulin: Secondary | ICD-10-CM | POA: Diagnosis not present

## 2020-04-04 DIAGNOSIS — M1990 Unspecified osteoarthritis, unspecified site: Secondary | ICD-10-CM | POA: Insufficient documentation

## 2020-04-04 DIAGNOSIS — R600 Localized edema: Secondary | ICD-10-CM

## 2020-04-04 DIAGNOSIS — Z1152 Encounter for screening for COVID-19: Secondary | ICD-10-CM

## 2020-04-04 DIAGNOSIS — R110 Nausea: Secondary | ICD-10-CM

## 2020-04-04 DIAGNOSIS — I10 Essential (primary) hypertension: Secondary | ICD-10-CM | POA: Diagnosis not present

## 2020-04-04 DIAGNOSIS — Z7901 Long term (current) use of anticoagulants: Secondary | ICD-10-CM | POA: Insufficient documentation

## 2020-04-04 DIAGNOSIS — D869 Sarcoidosis, unspecified: Secondary | ICD-10-CM | POA: Diagnosis not present

## 2020-04-04 NOTE — Discharge Instructions (Addendum)
-  Please elevate your feet to help reduce swelling. -Make sure you drink plenty of fluids.  -Call your primary care provider tomorrow and schedule a follow-up appointment for tomorrow. This must occur tomorrow.  -If you develop left-sided chest pain, pain down left arm, worsening of dizziness, loss of consciousness, worsening of headache, worst headache of life- etc- call 911 IMMEDIATELY.

## 2020-04-04 NOTE — ED Triage Notes (Signed)
Onset yesterday of dizziness, nausea and headache.  Pain is involving the back of her head.    Denies vomiting, denies diarrhea.

## 2020-04-04 NOTE — ED Provider Notes (Addendum)
[place]    CSN: [id number] Arrival date & time: 04/04/20  1855      History   Chief Complaint Chief Complaint  Patient presents with  . Dizziness  . Nausea    HPI Victoria Padilla is a 55 y.o. female presenting for dizziness, nausea, and headaches x2 days. History asthma, arthritis, diabetes, GERD, heart murmur, hypertension, hpyerlipidemia, obesity, DVT, PE, sarcoidosis, "liver problem". Today endorses constant lightheadedness, 5/10 headache, nausea without vomiting. States she symptoms started abruptly. Endorses new onset of pedal edema. Denies calf swelling, recent immobilization, shortness of breath, chest pain, pain down left arm. Denies URI sx. Denies worst headache of life, thunderclap headache, weakness/sensation changes in arms/legs, vision changes, shortness of breath, chest pain/pressure, photophobia, phonophobia, v/d. Denies sick contacts. Denies history of cardoipulmonary disease .  HPI  Past Medical History:  Diagnosis Date  . Acid reflux   . Anemia   . Arthritis   . Asthma   . Cervical dysplasia   . Diabetes mellitus   . DVT (deep venous thrombosis) ([place])   . Gallbladder problem   . GERD (gastroesophageal reflux disease)   . Heart murmur   . Hx of blood clots   . Hyperlipidemia   . Hypertension   . LBP (low back pain)   . Liver problem   . Obesity   . OSA (obstructive sleep apnea) 03/22/2018   New diagnosis. Mild.   . Pulmonary embolism ([place])   . Sarcoidosis   . Sleep apnea   . Vitamin D deficiency     Patient Active Problem List   Diagnosis Date Noted  . Pulmonary embolism ([place]) 08/15/2018  . Weight gain 08/03/2018  . OSA (obstructive sleep apnea) 03/22/2018  . Orthopnea 02/13/2018  . Family history of heart disease in female family member before age 73 02/13/2018  . GERD (gastroesophageal reflux disease) 12/21/2015  . Personal history of noncompliance with medical treatment, presenting hazards to health 12/21/2015  . Morbid  obesity ([place]) 03/03/2015  . History of anemia 03/03/2015  . Hyperlipidemia associated with type 2 diabetes mellitus ([place]) 03/03/2015  . Benign essential HTN 03/03/2015  . Uncontrolled type 2 diabetes with eye complications ([place]) 74/01/8785  . Uncontrolled type 2 diabetes mellitus with complication ([place])   . Arthritis   . Cervical dysplasia   . Sarcoidosis     Past Surgical History:  Procedure Laterality Date  . BREAST BIOPSY Left 2019  . BUNIONECTOMY    . CHOLECYSTECTOMY    . COLPOSCOPY    . GYNECOLOGIC CRYOSURGERY    . KNEE ARTHROSCOPY    . TUBAL LIGATION      OB History    Gravida  3   Para  3   Term  3   Preterm      AB      Living  3     SAB      IAB      Ectopic      Multiple      Live Births               Home Medications    Prior to Admission medications   Medication Sig Start Date End Date Taking? Authorizing Provider  amLODipine (NORVASC) 5 MG tablet Take 1 tablet (5 mg total) by mouth daily. 08/19/19  Yes Henson, [name] L, NP-C  benazepril (LOTENSIN) 40 MG tablet TAKE 1 TABLET BY MOUTH EVERY DAY 12/06/19  Yes Henson, [name] L, NP-C  cholecalciferol (VITAMIN D) 1000 units tablet  Take 2,000 Units by mouth at bedtime.    Yes [provider]  dapagliflozin propanediol (FARXIGA) 10 MG TABS tablet Take 1 tablet (10 mg total) by mouth daily before breakfast. 12/16/19  Yes Renato Shin, MD  Dulaglutide (TRULICITY) 9.38 HW/2.9HB SOPN Inject 0.75 mg into the skin once a week. 12/16/19  Yes Renato Shin, MD  hydrochlorothiazide (HYDRODIURIL) 25 MG tablet TAKE 1 TABLET BY MOUTH EVERY DAY 12/07/19  Yes Nahser, Wonda Cheng, MD  metFORMIN (GLUCOPHAGE-XR) 500 MG 24 hr tablet TAKE 1 TABLET BY MOUTH TWICE A DAY 03/13/20  Yes Renato Shin, MD  metoprolol tartrate (LOPRESSOR) 25 MG tablet TAKE 1 TABLET BY MOUTH TWICE A DAY 02/22/20  Yes Henson, [name] L, NP-C  Omega-3 Fatty Acids (FISH OIL PO) Take 1 capsule by mouth at bedtime. Reported on 08/08/2015   Yes  [provider]  omeprazole (PRILOSEC) 40 MG capsule TAKE 1 CAPSULE BY MOUTH EVERY DAY 12/14/19  Yes Henson, [name] L, NP-C  XARELTO 10 MG TABS tablet TAKE 1 TABLET BY MOUTH EVERY DAY 02/22/20  Yes Henson, [name] L, NP-C  B-D ULTRAFINE III SHORT PEN 31G X 8 MM MISC USE TO INJECT BASAGLAR DAILY 08/24/18   Renato Shin, MD  BESIVANCE 0.6 % SUSP SMARTSIG:1 In Eye(s) 11/28/19   [provider]  cetirizine (ZYRTEC) 10 MG tablet Take 10 mg by mouth daily.    [provider]  clobetasol ointment (TEMOVATE) 7.16 % Apply 1 application topically 2 (two) times daily. Twice a day x 4 weeks, then once a day x 4 weeks, then twice a week. 12/02/19   Tamela Gammon, NP  Continuous Blood Gluc Sensor (FREESTYLE LIBRE 2 SENSOR) MISC 1 Device by Does not apply route every 14 (fourteen) days. 12/16/19   Renato Shin, MD  diclofenac Sodium (VOLTAREN) 1 % GEL Apply 2 g topically 4 (four) times daily. 11/09/19   Aundra Dubin, PA-C  glucose blood [facility] VERIO) test strip Use as instructed 02/09/19   Abby Potash, PA-C  Insulin Glargine [facility]) 100 UNIT/ML Inject 50 Units into the skin every morning. 02/01/20   Renato Shin, MD  ondansetron (ZOFRAN) 4 MG tablet Take 4 mg by mouth every 8 (eight) hours as needed for nausea or vomiting.    [provider]  simvastatin (ZOCOR) 20 MG tablet TAKE 1 TABLET BY MOUTH EVERYDAY AT BEDTIME 12/13/19   [name], NP-C    Family History Family History  Problem Relation Age of Onset  . Heart failure Mother 62       chf  . Diabetes Mother   . High blood pressure Mother   . Heart disease Mother   . Sudden death Mother   . Kidney disease Mother   . Obesity Mother   . Heart failure Father   . Heart disease Father   . Sudden death Father   . Alcoholism Father   . Cancer Brother 27       prostate  . Hypertension Maternal Uncle   . Hyperlipidemia Brother   . Diabetes Brother   . Colon cancer Maternal Grandfather    . Colon polyps Brother   . Diabetes Brother   . Hyperlipidemia Brother   . Stomach cancer Neg Hx   . Esophageal cancer Neg Hx   . Rectal cancer Neg Hx   . Liver cancer Neg Hx   . Breast cancer Neg Hx     Social History Social History   Tobacco Use  . Smoking status:  Never Smoker  . Smokeless tobacco: Never Used  Vaping Use  . Vaping Use: Never used  Substance Use Topics  . Alcohol use: No  . Drug use: No     Allergies   Naprosyn [naproxen]   Review of Systems Review of Systems  Constitutional: Negative for appetite change, chills and fever.  HENT: Negative for congestion, ear pain, rhinorrhea, sinus pressure, sinus pain and sore throat.   Eyes: Negative for redness and visual disturbance.  Respiratory: Negative for cough, chest tightness, shortness of breath and wheezing.   Cardiovascular: Positive for leg swelling. Negative for chest pain and palpitations.  Gastrointestinal: Positive for nausea. Negative for abdominal pain, constipation, diarrhea and vomiting.  Genitourinary: Negative for dysuria, frequency and urgency.  Musculoskeletal: Negative for myalgias.  Neurological: Positive for light-headedness and headaches. Negative for dizziness and weakness.  Psychiatric/Behavioral: Negative for confusion.  All other systems reviewed and are negative.    Physical Exam Triage Vital Signs ED Triage Vitals  Enc Vitals Group     BP 04/04/20 1939 132/72     Pulse Rate 04/04/20 1939 94     Resp 04/04/20 1939 (!) 21     Temp 04/04/20 1939 98.8 F (37.1 C)     Temp Source 04/04/20 1939 Oral     SpO2 04/04/20 1939 98 %     Weight --      Height --      Head Circumference --      Peak Flow --      Pain Score 04/04/20 1935 0     Pain Loc --      Pain Edu? --      Excl. in [place]? --    No data found.  Updated Vital Signs BP 132/72 (BP Location: Right Arm) Comment (BP Location): large cuff  Pulse 94   Temp 98.8 F (37.1 C) (Oral)   Resp (!) 21   SpO2 98%    Visual Acuity Right Eye Distance:   Left Eye Distance:   Bilateral Distance:    Right Eye Near:   Left Eye Near:    Bilateral Near:     Physical Exam Vitals reviewed.  Constitutional:      General: She is not in acute distress.    Appearance: Normal appearance. She is not ill-appearing.  HENT:     Head: Normocephalic and atraumatic.     Right Ear: Hearing, tympanic membrane, ear canal and external ear normal. No drainage or tenderness. No middle ear effusion. There is no impacted cerumen. No mastoid tenderness. Tympanic membrane is not perforated, erythematous, retracted or bulging.     Left Ear: Hearing, tympanic membrane, ear canal and external ear normal. No drainage or tenderness.  No middle ear effusion. There is no impacted cerumen. No mastoid tenderness. Tympanic membrane is not perforated, erythematous, retracted or bulging.     Nose:     Right Sinus: No maxillary sinus tenderness or frontal sinus tenderness.     Left Sinus: No maxillary sinus tenderness or frontal sinus tenderness.     Mouth/Throat:     Mouth: Mucous membranes are moist.     Pharynx: Uvula midline. No oropharyngeal exudate or posterior oropharyngeal erythema.     Tonsils: No tonsillar exudate.  Eyes:     Extraocular Movements: Extraocular movements intact.     Pupils: Pupils are equal, round, and reactive to light.  Cardiovascular:     Rate and Rhythm: Normal rate and regular rhythm.     Pulses:  Radial pulses are 2+ on the right side and 2+ on the left side.     Heart sounds: Normal heart sounds.  Pulmonary:     Effort: Pulmonary effort is normal.     Breath sounds: Normal breath sounds and air entry. No decreased breath sounds, wheezing, rhonchi or rales.  Musculoskeletal:     Right lower leg: 1+ Edema present.     Left lower leg: 1+ Edema present.     Comments: Negative homan sign   Neurological:     Mental Status: She is alert.     Cranial Nerves: Cranial nerves are intact. No  cranial nerve deficit or facial asymmetry.     Sensory: Sensation is intact.     Motor: Motor function is intact. No weakness.     Coordination: Coordination is intact. Romberg sign negative.     Gait: Gait is intact. Gait normal.     Comments: CN 2-12 grossly intact. Strength 5/5 in UEs and LEs. Sensation intact in UEs and LEs. Gait normal.   Psychiatric:        Attention and Perception: Attention and perception normal.        Mood and Affect: Mood and affect normal.      UC Treatments / Results  Labs (all labs ordered are listed, but only abnormal results are displayed) Labs Reviewed  SARS CORONAVIRUS 2 (TAT 6-24 HRS)    EKG   Radiology No results found.  Procedures Procedures (including critical care time)  Medications Ordered in UC Medications - No data to display  Initial Impression / Assessment and Plan / UC Course  I have reviewed the triage vital signs and the nursing notes.  Pertinent labs & imaging results that were available during my care of the patient were reviewed by me and considered in my medical decision making (see chart for details).     This patient is a 55 year old female presenting with two days of constant lightheadedness, 5/10 headaches, 1+ bilateral pedal edema, and nausea. She denies chest pain, shortness of breath, URI symptoms; denies history of cardiopulmonary disease.   EKG today NSR, unchanged from 10/2019 EKG.  Covid sent. Negative.   Discharged this patient to home with STRICT return precautions. She must follow-up with her PCP TOMORROW 04/05/2020. She must head to the ED IMMEDIATELY if  left-sided chest pain, pain down left arm, worsening of dizziness, loss of consciousness, worsening of headache, worst headache of life, etc- patient verbalizes understanding and agreement.   For diabetes, continue monitoring sugars at home. Follow-up with PCP if sugars >150s at home.   Spent over 40 minutes obtaining H&P, performing physical,  interpreting EKG, discussing results, treatment plan and plan for follow-up with patient. Patient agrees with plan.     Final Clinical Impressions(s) / UC Diagnoses   Final diagnoses:  Dizziness  Pedal edema  Nausea without vomiting  Acute nonintractable headache, unspecified headache type  Type 2 diabetes mellitus without complication, unspecified whether long term insulin use ([place])  Encounter for screening for COVID-19     Discharge Instructions     -Please elevate your feet to help reduce swelling. -Make sure you drink plenty of fluids.  -Call your primary care provider tomorrow and schedule a follow-up appointment for tomorrow. This must occur tomorrow.  -If you develop left-sided chest pain, pain down left arm, worsening of dizziness, loss of consciousness, worsening of headache, worst headache of life- etc- call 911 IMMEDIATELY.     ED Prescriptions    None  PDMP not reviewed this encounter.   Hazel Sams, PA-C 04/05/20 0803    Hazel Sams, PA-C 04/05/20 [phone]

## 2020-04-05 ENCOUNTER — Other Ambulatory Visit: Payer: Self-pay | Admitting: Endocrinology

## 2020-04-05 DIAGNOSIS — E1139 Type 2 diabetes mellitus with other diabetic ophthalmic complication: Secondary | ICD-10-CM

## 2020-04-05 DIAGNOSIS — IMO0002 Reserved for concepts with insufficient information to code with codable children: Secondary | ICD-10-CM

## 2020-04-05 LAB — SARS CORONAVIRUS 2 (TAT 6-24 HRS): SARS Coronavirus 2: NEGATIVE

## 2020-06-11 ENCOUNTER — Other Ambulatory Visit: Payer: Self-pay | Admitting: Family Medicine

## 2020-06-11 DIAGNOSIS — K219 Gastro-esophageal reflux disease without esophagitis: Secondary | ICD-10-CM

## 2020-06-14 ENCOUNTER — Other Ambulatory Visit: Payer: Self-pay | Admitting: Family Medicine

## 2020-06-14 ENCOUNTER — Other Ambulatory Visit: Payer: Self-pay | Admitting: Endocrinology

## 2020-06-14 NOTE — Telephone Encounter (Signed)
Editor: Rigoberto Noel, MD (Physician)              Given the submassive PE in 2020 and unprovoked nature, lifelong anticoagulation recommended       Ok to refill per vickie since Pulmonology said statement above

## 2020-06-29 ENCOUNTER — Other Ambulatory Visit: Payer: Self-pay | Admitting: Family Medicine

## 2020-07-17 ENCOUNTER — Encounter (INDEPENDENT_AMBULATORY_CARE_PROVIDER_SITE_OTHER): Payer: 59 | Admitting: Ophthalmology

## 2020-07-17 ENCOUNTER — Other Ambulatory Visit: Payer: Self-pay

## 2020-07-17 DIAGNOSIS — E113513 Type 2 diabetes mellitus with proliferative diabetic retinopathy with macular edema, bilateral: Secondary | ICD-10-CM | POA: Diagnosis not present

## 2020-07-17 DIAGNOSIS — H4312 Vitreous hemorrhage, left eye: Secondary | ICD-10-CM

## 2020-07-17 DIAGNOSIS — H35033 Hypertensive retinopathy, bilateral: Secondary | ICD-10-CM

## 2020-07-17 DIAGNOSIS — I10 Essential (primary) hypertension: Secondary | ICD-10-CM

## 2020-07-18 ENCOUNTER — Ambulatory Visit (INDEPENDENT_AMBULATORY_CARE_PROVIDER_SITE_OTHER): Payer: 59 | Admitting: Endocrinology

## 2020-07-18 VITALS — BP 144/80 | HR 70 | Ht 63.0 in | Wt 250.6 lb

## 2020-07-18 DIAGNOSIS — E118 Type 2 diabetes mellitus with unspecified complications: Secondary | ICD-10-CM

## 2020-07-18 DIAGNOSIS — E1165 Type 2 diabetes mellitus with hyperglycemia: Secondary | ICD-10-CM | POA: Diagnosis not present

## 2020-07-18 DIAGNOSIS — IMO0002 Reserved for concepts with insufficient information to code with codable children: Secondary | ICD-10-CM

## 2020-07-18 LAB — POCT GLYCOSYLATED HEMOGLOBIN (HGB A1C): Hemoglobin A1C: 8.4 % — AB (ref 4.0–5.6)

## 2020-07-18 MED ORDER — TRULICITY 1.5 MG/0.5ML ~~LOC~~ SOAJ: 1.5000 mg | SUBCUTANEOUS | 3 refills | Status: DC

## 2020-07-18 MED ORDER — BASAGLAR KWIKPEN 100 UNIT/ML ~~LOC~~ SOPN: 45.0000 [IU] | PEN_INJECTOR | SUBCUTANEOUS | 3 refills | Status: DC

## 2020-07-18 NOTE — Patient Instructions (Addendum)
check your blood sugar 4 times a day: before the 3 meals, and at bedtime.  also check if you have symptoms of your blood sugar being too high or too low.  please keep a record of the readings and bring it to your next appointment here (or you can bring the meter itself).  You can write it on any piece of paper.  please call us sooner if your blood sugar goes below 70, or if you have a lot of readings over 200. I have sent a prescription to your pharmacy, to increase the Trulicity, and:  Please increase the Basaglar to 45 units each morning, and:  Please continue the same other diabetes medications.  Please come back for a follow-up appointment in 2 months.

## 2020-07-18 NOTE — Progress Notes (Signed)
Subjective:    Patient ID: Victoria Padilla, female    DOB: 10/06/1965, 55 y.o.   MRN: [id number]  HPI Pt returns for f/u of diabetes mellitus: DM type: Insulin-requiring type 2 Dx'ed: 3354 Complications: DR Therapy: insulin since 5625, Trulicity, and 2 oral meds.   GDM: 1998, but DM persisted after the pregnancy.   DKA: never Severe hypoglycemia: never Pancreatitis: never Pancreatic imaging: normal on 2017 [place].  Other: she declined multiple daily injections. chronic abd pain and nausea have limited rx options.  she declines continuous glucose monitor; she also has thyroid pseudonodule  Interval history: pt says cbg varies from 70-200.  pt states she feels well in general.  Pt says she never misses meds. She says Trulicity causes nausea, but she can live with it.  She declines to use continuous glucose monitor.   Past Medical History:  Diagnosis Date  . Acid reflux   . Anemia   . Arthritis   . Asthma   . Cervical dysplasia   . Diabetes mellitus   . DVT (deep venous thrombosis) ([place])   . Gallbladder problem   . GERD (gastroesophageal reflux disease)   . Heart murmur   . Hx of blood clots   . Hyperlipidemia   . Hypertension   . LBP (low back pain)   . Liver problem   . Obesity   . OSA (obstructive sleep apnea) 03/22/2018   New diagnosis. Mild.   . Pulmonary embolism ([place])   . Sarcoidosis   . Sleep apnea   . Vitamin D deficiency     Past Surgical History:  Procedure Laterality Date  . BREAST BIOPSY Left 2019  . BUNIONECTOMY    . CHOLECYSTECTOMY    . COLPOSCOPY    . GYNECOLOGIC CRYOSURGERY    . KNEE ARTHROSCOPY    . TUBAL LIGATION      Social History   Socioeconomic History  . Marital status: Single    Spouse name: Not on file  . Number of children: 3  . Years of education: Not on file  . Highest education level: Not on file  Occupational History  . Occupation: Careers adviser: [employer]  Tobacco Use  . Smoking status: Never Smoker   . Smokeless tobacco: Never Used  Vaping Use  . Vaping Use: Never used  Substance and Sexual Activity  . Alcohol use: No  . Drug use: No  . Sexual activity: Not Currently    Birth control/protection: Surgical  Other Topics Concern  . Not on file  Social History Narrative  . Not on file   Social Determinants of Health   Financial Resource Strain: Not on file  Food Insecurity: Not on file  Transportation Needs: Not on file  Physical Activity: Not on file  Stress: Not on file  Social Connections: Not on file  Intimate Partner Violence: Not on file    Current Outpatient Medications on File Prior to Visit  Medication Sig Dispense Refill  . amLODipine (NORVASC) 5 MG tablet Take 1 tablet (5 mg total) by mouth daily. 90 tablet 1  . B-D ULTRAFINE III SHORT PEN 31G X 8 MM MISC USE TO INJECT BASAGLAR DAILY 100 each 0  . benazepril (LOTENSIN) 40 MG tablet TAKE 1 TABLET BY MOUTH EVERY DAY 90 tablet 0  . BESIVANCE 0.6 % SUSP SMARTSIG:1 In Eye(s)    . cetirizine (ZYRTEC) 10 MG tablet Take 10 mg by mouth daily.    . cholecalciferol (VITAMIN D)  1000 units tablet Take 2,000 Units by mouth at bedtime.     . clobetasol ointment (TEMOVATE) 1.61 % Apply 1 application topically 2 (two) times daily. Twice a day x 4 weeks, then once a day x 4 weeks, then twice a week. 30 g 2  . Continuous Blood Gluc Sensor (FREESTYLE LIBRE 2 SENSOR) MISC 1 Device by Does not apply route every 14 (fourteen) days. 6 each 3  . dapagliflozin propanediol (FARXIGA) 10 MG TABS tablet Take 1 tablet (10 mg total) by mouth daily before breakfast. 90 tablet 3  . diclofenac Sodium (VOLTAREN) 1 % GEL Apply 2 g topically 4 (four) times daily. 150 g 2  . glucose blood (ONETOUCH VERIO) test strip Use as instructed 100 each 0  . hydrochlorothiazide (HYDRODIURIL) 25 MG tablet TAKE 1 TABLET BY MOUTH EVERY DAY 90 tablet 3  . metFORMIN (GLUCOPHAGE-XR) 500 MG 24 hr tablet TAKE 1 TABLET BY MOUTH TWICE A DAY 180 tablet 3  . metoprolol  tartrate (LOPRESSOR) 25 MG tablet TAKE 1 TABLET BY MOUTH TWICE A DAY 180 tablet 0  . Omega-3 Fatty Acids (FISH OIL PO) Take 1 capsule by mouth at bedtime. Reported on 08/08/2015    . omeprazole (PRILOSEC) 40 MG capsule TAKE 1 CAPSULE BY MOUTH EVERY DAY 90 capsule 0  . ondansetron (ZOFRAN) 4 MG tablet Take 4 mg by mouth every 8 (eight) hours as needed for nausea or vomiting.    Glory Rosebush Delica Lancets 09U MISC USE TO MONITOR GLUCOSE LEVELS THREE TIMES DAILY E11.8, E11.65 100 each 2  . simvastatin (ZOCOR) 20 MG tablet TAKE 1 TABLET BY MOUTH EVERYDAY AT BEDTIME 90 tablet 0  . XARELTO 10 MG TABS tablet TAKE 1 TABLET BY MOUTH EVERY DAY 90 tablet 1   No current facility-administered medications on file prior to visit.    Allergies  Allergen Reactions  . Naprosyn [Naproxen] Nausea Only    Family History  Problem Relation Age of Onset  . Heart failure Mother 47       chf  . Diabetes Mother   . High blood pressure Mother   . Heart disease Mother   . Sudden death Mother   . Kidney disease Mother   . Obesity Mother   . Heart failure Father   . Heart disease Father   . Sudden death Father   . Alcoholism Father   . Cancer Brother 109       prostate  . Hypertension Maternal Uncle   . Hyperlipidemia Brother   . Diabetes Brother   . Colon cancer Maternal Grandfather   . Colon polyps Brother   . Diabetes Brother   . Hyperlipidemia Brother   . Stomach cancer Neg Hx   . Esophageal cancer Neg Hx   . Rectal cancer Neg Hx   . Liver cancer Neg Hx   . Breast cancer Neg Hx     BP (!) 144/80 (BP Location: Right Arm, Patient Position: Sitting, Cuff Size: Large)   Pulse 70   Ht 5\' 3"  (1.6 m)   Wt 250 lb 9.6 oz (113.7 kg)   SpO2 95%   BMI 44.39 kg/m    Review of Systems Nausea is much less now.    Objective:   Physical Exam VITAL SIGNS:  See vs page GENERAL: no distress Pulses: dorsalis pedis intact bilat.   MSK: no deformity of the feet CV: 1+ bilat leg edema.   Skin:  no ulcer  on the feet.  normal color and temp  on the feet. Neuro: sensation is intact to touch on the feet   A1c=8.4%     Assessment & Plan:  Insulin-requiring type 2 DM: uncontrolled.   Patient Instructions  check your blood sugar 4 times a day: before the 3 meals, and at bedtime.  also check if you have symptoms of your blood sugar being too high or too low.  please keep a record of the readings and bring it to your next appointment here (or you can bring the meter itself).  You can write it on any piece of paper.  please call us sooner if your blood sugar goes below 70, or if you have a lot of readings over 200. I have sent a prescription to your pharmacy, to increase the Trulicity, and:  Please increase the Basaglar to 45 units each morning, and:  Please continue the same other diabetes medications.  Please come back for a follow-up appointment in 2 months.

## 2020-07-19 ENCOUNTER — Encounter: Payer: Self-pay | Admitting: Internal Medicine

## 2020-07-20 ENCOUNTER — Other Ambulatory Visit: Payer: Self-pay | Admitting: Family Medicine

## 2020-07-20 ENCOUNTER — Encounter: Payer: Self-pay | Admitting: Internal Medicine

## 2020-07-20 DIAGNOSIS — I10 Essential (primary) hypertension: Secondary | ICD-10-CM

## 2020-07-20 DIAGNOSIS — I152 Hypertension secondary to endocrine disorders: Secondary | ICD-10-CM

## 2020-07-20 DIAGNOSIS — E1159 Type 2 diabetes mellitus with other circulatory complications: Secondary | ICD-10-CM

## 2020-07-27 ENCOUNTER — Encounter: Payer: Self-pay | Admitting: Family Medicine

## 2020-08-14 ENCOUNTER — Encounter (INDEPENDENT_AMBULATORY_CARE_PROVIDER_SITE_OTHER): Payer: 59 | Admitting: Ophthalmology

## 2020-08-14 ENCOUNTER — Other Ambulatory Visit: Payer: Self-pay

## 2020-08-14 DIAGNOSIS — H4312 Vitreous hemorrhage, left eye: Secondary | ICD-10-CM

## 2020-08-14 DIAGNOSIS — H2513 Age-related nuclear cataract, bilateral: Secondary | ICD-10-CM

## 2020-08-14 DIAGNOSIS — H35033 Hypertensive retinopathy, bilateral: Secondary | ICD-10-CM | POA: Diagnosis not present

## 2020-08-14 DIAGNOSIS — I10 Essential (primary) hypertension: Secondary | ICD-10-CM

## 2020-08-14 DIAGNOSIS — E113513 Type 2 diabetes mellitus with proliferative diabetic retinopathy with macular edema, bilateral: Secondary | ICD-10-CM | POA: Diagnosis not present

## 2020-08-14 DIAGNOSIS — H43813 Vitreous degeneration, bilateral: Secondary | ICD-10-CM

## 2020-08-23 ENCOUNTER — Other Ambulatory Visit: Payer: Self-pay | Admitting: Endocrinology

## 2020-09-08 ENCOUNTER — Other Ambulatory Visit: Payer: Self-pay | Admitting: Family Medicine

## 2020-09-08 DIAGNOSIS — K219 Gastro-esophageal reflux disease without esophagitis: Secondary | ICD-10-CM

## 2020-09-11 ENCOUNTER — Encounter (INDEPENDENT_AMBULATORY_CARE_PROVIDER_SITE_OTHER): Payer: 59 | Admitting: Ophthalmology

## 2020-09-21 ENCOUNTER — Ambulatory Visit: Payer: 59 | Admitting: Endocrinology

## 2020-10-05 ENCOUNTER — Other Ambulatory Visit: Payer: Self-pay | Admitting: Family Medicine

## 2020-10-09 ENCOUNTER — Other Ambulatory Visit: Payer: Self-pay | Admitting: Family Medicine

## 2020-10-09 DIAGNOSIS — I10 Essential (primary) hypertension: Secondary | ICD-10-CM

## 2020-10-15 IMAGING — DX DG CHEST 2V
2 series · 2 of 2 positions shown · non-contrast
Comparison: 04/25/2017 chest radiograph

CLINICAL DATA: 52 y/o F; 6 weeks of cough. Upper anterior chest
pressure and swelling at the proximal right clavicle.

EXAM:
CHEST - 2 VIEW

[dg chest 2 view (1 of 2)]
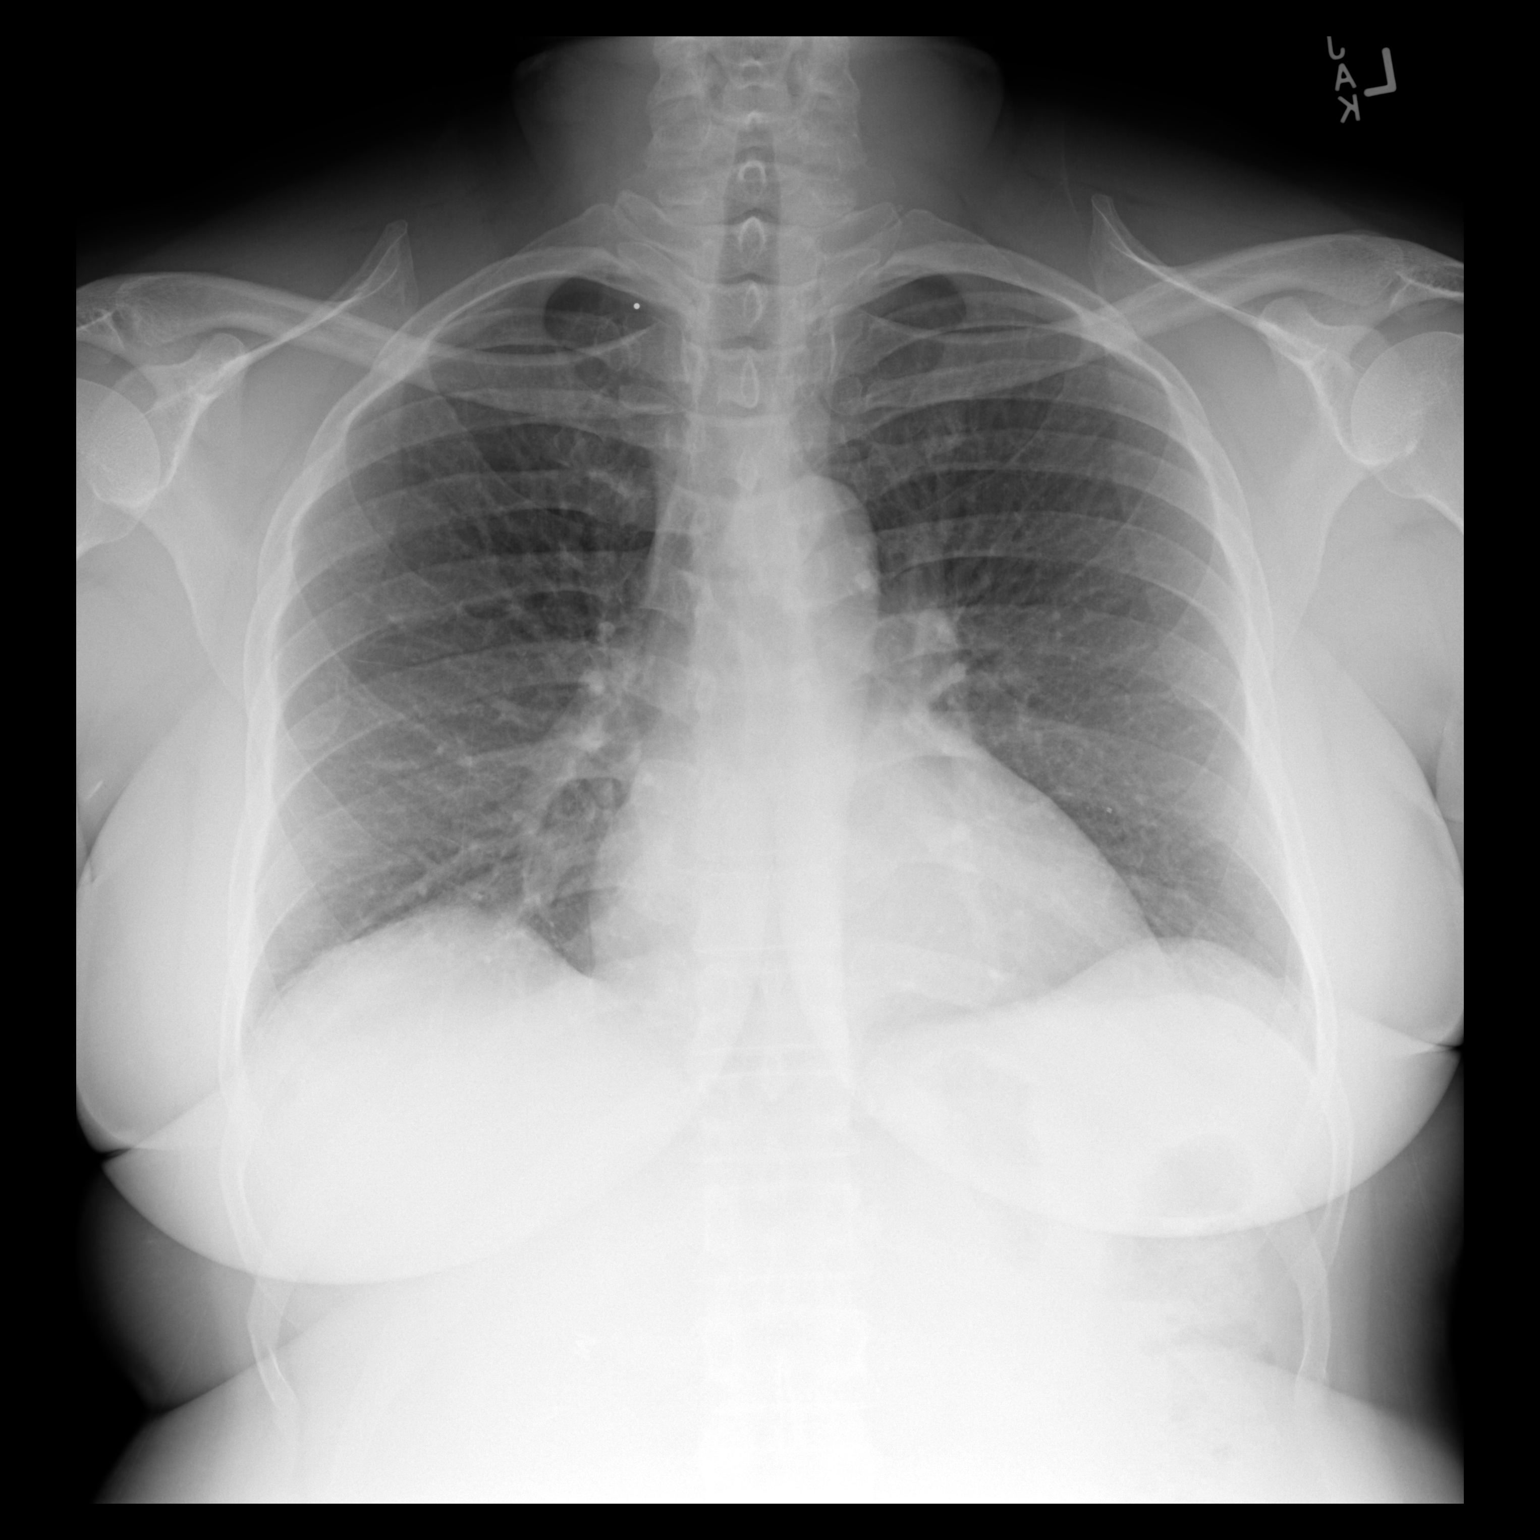

[dg chest 2 view (2 of 2)]
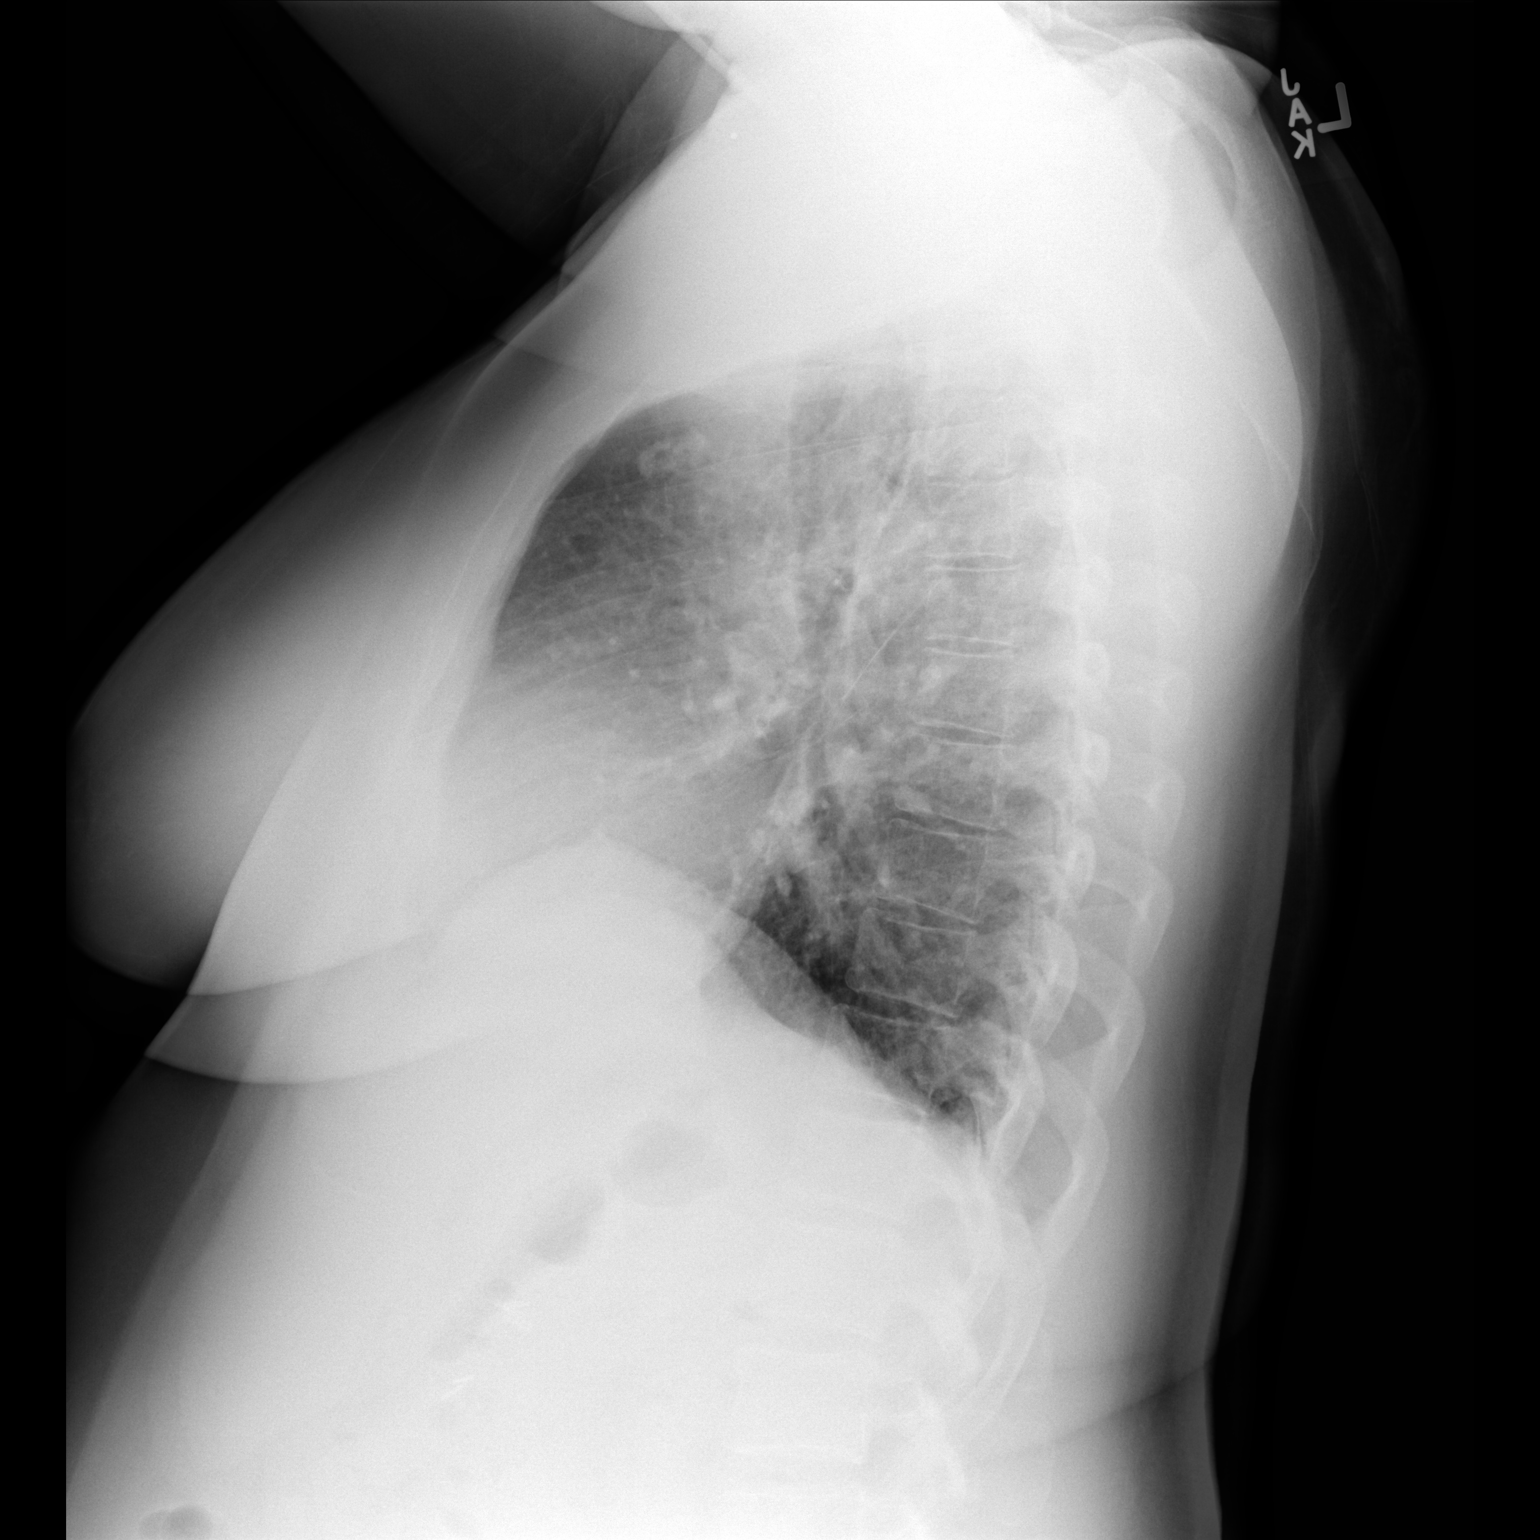

[2 of 2 positions shown; findings below may reference images not displayed]

FINDINGS: Stable heart size and mediastinal contours are within normal limits.
Both lungs are clear. The visualized skeletal structures are
unremarkable.
IMPRESSION: No acute pulmonary process identified.

## 2020-10-20 ENCOUNTER — Other Ambulatory Visit: Payer: Self-pay | Admitting: Family Medicine

## 2020-10-20 ENCOUNTER — Other Ambulatory Visit: Payer: Self-pay | Admitting: Endocrinology

## 2020-10-20 DIAGNOSIS — E1159 Type 2 diabetes mellitus with other circulatory complications: Secondary | ICD-10-CM

## 2020-10-20 DIAGNOSIS — I152 Hypertension secondary to endocrine disorders: Secondary | ICD-10-CM

## 2020-10-24 ENCOUNTER — Other Ambulatory Visit: Payer: Self-pay

## 2020-10-24 MED ORDER — BASAGLAR KWIKPEN 100 UNIT/ML ~~LOC~~ SOPN: 45.0000 [IU] | PEN_INJECTOR | SUBCUTANEOUS | 3 refills | Status: DC

## 2020-11-03 ENCOUNTER — Other Ambulatory Visit: Payer: Self-pay | Admitting: Family Medicine

## 2020-11-03 NOTE — Telephone Encounter (Signed)
Received request for refill on the pts. Victoria Padilla last apt was 10/16/19.

## 2020-11-08 ENCOUNTER — Other Ambulatory Visit: Payer: Self-pay

## 2020-11-08 ENCOUNTER — Encounter (INDEPENDENT_AMBULATORY_CARE_PROVIDER_SITE_OTHER): Payer: 59 | Admitting: Ophthalmology

## 2020-11-08 DIAGNOSIS — E113513 Type 2 diabetes mellitus with proliferative diabetic retinopathy with macular edema, bilateral: Secondary | ICD-10-CM | POA: Diagnosis not present

## 2020-11-08 DIAGNOSIS — H2513 Age-related nuclear cataract, bilateral: Secondary | ICD-10-CM

## 2020-11-08 DIAGNOSIS — H4312 Vitreous hemorrhage, left eye: Secondary | ICD-10-CM | POA: Diagnosis not present

## 2020-11-08 DIAGNOSIS — I10 Essential (primary) hypertension: Secondary | ICD-10-CM | POA: Diagnosis not present

## 2020-11-08 DIAGNOSIS — H35033 Hypertensive retinopathy, bilateral: Secondary | ICD-10-CM | POA: Diagnosis not present

## 2020-11-08 DIAGNOSIS — H43813 Vitreous degeneration, bilateral: Secondary | ICD-10-CM

## 2020-11-20 ENCOUNTER — Other Ambulatory Visit: Payer: Self-pay

## 2020-11-20 ENCOUNTER — Other Ambulatory Visit: Payer: Self-pay | Admitting: Family Medicine

## 2020-11-20 ENCOUNTER — Encounter (INDEPENDENT_AMBULATORY_CARE_PROVIDER_SITE_OTHER): Payer: 59 | Admitting: Ophthalmology

## 2020-11-20 DIAGNOSIS — I10 Essential (primary) hypertension: Secondary | ICD-10-CM

## 2020-11-20 DIAGNOSIS — E113512 Type 2 diabetes mellitus with proliferative diabetic retinopathy with macular edema, left eye: Secondary | ICD-10-CM

## 2020-11-21 ENCOUNTER — Encounter (INDEPENDENT_AMBULATORY_CARE_PROVIDER_SITE_OTHER): Payer: 59 | Admitting: Ophthalmology

## 2020-12-05 ENCOUNTER — Ambulatory Visit (INDEPENDENT_AMBULATORY_CARE_PROVIDER_SITE_OTHER): Payer: 59 | Admitting: Nurse Practitioner

## 2020-12-05 ENCOUNTER — Encounter: Payer: Self-pay | Admitting: Nurse Practitioner

## 2020-12-05 ENCOUNTER — Other Ambulatory Visit: Payer: Self-pay

## 2020-12-05 VITALS — BP 134/84 | HR 80 | Resp 14 | Ht 63.0 in | Wt 246.0 lb

## 2020-12-05 DIAGNOSIS — Z01419 Encounter for gynecological examination (general) (routine) without abnormal findings: Secondary | ICD-10-CM

## 2020-12-05 DIAGNOSIS — N898 Other specified noninflammatory disorders of vagina: Secondary | ICD-10-CM

## 2020-12-05 DIAGNOSIS — L900 Lichen sclerosus et atrophicus: Secondary | ICD-10-CM | POA: Diagnosis not present

## 2020-12-05 DIAGNOSIS — N912 Amenorrhea, unspecified: Secondary | ICD-10-CM

## 2020-12-05 LAB — WET PREP FOR TRICH, YEAST, CLUE

## 2020-12-05 NOTE — Progress Notes (Signed)
Victoria Padilla 11-13-1965 [id number]   History:  55 y.o. G3P3003 presents for annual exam. Complains of vaginal itching that occurs all the time. She does have history of probable lichen sclerosus with recommendations to use clobetasol ointment for management but has not been using.  Had a cycle in June or July, last cycle prior to that was in October 2021. Having some menopausal symptoms. Zebulon 30 11/2019. Benign endometrial biopsy at that time after episode of bleeding and mildly thickened endometrium. 1998 cryo, subsequent paps normal. 2019 benign left breast biopsy. History of PE on Xarelto. Endocrinology manages diabetes.   Gynecologic History No LMP recorded (within months). (Menstrual status: Perimenopausal).   Contraception: abstinence  Health maintenance Last Pap: 04/23/2017. Results were: normal, 5-year repeat Last mammogram: 12/08/2018. Results were: normal Last colonoscopy: 02/04/2020. Results were: lipoma, diverticula. 5-year recall Last Dexa: Not indicated  Past medical history, past surgical history, family history and social history were all reviewed and documented in the EPIC chart. 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   ROS:  A ROS was performed and pertinent positives and negatives are included.  Exam:  Vitals:   12/05/20 1508  BP: 134/84  Pulse: 80  Resp: 14  Weight: 246 lb (111.6 kg)  Height: 5\' 3"  (1.6 m)    Body mass index is 43.58 kg/m.  General appearance:  Normal Thyroid:  Symmetrical, normal in size, without palpable masses or nodularity. Respiratory  Auscultation:  Clear without wheezing or rhonchi Cardiovascular  Auscultation:  Regular rate, without rubs, murmurs or gallops  Edema/varicosities:  Not grossly evident Abdominal  Soft,nontender, without masses, guarding or rebound.  Liver/spleen:  No organomegaly noted  Hernia:  None appreciated  Skin  Inspection:  Grossly normal   Breasts: Examined lying and sitting.   Right: Without masses,  retractions, discharge or axillary adenopathy.   Left: Without masses, retractions, discharge or axillary adenopathy. Gentitourinary   Inguinal/mons:  Normal without inguinal adenopathy  External genitalia:  Thin, pearly skin consistent with LC. Excoriation from scratching  BUS/Urethra/Skene's glands:  Normal  Vagina:  Normal  Cervix:  Normal  Uterus:  Difficult to palpate due to body habitus but no gross masses or tenderness  Adnexa/parametria:     Rt: Without masses or tenderness.   Lt: Without masses or tenderness.  Anus and perineum: Normal  Digital rectal exam: Normal sphincter tone without palpated masses or tenderness  Wet prep negative  Patient informed chaperone available to be present for breast and pelvic exam. Patient has requested no chaperone to be present. Patient has been advised what will be completed during breast and pelvic exam.   Assessment/Plan:  55 y.o. G3P3003 for annual exam.   Well female exam with routine gynecological exam - Education provided on SBEs, importance of preventative screenings, current guidelines, high calcium diet, regular exercise, and multivitamin daily.  Labs with PCP.   Vaginal itching - Plan: WET PREP FOR [place], YEAST, CLUE. Wet prep negative.   Amenorrhea - Plan: Follicle stimulating hormone. Has been having 1 cycle per year for a few years. Will check FSH today (55 one year ago). Had period-like bleeding in June or July, LMP prior was 11/2019. At that time she had U/S with mildly thickened endometrium, benign endo biopsy. Having some menopausal symptoms.   Lichen sclerosus - Has not been using ointment. Recommendation to use Clobetasol ointment consistently for best results. She is agreeable.   Screening for cervical cancer - Normal Pap history.  Will repeat at 5-year interval per guidelines.  Screening for breast cancer - Normal mammogram history. Overdue and encouraged to schedule soon. We discussed importance of preventative  screenings. Normal breast exam today.  Screening for colon cancer - 2021 colonoscopy. Will repeat at GI's recommended interval.   Follow-up in 1 year for annual.      Tamela Gammon [facility], 3:46 PM 12/05/2020

## 2020-12-06 ENCOUNTER — Ambulatory Visit: Payer: 59 | Admitting: Endocrinology

## 2020-12-06 ENCOUNTER — Encounter (INDEPENDENT_AMBULATORY_CARE_PROVIDER_SITE_OTHER): Payer: 59 | Admitting: Ophthalmology

## 2020-12-06 VITALS — BP 144/82 | HR 77 | Ht 63.0 in | Wt 245.8 lb

## 2020-12-06 DIAGNOSIS — E113311 Type 2 diabetes mellitus with moderate nonproliferative diabetic retinopathy with macular edema, right eye: Secondary | ICD-10-CM | POA: Diagnosis not present

## 2020-12-06 DIAGNOSIS — I10 Essential (primary) hypertension: Secondary | ICD-10-CM | POA: Diagnosis not present

## 2020-12-06 DIAGNOSIS — H43813 Vitreous degeneration, bilateral: Secondary | ICD-10-CM

## 2020-12-06 DIAGNOSIS — E119 Type 2 diabetes mellitus without complications: Secondary | ICD-10-CM

## 2020-12-06 DIAGNOSIS — E113512 Type 2 diabetes mellitus with proliferative diabetic retinopathy with macular edema, left eye: Secondary | ICD-10-CM

## 2020-12-06 DIAGNOSIS — Z794 Long term (current) use of insulin: Secondary | ICD-10-CM | POA: Diagnosis not present

## 2020-12-06 DIAGNOSIS — H35033 Hypertensive retinopathy, bilateral: Secondary | ICD-10-CM | POA: Diagnosis not present

## 2020-12-06 DIAGNOSIS — E11319 Type 2 diabetes mellitus with unspecified diabetic retinopathy without macular edema: Secondary | ICD-10-CM | POA: Diagnosis not present

## 2020-12-06 DIAGNOSIS — H2513 Age-related nuclear cataract, bilateral: Secondary | ICD-10-CM

## 2020-12-06 LAB — POCT GLYCOSYLATED HEMOGLOBIN (HGB A1C): Hemoglobin A1C: 7.3 % — AB (ref 4.0–5.6)

## 2020-12-06 MED ORDER — TRULICITY 3 MG/0.5ML ~~LOC~~ SOAJ: 3.0000 mg | SUBCUTANEOUS | 3 refills | Status: DC

## 2020-12-06 MED ORDER — BASAGLAR KWIKPEN 100 UNIT/ML ~~LOC~~ SOPN: 30.0000 [IU] | PEN_INJECTOR | SUBCUTANEOUS | 3 refills | Status: DC

## 2020-12-06 NOTE — Patient Instructions (Addendum)
Your blood pressure is high today.  Please see your primary care provider soon, to have it rechecked check your blood sugar 4 times a day: before the 3 meals, and at bedtime.  also check if you have symptoms of your blood sugar being too high or too low.  please keep a record of the readings and bring it to your next appointment here (or you can bring the meter itself).  You can write it on any piece of paper.  please call us sooner if your blood sugar goes below 70, or if you have a lot of readings over 200. I have sent a prescription to your pharmacy, to increase the Trulicity again, and:  Please increase the Basaglar to 30 units each morning, and:  Please continue the same other diabetes medications.  Please come back for a follow-up appointment in 3 months.

## 2020-12-06 NOTE — Progress Notes (Signed)
Subjective:    Patient ID: Victoria Padilla, female    DOB: [birth year], 55 y.o.   MRN: [id number]  HPI Pt returns for f/u of diabetes mellitus: DM type: Insulin-requiring type 2 Dx'ed: 0923 Complications: DR Therapy: insulin since 3007, Trulicity, and 2 oral meds.   GDM: 1998, but DM persisted after the pregnancy.   DKA: never Severe hypoglycemia: never Pancreatitis: never Pancreatic imaging: normal on 2017 [place].  Other: she declined multiple daily injections. chronic abd pain and nausea have limited rx options.  she declines continuous glucose monitor; she also has thyroid pseudonodule  Interval history: pt says cbg varies from 68-160.  pt states she feels well in general.  Pt says she never misses meds.  She seldom has hypoglycemia, and these episodes are mild.   Past Medical History:  Diagnosis Date   Acid reflux    Anemia    Arthritis    Asthma    Cervical dysplasia    Diabetes mellitus    DVT (deep venous thrombosis) (HCC)    Gallbladder problem    GERD (gastroesophageal reflux disease)    Heart murmur    Hx of blood clots    Hyperlipidemia    Hypertension    LBP (low back pain)    Liver problem    Obesity    OSA (obstructive sleep apnea) 03/22/2018   New diagnosis. Mild.    Pulmonary embolism (HCC)    Sarcoidosis    Sleep apnea    Vitamin D deficiency     Past Surgical History:  Procedure Laterality Date   BREAST BIOPSY Left 2019   BUNIONECTOMY     CHOLECYSTECTOMY     COLPOSCOPY     GYNECOLOGIC CRYOSURGERY     KNEE ARTHROSCOPY     TUBAL LIGATION      Social History   Socioeconomic History   Marital status: Single    Spouse name: Not on file   Number of children: 3   Years of education: Not on file   Highest education level: Not on file  Occupational History   Occupation: Careers adviser: [employer]  Tobacco Use   Smoking status: Never   Smokeless tobacco: Never  Vaping Use   Vaping Use: Never used  Substance and Sexual  Activity   Alcohol use: No   Drug use: No   Sexual activity: Not Currently    Birth control/protection: Surgical  Other Topics Concern   Not on file  Social History Narrative   Not on file   Social Determinants of Health   Financial Resource Strain: Not on file  Food Insecurity: Not on file  Transportation Needs: Not on file  Physical Activity: Not on file  Stress: Not on file  Social Connections: Not on file  Intimate Partner Violence: Not on file    Current Outpatient Medications on File Prior to Visit  Medication Sig Dispense Refill   amLODipine (NORVASC) 5 MG tablet TAKE 1 TABLET BY MOUTH EVERY DAY 90 tablet 0   B-D ULTRAFINE III SHORT PEN 31G X 8 MM MISC USE TO INJECT BASAGLAR DAILY- NEED APPT 100 each 0   benazepril (LOTENSIN) 40 MG tablet TAKE 1 TABLET BY MOUTH EVERY DAY 90 tablet 0   BESIVANCE 0.6 % SUSP SMARTSIG:1 In Eye(s)     cetirizine (ZYRTEC) 10 MG tablet Take 10 mg by mouth daily.     cholecalciferol (VITAMIN D) 1000 units tablet Take 2,000 Units by mouth at bedtime.  clobetasol ointment (TEMOVATE) 6.96 % Apply 1 application topically 2 (two) times daily. Twice a day x 4 weeks, then once a day x 4 weeks, then twice a week. 30 g 2   Continuous Blood Gluc Sensor (FREESTYLE LIBRE 2 SENSOR) MISC 1 Device by Does not apply route every 14 (fourteen) days. 6 each 3   dapagliflozin propanediol (FARXIGA) 10 MG TABS tablet Take 1 tablet (10 mg total) by mouth daily before breakfast. 90 tablet 3   diclofenac Sodium (VOLTAREN) 1 % GEL Apply 2 g topically 4 (four) times daily. 150 g 2   glucose blood (ONETOUCH VERIO) test strip Use as instructed 100 each 0   hydrochlorothiazide (HYDRODIURIL) 25 MG tablet TAKE 1 TABLET BY MOUTH EVERY DAY 90 tablet 3   metFORMIN (GLUCOPHAGE-XR) 500 MG 24 hr tablet TAKE 1 TABLET BY MOUTH TWICE A DAY 180 tablet 3   metoprolol tartrate (LOPRESSOR) 25 MG tablet TAKE 1 TABLET BY MOUTH TWICE A DAY 180 tablet 0   Omega-3 Fatty Acids (FISH OIL PO)  Take 1 capsule by mouth at bedtime. Reported on 08/08/2015     omeprazole (PRILOSEC) 40 MG capsule TAKE 1 CAPSULE BY MOUTH EVERY DAY 30 capsule 0   ondansetron (ZOFRAN) 4 MG tablet Take 4 mg by mouth every 8 (eight) hours as needed for nausea or vomiting.     OneTouch Delica Lancets 78L MISC USE TO MONITOR GLUCOSE LEVELS THREE TIMES DAILY E11.8, E11.65 100 each 2   rivaroxaban (XARELTO) 10 MG TABS tablet Take 1 tablet (10 mg total) by mouth daily. Needs appointment for refills 30 tablet 0   simvastatin (ZOCOR) 20 MG tablet TAKE 1 TABLET BY MOUTH EVERYDAY AT BEDTIME 30 tablet 0   No current facility-administered medications on file prior to visit.    Allergies  Allergen Reactions   Naprosyn [Naproxen] Nausea Only    Family History  Problem Relation Age of Onset   Heart failure Mother 81       chf   Diabetes Mother    High blood pressure Mother    Heart disease Mother    Sudden death Mother    Kidney disease Mother    Obesity Mother    Heart failure Father    Heart disease Father    Sudden death Father    Alcoholism Father    Cancer Brother 8       prostate   Hypertension Maternal Uncle    Hyperlipidemia Brother    Diabetes Brother    Colon cancer Maternal Grandfather    Colon polyps Brother    Diabetes Brother    Hyperlipidemia Brother    Stomach cancer Neg Hx    Esophageal cancer Neg Hx    Rectal cancer Neg Hx    Liver cancer Neg Hx    Breast cancer Neg Hx     BP (!) 144/82 (BP Location: Right Arm, Patient Position: Sitting, Cuff Size: Large)   Pulse 77   Ht 5\' 3"  (1.6 m)   Wt 245 lb 12.8 oz (111.5 kg)   SpO2 98%   BMI 43.54 kg/m   Review of Systems Denies N/V/HB    Objective:   Physical Exam VITAL SIGNS:  See vs page GENERAL: no distress Pulses: dorsalis pedis intact bilat.   MSK: no deformity of the feet CV: 1+ bilat leg edema.   Skin:  no ulcer on the feet.  normal color and temp on the feet. Neuro: sensation is intact to touch on the feet.  Lab  Results  Component Value Date   HGBA1C 7.3 (A) 12/06/2020       Assessment & Plan:  Insulin-requiring type 2 DM: uncontrolled  Patient Instructions  Your blood pressure is high today.  Please see your primary care provider soon, to have it rechecked check your blood sugar 4 times a day: before the 3 meals, and at bedtime.  also check if you have symptoms of your blood sugar being too high or too low.  please keep a record of the readings and bring it to your next appointment here (or you can bring the meter itself).  You can write it on any piece of paper.  please call us sooner if your blood sugar goes below 70, or if you have a lot of readings over 200. I have sent a prescription to your pharmacy, to increase the Trulicity again, and:  Please increase the Basaglar to 30 units each morning, and:  Please continue the same other diabetes medications.  Please come back for a follow-up appointment in 3 months.

## 2020-12-09 ENCOUNTER — Other Ambulatory Visit: Payer: Self-pay | Admitting: Family Medicine

## 2020-12-09 DIAGNOSIS — E119 Type 2 diabetes mellitus without complications: Secondary | ICD-10-CM | POA: Insufficient documentation

## 2020-12-14 ENCOUNTER — Encounter: Payer: Self-pay | Admitting: Nurse Practitioner

## 2020-12-14 NOTE — Progress Notes (Signed)
Pt is schedule to come in for b/p check

## 2020-12-18 ENCOUNTER — Ambulatory Visit: Payer: 59 | Admitting: Family Medicine

## 2020-12-27 ENCOUNTER — Other Ambulatory Visit: Payer: Self-pay | Admitting: Nurse Practitioner

## 2020-12-27 DIAGNOSIS — L900 Lichen sclerosus et atrophicus: Secondary | ICD-10-CM

## 2020-12-28 NOTE — Telephone Encounter (Signed)
AEX 23/95/32 "Lichen sclerosus - Has not been using ointment. Recommendation to use Clobetasol ointment consistently for best results. She is agreeable."

## 2021-01-02 ENCOUNTER — Ambulatory Visit: Payer: 59 | Admitting: Family Medicine

## 2021-01-02 ENCOUNTER — Other Ambulatory Visit: Payer: Self-pay

## 2021-01-02 ENCOUNTER — Encounter: Payer: Self-pay | Admitting: Family Medicine

## 2021-01-02 VITALS — BP 132/82 | HR 81 | Temp 96.8°F | Wt 251.0 lb

## 2021-01-02 DIAGNOSIS — I10 Essential (primary) hypertension: Secondary | ICD-10-CM | POA: Diagnosis not present

## 2021-01-02 DIAGNOSIS — Z23 Encounter for immunization: Secondary | ICD-10-CM

## 2021-01-02 NOTE — Progress Notes (Signed)
   Subjective:    Patient ID: Victoria Padilla, female    DOB: 10-25-1965, 55 y.o.   MRN: [id number]  HPI She is here for consult concerning her blood pressure.  She was recently seen by Dr. Loanne Drilling and noted to have an elevated blood pressure.  She is here for follow-up visit on that.  She admits to not being very physically active.  Review of the record indicates she is behind on some of her immunizations.   Review of Systems     Objective:   Physical Exam Alert and in no distress.  Blood pressure is recorded.       Assessment & Plan:  Essential hypertension  Need for vaccination against Streptococcus pneumoniae - Plan: Pneumococcal polysaccharide vaccine 23-valent greater than or equal to 2yo subcutaneous/IM  Need for influenza vaccination - Plan: Flu Vaccine QUAD 6+ mos PF IM (Fluarix Quad PF)  Morbid obesity ([place]) The blood pressure today is much better.  She is not physically active.  I then discussed increasing physical activities by taking part of her breaks in the morning at lunch and afternoon to walk to get a total of 20 minutes.  Recommended 5 minutes in the morning 10 minutes at lunch and 5 minutes during the afternoon break to help with physical activity which should help with her pressure as well as weight.  Continue present medication. I will update her immunizations. I discussed in detail the need for COVID including the risks and benefits.  She will consider getting that.

## 2021-01-02 NOTE — Patient Instructions (Signed)
Use your breaks to your advantage.  Take at least 5 minutes of your morning break and go for a walk.  Take about 10 minutes or more for your lunch break to go for a walk in another 5 or so minutes in the afternoon.  That would give you 20 minutes a day that you are using to take better care of yourself

## 2021-01-03 ENCOUNTER — Encounter (INDEPENDENT_AMBULATORY_CARE_PROVIDER_SITE_OTHER): Payer: 59 | Admitting: Ophthalmology

## 2021-01-03 DIAGNOSIS — I10 Essential (primary) hypertension: Secondary | ICD-10-CM | POA: Diagnosis not present

## 2021-01-03 DIAGNOSIS — H35033 Hypertensive retinopathy, bilateral: Secondary | ICD-10-CM

## 2021-01-03 DIAGNOSIS — E113512 Type 2 diabetes mellitus with proliferative diabetic retinopathy with macular edema, left eye: Secondary | ICD-10-CM | POA: Diagnosis not present

## 2021-01-03 DIAGNOSIS — H4312 Vitreous hemorrhage, left eye: Secondary | ICD-10-CM

## 2021-01-03 DIAGNOSIS — H43813 Vitreous degeneration, bilateral: Secondary | ICD-10-CM

## 2021-01-03 DIAGNOSIS — E113311 Type 2 diabetes mellitus with moderate nonproliferative diabetic retinopathy with macular edema, right eye: Secondary | ICD-10-CM | POA: Diagnosis not present

## 2021-01-29 ENCOUNTER — Other Ambulatory Visit: Payer: Self-pay

## 2021-01-29 DIAGNOSIS — I152 Hypertension secondary to endocrine disorders: Secondary | ICD-10-CM

## 2021-01-29 MED ORDER — BENAZEPRIL HCL 40 MG PO TABS: 40.0000 mg | ORAL_TABLET | Freq: Every day | ORAL | 0 refills | Status: DC

## 2021-01-30 ENCOUNTER — Other Ambulatory Visit: Payer: Self-pay

## 2021-01-30 ENCOUNTER — Encounter (INDEPENDENT_AMBULATORY_CARE_PROVIDER_SITE_OTHER): Payer: 59 | Admitting: Ophthalmology

## 2021-01-30 DIAGNOSIS — H35033 Hypertensive retinopathy, bilateral: Secondary | ICD-10-CM | POA: Diagnosis not present

## 2021-01-30 DIAGNOSIS — I10 Essential (primary) hypertension: Secondary | ICD-10-CM | POA: Diagnosis not present

## 2021-01-30 DIAGNOSIS — E113512 Type 2 diabetes mellitus with proliferative diabetic retinopathy with macular edema, left eye: Secondary | ICD-10-CM | POA: Diagnosis not present

## 2021-01-30 DIAGNOSIS — E113311 Type 2 diabetes mellitus with moderate nonproliferative diabetic retinopathy with macular edema, right eye: Secondary | ICD-10-CM | POA: Diagnosis not present

## 2021-01-30 DIAGNOSIS — H43813 Vitreous degeneration, bilateral: Secondary | ICD-10-CM

## 2021-02-06 ENCOUNTER — Other Ambulatory Visit: Payer: Self-pay | Admitting: Endocrinology

## 2021-02-06 DIAGNOSIS — E119 Type 2 diabetes mellitus without complications: Secondary | ICD-10-CM

## 2021-02-14 ENCOUNTER — Other Ambulatory Visit: Payer: Self-pay

## 2021-02-14 MED ORDER — METOPROLOL TARTRATE 25 MG PO TABS: 25.0000 mg | ORAL_TABLET | Freq: Two times a day (BID) | ORAL | 0 refills | Status: DC

## 2021-02-27 ENCOUNTER — Other Ambulatory Visit: Payer: Self-pay

## 2021-02-27 ENCOUNTER — Encounter (INDEPENDENT_AMBULATORY_CARE_PROVIDER_SITE_OTHER): Payer: 59 | Admitting: Ophthalmology

## 2021-02-27 MED ORDER — RIVAROXABAN 10 MG PO TABS: 10.0000 mg | ORAL_TABLET | Freq: Every day | ORAL | 0 refills | Status: DC

## 2021-02-28 ENCOUNTER — Other Ambulatory Visit (INDEPENDENT_AMBULATORY_CARE_PROVIDER_SITE_OTHER): Payer: Self-pay | Admitting: Physician Assistant

## 2021-02-28 DIAGNOSIS — E119 Type 2 diabetes mellitus without complications: Secondary | ICD-10-CM

## 2021-03-01 NOTE — Telephone Encounter (Signed)
Pt last seen by Dr. Ukleja.  

## 2021-03-16 ENCOUNTER — Ambulatory Visit: Payer: 59 | Admitting: Endocrinology

## 2021-03-20 ENCOUNTER — Other Ambulatory Visit: Payer: Self-pay

## 2021-03-20 ENCOUNTER — Ambulatory Visit: Payer: 59 | Admitting: Endocrinology

## 2021-03-20 VITALS — BP 134/86 | HR 82 | Ht 63.0 in | Wt 247.6 lb

## 2021-03-20 DIAGNOSIS — E119 Type 2 diabetes mellitus without complications: Secondary | ICD-10-CM

## 2021-03-20 LAB — POCT GLYCOSYLATED HEMOGLOBIN (HGB A1C): Hemoglobin A1C: 7.3 % — AB (ref 4.0–5.6)

## 2021-03-20 LAB — POCT GLUCOSE (DEVICE FOR HOME USE): Glucose Fasting, POC: 90 mg/dL (ref 70–99)

## 2021-03-20 MED ORDER — FREESTYLE LITE TEST VI STRP: 1.0000 | ORAL_STRIP | Freq: Two times a day (BID) | 3 refills | Status: DC

## 2021-03-20 NOTE — Progress Notes (Signed)
Subjective:    Patient ID: Victoria Padilla, female    DOB: January 19, 1966, 56 y.o.   MRN: [id number]  HPI Pt returns for f/u of diabetes mellitus: DM type: Insulin-requiring type 2 Dx'ed: 0623 Complications: DR Therapy: insulin since 7628, Trulicity, and 2 oral meds.   GDM: 1998, but DM persisted after the pregnancy.   DKA: never Severe hypoglycemia: never Pancreatitis: never Pancreatic imaging: normal on 2017 [place].  Other: she declined multiple daily injections. chronic abd pain and nausea limit rx options.  she declines continuous glucose monitor; she also has thyroid pseudonodule  Interval history: pt says cbg varies from 90-190.  She reports "sour stomach."  Pt says she never misses meds.  She seldom has hypoglycemia, and these episodes are mild.   Past Medical History:  Diagnosis Date   Acid reflux    Anemia    Arthritis    Asthma    Cervical dysplasia    Diabetes mellitus    DVT (deep venous thrombosis) (HCC)    Gallbladder problem    GERD (gastroesophageal reflux disease)    Heart murmur    Hx of blood clots    Hyperlipidemia    Hypertension    LBP (low back pain)    Liver problem    Obesity    OSA (obstructive sleep apnea) 03/22/2018   New diagnosis. Mild.    Pulmonary embolism (HCC)    Sarcoidosis    Sleep apnea    Vitamin D deficiency     Past Surgical History:  Procedure Laterality Date   BREAST BIOPSY Left 2019   BUNIONECTOMY     CHOLECYSTECTOMY     COLPOSCOPY     GYNECOLOGIC CRYOSURGERY     KNEE ARTHROSCOPY     TUBAL LIGATION      Social History   Socioeconomic History   Marital status: Single    Spouse name: Not on file   Number of children: 3   Years of education: Not on file   Highest education level: Not on file  Occupational History   Occupation: Careers adviser: [employer]  Tobacco Use   Smoking status: Never   Smokeless tobacco: Never  Vaping Use   Vaping Use: Never used  Substance and Sexual Activity    Alcohol use: No   Drug use: No   Sexual activity: Not Currently    Birth control/protection: Surgical  Other Topics Concern   Not on file  Social History Narrative   Not on file   Social Determinants of Health   Financial Resource Strain: Not on file  Food Insecurity: Not on file  Transportation Needs: Not on file  Physical Activity: Not on file  Stress: Not on file  Social Connections: Not on file  Intimate Partner Violence: Not on file    Current Outpatient Medications on File Prior to Visit  Medication Sig Dispense Refill   amLODipine (NORVASC) 5 MG tablet TAKE 1 TABLET BY MOUTH EVERY DAY 90 tablet 0   B-D ULTRAFINE III SHORT PEN 31G X 8 MM MISC USE TO INJECT BASAGLAR DAILY- NEED APPT 100 each 0   benazepril (LOTENSIN) 40 MG tablet Take 1 tablet (40 mg total) by mouth daily. 90 tablet 0   BESIVANCE 0.6 % SUSP SMARTSIG:1 In Eye(s)     cetirizine (ZYRTEC) 10 MG tablet Take 10 mg by mouth daily.     cholecalciferol (VITAMIN D) 1000 units tablet Take 2,000 Units by mouth at bedtime.  clobetasol ointment (TEMOVATE) 0.05 % APPLY TOPICALLY TWICE A DAY X 4 WEEKS, THEN ONCE A DAY X 4 WEEKS, THEN TWICE A WEEK. 30 g 2   Continuous Blood Gluc Sensor (FREESTYLE LIBRE 2 SENSOR) MISC 1 Device by Does not apply route every 14 (fourteen) days. 6 each 3   diclofenac Sodium (VOLTAREN) 1 % GEL Apply 2 g topically 4 (four) times daily. 150 g 2   Dulaglutide (TRULICITY) 3 PF/7.9KW SOPN Inject 3 mg as directed once a week. 6 mL 3   FARXIGA 10 MG TABS tablet TAKE 1 TABLET BY MOUTH DAILY BEFORE BREAKFAST. 90 tablet 3   hydrochlorothiazide (HYDRODIURIL) 25 MG tablet TAKE 1 TABLET BY MOUTH EVERY DAY 90 tablet 3   Insulin Glargine (BASAGLAR KWIKPEN) 100 UNIT/ML Inject 30 Units into the skin every morning. 45 mL 3   metFORMIN (GLUCOPHAGE-XR) 500 MG 24 hr tablet TAKE 1 TABLET BY MOUTH TWICE A DAY 180 tablet 3   metoprolol tartrate (LOPRESSOR) 25 MG tablet Take 1 tablet (25 mg total) by mouth 2 (two)  times daily. 180 tablet 0   Omega-3 Fatty Acids (FISH OIL PO) Take 1 capsule by mouth at bedtime. Reported on 08/08/2015     omeprazole (PRILOSEC) 40 MG capsule TAKE 1 CAPSULE BY MOUTH EVERY DAY 30 capsule 0   ondansetron (ZOFRAN) 4 MG tablet Take 4 mg by mouth every 8 (eight) hours as needed for nausea or vomiting.     rivaroxaban (XARELTO) 10 MG TABS tablet Take 1 tablet (10 mg total) by mouth daily. Needs appointment for refills 30 tablet 0   simvastatin (ZOCOR) 20 MG tablet TAKE 1 TABLET BY MOUTH EVERYDAY AT BEDTIME 90 tablet 0   No current facility-administered medications on file prior to visit.    Allergies  Allergen Reactions   Naprosyn [Naproxen] Nausea Only    Family History  Problem Relation Age of Onset   Heart failure Mother 95       chf   Diabetes Mother    High blood pressure Mother    Heart disease Mother    Sudden death Mother    Kidney disease Mother    Obesity Mother    Heart failure Father    Heart disease Father    Sudden death Father    Alcoholism Father    Cancer Brother 45       prostate   Hypertension Maternal Uncle    Hyperlipidemia Brother    Diabetes Brother    Colon cancer Maternal Grandfather    Colon polyps Brother    Diabetes Brother    Hyperlipidemia Brother    Stomach cancer Neg Hx    Esophageal cancer Neg Hx    Rectal cancer Neg Hx    Liver cancer Neg Hx    Breast cancer Neg Hx     BP 134/86    Pulse 82    Ht 5\' 3"  (1.6 m)    Wt 247 lb 9.6 oz (112.3 kg)    SpO2 97%    BMI 43.86 kg/m    Review of Systems     Objective:   Physical Exam    Lab Results  Component Value Date   HGBA1C 7.3 (A) 03/20/2021      Assessment & Plan:  Insulin-requiring type 2 DM.  Dyspepsia, due to Trulicity.  we discussed.  She would like to continue, at least for now, due to weight loss Hypoglycemia, due to insulin: this limits aggressiveness of glycemic control  Patient Instructions  check your blood sugar twice a day: before the 3 meals, and  at bedtime.  also check if you have symptoms of your blood sugar being too high or too low.  please keep a record of the readings and bring it to your next appointment here (or you can bring the meter itself).  You can write it on any piece of paper.  please call us sooner if your blood sugar goes below 70, or if you have a lot of readings over 200.   Please continue the same 4 diabetes medications.    Please come back for a follow-up appointment in 3 months.

## 2021-03-20 NOTE — Patient Instructions (Addendum)
check your blood sugar twice a day: before the 3 meals, and at bedtime.  also check if you have symptoms of your blood sugar being too high or too low.  please keep a record of the readings and bring it to your next appointment here (or you can bring the meter itself).  You can write it on any piece of paper.  please call us sooner if your blood sugar goes below 70, or if you have a lot of readings over 200.   Please continue the same 4 diabetes medications.    Please come back for a follow-up appointment in 3 months.

## 2021-03-26 ENCOUNTER — Other Ambulatory Visit: Payer: Self-pay

## 2021-03-26 DIAGNOSIS — K219 Gastro-esophageal reflux disease without esophagitis: Secondary | ICD-10-CM

## 2021-03-26 MED ORDER — OMEPRAZOLE 40 MG PO CPDR
DELAYED_RELEASE_CAPSULE | ORAL | 2 refills | Status: DC
Start: 2021-03-26 — End: 2021-05-28

## 2021-03-27 ENCOUNTER — Other Ambulatory Visit: Payer: Self-pay | Admitting: Family Medicine

## 2021-04-04 ENCOUNTER — Other Ambulatory Visit: Payer: Self-pay

## 2021-04-04 DIAGNOSIS — I10 Essential (primary) hypertension: Secondary | ICD-10-CM

## 2021-04-04 MED ORDER — AMLODIPINE BESYLATE 5 MG PO TABS: 5.0000 mg | ORAL_TABLET | Freq: Every day | ORAL | 0 refills | Status: DC

## 2021-04-06 ENCOUNTER — Ambulatory Visit (HOSPITAL_COMMUNITY)
Admission: EM | Admit: 2021-04-06 | Discharge: 2021-04-06 | Disposition: A | Discharge status: Home / Self Care | Payer: 59 | Attending: Student | Admitting: Student

## 2021-04-06 ENCOUNTER — Other Ambulatory Visit: Payer: Self-pay

## 2021-04-06 ENCOUNTER — Encounter (HOSPITAL_COMMUNITY): Payer: Self-pay

## 2021-04-06 DIAGNOSIS — Z1152 Encounter for screening for COVID-19: Secondary | ICD-10-CM | POA: Diagnosis present

## 2021-04-06 DIAGNOSIS — Z7901 Long term (current) use of anticoagulants: Secondary | ICD-10-CM | POA: Diagnosis present

## 2021-04-06 DIAGNOSIS — Z86711 Personal history of pulmonary embolism: Secondary | ICD-10-CM | POA: Diagnosis present

## 2021-04-06 DIAGNOSIS — R0789 Other chest pain: Secondary | ICD-10-CM | POA: Insufficient documentation

## 2021-04-06 DIAGNOSIS — H66001 Acute suppurative otitis media without spontaneous rupture of ear drum, right ear: Secondary | ICD-10-CM | POA: Insufficient documentation

## 2021-04-06 LAB — SARS CORONAVIRUS 2 (TAT 6-24 HRS): SARS Coronavirus 2: POSITIVE — AB

## 2021-04-06 MED ORDER — AMOXICILLIN 875 MG PO TABS: 875.0000 mg | ORAL_TABLET | Freq: Two times a day (BID) | ORAL | 0 refills | Status: AC

## 2021-04-06 NOTE — ED Provider Notes (Signed)
[facility]    CSN: [id number] Arrival date & time: 04/06/21  1521      History   Chief Complaint Chief Complaint  Patient presents with   Nasal Congestion   Headache    HPI Victoria Padilla is a 56 y.o. female presenting with viral syndrome. Medical history DVT, PE, long-term anticoagulation, sarcoidosis, diabetes, hypertension, OSA.  States cough is nonproductive.  Nasal congestion.  Right ear pain, without hearing changes, dizziness, tinnitus.  States her sarcoidosis and asthma is controlled on albuterol inhaler, she has actually not required this during present illness.  States the chest pain is sharp, and she only feels it when she takes a deep breath or moves.  Denies chest pain at rest, denies left-sided chest pain, denies radiation of pain in arm or to her jaw, denies shortness of breath.  HPI  Past Medical History:  Diagnosis Date   Acid reflux    Anemia    Arthritis    Asthma    Cervical dysplasia    Diabetes mellitus    DVT (deep venous thrombosis) (HCC)    Gallbladder problem    GERD (gastroesophageal reflux disease)    Heart murmur    Hx of blood clots    Hyperlipidemia    Hypertension    LBP (low back pain)    Liver problem    Obesity    OSA (obstructive sleep apnea) 03/22/2018   New diagnosis. Mild.    Pulmonary embolism ([place])    Sarcoidosis    Sleep apnea    Vitamin D deficiency     Patient Active Problem List   Diagnosis Date Noted   Diabetes ([place]) 12/09/2020   Pulmonary embolism ([place]) 08/15/2018   Weight gain 08/03/2018   OSA (obstructive sleep apnea) 03/22/2018   Orthopnea 02/13/2018   Family history of heart disease in female family member before age 61 02/13/2018   GERD (gastroesophageal reflux disease) 12/21/2015   Personal history of noncompliance with medical treatment, presenting hazards to health 12/21/2015   Morbid obesity ([place]) 03/03/2015   History of anemia 03/03/2015   Hyperlipidemia associated with type 2 diabetes  mellitus ([place]) 03/03/2015   Benign essential HTN 03/03/2015   Arthritis    Cervical dysplasia    Sarcoidosis     Past Surgical History:  Procedure Laterality Date   BREAST BIOPSY Left 2019   BUNIONECTOMY     CHOLECYSTECTOMY     COLPOSCOPY     GYNECOLOGIC CRYOSURGERY     KNEE ARTHROSCOPY     TUBAL LIGATION      OB History     Gravida  3   Para  3   Term  3   Preterm      AB      Living  3      SAB      IAB      Ectopic      Multiple      Live Births               Home Medications    Prior to Admission medications   Medication Sig Start Date End Date Taking? Authorizing Provider  amoxicillin (AMOXIL) 875 MG tablet Take 1 tablet (875 mg total) by mouth 2 (two) times daily for 7 days. 04/06/21 04/13/21 Yes Hazel Sams, PA-C  amLODipine (NORVASC) 5 MG tablet Take 1 tablet (5 mg total) by mouth daily. 04/04/21   Denita Lung, MD  B-D ULTRAFINE III SHORT PEN 31G X  8 MM MISC USE TO INJECT BASAGLAR DAILY- NEED APPT 08/23/20   Renato Shin, MD  benazepril (LOTENSIN) 40 MG tablet Take 1 tablet (40 mg total) by mouth daily. 01/29/21   Denita Lung, MD  BESIVANCE 0.6 % SUSP SMARTSIG:1 In Eye(s) 11/28/19   [provider]  cetirizine (ZYRTEC) 10 MG tablet Take 10 mg by mouth daily.    [provider]  cholecalciferol (VITAMIN D) 1000 units tablet Take 2,000 Units by mouth at bedtime.     [provider]  clobetasol ointment (TEMOVATE) 0.05 % APPLY TOPICALLY TWICE A DAY X 4 WEEKS, THEN ONCE A DAY X 4 WEEKS, THEN TWICE A WEEK. 12/28/20   Marny Lowenstein A, NP  Continuous Blood Gluc Sensor (FREESTYLE LIBRE 2 SENSOR) MISC 1 Device by Does not apply route every 14 (fourteen) days. 12/16/19   Renato Shin, MD  diclofenac Sodium (VOLTAREN) 1 % GEL Apply 2 g topically 4 (four) times daily. 11/09/19   Aundra Dubin, PA-C  Dulaglutide (TRULICITY) 3 XL/2.4MW SOPN Inject 3 mg as directed once a week. 12/06/20   Renato Shin, MD  FARXIGA 10 MG  TABS tablet TAKE 1 TABLET BY MOUTH DAILY BEFORE BREAKFAST. 02/06/21   Renato Shin, MD  glucose blood (FREESTYLE LITE) test strip 1 each by Other route 2 (two) times daily. And lancets 2/day 03/20/21   Renato Shin, MD  hydrochlorothiazide (HYDRODIURIL) 25 MG tablet TAKE 1 TABLET BY MOUTH EVERY DAY 12/07/19   Nahser, Wonda Cheng, MD  Insulin Glargine [facility]) 100 UNIT/ML Inject 30 Units into the skin every morning. 12/06/20   Renato Shin, MD  metFORMIN (GLUCOPHAGE-XR) 500 MG 24 hr tablet TAKE 1 TABLET BY MOUTH TWICE A DAY 03/13/20   Renato Shin, MD  metoprolol tartrate (LOPRESSOR) 25 MG tablet Take 1 tablet (25 mg total) by mouth 2 (two) times daily. 02/14/21   Denita Lung, MD  Omega-3 Fatty Acids (FISH OIL PO) Take 1 capsule by mouth at bedtime. Reported on 08/08/2015    [provider]  omeprazole (PRILOSEC) 40 MG capsule TAKE 1 CAPSULE BY MOUTH EVERY DAY 03/26/21   Denita Lung, MD  ondansetron (ZOFRAN) 4 MG tablet Take 4 mg by mouth every 8 (eight) hours as needed for nausea or vomiting.    [provider]  simvastatin (ZOCOR) 20 MG tablet TAKE 1 TABLET BY MOUTH EVERYDAY AT BEDTIME 12/11/20   Denita Lung, MD  XARELTO 10 MG TABS tablet TAKE 1 TABLET (10 MG TOTAL) BY MOUTH DAILY. NEEDS APPOINTMENT FOR REFILLS 03/27/21   Irene Pap, PA-C    Family History Family History  Problem Relation Age of Onset   Heart failure Mother 62       chf   Diabetes Mother    High blood pressure Mother    Heart disease Mother    Sudden death Mother    Kidney disease Mother    Obesity Mother    Heart failure Father    Heart disease Father    Sudden death Father    Alcoholism Father    Cancer Brother 41       prostate   Hypertension Maternal Uncle    Hyperlipidemia Brother    Diabetes Brother    Colon cancer Maternal Grandfather    Colon polyps Brother    Diabetes Brother    Hyperlipidemia Brother    Stomach cancer Neg Hx    Esophageal cancer Neg Hx     Rectal cancer Neg Hx  Liver cancer Neg Hx    Breast cancer Neg Hx     Social History Social History   Tobacco Use   Smoking status: Never   Smokeless tobacco: Never  Vaping Use   Vaping Use: Never used  Substance Use Topics   Alcohol use: No   Drug use: No     Allergies   Naprosyn [naproxen]   Review of Systems Review of Systems  Constitutional:  Negative for appetite change, chills and fever.  HENT:  Positive for congestion and ear pain. Negative for rhinorrhea, sinus pressure, sinus pain and sore throat.   Eyes:  Negative for redness and visual disturbance.  Respiratory:  Positive for cough. Negative for chest tightness, shortness of breath and wheezing.   Cardiovascular:  Negative for chest pain and palpitations.  Gastrointestinal:  Negative for abdominal pain, constipation, diarrhea, nausea and vomiting.  Genitourinary:  Negative for dysuria, frequency and urgency.  Musculoskeletal:  Negative for myalgias.       Chest wall pain  Neurological:  Negative for dizziness, weakness and headaches.  Psychiatric/Behavioral:  Negative for confusion.   All other systems reviewed and are negative.   Physical Exam Triage Vital Signs ED Triage Vitals [04/06/21 1532]  Enc Vitals Group     BP 130/78     Pulse Rate 99     Resp 14     Temp 99.5 F (37.5 C)     Temp Source Oral     SpO2 100 %     Weight      Height      Head Circumference      Peak Flow      Pain Score      Pain Loc      Pain Edu?      Excl. in [place]?    No data found.  Updated Vital Signs BP 130/78 (BP Location: Right Arm)    Pulse 99    Temp 99.5 F (37.5 C) (Oral)    Resp 14    SpO2 100%   Visual Acuity Right Eye Distance:   Left Eye Distance:   Bilateral Distance:    Right Eye Near:   Left Eye Near:    Bilateral Near:     Physical Exam Vitals reviewed.  Constitutional:      Appearance: Normal appearance. She is not ill-appearing or diaphoretic.  HENT:     Head: Normocephalic and  atraumatic.     Right Ear: Hearing, ear canal and external ear normal. Tenderness present. No swelling. No middle ear effusion. There is no impacted cerumen. No mastoid tenderness. Tympanic membrane is erythematous and bulging. Tympanic membrane is not injected, scarred, perforated or retracted.     Left Ear: Hearing, tympanic membrane, ear canal and external ear normal. No swelling or tenderness.  No middle ear effusion. There is no impacted cerumen. No mastoid tenderness. Tympanic membrane is not injected, scarred, perforated, erythematous, retracted or bulging.     Mouth/Throat:     Mouth: Mucous membranes are moist.     Pharynx: Oropharynx is clear. No oropharyngeal exudate or posterior oropharyngeal erythema.  Eyes:     Extraocular Movements: Extraocular movements intact.     Pupils: Pupils are equal, round, and reactive to light.  Cardiovascular:     Rate and Rhythm: Normal rate and regular rhythm.     Pulses:          Radial pulses are 2+ on the right side and 2+ on the left side.  Heart sounds: Normal heart sounds.  Pulmonary:     Effort: Pulmonary effort is normal.     Breath sounds: Normal breath sounds. No decreased breath sounds, wheezing, rhonchi or rales.  Chest:     Chest wall: Tenderness present.     Comments: Sternum TTP  Abdominal:     Palpations: Abdomen is soft.     Tenderness: There is no abdominal tenderness. There is no guarding or rebound.  Musculoskeletal:     Right lower leg: No edema.     Left lower leg: No edema.  Lymphadenopathy:     Cervical: No cervical adenopathy.  Skin:    General: Skin is warm.     Capillary Refill: Capillary refill takes less than 2 seconds.  Neurological:     General: No focal deficit present.     Mental Status: She is alert and oriented to person, place, and time.  Psychiatric:        Mood and Affect: Mood normal.        Behavior: Behavior normal.        Thought Content: Thought content normal.        Judgment: Judgment  normal.     UC Treatments / Results  Labs (all labs ordered are listed, but only abnormal results are displayed) Labs Reviewed  SARS CORONAVIRUS 2 (TAT 6-24 HRS)    EKG   Radiology No results found.  Procedures Procedures (including critical care time)  Medications Ordered in UC Medications - No data to display  Initial Impression / Assessment and Plan / UC Course  I have reviewed the triage vital signs and the nursing notes.  Pertinent labs & imaging results that were available during my care of the patient were reviewed by me and considered in my medical decision making (see chart for details).     This patient is a very pleasant 56 y.o. year old female presenting with R AOM and chest wall pain. Afebrile, nontachy. Clear lung sounds, low suspicion for acute pneumonia or asthma exacerbation. Has not required albuterol inhaler at all during present illness; declines refills on this today. History PE, she takes xarelto daily as directed. "Chest pain" is reproducible today.  Covid PCR sent.  Amoxicillin sent for AOM. Albuterol inhaler prn.  ED return precautions discussed. Patient verbalizes understanding and agreement.   Final Clinical Impressions(s) / UC Diagnoses   Final diagnoses:  Non-recurrent acute suppurative otitis media of right ear without spontaneous rupture of tympanic membrane  Long term current use of anticoagulant therapy  Chest wall pain  Encounter for screening for COVID-19  History of pulmonary embolism     Discharge Instructions      -Start the antibiotic- Amoxicillin , 1 pill every 12 hours for 7 days.  You can take this with food like with breakfast and dinner. -Albuterol inhaler as needed for cough, wheezing, shortness of breath, 1 to 2 puffs every 6 hours as needed. -With a virus, you're typically contagious for 5-7 days, or as long as you're having fevers.  -Follow-up if symptoms getting worse: chest pain, shortness of breath, new/high  fevers, etc.      ED Prescriptions     Medication Sig Dispense Auth. Provider   amoxicillin (AMOXIL) 875 MG tablet Take 1 tablet (875 mg total) by mouth 2 (two) times daily for 7 days. 14 tablet Hazel Sams, PA-C      PDMP not reviewed this encounter.   Hazel Sams, PA-C 04/06/21 1631

## 2021-04-06 NOTE — Discharge Instructions (Addendum)
-  Start the antibiotic- Amoxicillin , 1 pill every 12 hours for 7 days.  You can take this with food like with breakfast and dinner. -Albuterol inhaler as needed for cough, wheezing, shortness of breath, 1 to 2 puffs every 6 hours as needed. -With a virus, you're typically contagious for 5-7 days, or as long as you're having fevers.  -Follow-up if symptoms getting worse: chest pain, shortness of breath, new/high fevers, etc.

## 2021-04-06 NOTE — ED Triage Notes (Signed)
Pt presents with congestion, coughing, chest discomfort that is intermittent, and headache that began Wednesday

## 2021-04-16 IMAGING — CR PORTABLE CHEST - 1 VIEW
1 series · 1 of 1 positions shown · non-contrast
Comparison: 02/13/2018

CLINICAL DATA: Syncope

EXAM:
PORTABLE CHEST 1 VIEW

[AP]
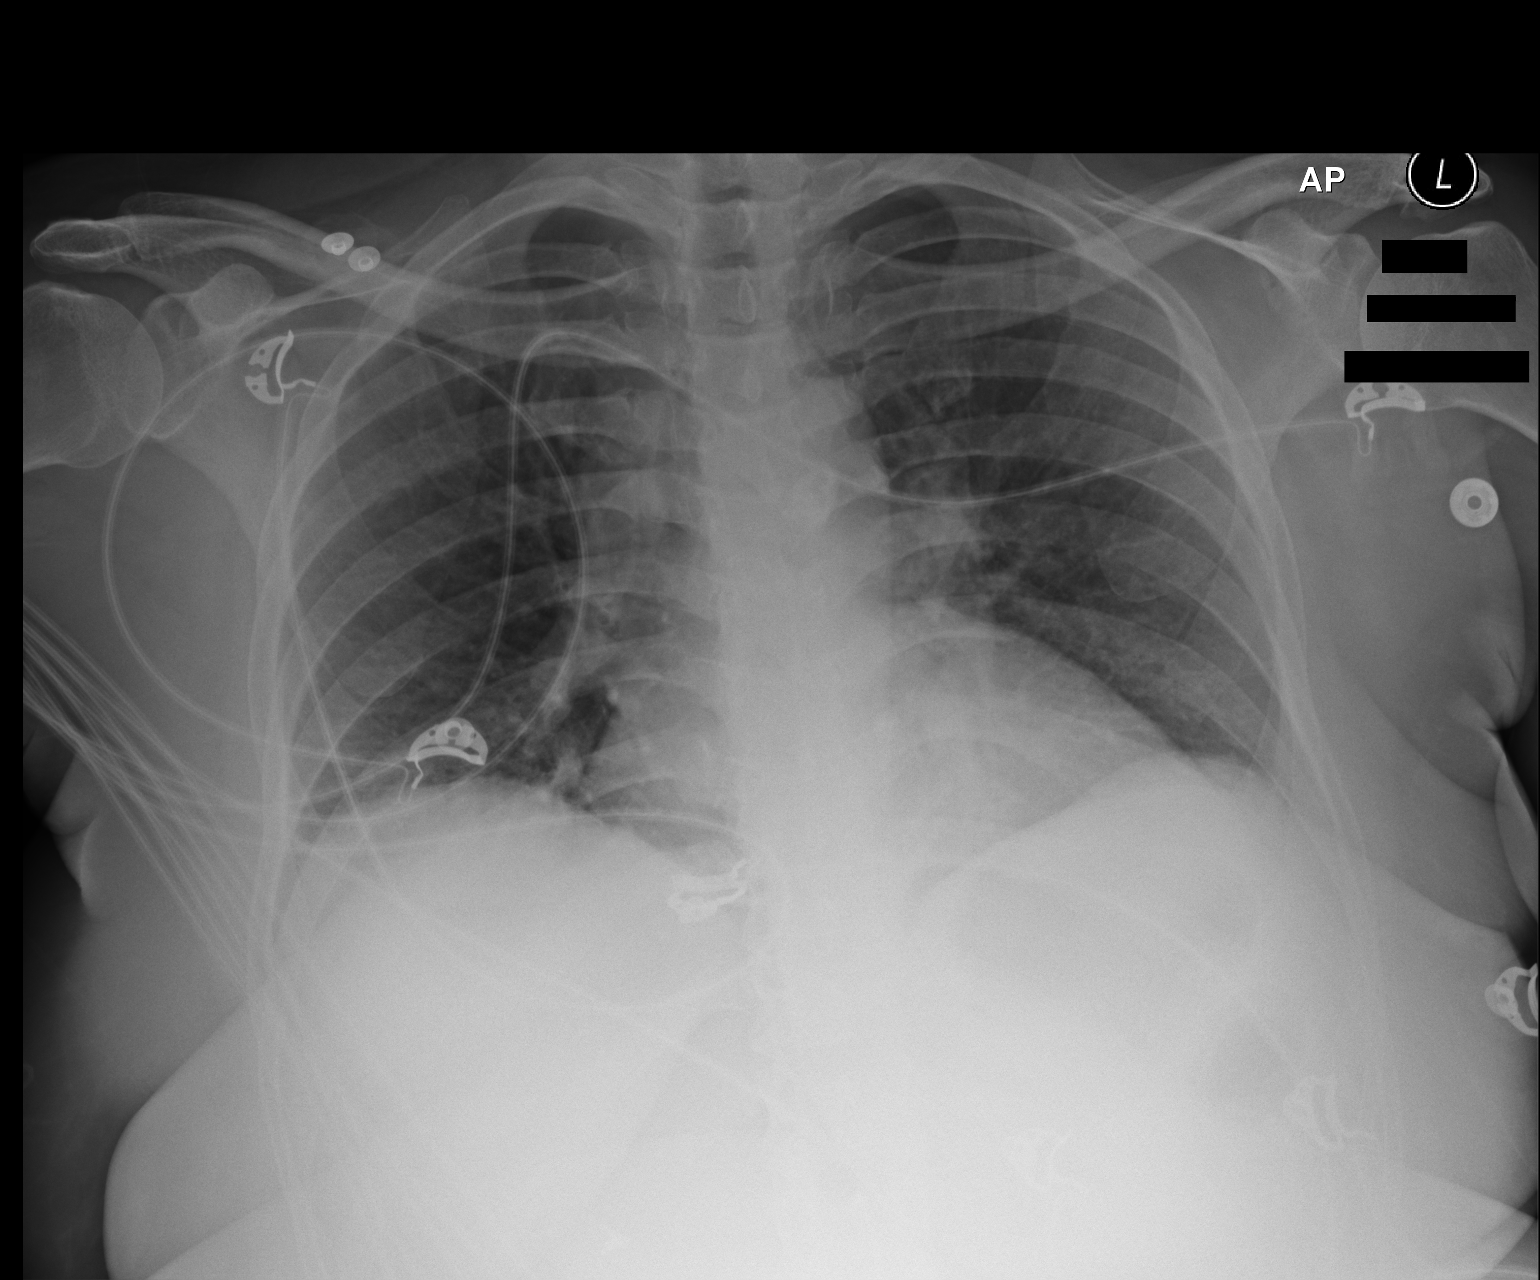

[1 of 1 positions shown; findings below may reference images not displayed]

FINDINGS: Mildly low lung volume. Borderline cardiomegaly. No focal
consolidation or effusion. No pneumothorax.
IMPRESSION: No active disease.  Low lung volumes.

## 2021-04-16 IMAGING — CT CT ANGIOGRAPHY CHEST
2 of 8 series · 17 of 46 positions shown · IV contrast (omnipaque)
Comparison: 12/15/2004; chest radiograph-earlier same day

CLINICAL DATA: Chest pain.  Evaluate for pulmonary embolism.

EXAM:
CT ANGIOGRAPHY CHEST WITH CONTRAST
TECHNIQUE: Multidetector CT imaging of the chest was performed using the
standard protocol during bolus administration of intravenous
contrast. Multiplanar CT image reconstructions and MIPs were
obtained to evaluate the vascular anatomy.
CONTRAST:  75mL OMNIPAQUE IOHEXOL 350 MG/ML SOLN

[Series 6: thins · axial · 0.76mm/px · z∈[+1120,+1335]mm · 14 of 237 slices shown]
[im 11/237  lung]
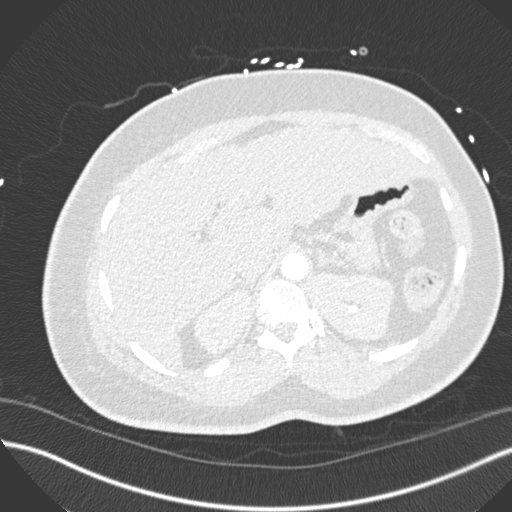
[im 33/237  soft-tissue]
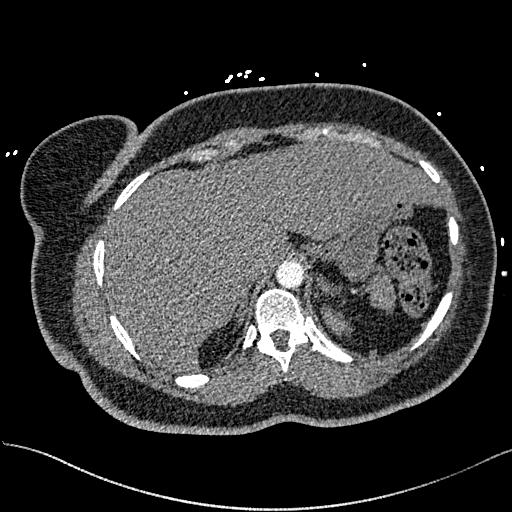
[im 43/237  lung]
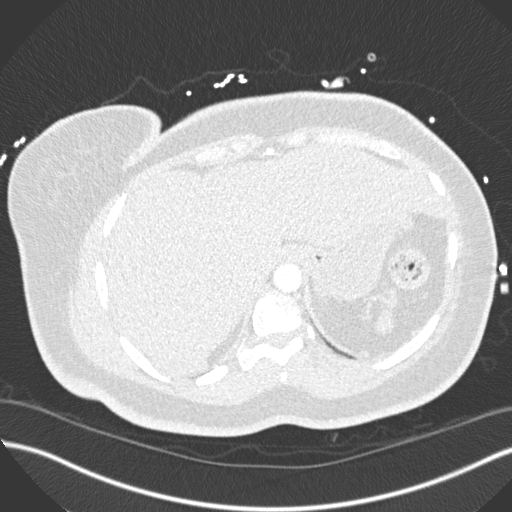
[im 65/237  soft-tissue]
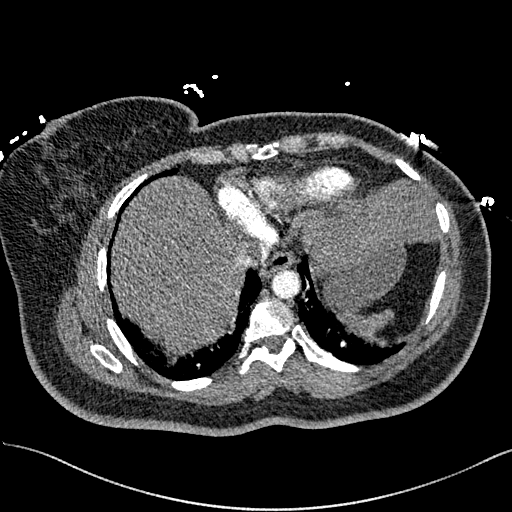
[im 76/237  lung]
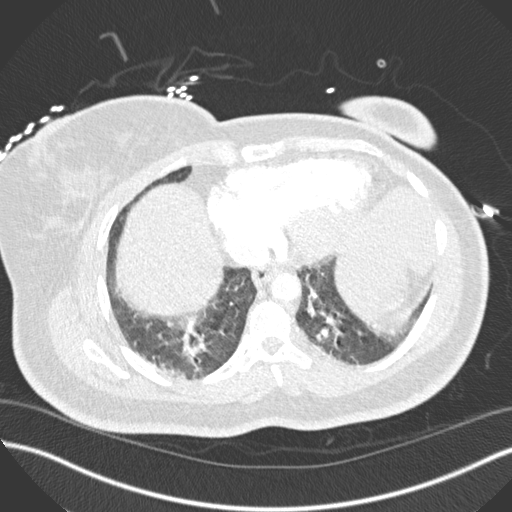
[im 97/237  soft-tissue]
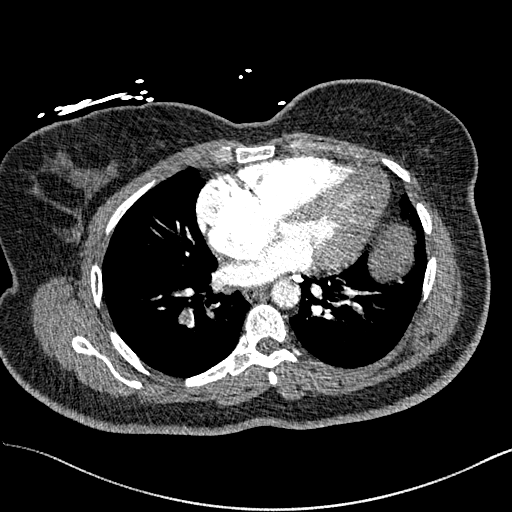
[im 108/237  lung]
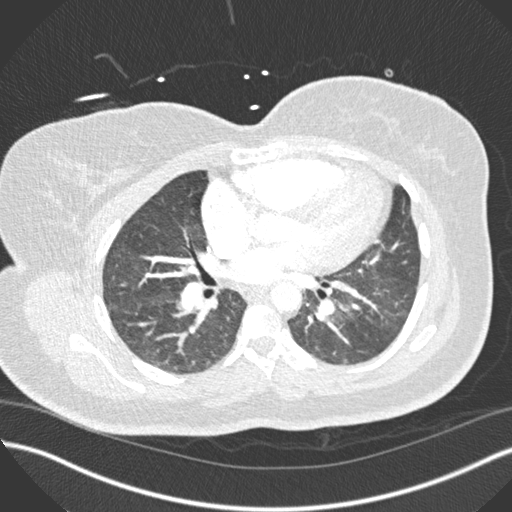
[im 129/237  soft-tissue]
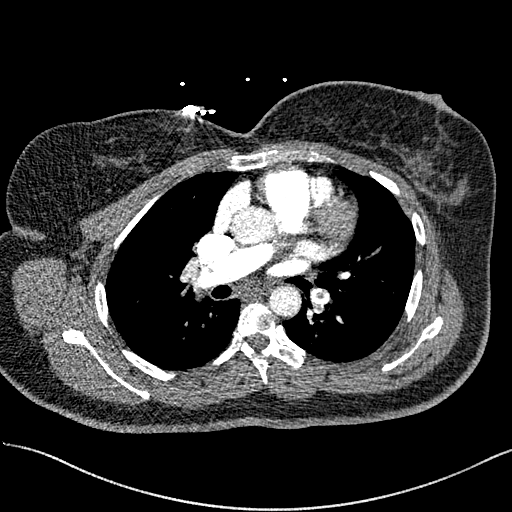
[im 140/237  lung]
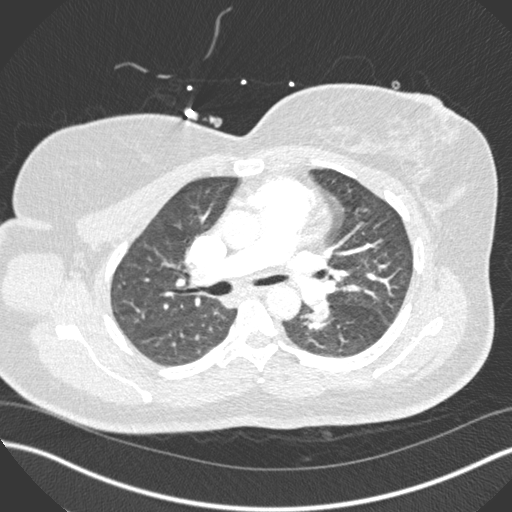
[im 161/237  soft-tissue]
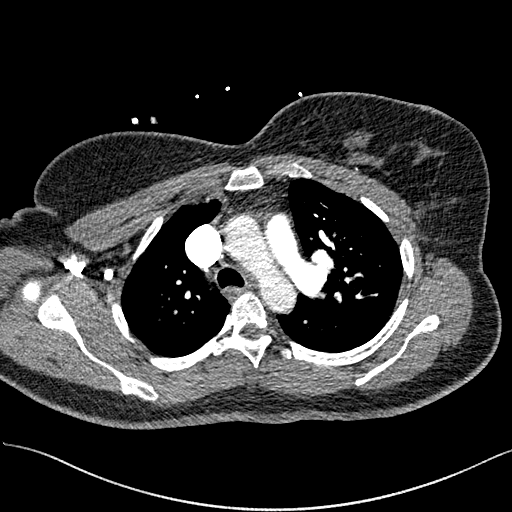
[im 172/237  lung]
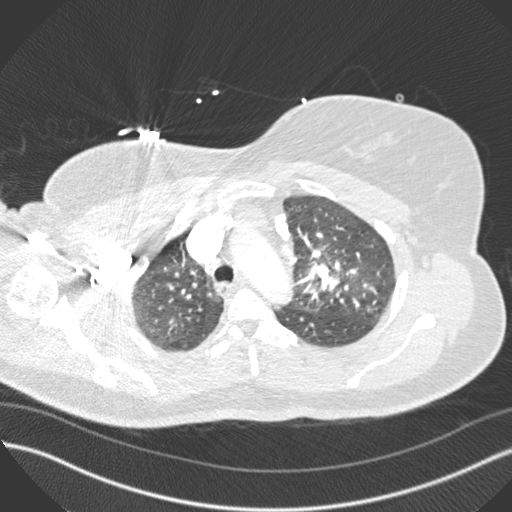
[im 194/237  soft-tissue]
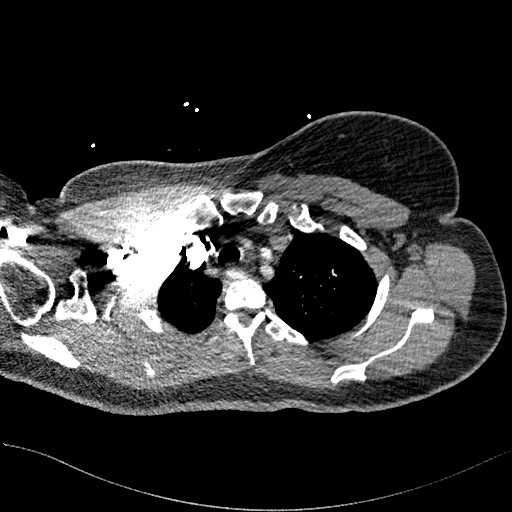
[im 204/237  lung]
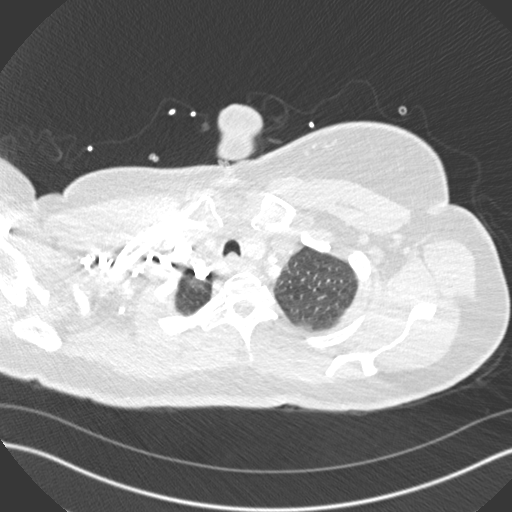
[im 226/237  soft-tissue]
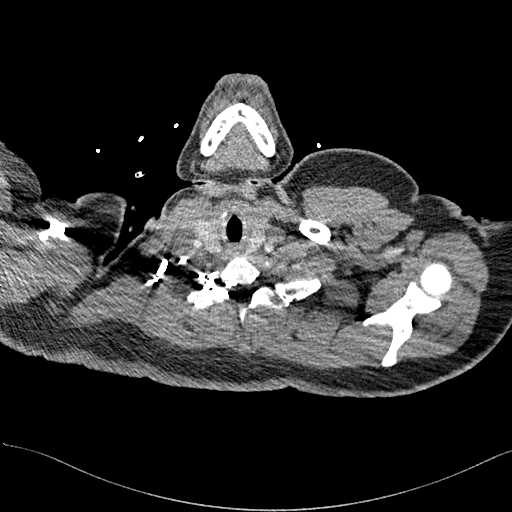

[Series 8: coronal mpr · coronal · 0.52mm/px · 3 of 151 slices shown]
[im 38/151  soft-tissue]
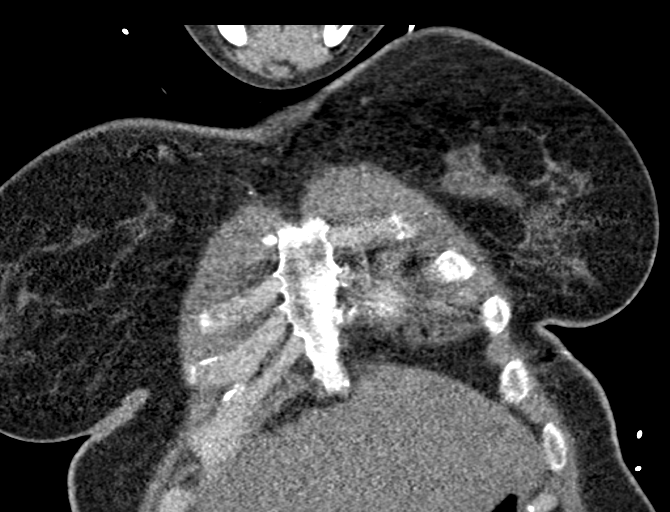
[im 76/151  soft-tissue]
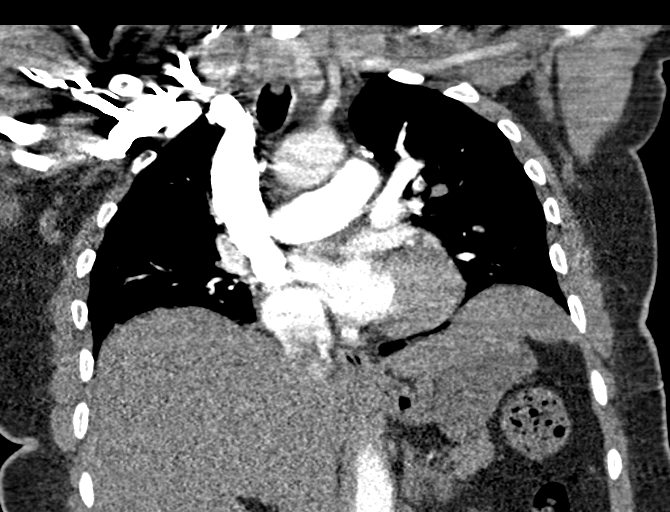
[im 113/151  soft-tissue]
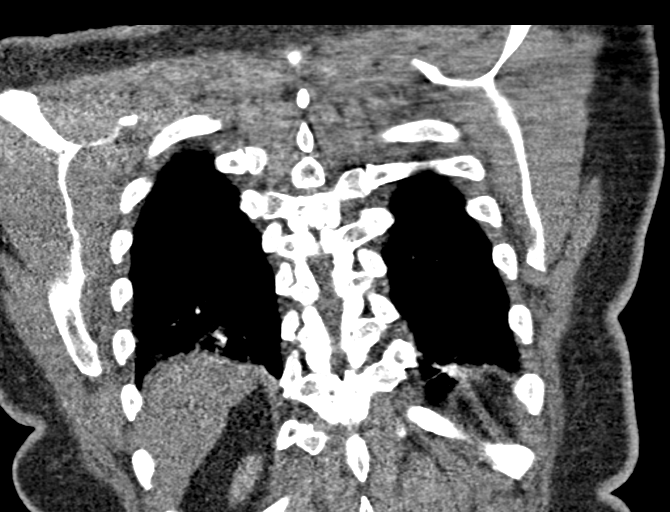

[17 of 46 positions shown; findings below may reference images not displayed]

FINDINGS: Vascular Findings:

There is adequate opacification of the pulmonary arterial system
with the main pulmonary artery measuring 410 Hounsfield units. Mixed
occlusive and nonocclusive filling defects are seen within segmental
and subsegmental pulmonary arteries bilaterally. The main and
majority of the right and left pulmonary arteries remain patent
without more central filling defect. Normal caliber the main
pulmonary artery.

While the overall clot burden is deemed moderate volume, there is
bowing of the intraventricular septum with abnormal right to left
ventricular ratio of 2.0 as could be seen in the setting right-sided
heart strain. No evidence of evolving pulmonary infarction

Cardiomegaly.  No pericardial effusion.

Normal caliber the thoracic aorta. Note is made of an aberrant right
subclavian artery which courses posterior to the trachea and the
esophagus.

Review of the MIP images confirms the above findings.

----------------------------------------------------------------------------------

Nonvascular Findings:

Mediastinum/Lymph Nodes: Scattered partially calcified mediastinal,
prevascular and bilateral hilar lymph nodes compatible with known
history of sarcoidosis. No bulky mediastinal, hilar axillary
lymphadenopathy.

Lungs/Pleura: Ill-defined ground-glass within the left upper lobe
(represent image 22, series 7), is favored to represent an area of
transient atelectasis given patient respiratory artifact. No
discrete focal airspace opacities. No pleural effusion or
pneumothorax. The central pulmonary airways appear widely patent.

No discrete pulmonary nodules given limitation of the examination.

Upper abdomen: Limited early arterial phase evaluation of the upper
abdomen demonstrates diffuse decreased attenuation of the hepatic
parenchyma with nodularity of the hepatic contour suggestive of
cirrhosis. The spleen is noted to be atretic.

Musculoskeletal: No acute or aggressive osseous abnormalities.
Regional soft tissues appear normal. Normal appearance of the
thyroid gland.
IMPRESSION: 1. Positive for acute bilateral moderate volume pulmonary embolism
with CT evidence of right heart strain (RV/LV Ratio = 2.0)
consistent with at least submassive (intermediate risk) PE. The
presence of right heart strain has been associated with an increased
risk of morbidity and mortality. Please activate Code PE by paging
[phone].
2. Decreased attenuation of the hepatic parenchyma with nodularity
hepatic contour compatible with hepatic cirrhosis, potentially
progressed compared to the [DATE] examination.
3. Partially calcified mediastinal and bilateral hilar lymph nodes
compatible provided history of sarcoidosis.
4. Incidentally noted aberrant right subclavian artery.

Critical Value/emergent results were called by telephone at the time
of interpretation on 08/15/2018 at [DATE] to Dr. Bawazier, who
verbally acknowledged these results.

## 2021-04-16 IMAGING — CT CT HEAD WITHOUT CONTRAST
4 series · 15 of 47 positions shown, 17 images · non-contrast
Comparison: None.

CLINICAL DATA: Witnessed syncopal episode today.

EXAM:
CT HEAD WITHOUT CONTRAST
TECHNIQUE: Contiguous axial images were obtained from the base of the skull
through the vertex without intravenous contrast.

[Series 3: head without · axial · non-contrast · 0.48mm/px · z∈[-123,+2]mm · 7 of 35 slices shown, 9 images]
[im 5/35  brain]
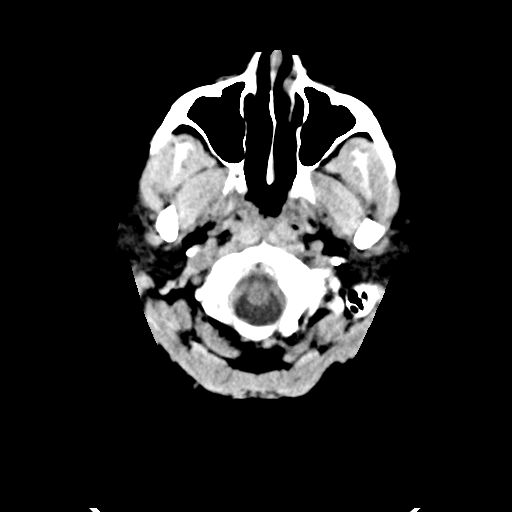
[im 5/35  bone]
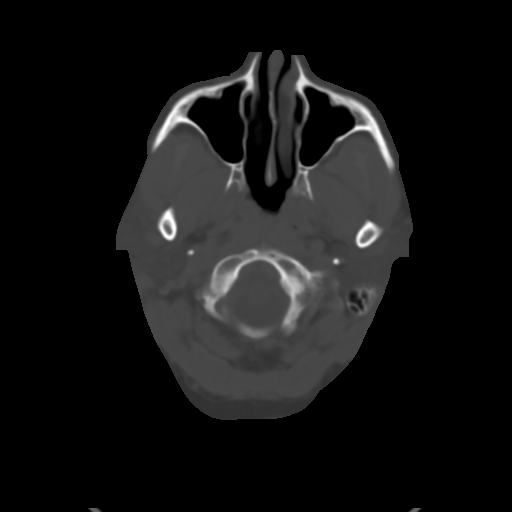
[im 9/35  brain]
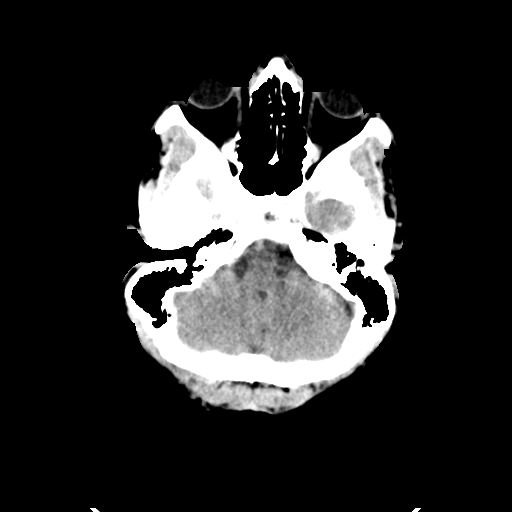
[im 13/35  brain]
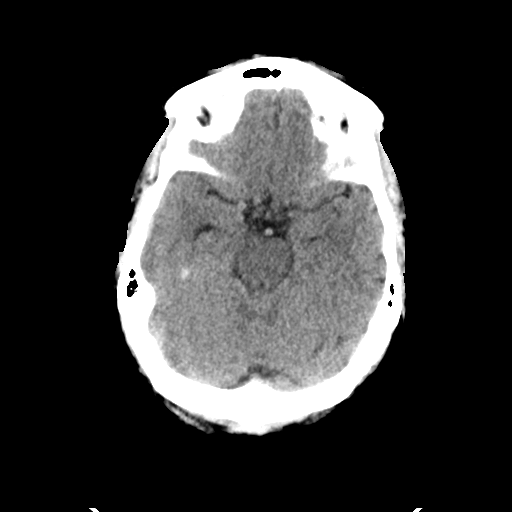
[im 18/35  brain]
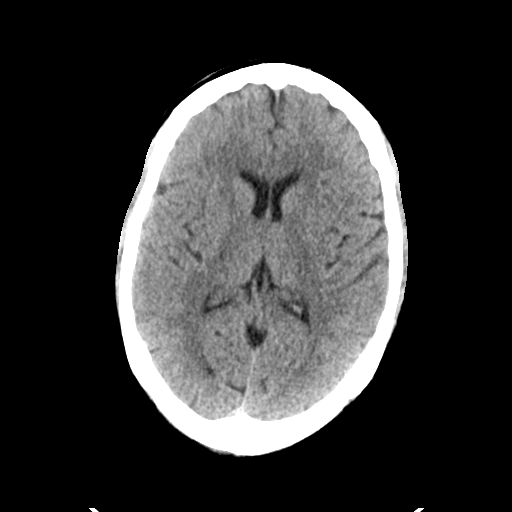
[im 22/35  brain]
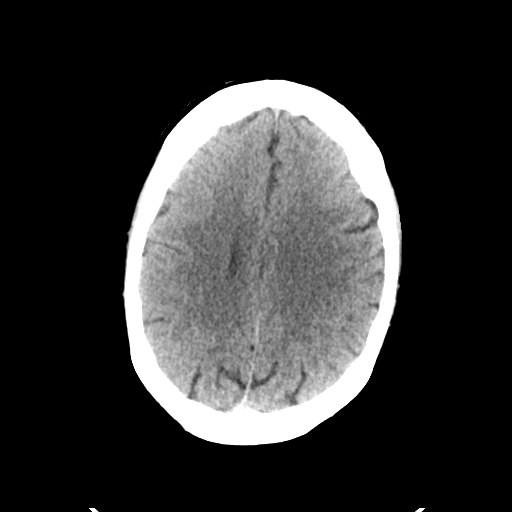
[im 22/35  bone]
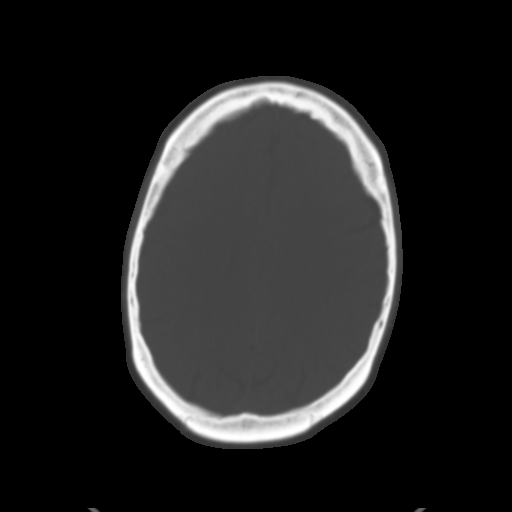
[im 26/35  brain]
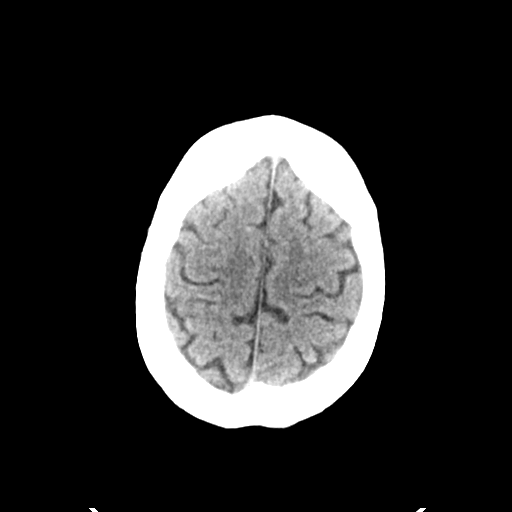
[im 30/35  brain]
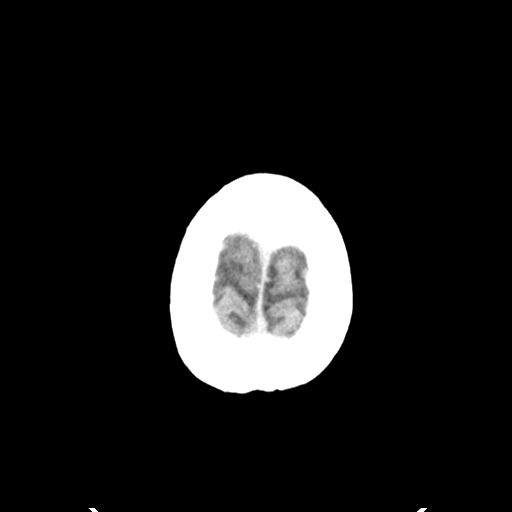

[Series 4: head bone · axial · 0.48mm/px · z∈[-127,-109]mm · 2 of 87 slices shown]
[im 9/87  bone]
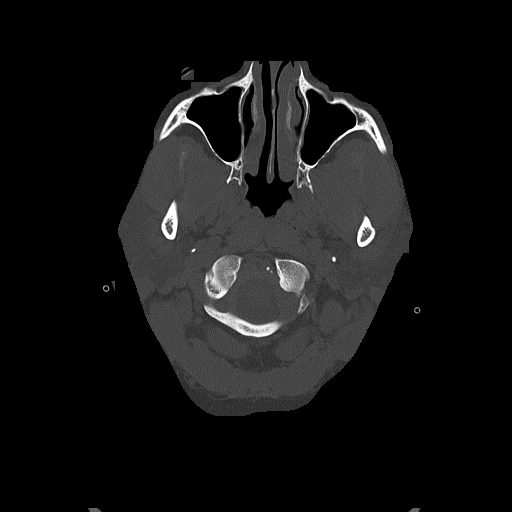
[im 18/87  bone]
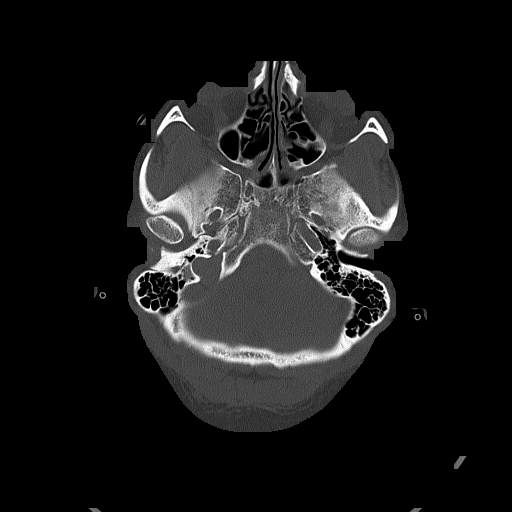

[Series 5: head without cor · coronal · non-contrast · 0.34mm/px · 3 of 67 slices shown]
[im 23/67  brain]
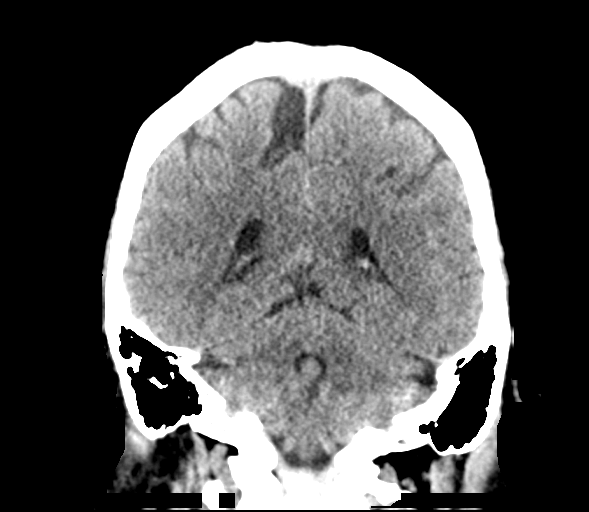
[im 30/67  brain]
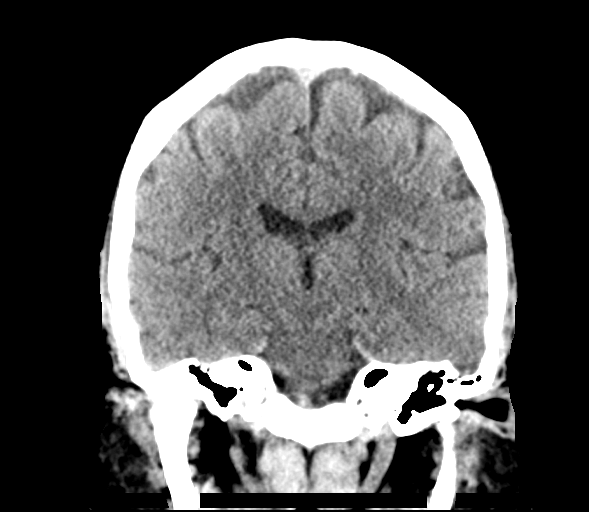
[im 37/67  brain]
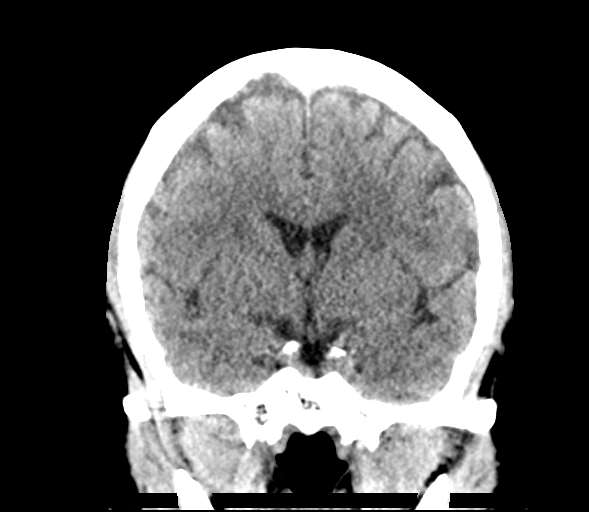

[Series 6: head without sag · sagittal · non-contrast · 0.34mm/px · 3 of 62 slices shown]
[im 21/62  brain]
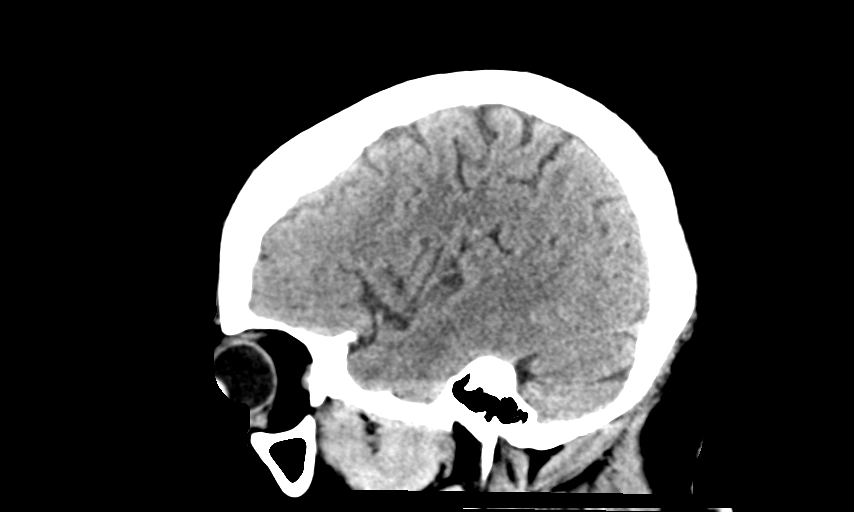
[im 31/62  brain]
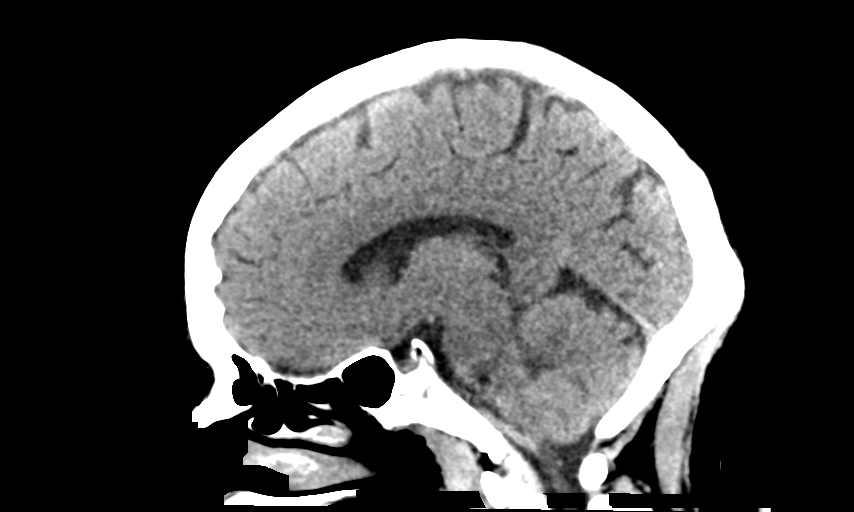
[im 41/62  brain]
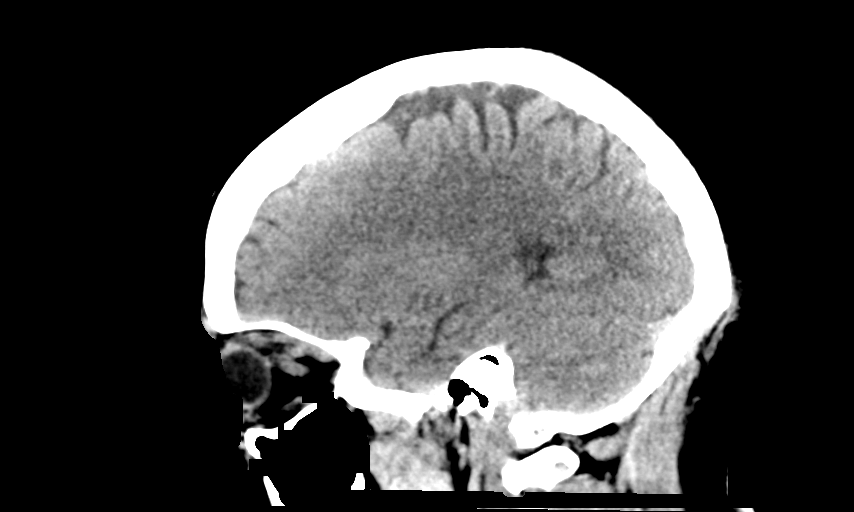

[15 of 47 positions shown; findings below may reference images not displayed]

FINDINGS: Brain: Ventricles, cisterns and other CSF spaces are normal. There
is no mass, mass effect, shift of midline structures or acute
hemorrhage. No evidence of acute infarction.

Vascular: No hyperdense vessel or unexpected calcification.

Skull: Normal. Negative for fracture or focal lesion.

Sinuses/Orbits: No acute finding.

Other: None.
IMPRESSION: No acute findings.

## 2021-05-26 ENCOUNTER — Other Ambulatory Visit: Payer: Self-pay | Admitting: Family Medicine

## 2021-05-26 DIAGNOSIS — K219 Gastro-esophageal reflux disease without esophagitis: Secondary | ICD-10-CM

## 2021-06-05 ENCOUNTER — Other Ambulatory Visit: Payer: Self-pay | Admitting: Cardiovascular Disease

## 2021-06-05 ENCOUNTER — Other Ambulatory Visit: Payer: Self-pay | Admitting: Physician Assistant

## 2021-06-07 MED ORDER — RIVAROXABAN 10 MG PO TABS: ORAL_TABLET | ORAL | 3 refills | Status: DC

## 2021-06-07 NOTE — Addendum Note (Signed)
Addended by: Denita Lung on: 06/07/2021 08:34 AM ? ? Modules accepted: Orders ? ?

## 2021-06-08 ENCOUNTER — Other Ambulatory Visit: Payer: Self-pay | Admitting: Family Medicine

## 2021-06-08 DIAGNOSIS — E1159 Type 2 diabetes mellitus with other circulatory complications: Secondary | ICD-10-CM

## 2021-06-19 ENCOUNTER — Ambulatory Visit: Payer: 59 | Admitting: Endocrinology

## 2021-07-02 ENCOUNTER — Other Ambulatory Visit: Payer: Self-pay | Admitting: Family Medicine

## 2021-07-02 DIAGNOSIS — I10 Essential (primary) hypertension: Secondary | ICD-10-CM

## 2021-08-07 ENCOUNTER — Encounter: Payer: Self-pay | Admitting: Family Medicine

## 2021-08-07 ENCOUNTER — Ambulatory Visit: Payer: 59 | Admitting: Family Medicine

## 2021-08-07 VITALS — BP 180/100 | HR 85 | Temp 97.5°F | Wt 253.2 lb

## 2021-08-07 DIAGNOSIS — I10 Essential (primary) hypertension: Secondary | ICD-10-CM | POA: Diagnosis not present

## 2021-08-07 DIAGNOSIS — Z86711 Personal history of pulmonary embolism: Secondary | ICD-10-CM

## 2021-08-07 DIAGNOSIS — I152 Hypertension secondary to endocrine disorders: Secondary | ICD-10-CM

## 2021-08-07 DIAGNOSIS — E11319 Type 2 diabetes mellitus with unspecified diabetic retinopathy without macular edema: Secondary | ICD-10-CM

## 2021-08-07 DIAGNOSIS — E1159 Type 2 diabetes mellitus with other circulatory complications: Secondary | ICD-10-CM

## 2021-08-07 DIAGNOSIS — Z91199 Patient's noncompliance with other medical treatment and regimen due to unspecified reason: Secondary | ICD-10-CM

## 2021-08-07 DIAGNOSIS — Z794 Long term (current) use of insulin: Secondary | ICD-10-CM

## 2021-08-07 NOTE — Progress Notes (Signed)
   Subjective:    Patient ID: Victoria Padilla, female    DOB: [birth year], 56 y.o.   MRN: [id number]  HPI She is here for consult concerning pedal edema.  She states that normally the edema goes away in the morning when she gets up and then will come back however 2 weeks ago the edema came and stay.  She then mentioned that she stopped all of her medications trying to do a cleansing.  She also stopped the Xarelto as well as her other medications.  Presently she is not having any chest pain, shortness of breath, PND or DOE.   Review of Systems     Objective:   Physical Exam Alert and in no distress.  Cardiac exam shows regular rhythm without murmurs or gallops.  Lungs clear to auscultation.  Both lower extremities do have 2-3+ pitting edema       Assessment & Plan:  Uncontrolled hypertension  Hypertension associated with diabetes ([place])  Morbid obesity ([place])  Essential hypertension  History of pulmonary embolus (PE)  Personal history of noncompliance with medical treatment, presenting hazards to health  Type 2 diabetes mellitus with retinopathy, with long-term current use of insulin, macular edema presence unspecified, unspecified laterality, unspecified retinopathy severity ([place]) I explained that this was not a good idea to stop all of her medications and put her at great risk for multiple issues.  I then discussed the fact that 1 way to possibly eliminate a lot of her issues would be for her to make changes in her diet and exercise in order to lose weight.  Explained that OSA, diabetes, blood pressure etc. are all interconnected.  She will start back on her medications and then get routine follow-up.

## 2021-09-16 ENCOUNTER — Other Ambulatory Visit: Payer: Self-pay | Admitting: Family Medicine

## 2021-09-16 DIAGNOSIS — E1159 Type 2 diabetes mellitus with other circulatory complications: Secondary | ICD-10-CM

## 2021-09-17 NOTE — Telephone Encounter (Signed)
Sent my chart message for appointment. [place]

## 2021-10-03 ENCOUNTER — Encounter (INDEPENDENT_AMBULATORY_CARE_PROVIDER_SITE_OTHER): Payer: Self-pay

## 2021-10-31 ENCOUNTER — Encounter: Payer: Self-pay | Admitting: Internal Medicine

## 2021-11-22 ENCOUNTER — Other Ambulatory Visit (INDEPENDENT_AMBULATORY_CARE_PROVIDER_SITE_OTHER): Payer: 59

## 2021-11-22 ENCOUNTER — Other Ambulatory Visit: Payer: Self-pay | Admitting: Endocrinology

## 2021-11-22 DIAGNOSIS — E11319 Type 2 diabetes mellitus with unspecified diabetic retinopathy without macular edema: Secondary | ICD-10-CM | POA: Diagnosis not present

## 2021-11-22 DIAGNOSIS — Z794 Long term (current) use of insulin: Secondary | ICD-10-CM

## 2021-11-22 LAB — MICROALBUMIN / CREATININE URINE RATIO
Creatinine,U: 80.8 mg/dL
Microalb Creat Ratio: 124.1 mg/g — ABNORMAL HIGH (ref 0.0–30.0)
Microalb, Ur: 100.2 mg/dL — ABNORMAL HIGH (ref 0.0–1.9)

## 2021-11-23 LAB — LIPID PANEL
Cholesterol: 198 mg/dL (ref 0–200)
HDL: 59.6 mg/dL (ref >39.00)
LDL Cholesterol: 118 mg/dL — ABNORMAL HIGH (ref 0–99)
NonHDL: 138.59
Total CHOL/HDL Ratio: 3
Triglycerides: 103 mg/dL (ref 0.0–149.0)
VLDL: 20.6 mg/dL (ref 0.0–40.0)

## 2021-11-23 LAB — COMPREHENSIVE METABOLIC PANEL
ALT: 28 U/L (ref 0–35)
AST: 29 U/L (ref 0–37)
Albumin: 3.8 g/dL (ref 3.5–5.2)
Alkaline Phosphatase: 103 U/L (ref 39–117)
BUN: 12 mg/dL (ref 6–23)
CO2: 28 mEq/L (ref 19–32)
Calcium: 9.3 mg/dL (ref 8.4–10.5)
Chloride: 102 mEq/L (ref 96–112)
Creatinine, Ser: 0.7 mg/dL (ref 0.40–1.20)
GFR: 95.29 mL/min (ref >60.00)
Glucose, Bld: 175 mg/dL — ABNORMAL HIGH (ref 70–99)
Potassium: 4.2 mEq/L (ref 3.5–5.1)
Sodium: 137 mEq/L (ref 135–145)
Total Bilirubin: 0.4 mg/dL (ref 0.2–1.2)
Total Protein: 7.6 g/dL (ref 6.0–8.3)

## 2021-11-23 LAB — HEMOGLOBIN A1C: Hgb A1c MFr Bld: 10.4 % — ABNORMAL HIGH (ref 4.6–6.5)

## 2021-11-27 ENCOUNTER — Ambulatory Visit: Payer: 59 | Admitting: Endocrinology

## 2021-11-27 ENCOUNTER — Encounter: Payer: Self-pay | Admitting: Endocrinology

## 2021-11-27 VITALS — BP 152/98 | HR 89 | Ht 63.0 in | Wt 252.6 lb

## 2021-11-27 DIAGNOSIS — E1159 Type 2 diabetes mellitus with other circulatory complications: Secondary | ICD-10-CM | POA: Diagnosis not present

## 2021-11-27 DIAGNOSIS — Z23 Encounter for immunization: Secondary | ICD-10-CM

## 2021-11-27 DIAGNOSIS — I10 Essential (primary) hypertension: Secondary | ICD-10-CM | POA: Diagnosis not present

## 2021-11-27 DIAGNOSIS — E785 Hyperlipidemia, unspecified: Secondary | ICD-10-CM

## 2021-11-27 DIAGNOSIS — E1169 Type 2 diabetes mellitus with other specified complication: Secondary | ICD-10-CM

## 2021-11-27 DIAGNOSIS — R600 Localized edema: Secondary | ICD-10-CM

## 2021-11-27 DIAGNOSIS — E1165 Type 2 diabetes mellitus with hyperglycemia: Secondary | ICD-10-CM

## 2021-11-27 DIAGNOSIS — I152 Hypertension secondary to endocrine disorders: Secondary | ICD-10-CM

## 2021-11-27 DIAGNOSIS — Z794 Long term (current) use of insulin: Secondary | ICD-10-CM

## 2021-11-27 MED ORDER — XIGDUO XR 5-1000 MG PO TB24: 2.0000 | ORAL_TABLET | Freq: Every day | ORAL | 2 refills | Status: DC

## 2021-11-27 MED ORDER — BENAZEPRIL HCL 40 MG PO TABS: 40.0000 mg | ORAL_TABLET | Freq: Every day | ORAL | 1 refills | Status: DC

## 2021-11-27 MED ORDER — SIMVASTATIN 20 MG PO TABS: ORAL_TABLET | ORAL | 1 refills | Status: DC

## 2021-11-27 MED ORDER — HYDROCHLOROTHIAZIDE 25 MG PO TABS: 25.0000 mg | ORAL_TABLET | Freq: Every day | ORAL | 1 refills | Status: DC

## 2021-11-27 MED ORDER — TIRZEPATIDE 5 MG/0.5ML ~~LOC~~ SOAJ: 5.0000 mg | SUBCUTANEOUS | 1 refills | Status: DC

## 2021-11-27 MED ORDER — AMLODIPINE BESYLATE 5 MG PO TABS: 5.0000 mg | ORAL_TABLET | Freq: Every day | ORAL | 1 refills | Status: DC

## 2021-11-27 MED ORDER — TIRZEPATIDE 2.5 MG/0.5ML ~~LOC~~ SOAJ: 2.5000 mg | SUBCUTANEOUS | 0 refills | Status: DC

## 2021-11-27 NOTE — Patient Instructions (Addendum)
Check blood sugars on waking up 3-4 days a week  Also check blood sugars about 2 hours after meals and do this after different meals by rotation  Recommended blood sugar levels on waking up are 90-130 and about 2 hours after meal is 130-180  Please bring your blood sugar monitor to each visit, thank you    Merleen Nicely will replace Farxiga and metformin, start with 1 tablet daily with food for 1 week and then 2 tablets daily either together or separately  If your morning sugar continues to stay more than 150 increase the insulin by 5 units Otherwise if blood sugars are below 100 in the morning may reduce Basaglar by 5 units  Mounjaro 2.5 mg weekly for 4 injections and then 5 mg weekly

## 2021-11-27 NOTE — Progress Notes (Signed)
Patient ID: Victoria Padilla, female   DOB: 11/06/1965, 56 y.o.   MRN: [id number]           Reason for Appointment: Type II Diabetes follow-up   History of Present Illness   Diagnosis date: 1998  Previous history:  Non-insulin hypoglycemic drugs previously used: Jardiance, glipizide, Trulicity, Farxiga, metformin, Januvia Insulin was started in 2019  A1c range in the last few years is: 7.3-11.1 since 2019  Recent history:     Non-insulin hypoglycemic drugs: Metformin ER 500 mg twice daily, Trulicity 3 mg weekly, Farxiga 10 mg daily     Insulin regimen: Basaglar, previously on 30 units at night daily        Side effects from medications: None  Current self management, blood sugar patterns and problems identified:  A1c is 10.4 compared to 7.3 on 1/23 For the last few weeks she has not taking any of her diabetes medicines and at least for a month no insulin She says she was trying herbal remedies to see if she can get off all medications However her blood sugars are much higher than before Unlikely that she is checking her sugars regularly as she thinks her blood sugars are at the most about 200 also after eating in the evening and mostly around 100 in the morning; she cannot remember what her blood sugars are this week When she was taking all her medications she thinks her blood sugars were generally about 150 after eating and not over 190 Lab glucose was 175 mid afternoon She is not following any particular diet, previously has seen a dietitian Has difficulty losing weight and since January weight has gone up 5 pounds   Exercise: Walking at home and yard work Diet management: No particular meal plan     Hypoglycemia:  none     Glucometer:?  Freestyle lite  Blood Glucose readings from recall:  PRE-MEAL Fasting Lunch Dinner Bedtime Overall  Glucose range: 100 ?      Mean/median:        POST-MEAL PC Breakfast PC Lunch PC Dinner  Glucose range:   200  Mean/median:        Dietician visit: Most recent: 2019     Weight control:  Wt Readings from Last 3 Encounters:  11/27/21 252 lb 9.6 oz (114.6 kg)  08/07/21 253 lb 3.2 oz (114.9 kg)  03/20/21 247 lb 9.6 oz (112.3 kg)            Diabetes labs:  Lab Results  Component Value Date   HGBA1C 10.4 (H) 11/22/2021   HGBA1C 7.3 (A) 03/20/2021   HGBA1C 7.3 (A) 12/06/2020   Lab Results  Component Value Date   MICROALBUR 100.2 (H) 11/22/2021   LDLCALC 118 (H) 11/22/2021   CREATININE 0.70 11/22/2021    Lab Results  Component Value Date   FRUCTOSAMINE 374 (H) 11/04/2018   FRUCTOSAMINE 310 (H) 09/02/2018     Allergies as of 11/27/2021       Reactions   Naprosyn [naproxen] Nausea Only        Medication List        Accurate as of November 27, 2021 11:48 AM. If you have any questions, ask your nurse or doctor.          STOP taking these medications    Farxiga 10 MG Tabs tablet Generic drug: dapagliflozin propanediol Stopped by: Elayne Snare, MD   metFORMIN 500 MG 24 hr tablet Commonly known as: GLUCOPHAGE-XR Stopped by: Elayne Snare, MD  Trulicity 3 IW/5.8KD Sopn Generic drug: Dulaglutide Stopped by: Elayne Snare, MD       TAKE these medications    amLODipine 5 MG tablet Commonly known as: NORVASC Take 1 tablet (5 mg total) by mouth daily.   B-D ULTRAFINE III SHORT PEN 31G X 8 MM Misc Generic drug: Insulin Pen Needle USE TO INJECT BASAGLAR DAILY- NEED APPT   Basaglar KwikPen 100 UNIT/ML Inject 30 Units into the skin every morning.   benazepril 40 MG tablet Commonly known as: LOTENSIN Take 1 tablet (40 mg total) by mouth daily.   Besivance 0.6 % Susp Generic drug: Besifloxacin HCl   cetirizine 10 MG tablet Commonly known as: ZYRTEC Take 10 mg by mouth daily.   cholecalciferol 1000 units tablet Commonly known as: VITAMIN D Take 2,000 Units by mouth at bedtime.   clobetasol ointment 0.05 % Commonly known as: TEMOVATE APPLY TOPICALLY TWICE A DAY X 4 WEEKS, THEN  ONCE A DAY X 4 WEEKS, THEN TWICE A WEEK.   diclofenac Sodium 1 % Gel Commonly known as: Voltaren Apply 2 g topically 4 (four) times daily.   FISH OIL PO Take 1 capsule by mouth at bedtime. Reported on 08/08/2015   FreeStyle Libre 2 Sensor Misc 1 Device by Does not apply route every 14 (fourteen) days.   FREESTYLE LITE test strip Generic drug: glucose blood 1 each by Other route 2 (two) times daily. And lancets 2/day   hydrochlorothiazide 25 MG tablet Commonly known as: HYDRODIURIL Take 1 tablet (25 mg total) by mouth daily. Please make overdue appt with Dr. Acie Fredrickson before anymore refills. Thank you 1st attempt   metoprolol tartrate 25 MG tablet Commonly known as: LOPRESSOR Take 1 tablet (25 mg total) by mouth 2 (two) times daily.   omeprazole 40 MG capsule Commonly known as: PRILOSEC TAKE 1 CAPSULE BY MOUTH EVERY DAY   ondansetron 4 MG tablet Commonly known as: ZOFRAN Take 4 mg by mouth every 8 (eight) hours as needed for nausea or vomiting.   rivaroxaban 10 MG Tabs tablet Commonly known as: Xarelto TAKE 1 TABLET (10 MG TOTAL) BY MOUTH DAILY. NEEDS APPOINTMENT FOR REFILLS   simvastatin 20 MG tablet Commonly known as: ZOCOR TAKE 1 TABLET BY MOUTH EVERYDAY AT BEDTIME   tirzepatide 2.5 MG/0.5ML Pen Commonly known as: MOUNJARO Inject 2.5 mg into the skin once a week. Started by: Elayne Snare, MD   tirzepatide 5 MG/0.5ML Pen Commonly known as: MOUNJARO Inject 5 mg into the skin once a week. Started by: Elayne Snare, MD   Xigduo XR 06-998 MG Tb24 Generic drug: Dapagliflozin-metFORMIN HCl ER Take 2 tablets by mouth daily. Started by: Elayne Snare, MD        Allergies:  Allergies  Allergen Reactions   Naprosyn [Naproxen] Nausea Only    Past Medical History:  Diagnosis Date   Acid reflux    Anemia    Arthritis    Asthma    Cervical dysplasia    Diabetes mellitus    DVT (deep venous thrombosis) (HCC)    Gallbladder problem    GERD (gastroesophageal reflux  disease)    Heart murmur    Hx of blood clots    Hyperlipidemia    Hypertension    LBP (low back pain)    Liver problem    Obesity    OSA (obstructive sleep apnea) 03/22/2018   New diagnosis. Mild.    Pulmonary embolism (HCC)    Sarcoidosis    Sleep apnea    Vitamin  D deficiency     Past Surgical History:  Procedure Laterality Date   BREAST BIOPSY Left 2019   BUNIONECTOMY     CHOLECYSTECTOMY     COLPOSCOPY     GYNECOLOGIC CRYOSURGERY     KNEE ARTHROSCOPY     TUBAL LIGATION      Family History  Problem Relation Age of Onset   Heart failure Mother 39       chf   Diabetes Mother    High blood pressure Mother    Heart disease Mother    Sudden death Mother    Kidney disease Mother    Obesity Mother    Heart failure Father    Heart disease Father    Sudden death Father    Alcoholism Father    Cancer Brother 43       prostate   Hypertension Maternal Uncle    Hyperlipidemia Brother    Diabetes Brother    Colon cancer Maternal Grandfather    Colon polyps Brother    Diabetes Brother    Hyperlipidemia Brother    Stomach cancer Neg Hx    Esophageal cancer Neg Hx    Rectal cancer Neg Hx    Liver cancer Neg Hx    Breast cancer Neg Hx     Social History:  reports that she has never smoked. She has never used smokeless tobacco. She reports that she does not drink alcohol and does not use drugs.  Review of Systems:  Last diabetic eye exam date 9/21  Last urine microalbumin date: 9/23  Last foot exam date:10/22  Symptoms of neuropathy: None  Hypertension:   ACE/ARB medication: Previously using benazepril again and HCTZ as well as amlodipine but has not been taking these regularly as she ran out of refills  BP Readings from Last 3 Encounters:  11/27/21 (!) 152/98  08/07/21 (!) 180/100  04/06/21 130/78   She tends to have chronic edema of her legs Usually has normal renal function  Lab Results  Component Value Date   CREATININE 0.70 11/22/2021    CREATININE 0.92 10/26/2019   CREATININE 0.92 02/11/2019  ' Lipid management: Had been on simvastatin which she ran out of, lipids as follows    Lab Results  Component Value Date   CHOL 198 11/22/2021   CHOL 154 10/26/2019   CHOL 142 09/15/2018   Lab Results  Component Value Date   HDL 59.60 11/22/2021   HDL 49 10/26/2019   HDL 50 09/15/2018   Lab Results  Component Value Date   LDLCALC 118 (H) 11/22/2021   LDLCALC 83 10/26/2019   LDLCALC 69 09/15/2018   Lab Results  Component Value Date   TRIG 103.0 11/22/2021   TRIG 122 10/26/2019   TRIG 117 09/15/2018   Lab Results  Component Value Date   CHOLHDL 3 11/22/2021   CHOLHDL 3.1 10/26/2019   CHOLHDL 2.8 09/15/2018   No results found for: "LDLDIRECT"  Unable to calculate Fibrosis 4 Score. Requires ALT, AST, and platelet count within the last 6 months.      Examination:   BP (!) 152/98   Pulse 89   Ht '5\' 3"'$  (1.6 m)   Wt 252 lb 9.6 oz (114.6 kg)   SpO2 98%   BMI 44.75 kg/m   Body mass index is 44.75 kg/m.   She has significant swelling of her lower legs and feet, mostly having pitting edema on her feet  ASSESSMENT/ PLAN:    Diabetes type 2:  Current regimen: Trulicity, Farxiga and metformin  See history of present illness for detailed discussion of current diabetes management, blood sugar patterns and problems identified  A1c is 10.4 and higher than usual  She has been off her medications for at least a month including insulin causing her blood sugars to be significantly higher Also unlikely she is checking her blood sugars up especially after meals She has been trying herbal remedies which are not working and discussed that she has insulin deficiency as well as insulin resistance and the need for consistent pharmacological treatment and efforts to lose weight Does need regular glucose monitoring which she is unlikely doing at this time  Complications: Microalbuminuria, background retinopathy.    Although she reportedly has retinopathy she needs updated eye exam done  Recommendations for diabetes:  Since she may get somewhat better results with Mounjaro compared to Trulicity will switch Since she has not taken any Trulicity for some time she will need to start with 2.5 mg weekly Discussed with the patient the action of GIP/GLP-1 drugs, the effects on pancreatic and liver function, effects on brain and stomach with improved satiety, slowing gastric emptying, improving satiety and controlling liver glucose output.   Discussed the effects on promoting weight loss. Explained possible side effects of MOUNJARO, most commonly nausea that usually improves over time; discussed safety information in package insert.  Demonstrated the medication injection device and injection technique to the patient.  To start with 2.5 mg dosage weekly for the first 4 injections and then increase the dose to 5 mg weekly, written prescription given for 5 mg Patient brochure on Mounjaro and explained use of the available co-pay card  Restart BASAGLAR 30 units as before but if blood sugars are starting to be below 100 may reduce the dose to 25 For convenience we will combine her [place] and metformin and discussed that she needs to be on a therapeutically effective dose of 2000 mg metformin; she will start with 1 tablet daily of Xigduo 06/998 and then go to 2 tablets daily Encouraged her to start walking regularly for exercise on the days she is not doing a lot of yard work Consultation with diabetes educator for meal planning and review for management  HYPERTENSION: This is poorly controlled from not taking her medications and she does not have a follow-up with her PCP Will restart her benazepril, amlodipine and HCTZ  Hypercholesterolemia: She will resume her simvastatin which was previously controlling her hypercholesterolemia to target levels  Needs more regular follow-up  Influenza vaccine given  Patient  Instructions  Check blood sugars on waking up 3-4 days a week  Also check blood sugars about 2 hours after meals and do this after different meals by rotation  Recommended blood sugar levels on waking up are 90-130 and about 2 hours after meal is 130-180  Please bring your blood sugar monitor to each visit, thank you    Merleen Nicely will replace Farxiga and metformin, start with 1 tablet daily with food for 1 week and then 2 tablets daily either together or separately  If your morning sugar continues to stay more than 150 increase the insulin by 5 units Otherwise if blood sugars are below 100 in the morning may reduce Basaglar by 5 units  Mounjaro 2.5 mg weekly for 4 injections and then 5 mg weekly  Total visit time for evaluation and management, review of relevant previous history from the chart and counseling also and 40 minutes  Elayne Snare 11/27/2021, 11:48 AM

## 2021-11-28 ENCOUNTER — Other Ambulatory Visit: Payer: Self-pay | Admitting: Family Medicine

## 2021-12-17 ENCOUNTER — Encounter: Payer: Self-pay | Admitting: Internal Medicine

## 2022-01-01 ENCOUNTER — Other Ambulatory Visit: Payer: Self-pay | Admitting: Family Medicine

## 2022-01-01 ENCOUNTER — Encounter: Payer: 59 | Attending: Family Medicine | Admitting: Nutrition

## 2022-01-01 VITALS — Wt 249.4 lb

## 2022-01-01 DIAGNOSIS — Z1231 Encounter for screening mammogram for malignant neoplasm of breast: Secondary | ICD-10-CM

## 2022-01-01 DIAGNOSIS — E1169 Type 2 diabetes mellitus with other specified complication: Secondary | ICD-10-CM | POA: Insufficient documentation

## 2022-01-01 DIAGNOSIS — E785 Hyperlipidemia, unspecified: Secondary | ICD-10-CM | POA: Insufficient documentation

## 2022-01-01 NOTE — Progress Notes (Signed)
Patient concerned today about restarting her libre 2.  Did not remember how to do this.  She was shown again how to put on the sensor, and start the timer.  The readings are going to because her phone does not support the app.   She was reminded to bring the reader to each visit.    Current Diabetes medications: Xigduo: 2 tablets/day Mounjaro 2.5 once weeksly Basaglar: 36u.  She takes this sometimes in the morning, and sometimes in the evening.  Discussed timing of this medication and need to take this at the same time each day.    Exercise:   none.  Due to sore knees  chair exercises given and encouraged these for 20 minutes each day Diet: 3:30 AM:  water, coffee with spices- no carbs 6-12PM: [place] sausage with biscuit, or 2 eggs with 3 bacon,  5-6PM: 3-5 ounces protein with 1-2 vegetables.  No bread, water to drink 8PM: "few chips" Discussion:   Praised her for there weight loss and discussed need for some fruit.  Discussed having this as HS snack if not during the day.  Discussed appropriate portion sizes for this Discussed other breakfast options that have less saturated fat like oatmeal with nuts, or 2 pieces of toast with cheese or peanut butter, cottage cheese and fruit, etc.

## 2022-01-02 ENCOUNTER — Other Ambulatory Visit: Payer: Self-pay

## 2022-01-02 DIAGNOSIS — E1165 Type 2 diabetes mellitus with hyperglycemia: Secondary | ICD-10-CM

## 2022-01-02 MED ORDER — FREESTYLE LIBRE 2 SENSOR MISC: 1.0000 | 3 refills | Status: DC

## 2022-01-03 ENCOUNTER — Inpatient Hospital Stay: Admission: RE | Admit: 2022-01-03 | Payer: 59 | Source: Ambulatory Visit

## 2022-01-03 ENCOUNTER — Ambulatory Visit: Payer: 59

## 2022-01-08 ENCOUNTER — Telehealth: Payer: Self-pay

## 2022-01-08 ENCOUNTER — Ambulatory Visit: Payer: 59 | Admitting: Dietician

## 2022-01-08 ENCOUNTER — Other Ambulatory Visit: Payer: Self-pay

## 2022-01-08 ENCOUNTER — Ambulatory Visit
Admission: RE | Admit: 2022-01-08 | Discharge: 2022-01-08 | Disposition: A | Discharge status: Home / Self Care | Payer: 59 | Source: Ambulatory Visit | Attending: Family Medicine | Admitting: Family Medicine

## 2022-01-08 DIAGNOSIS — Z1231 Encounter for screening mammogram for malignant neoplasm of breast: Secondary | ICD-10-CM

## 2022-01-08 DIAGNOSIS — E1165 Type 2 diabetes mellitus with hyperglycemia: Secondary | ICD-10-CM

## 2022-01-08 MED ORDER — FREESTYLE LIBRE 2 READER DEVI: 0 refills | Status: DC

## 2022-01-08 MED ORDER — ONETOUCH VERIO VI STRP: ORAL_STRIP | 0 refills | Status: DC

## 2022-01-08 NOTE — Telephone Encounter (Signed)
Pt lvm to advise she accidentally knocked her [place] sensor off and it did not match with the receiver she had at home. Pt requested a call back.

## 2022-01-08 NOTE — Progress Notes (Signed)
Patient is here today as a walk in.  She saw Vaughan Basta, CDCES who placed a Libre 2 on patient last week but she tore this off by mistake.  Also, she determined that her reader was not compatible with the sensor.  (She has the [place] 14 day reader and her phone is not compatible with Elenor Legato 2.) She is out of strips as well but does not want to pay $100 for 90 day supply when she may have a CGM soon. Blood glucose in my office today was 113.   Order request for the [facility] 2 sensors and reader as well as strips for the verio flex by one touch was sent to the medical assistant.   Patient to call for further questions.   Antonieta Iba, RD, LDN, CDCES

## 2022-01-28 NOTE — Telephone Encounter (Signed)
Saw Mickel Baas on 11/14 for this

## 2022-01-30 ENCOUNTER — Other Ambulatory Visit (INDEPENDENT_AMBULATORY_CARE_PROVIDER_SITE_OTHER): Payer: 59

## 2022-01-30 DIAGNOSIS — E1169 Type 2 diabetes mellitus with other specified complication: Secondary | ICD-10-CM

## 2022-01-30 DIAGNOSIS — E1165 Type 2 diabetes mellitus with hyperglycemia: Secondary | ICD-10-CM

## 2022-01-30 DIAGNOSIS — Z794 Long term (current) use of insulin: Secondary | ICD-10-CM

## 2022-01-30 DIAGNOSIS — E785 Hyperlipidemia, unspecified: Secondary | ICD-10-CM

## 2022-01-30 LAB — BASIC METABOLIC PANEL
BUN: 12 mg/dL (ref 6–23)
CO2: 32 mEq/L (ref 19–32)
Calcium: 9.1 mg/dL (ref 8.4–10.5)
Chloride: 102 mEq/L (ref 96–112)
Creatinine, Ser: 0.83 mg/dL (ref 0.40–1.20)
GFR: 77.57 mL/min (ref >60.00)
Glucose, Bld: 142 mg/dL — ABNORMAL HIGH (ref 70–99)
Potassium: 4 mEq/L (ref 3.5–5.1)
Sodium: 139 mEq/L (ref 135–145)

## 2022-01-30 LAB — LDL CHOLESTEROL, DIRECT: Direct LDL: 62 mg/dL

## 2022-01-30 LAB — HEMOGLOBIN A1C: Hgb A1c MFr Bld: 9.1 % — ABNORMAL HIGH (ref 4.6–6.5)

## 2022-01-30 NOTE — Patient Instructions (Signed)
Scan sensor first thing in AM, and before lunch and bedtime Do chair exercises for 10-20 minutes 5-6 days/week

## 2022-02-01 ENCOUNTER — Other Ambulatory Visit: Payer: 59

## 2022-02-05 ENCOUNTER — Ambulatory Visit: Payer: 59 | Admitting: Endocrinology

## 2022-02-05 ENCOUNTER — Encounter: Payer: Self-pay | Admitting: Endocrinology

## 2022-02-05 VITALS — BP 140/84 | HR 84 | Ht 63.0 in | Wt 242.0 lb

## 2022-02-05 DIAGNOSIS — E1169 Type 2 diabetes mellitus with other specified complication: Secondary | ICD-10-CM

## 2022-02-05 DIAGNOSIS — E785 Hyperlipidemia, unspecified: Secondary | ICD-10-CM

## 2022-02-05 DIAGNOSIS — Z794 Long term (current) use of insulin: Secondary | ICD-10-CM | POA: Diagnosis not present

## 2022-02-05 DIAGNOSIS — E1165 Type 2 diabetes mellitus with hyperglycemia: Secondary | ICD-10-CM | POA: Diagnosis not present

## 2022-02-05 MED ORDER — TIRZEPATIDE 7.5 MG/0.5ML ~~LOC~~ SOAJ: 7.5000 mg | SUBCUTANEOUS | 1 refills | Status: DC

## 2022-02-05 NOTE — Patient Instructions (Signed)
Check blood sugars on waking up days a week  Also check blood sugars about 2 hours after meals and do this after different meals by rotation  Recommended blood sugar levels on waking up are 90-130 and about 2 hours after meal is 130-160  Please bring your blood sugar monitor to each visit, thank you   

## 2022-02-05 NOTE — Progress Notes (Signed)
Patient ID: Victoria Padilla, female   DOB: September 20, 1965, 56 y.o.   MRN: [id number]           Reason for Appointment: Type II Diabetes follow-up   History of Present Illness   Diagnosis date: 1998  Previous history:  Non-insulin hypoglycemic drugs previously used: Jardiance, glipizide, Trulicity, Farxiga, metformin, Januvia Insulin was started in 2019  A1c range in the last few years is: 7.3-11.1 since 2019  Recent history:     Non-insulin hypoglycemic drugs: Xigduo 2 tablets daily, Mounjaro 5 mg weekly     Insulin regimen: Basaglar 30 units at night daily        Side effects from medications: None  Current self management, blood sugar patterns and problems identified:  A1c is 9.1 compared to 10.4 On her initial visit here she had been off all medications and insulin for at least a month with much higher blood sugars She did not bring a monitor for download However has been taking Mounjaro instead of Trulicity that she had tried before  Although she initially had better satiety with starting this now with 5 mg she is still not having adequate satiety However was trying to cut back on her portions on her own she has lost 10 pounds  She is also now on Xigduo instead of metformin 1 g daily and [name] She says her blood sugars are in the low 100 range fasting and no more than 172 hours after dinner Lab glucose was 142 after lunch   Exercise: Walking at home and yard work  Diet management: No particular meal plan     Hypoglycemia:  none     Glucometer:?  Freestyle lite Blood sugars by recall:   PRE-MEAL Fasting Lunch Dinner Bedtime Overall  Glucose range: 100-110      Mean/median:        POST-MEAL PC Breakfast PC Lunch PC Dinner  Glucose range:   170  Mean/median:       Dietician visit: Most recent: 2019     Weight control:  Wt Readings from Last 3 Encounters:  02/05/22 242 lb (109.8 kg)  01/01/22 249 lb 6.4 oz (113.1 kg)  11/27/21 252 lb 9.6 oz (114.6 kg)             Diabetes labs:  Lab Results  Component Value Date   HGBA1C 9.1 (H) 01/30/2022   HGBA1C 10.4 (H) 11/22/2021   HGBA1C 7.3 (A) 03/20/2021   Lab Results  Component Value Date   MICROALBUR 100.2 (H) 11/22/2021   LDLCALC 118 (H) 11/22/2021   CREATININE 0.83 01/30/2022    Lab Results  Component Value Date   FRUCTOSAMINE 374 (H) 11/04/2018   FRUCTOSAMINE 310 (H) 09/02/2018     Allergies as of 02/05/2022       Reactions   Naprosyn [naproxen] Nausea Only        Medication List        Accurate as of February 05, 2022 11:48 AM. If you have any questions, ask your nurse or doctor.          amLODipine 5 MG tablet Commonly known as: NORVASC Take 1 tablet (5 mg total) by mouth daily.   B-D ULTRAFINE III SHORT PEN 31G X 8 MM Misc Generic drug: Insulin Pen Needle USE TO INJECT BASAGLAR DAILY- NEED APPT   Basaglar KwikPen 100 UNIT/ML Inject 30 Units into the skin every morning.   benazepril 40 MG tablet Commonly known as: LOTENSIN Take 1 tablet (40 mg total) by  mouth daily.   Besivance 0.6 % Susp Generic drug: Besifloxacin HCl   cetirizine 10 MG tablet Commonly known as: ZYRTEC Take 10 mg by mouth daily.   cholecalciferol 1000 units tablet Commonly known as: VITAMIN D Take 2,000 Units by mouth at bedtime.   clobetasol ointment 0.05 % Commonly known as: TEMOVATE APPLY TOPICALLY TWICE A DAY X 4 WEEKS, THEN ONCE A DAY X 4 WEEKS, THEN TWICE A WEEK.   diclofenac Sodium 1 % Gel Commonly known as: Voltaren Apply 2 g topically 4 (four) times daily.   FISH OIL PO Take 1 capsule by mouth at bedtime. Reported on 08/08/2015   FreeStyle Libre 2 Reader Kerrin Mo Use as instructed to check blood sugar daily   FreeStyle Libre 2 Sensor Misc 1 Device by Does not apply route every 14 (fourteen) days.   FREESTYLE LITE test strip Generic drug: glucose blood 1 each by Other route 2 (two) times daily. And lancets 2/day   OneTouch Verio test strip Generic drug:  glucose blood Use as instructed to check blood sugar 2X daily   hydrochlorothiazide 25 MG tablet Commonly known as: HYDRODIURIL Take 1 tablet (25 mg total) by mouth daily. Please make overdue appt with Dr. Acie Fredrickson before anymore refills. Thank you 1st attempt   metoprolol tartrate 25 MG tablet Commonly known as: LOPRESSOR TAKE 1 TABLET BY MOUTH TWICE A DAY   omeprazole 40 MG capsule Commonly known as: PRILOSEC TAKE 1 CAPSULE BY MOUTH EVERY DAY   ondansetron 4 MG tablet Commonly known as: ZOFRAN Take 4 mg by mouth every 8 (eight) hours as needed for nausea or vomiting.   rivaroxaban 10 MG Tabs tablet Commonly known as: Xarelto TAKE 1 TABLET (10 MG TOTAL) BY MOUTH DAILY. NEEDS APPOINTMENT FOR REFILLS   simvastatin 20 MG tablet Commonly known as: ZOCOR TAKE 1 TABLET BY MOUTH EVERYDAY AT BEDTIME   tirzepatide 5 MG/0.5ML Pen Commonly known as: MOUNJARO Inject 5 mg into the skin once a week. What changed: Another medication with the same name was removed. Continue taking this medication, and follow the directions you see here. Changed by: Elayne Snare, MD   Xigduo XR 06-998 MG Tb24 Generic drug: Dapagliflozin Pro-metFORMIN ER Take 2 tablets by mouth daily.        Allergies:  Allergies  Allergen Reactions   Naprosyn [Naproxen] Nausea Only    Past Medical History:  Diagnosis Date   Acid reflux    Anemia    Arthritis    Asthma    Cervical dysplasia    Diabetes mellitus    DVT (deep venous thrombosis) (HCC)    Gallbladder problem    GERD (gastroesophageal reflux disease)    Heart murmur    Hx of blood clots    Hyperlipidemia    Hypertension    LBP (low back pain)    Liver problem    Obesity    OSA (obstructive sleep apnea) 03/22/2018   New diagnosis. Mild.    Pulmonary embolism (HCC)    Sarcoidosis    Sleep apnea    Vitamin D deficiency     Past Surgical History:  Procedure Laterality Date   BREAST BIOPSY Left 2019   BUNIONECTOMY     CHOLECYSTECTOMY      COLPOSCOPY     GYNECOLOGIC CRYOSURGERY     KNEE ARTHROSCOPY     TUBAL LIGATION      Family History  Problem Relation Age of Onset   Heart failure Mother 44  chf   Diabetes Mother    High blood pressure Mother    Heart disease Mother    Sudden death Mother    Kidney disease Mother    Obesity Mother    Heart failure Father    Heart disease Father    Sudden death Father    Alcoholism Father    Cancer Brother 6       prostate   Hypertension Maternal Uncle    Hyperlipidemia Brother    Diabetes Brother    Colon cancer Maternal Grandfather    Colon polyps Brother    Diabetes Brother    Hyperlipidemia Brother    Stomach cancer Neg Hx    Esophageal cancer Neg Hx    Rectal cancer Neg Hx    Liver cancer Neg Hx    Breast cancer Neg Hx     Social History:  reports that she has never smoked. She has never used smokeless tobacco. She reports that she does not drink alcohol and does not use drugs.  Review of Systems:  Last diabetic eye exam date 9/21  Last urine microalbumin date: 9/23  Last foot exam date:10/22  Symptoms of neuropathy: None  Hypertension:   ACE/ARB medication: using benazepril again and HCTZ as well as amlodipine  BP Readings from Last 3 Encounters:  02/05/22 (!) 140/84  11/27/21 (!) 152/98  08/07/21 (!) 180/100   She tends to have chronic edema of her legs Usually has normal renal function  Lab Results  Component Value Date   CREATININE 0.83 01/30/2022   CREATININE 0.70 11/22/2021   CREATININE 0.92 10/26/2019  ' Lipid management: been on simvastatin prescribed by PCP She was told to restart this on her last visit and LDL is now 59    Lab Results  Component Value Date   CHOL 198 11/22/2021   CHOL 154 10/26/2019   CHOL 142 09/15/2018   Lab Results  Component Value Date   HDL 59.60 11/22/2021   HDL 49 10/26/2019   HDL 50 09/15/2018   Lab Results  Component Value Date   LDLCALC 118 (H) 11/22/2021   LDLCALC 83 10/26/2019    LDLCALC 69 09/15/2018   Lab Results  Component Value Date   TRIG 103.0 11/22/2021   TRIG 122 10/26/2019   TRIG 117 09/15/2018   Lab Results  Component Value Date   CHOLHDL 3 11/22/2021   CHOLHDL 3.1 10/26/2019   CHOLHDL 2.8 09/15/2018   Lab Results  Component Value Date   LDLDIRECT 62.0 01/30/2022    Unable to calculate Fibrosis 4 Score. Requires ALT, AST, and platelet count within the last 6 months.      Examination:   BP (!) 140/84   Pulse 84   Ht '5\' 3"'$  (1.6 m)   Wt 242 lb (109.8 kg)   SpO2 95%   BMI 42.87 kg/m   Body mass index is 42.87 kg/m.     ASSESSMENT/ PLAN:    Diabetes type 2:   Current regimen: Mounjaro, Basaglar 30 units, Xigduo   See history of present illness for detailed discussion of current diabetes management, blood sugar patterns and problems identified  A1c is 9.1  Although she did start back on her treatment regimen about 2 months ago her A1c is not improved as expected Did not bring her monitor for download today  Recommendations for diabetes:  Since she may get somewhat better results with 7.5 Mounjaro  Only if her blood sugars are starting to get below 90 she can  cut down her insulin by 2 units Start regular exercise She needs to take her Prilosec daily every morning to prevent GERD symptoms that she is asking about, not clear if this is related to GLP-1 drugs and has been chronic symptom Continue Xigduo Discussed blood sugar targets and need to bring a monitor for download regularly If she is able to afford Dexcom she can let us know her out-of-pocket expense Also given Singlecare card to see if this will help her with the cost of [place] 3  Hypercholesterolemia: She will continue her simvastatin      There are no Patient Instructions on file for this visit.   Elayne Snare 02/05/2022, 11:48 AM

## 2022-04-03 ENCOUNTER — Other Ambulatory Visit: Payer: Self-pay | Admitting: Family Medicine

## 2022-04-03 DIAGNOSIS — K219 Gastro-esophageal reflux disease without esophagitis: Secondary | ICD-10-CM

## 2022-04-06 ENCOUNTER — Other Ambulatory Visit: Payer: Self-pay | Admitting: Endocrinology

## 2022-04-06 DIAGNOSIS — I152 Hypertension secondary to endocrine disorders: Secondary | ICD-10-CM

## 2022-05-03 ENCOUNTER — Other Ambulatory Visit: Payer: 59

## 2022-05-13 ENCOUNTER — Ambulatory Visit: Payer: 59 | Admitting: Endocrinology

## 2022-05-26 ENCOUNTER — Other Ambulatory Visit: Payer: Self-pay | Admitting: Endocrinology

## 2022-05-27 ENCOUNTER — Telehealth: Payer: Self-pay

## 2022-05-27 NOTE — Telephone Encounter (Signed)
Patient notified

## 2022-05-27 NOTE — Telephone Encounter (Signed)
Mounjaro on backorder. Alternative?

## 2022-05-29 ENCOUNTER — Other Ambulatory Visit: Payer: Self-pay

## 2022-05-29 MED ORDER — TRULICITY 3 MG/0.5ML ~~LOC~~ SOAJ: 3.0000 mg | SUBCUTANEOUS | 3 refills | Status: DC

## 2022-06-17 ENCOUNTER — Encounter: Payer: Self-pay | Admitting: Nurse Practitioner

## 2022-06-17 ENCOUNTER — Ambulatory Visit: Payer: 59 | Admitting: Nurse Practitioner

## 2022-06-17 VITALS — BP 128/82 | HR 80 | Wt 245.4 lb

## 2022-06-17 DIAGNOSIS — J45909 Unspecified asthma, uncomplicated: Secondary | ICD-10-CM | POA: Diagnosis not present

## 2022-06-17 DIAGNOSIS — J0110 Acute frontal sinusitis, unspecified: Secondary | ICD-10-CM

## 2022-06-17 DIAGNOSIS — J4521 Mild intermittent asthma with (acute) exacerbation: Secondary | ICD-10-CM | POA: Insufficient documentation

## 2022-06-17 DIAGNOSIS — R21 Rash and other nonspecific skin eruption: Secondary | ICD-10-CM

## 2022-06-17 DIAGNOSIS — J069 Acute upper respiratory infection, unspecified: Secondary | ICD-10-CM

## 2022-06-17 DIAGNOSIS — B9689 Other specified bacterial agents as the cause of diseases classified elsewhere: Secondary | ICD-10-CM

## 2022-06-17 HISTORY — DX: Rash and other nonspecific skin eruption: R21

## 2022-06-17 HISTORY — DX: Acute frontal sinusitis, unspecified: J01.10

## 2022-06-17 HISTORY — DX: Mild intermittent asthma with (acute) exacerbation: J45.21

## 2022-06-17 MED ORDER — TRELEGY ELLIPTA 200-62.5-25 MCG/ACT IN AEPB: INHALATION_SPRAY | RESPIRATORY_TRACT | 0 refills | Status: AC

## 2022-06-17 MED ORDER — PREDNISONE 20 MG PO TABS
20.0000 mg | ORAL_TABLET | Freq: Every day | ORAL | 0 refills | Status: DC
Start: 2022-06-17 — End: 2022-07-18

## 2022-06-17 MED ORDER — MOMETASONE FUROATE 0.1 % EX CREA
TOPICAL_CREAM | CUTANEOUS | 1 refills | Status: AC
Start: 2022-06-17 — End: ?

## 2022-06-17 MED ORDER — AZITHROMYCIN 250 MG PO TABS
ORAL_TABLET | ORAL | 0 refills | Status: AC
Start: 2022-06-17 — End: 2022-06-22

## 2022-06-17 MED ORDER — AIRSUPRA 90-80 MCG/ACT IN AERO: 1.0000 | INHALATION_SPRAY | RESPIRATORY_TRACT | 0 refills | Status: AC

## 2022-06-17 MED ORDER — LEVOCETIRIZINE DIHYDROCHLORIDE 2.5 MG/5ML PO SOLN: 5.0000 mg | Freq: Every evening | ORAL | 12 refills | Status: DC

## 2022-06-17 MED ORDER — OLOPATADINE HCL 0.2 % OP SOLN: OPHTHALMIC | 3 refills | Status: DC

## 2022-06-17 NOTE — Assessment & Plan Note (Signed)
Frontal sinus tenderness in the setting of ongoing allergy symptoms with varying pattern. There is also concern for lung involvement given wheezing, rhonchi, and cough.  Plan: - Prescription for azithromycin sent for suspected infectious process.

## 2022-06-17 NOTE — Progress Notes (Signed)
Tollie Eth, DNP, AGNP-c [facility] [address] [place], [place] [address] [phone]  Subjective:   Victoria Padilla is a 57 y.o. female presents to day for evaluation of: Allergies Jullisa presents today with chief complaints of worsening allergy symptoms, including facial swelling, itching around both eyes, and shortness of breath/cough. She reports that her allergy symptoms usually occur around this time of the year but have never been this severe before. The patient experienced a recent illness starting on the 28th, with sneezing and coughing, which has since improved but is still present. She is currently taking Allegra and cough suppressants.  She reports her cough is productive with clear and sometimes cloudy mucus. She denies any sinus pressure but mentions tenderness around her ears and face. The patient has been experiencing difficulty sleeping due to her cough. She also reports a rash on her face that coincides with the allergy symptoms.  PMH, Medications, and Allergies reviewed and updated in chart as appropriate.   ROS negative except for what is listed in HPI. Objective:  BP 128/82   Pulse 80   Wt 245 lb 6.4 oz (111.3 kg)   BMI 43.47 kg/m  Physical Exam Vitals and nursing note reviewed.  Constitutional:      Appearance: She is ill-appearing.  HENT:     Head:     Comments: Mild perioral edema noted with scaling rash scattered on face and neck.    Right Ear: A middle ear effusion is present.     Left Ear: A middle ear effusion is present.     Nose: Mucosal edema, congestion and rhinorrhea present.     Right Sinus: Frontal sinus tenderness present.     Left Sinus: Frontal sinus tenderness present.     Mouth/Throat:     Mouth: Mucous membranes are moist.  Eyes:     General: Allergic shiner present.  Cardiovascular:     Rate and Rhythm: Normal rate and regular rhythm.     Pulses: Normal pulses.     Heart sounds: Normal heart sounds.   Pulmonary:     Effort: Pulmonary effort is normal.     Breath sounds: Wheezing present.  Musculoskeletal:        General: Normal range of motion.  Lymphadenopathy:     Cervical: Cervical adenopathy present.  Skin:    General: Skin is warm and dry.     Capillary Refill: Capillary refill takes less than 2 seconds.     Findings: Rash present.  Neurological:     General: No focal deficit present.     Mental Status: She is alert.  Psychiatric:        Mood and Affect: Mood normal.           Assessment & Plan:   Problem List Items Addressed This Visit     Reactive airway disease, mild intermittent, with acute exacerbation - Primary    Symptoms and presentation consistent with reactive airway disease associated with allergies. She is not currently on any treatment for airway management. Exam reveals wheezing and rhonchi present bilaterally throughout the lung fields.  Plan: Plan: - Provide samples of Trelegy (fluticasone/umeclidinium/vilanterol) for maintenance therapy. - Provide sample of Airspura (albuterol/budesonide) for rescue therapy. - Instruct the patient on proper inhaler technique and usage.      Relevant Medications   Olopatadine HCl 0.2 % SOLN   Fluticasone-Umeclidin-Vilant (TRELEGY ELLIPTA) 200-62.5-25 MCG/ACT AEPB   Albuterol-Budesonide (AIRSUPRA) 90-80 MCG/ACT AERO   azithromycin (ZITHROMAX) 250 MG tablet  levocetirizine (XYZAL) 2.5 MG/5ML solution   predniSONE (DELTASONE) 20 MG tablet   Acute non-recurrent frontal sinusitis    Frontal sinus tenderness in the setting of ongoing allergy symptoms with varying pattern. There is also concern for lung involvement given wheezing, rhonchi, and cough.  Plan: - Prescription for azithromycin sent for suspected infectious process.       Relevant Medications   Olopatadine HCl 0.2 % SOLN   Fluticasone-Umeclidin-Vilant (TRELEGY ELLIPTA) 200-62.5-25 MCG/ACT AEPB   Albuterol-Budesonide (AIRSUPRA) 90-80 MCG/ACT AERO    azithromycin (ZITHROMAX) 250 MG tablet   levocetirizine (XYZAL) 2.5 MG/5ML solution   predniSONE (DELTASONE) 20 MG tablet   Rash and nonspecific skin eruption    Rash present to the face and neck in the setting of allergy exacerbation. I suspect that this is a manifestation of the allergy symptoms.  Plan: - Prescribe Xyzal to be taken at bedtime for allergy management - Include script for prednisone burst in the event the symptoms do not improve with allergy treatment. - Prescribe mometasone for short term treatment of rash symptoms.       Relevant Medications   mometasone (ELOCON) 0.1 % cream   Other Visit Diagnoses     Asthma due to environmental allergies       Relevant Medications   Olopatadine HCl 0.2 % SOLN   Fluticasone-Umeclidin-Vilant (TRELEGY ELLIPTA) 200-62.5-25 MCG/ACT AEPB   Albuterol-Budesonide (AIRSUPRA) 90-80 MCG/ACT AERO   azithromycin (ZITHROMAX) 250 MG tablet   levocetirizine (XYZAL) 2.5 MG/5ML solution   predniSONE (DELTASONE) 20 MG tablet   Bacterial URI       Relevant Medications   Olopatadine HCl 0.2 % SOLN   Fluticasone-Umeclidin-Vilant (TRELEGY ELLIPTA) 200-62.5-25 MCG/ACT AEPB   Albuterol-Budesonide (AIRSUPRA) 90-80 MCG/ACT AERO   azithromycin (ZITHROMAX) 250 MG tablet   levocetirizine (XYZAL) 2.5 MG/5ML solution   predniSONE (DELTASONE) 20 MG tablet         Tollie Eth, DNP, AGNP-c 06/17/2022  9:22 PM    History, Medications, Surgery, SDOH, and Family History reviewed and updated as appropriate.

## 2022-06-17 NOTE — Assessment & Plan Note (Signed)
Rash present to the face and neck in the setting of allergy exacerbation. I suspect that this is a manifestation of the allergy symptoms.  Plan: - Prescribe Xyzal to be taken at bedtime for allergy management - Include script for prednisone burst in the event the symptoms do not improve with allergy treatment. - Prescribe mometasone for short term treatment of rash symptoms.

## 2022-06-17 NOTE — Assessment & Plan Note (Signed)
Symptoms and presentation consistent with reactive airway disease associated with allergies. She is not currently on any treatment for airway management. Exam reveals wheezing and rhonchi present bilaterally throughout the lung fields.  Plan: Plan: - Provide samples of Trelegy (fluticasone/umeclidinium/vilanterol) for maintenance therapy. - Provide sample of Airspura (albuterol/budesonide) for rescue therapy. - Instruct the patient on proper inhaler technique and usage.

## 2022-07-15 ENCOUNTER — Other Ambulatory Visit: Payer: 59

## 2022-07-16 ENCOUNTER — Other Ambulatory Visit (INDEPENDENT_AMBULATORY_CARE_PROVIDER_SITE_OTHER): Payer: 59

## 2022-07-16 ENCOUNTER — Other Ambulatory Visit: Payer: 59

## 2022-07-16 DIAGNOSIS — E1165 Type 2 diabetes mellitus with hyperglycemia: Secondary | ICD-10-CM

## 2022-07-16 DIAGNOSIS — Z794 Long term (current) use of insulin: Secondary | ICD-10-CM

## 2022-07-16 LAB — BASIC METABOLIC PANEL
BUN: 14 mg/dL (ref 6–23)
CO2: 26 mEq/L (ref 19–32)
Calcium: 9 mg/dL (ref 8.4–10.5)
Chloride: 104 mEq/L (ref 96–112)
Creatinine, Ser: 0.73 mg/dL (ref 0.40–1.20)
GFR: 90.2 mL/min (ref >60.00)
Glucose, Bld: 132 mg/dL — ABNORMAL HIGH (ref 70–99)
Potassium: 4.2 mEq/L (ref 3.5–5.1)
Sodium: 137 mEq/L (ref 135–145)

## 2022-07-16 LAB — HEMOGLOBIN A1C: Hgb A1c MFr Bld: 8 % — ABNORMAL HIGH (ref 4.6–6.5)

## 2022-07-18 ENCOUNTER — Encounter: Payer: Self-pay | Admitting: Endocrinology

## 2022-07-18 ENCOUNTER — Ambulatory Visit: Payer: 59 | Admitting: Endocrinology

## 2022-07-18 VITALS — BP 168/90 | HR 94 | Ht 63.0 in | Wt 243.0 lb

## 2022-07-18 DIAGNOSIS — Z7985 Long-term (current) use of injectable non-insulin antidiabetic drugs: Secondary | ICD-10-CM

## 2022-07-18 DIAGNOSIS — I10 Essential (primary) hypertension: Secondary | ICD-10-CM

## 2022-07-18 DIAGNOSIS — E1129 Type 2 diabetes mellitus with other diabetic kidney complication: Secondary | ICD-10-CM

## 2022-07-18 DIAGNOSIS — Z794 Long term (current) use of insulin: Secondary | ICD-10-CM

## 2022-07-18 DIAGNOSIS — E1169 Type 2 diabetes mellitus with other specified complication: Secondary | ICD-10-CM

## 2022-07-18 DIAGNOSIS — E1165 Type 2 diabetes mellitus with hyperglycemia: Secondary | ICD-10-CM | POA: Diagnosis not present

## 2022-07-18 DIAGNOSIS — E785 Hyperlipidemia, unspecified: Secondary | ICD-10-CM

## 2022-07-18 DIAGNOSIS — Z7984 Long term (current) use of oral hypoglycemic drugs: Secondary | ICD-10-CM

## 2022-07-18 DIAGNOSIS — R809 Proteinuria, unspecified: Secondary | ICD-10-CM

## 2022-07-18 MED ORDER — DAPAGLIFLOZIN PRO-METFORMIN ER 5-1000 MG PO TB24: 2.0000 | ORAL_TABLET | Freq: Every day | ORAL | 2 refills | Status: AC

## 2022-07-18 NOTE — Progress Notes (Signed)
Patient ID: Victoria Padilla, female   DOB: 11-08-1965, 57 y.o.   MRN: [id number]           Reason for Appointment: Type II Diabetes follow-up   History of Present Illness   Diagnosis date: 1998  Previous history:  Non-insulin hypoglycemic drugs previously used: Jardiance, glipizide, Trulicity, Farxiga, metformin, Januvia Insulin was started in 2019  A1c range in the last few years is: 7.3-11.1 since 2019  Recent history:     Non-insulin hypoglycemic drugs: Xigduo 2 tablets daily, Mounjaro 7.5 mg weekly     Insulin regimen: Basaglar 30 units at night daily        Side effects from medications: None  Current self management, blood sugar patterns and problems identified:  A1c is 8%, previously 9.1 compared to 10.4 in 9/23 She is still monitoring blood sugars very infrequently and only rarely after meals Although fasting blood sugars  appear to be fairly good her A1c is still relatively high Has been irregular with her follow-up also Greggory Keen has been increased to 7.5 mg weekly which she has taken except on 1 or 2 weeks However still has not lost any weight She says she does not like taking Xigduo and prefers to take metformin and [place] without any specific reason She has not taken metformin and Farxiga consistently However she thinks she has taken her insulin regularly even though pharmacy information does not show regular refills Lab glucose was 132 after lunch  Exercise: Walking at home and yard work  Diet management: No particular meal plan, occasionally fried food like fish sticks     Hypoglycemia:  none     Glucometer:Accucheck   PRE-MEAL Fasting Lunch Dinner Bedtime Overall  Glucose range: 89, 113      Mean/median:        POST-MEAL PC Breakfast PC Lunch PC Dinner  Glucose range:  166 ?  Mean/median:       Dietician visit: Most recent: 2019  Last CDE visit 12/2021  Weight control:  Wt Readings from Last 3 Encounters:  07/18/22 243 lb (110.2 kg)   06/17/22 245 lb 6.4 oz (111.3 kg)  02/05/22 242 lb (109.8 kg)            Diabetes labs:  Lab Results  Component Value Date   HGBA1C 8.0 (H) 07/16/2022   HGBA1C 9.1 (H) 01/30/2022   HGBA1C 10.4 (H) 11/22/2021   Lab Results  Component Value Date   MICROALBUR 100.2 (H) 11/22/2021   LDLCALC 118 (H) 11/22/2021   CREATININE 0.73 07/16/2022    Lab Results  Component Value Date   FRUCTOSAMINE 374 (H) 11/04/2018   FRUCTOSAMINE 310 (H) 09/02/2018     Allergies as of 07/18/2022       Reactions   Naprosyn [naproxen] Nausea Only        Medication List        Accurate as of Jul 18, 2022 11:53 AM. If you have any questions, ask your nurse or doctor.          STOP taking these medications    predniSONE 20 MG tablet Commonly known as: DELTASONE Stopped by: Reather Littler, MD       TAKE these medications    Airsupra 90-80 MCG/ACT Aero Generic drug: Albuterol-Budesonide Inhale 1-2 Inhalations into the lungs every 4 (four) hours. As needed for cough, wheeze, shortness of breath.   amLODipine 5 MG tablet Commonly known as: NORVASC Take 1 tablet (5 mg total) by mouth daily.   B-D ULTRAFINE  III SHORT PEN 31G X 8 MM Misc Generic drug: Insulin Pen Needle USE TO INJECT BASAGLAR DAILY- NEED APPT   Basaglar KwikPen 100 UNIT/ML Inject 30 Units into the skin every morning.   benazepril 40 MG tablet Commonly known as: LOTENSIN Take 1 tablet (40 mg total) by mouth daily.   Besivance 0.6 % Susp Generic drug: Besifloxacin HCl   cetirizine 10 MG tablet Commonly known as: ZYRTEC Take 10 mg by mouth daily.   cholecalciferol 1000 units tablet Commonly known as: VITAMIN D Take 2,000 Units by mouth at bedtime.   clobetasol ointment 0.05 % Commonly known as: TEMOVATE APPLY TOPICALLY TWICE A DAY X 4 WEEKS, THEN ONCE A DAY X 4 WEEKS, THEN TWICE A WEEK.   diclofenac Sodium 1 % Gel Commonly known as: Voltaren Apply 2 g topically 4 (four) times daily.   Farxiga 10 MG Tabs  tablet Generic drug: dapagliflozin propanediol Take 10 mg by mouth daily.   FISH OIL PO Take 1 capsule by mouth at bedtime. Reported on 08/08/2015   FreeStyle Libre 2 Reader Hardie Pulley Use as instructed to check blood sugar daily   FreeStyle Libre 2 Sensor Misc 1 Device by Does not apply route every 14 (fourteen) days.   FREESTYLE LITE test strip Generic drug: glucose blood 1 each by Other route 2 (two) times daily. And lancets 2/day   OneTouch Verio test strip Generic drug: glucose blood Use as instructed to check blood sugar 2X daily   hydrochlorothiazide 25 MG tablet Commonly known as: HYDRODIURIL TAKE 1 TABLET BY MOUTH DAILY *NEED APPOINTMENT FOR REFILLS*   levocetirizine 2.5 MG/5ML solution Commonly known as: XYZAL Take 10 mLs (5 mg total) by mouth every evening.   metoprolol tartrate 25 MG tablet Commonly known as: LOPRESSOR TAKE 1 TABLET BY MOUTH TWICE A DAY   mometasone 0.1 % cream Commonly known as: ELOCON Apply a thin layer to the skin once a day for rash.   Olopatadine HCl 0.2 % Soln Place 2-4 drops in the eyes every 4-6 hours as needed for allergy symptoms.   omeprazole 40 MG capsule Commonly known as: PRILOSEC TAKE 1 CAPSULE BY MOUTH EVERY DAY   ondansetron 4 MG tablet Commonly known as: ZOFRAN Take 4 mg by mouth every 8 (eight) hours as needed for nausea or vomiting.   rivaroxaban 10 MG Tabs tablet Commonly known as: Xarelto TAKE 1 TABLET (10 MG TOTAL) BY MOUTH DAILY. NEEDS APPOINTMENT FOR REFILLS   simvastatin 20 MG tablet Commonly known as: ZOCOR TAKE 1 TABLET BY MOUTH EVERYDAY AT BEDTIME   tirzepatide 7.5 MG/0.5ML Pen Commonly known as: MOUNJARO Inject 7.5 mg into the skin once a week.   Trelegy Ellipta 200-62.5-25 MCG/ACT Aepb Generic drug: Fluticasone-Umeclidin-Vilant Once a day.   Trulicity 3 MG/0.5ML Sopn Generic drug: Dulaglutide Inject 3 mg as directed once a week.   Xigduo XR 06-998 MG Tb24 Generic drug: Dapagliflozin Pro-metFORMIN  ER Take 2 tablets by mouth daily.        Allergies:  Allergies  Allergen Reactions   Naprosyn [Naproxen] Nausea Only    Past Medical History:  Diagnosis Date   Acid reflux    Anemia    Arthritis    Asthma    Cervical dysplasia    Diabetes mellitus    DVT (deep venous thrombosis) (HCC)    Gallbladder problem    GERD (gastroesophageal reflux disease)    Heart murmur    Hx of blood clots    Hyperlipidemia    Hypertension  LBP (low back pain)    Liver problem    Obesity    OSA (obstructive sleep apnea) 03/22/2018   New diagnosis. Mild.    Pulmonary embolism (HCC)    Sarcoidosis    Sleep apnea    Vitamin D deficiency     Past Surgical History:  Procedure Laterality Date   BREAST BIOPSY Left 2019   BUNIONECTOMY     CHOLECYSTECTOMY     COLPOSCOPY     GYNECOLOGIC CRYOSURGERY     KNEE ARTHROSCOPY     TUBAL LIGATION      Family History  Problem Relation Age of Onset   Heart failure Mother 23       chf   Diabetes Mother    High blood pressure Mother    Heart disease Mother    Sudden death Mother    Kidney disease Mother    Obesity Mother    Heart failure Father    Heart disease Father    Sudden death Father    Alcoholism Father    Cancer Brother 26       prostate   Hypertension Maternal Uncle    Hyperlipidemia Brother    Diabetes Brother    Colon cancer Maternal Grandfather    Colon polyps Brother    Diabetes Brother    Hyperlipidemia Brother    Stomach cancer Neg Hx    Esophageal cancer Neg Hx    Rectal cancer Neg Hx    Liver cancer Neg Hx    Breast cancer Neg Hx     Social History:  reports that she has never smoked. She has never used smokeless tobacco. She reports that she does not drink alcohol and does not use drugs.  Review of Systems:  Last diabetic eye exam date 9/21  Last urine microalbumin date: 9/23  Last foot exam date:10/22  Symptoms of neuropathy: None  Hypertension:   ACE/ARB medication: using benazepril again and HCTZ  as well as amlodipine However has not taken any medications for 2 days  BP Readings from Last 3 Encounters:  07/18/22 (!) 168/90  06/17/22 128/82  02/05/22 (!) 140/84   She tends to have chronic edema of her legs Usually has normal renal function  Lab Results  Component Value Date   CREATININE 0.73 07/16/2022   CREATININE 0.83 01/30/2022   CREATININE 0.70 11/22/2021  ' Lipid management: been on simvastatin prescribed by PCP She was told to restart this on her last visit and LDL is now 62    Lab Results  Component Value Date   CHOL 198 11/22/2021   CHOL 154 10/26/2019   CHOL 142 09/15/2018   Lab Results  Component Value Date   HDL 59.60 11/22/2021   HDL 49 10/26/2019   HDL 50 09/15/2018   Lab Results  Component Value Date   LDLCALC 118 (H) 11/22/2021   LDLCALC 83 10/26/2019   LDLCALC 69 09/15/2018   Lab Results  Component Value Date   TRIG 103.0 11/22/2021   TRIG 122 10/26/2019   TRIG 117 09/15/2018   Lab Results  Component Value Date   CHOLHDL 3 11/22/2021   CHOLHDL 3.1 10/26/2019   CHOLHDL 2.8 09/15/2018   Lab Results  Component Value Date   LDLDIRECT 62.0 01/30/2022    Unable to calculate Fibrosis 4 Score. Requires ALT, AST, and platelet count within the last 6 months.      Examination:   BP (!) 168/90 Comment: no medicaiton  Pulse 94   Ht 5'  3" (1.6 m)   Wt 243 lb (110.2 kg)   SpO2 98%   BMI 43.05 kg/m   Body mass index is 43.05 kg/m.     ASSESSMENT/ PLAN:    Diabetes type 2:   Current regimen: Mounjaro, Basaglar 30 units, Xigduo   See history of present illness for detailed discussion of current diabetes management, blood sugar patterns and problems identified  A1c is 8, slightly better  As above she can do better with glucose monitoring as discussed in detail as well as taking all her medications as prescribed, also may benefit from improved diet and more efforts to lose weight  Recommendations for diabetes:   Continue same  dose of Mounjaro and take consistently every week No change in insulin but if blood sugars are starting to get below 90 she can cut down her insulin by 2 units Start more significant exercise or increase walking Restart Xigduo and take regularly, to call if she has any GI side effects Discussed blood sugar targets both before and after meals More consistent follow-up Discussed needing to check readings consistently after meals To cut back on fried food She will let us know when she is ready to start the sensor when she gets her new phone and will prescribe libre 3, currently she does not want to purchase the reader  Hypercholesterolemia: She will continue her simvastatin, will need follow-up lipids  HYPERTENSION: Discussed need to take her antihypertensives consistently and not run out of medications, follow-up with PCP will be needed  Patient Instructions  Check blood sugars on waking up 2-3 days a week  Also check blood sugars about 2 hours after meals and do this after different meals by rotation  Recommended blood sugar levels on waking up are 90-130 and about 2 hours after meal is 130-160  Please bring your blood sugar monitor to each visit, thank you    Reather Littler 07/18/2022, 11:53 AM

## 2022-07-18 NOTE — Patient Instructions (Signed)
Check blood sugars on waking up 2-3 days a week  Also check blood sugars about 2 hours after meals and do this after different meals by rotation  Recommended blood sugar levels on waking up are 90-130 and about 2 hours after meal is 130-160  Please bring your blood sugar monitor to each visit, thank you   

## 2022-08-15 ENCOUNTER — Encounter (HOSPITAL_COMMUNITY): Payer: Self-pay

## 2022-08-15 ENCOUNTER — Ambulatory Visit (HOSPITAL_COMMUNITY)
Admission: EM | Admit: 2022-08-15 | Discharge: 2022-08-15 | Disposition: A | Discharge status: Home / Self Care | Payer: 59

## 2022-08-15 DIAGNOSIS — R519 Headache, unspecified: Secondary | ICD-10-CM

## 2022-08-15 DIAGNOSIS — I10 Essential (primary) hypertension: Secondary | ICD-10-CM
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
## 2022-08-15 NOTE — Discharge Instructions (Addendum)
As we discussed, the safest thing to do is to go to the emergency room.  Given you have had improvement it is reasonable to monitor your symptoms closely at home.  Make sure that you are resting and drinking plenty of fluid.  Follow-up with your primary care tomorrow.  Make sure you take all of your blood pressure medicine.  If your headache is not improving or if you have any worsening symptoms including increasing headache pain, worst headache of your life, vision change, weakness, nausea, vomiting you must go to the emergency room immediately.  I would have a very low threshold for going to the ER as we discussed.

## 2022-08-15 NOTE — ED Triage Notes (Signed)
Pt states headache and neck pain states her BP 212/108 when she checked it at 1400.

## 2022-08-20 ENCOUNTER — Ambulatory Visit: Payer: 59 | Admitting: Nurse Practitioner

## 2022-08-20 ENCOUNTER — Encounter: Payer: Self-pay | Admitting: Nurse Practitioner

## 2022-08-20 VITALS — BP 138/86 | HR 78 | Wt 243.4 lb

## 2022-08-20 DIAGNOSIS — M62838 Other muscle spasm: Secondary | ICD-10-CM | POA: Diagnosis not present

## 2022-08-20 DIAGNOSIS — M542 Cervicalgia: Secondary | ICD-10-CM | POA: Diagnosis not present

## 2022-08-20 DIAGNOSIS — I10 Essential (primary) hypertension: Secondary | ICD-10-CM | POA: Diagnosis not present

## 2022-08-20 DIAGNOSIS — M5481 Occipital neuralgia: Secondary | ICD-10-CM

## 2022-08-20 MED ORDER — CYCLOBENZAPRINE HCL 10 MG PO TABS
5.0000 mg | ORAL_TABLET | Freq: Three times a day (TID) | ORAL | 3 refills | Status: AC | PRN
Start: 2022-08-20 — End: ?
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
## 2022-08-20 NOTE — Patient Instructions (Addendum)
[facility] [address] [employer]- you can walk in to have this done at your convenience.   I will be in touch once we get the x-ray results and we can determine if additional treatment is needed or next steps.   Occipital Neuralgia  Occipital neuralgia is a type of headache that causes brief episodes of very bad pain in the back of the head. Pain from occipital neuralgia may spread (radiate) to other parts of the head. These headaches may be caused by irritation of the nerves that leave the spinal cord high up in the neck, just below the base of the skull (occipital nerves). The occipital nerves transmit sensations from the back of the head, the top of the head, and the areas behind the ears. What are the causes? This condition can occur without any known cause (primary headache syndrome). In other cases, this condition is caused by pressure on or irritation of one of the two occipital nerves. Pressure and irritation may be due to: Muscle spasm in the neck. Neck injury. Wear and tear of the vertebrae in the neck (osteoarthritis). Disease of the disks that separate the vertebrae. Swollen blood vessels that put pressure on the occipital nerves. Infections. Tumors. Diabetes. What are the signs or symptoms? This condition causes brief burning, stabbing, electric, shocking, or shooting pain in the back of the head that can radiate to the top of the head. It can happen on one side or both sides of the head. It can also cause: Pain behind the eye. Pain triggered by neck movement or hair brushing. Scalp tenderness. Aching in the back of the head between episodes of very bad pain. Pain that gets worse with exposure to bright lights. How is this diagnosed? Your health care provider may diagnose the condition based on a physical exam and your symptoms. Tests may be done, such as: Imaging studies of the brain and neck (cervical spine), such as an MRI or CT scan. These look for causes of  pinched nerves. Applying pressure to the nerves in the neck to try to re-create the pain. Injection of numbing medicine into the occipital nerve areas to see if pain goes away (diagnostic nerve block). How is this treated? Treatment for this condition may begin with simple measures, such as: Rest. Massage. Applying heat or cold to the area. Over-the-counter pain relievers. If these measures do not work, you may need other treatments, including: Medicines, such as: Prescription-strength anti-inflammatory medicines. Muscle relaxants. Anti-seizure medicines, which can relieve pain. Antidepressants, which can relieve pain. Injected medicines, such as medicines that numb the area (local anesthetic) and steroids. Pulsed radiofrequency ablation. This is when wires are implanted to deliver electrical impulses that block pain signals from the occipital nerve. Surgery to relieve nerve pressure. Physical therapy. Follow these instructions at home: Managing pain     Avoid any activities that cause pain. Rest when you have an attack of pain. Try gentle massage to relieve pain. Try a different pillow or sleeping position. If directed, apply heat to the affected area as often as told by your health care provider. Use the heat source that your health care provider recommends, such as a moist heat pack or a heating pad. Place a towel between your skin and the heat source. Leave the heat on for 20-30 minutes. Remove the heat if your skin turns bright red. This is especially important if you are unable to feel pain, heat, or cold. You have a greater risk of getting burned. If  directed, put ice on the back of your head and neck area. To do this: Put ice in a plastic bag. Place a towel between your skin and the bag. Leave the ice on for 20 minutes, 2-3 times a day. Remove the ice if your skin turns bright red. This is very important. If you cannot feel pain, heat, or cold, you have a greater risk of  damage to the area. General instructions Take over-the-counter and prescription medicines only as told by your health care provider. Avoid things that make your symptoms worse, such as bright lights. Try to stay active. Get regular exercise that does not cause pain. Ask your health care provider to suggest safe exercises for you. Work with a physical therapist to learn stretching exercises you can do at home. Practice good posture. Keep all follow-up visits. This is important. Contact a health care provider if: Your medicine is not working. You have new or worsening symptoms. Get help right away if: You have very bad head pain that does not go away. You have a sudden change in vision, balance, or speech. These symptoms may represent a serious problem that is an emergency. Do not wait to see if the symptoms will go away. Get medical help right away. Call your local emergency services (911 in the [place] Do not drive yourself to the hospital. Summary Occipital neuralgia is a type of headache that causes brief episodes of very bad pain in the back of the head. Pain from occipital neuralgia may spread (radiate) to other parts of the head. Treatment for this condition includes rest, massage, and medicines. This information is not intended to replace advice given to you by your health care provider. Make sure you discuss any questions you have with your health care provider. Document Revised: 12/12/2019 Document Reviewed: 12/12/2019 Elsevier Patient Education  2024 [employer].

## 2022-08-22 ENCOUNTER — Ambulatory Visit
Admission: RE | Admit: 2022-08-22 | Discharge: 2022-08-22 | Disposition: A | Discharge status: Home / Self Care | Payer: 59 | Source: Ambulatory Visit | Attending: Nurse Practitioner | Admitting: Nurse Practitioner

## 2022-08-22 DIAGNOSIS — M62838 Other muscle spasm: Secondary | ICD-10-CM

## 2022-08-22 DIAGNOSIS — M5481 Occipital neuralgia: Secondary | ICD-10-CM

## 2022-08-22 DIAGNOSIS — M542 Cervicalgia: Secondary | ICD-10-CM

## 2022-08-28 ENCOUNTER — Telehealth: Payer: Self-pay | Admitting: Nurse Practitioner

## 2022-08-28 NOTE — Telephone Encounter (Signed)
X-ray results have not come back for her yet. I will keep my eye open for them.

## 2022-08-28 NOTE — Telephone Encounter (Signed)
Pt called about her x ray results and I let her know as soon as you get results and look over them a nurse would give her a call.

## 2022-09-05 ENCOUNTER — Other Ambulatory Visit: Payer: Self-pay

## 2022-09-05 ENCOUNTER — Encounter: Payer: Self-pay | Admitting: Physical Therapy

## 2022-09-05 ENCOUNTER — Ambulatory Visit: Payer: 59 | Attending: Nurse Practitioner | Admitting: Physical Therapy

## 2022-09-05 DIAGNOSIS — M542 Cervicalgia: Secondary | ICD-10-CM | POA: Diagnosis present

## 2022-09-05 DIAGNOSIS — M62838 Other muscle spasm: Secondary | ICD-10-CM | POA: Diagnosis not present

## 2022-09-05 DIAGNOSIS — R293 Abnormal posture: Secondary | ICD-10-CM | POA: Diagnosis present

## 2022-09-05 DIAGNOSIS — M5481 Occipital neuralgia: Secondary | ICD-10-CM | POA: Insufficient documentation
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
## 2022-09-11 ENCOUNTER — Other Ambulatory Visit: Payer: Self-pay | Admitting: Endocrinology

## 2022-09-12 ENCOUNTER — Encounter: Payer: 59 | Admitting: Physical Therapy

## 2022-09-16 ENCOUNTER — Ambulatory Visit: Payer: 59 | Admitting: Physical Therapy

## 2022-09-16 NOTE — Telephone Encounter (Signed)
LVM for pt stating pt has missed appt / did not show for appt today.  Reminded pt of attendance policy for cancellation and for today's No show appt.  Pt reminded of future appt on 09-18-22 with Fransisco Hertz at 2:00 PM  Garen Lah, PT, [facility] Certified Exercise Expert for the Aging Adult  09/16/22 3:29 PM Phone: [phone] Fax: [phone]
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
## 2022-09-18 ENCOUNTER — Encounter: Payer: Self-pay | Admitting: Physical Therapy

## 2022-09-18 ENCOUNTER — Ambulatory Visit: Payer: 59 | Admitting: Physical Therapy

## 2022-09-18 DIAGNOSIS — M542 Cervicalgia: Secondary | ICD-10-CM | POA: Diagnosis not present

## 2022-09-18 DIAGNOSIS — R293 Abnormal posture: Secondary | ICD-10-CM

## 2022-09-24 ENCOUNTER — Ambulatory Visit: Payer: 59 | Admitting: Physical Therapy

## 2022-09-24 ENCOUNTER — Encounter: Payer: Self-pay | Admitting: Physical Therapy

## 2022-09-24 DIAGNOSIS — M542 Cervicalgia: Secondary | ICD-10-CM | POA: Diagnosis not present

## 2022-09-24 DIAGNOSIS — R293 Abnormal posture: Secondary | ICD-10-CM
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
## 2022-09-24 NOTE — Patient Instructions (Addendum)

## 2022-09-25 ENCOUNTER — Encounter: Payer: Self-pay | Admitting: Nurse Practitioner

## 2022-09-25 DIAGNOSIS — M62838 Other muscle spasm: Secondary | ICD-10-CM | POA: Insufficient documentation

## 2022-09-25 DIAGNOSIS — M5481 Occipital neuralgia: Secondary | ICD-10-CM | POA: Insufficient documentation
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
## 2022-09-26 ENCOUNTER — Ambulatory Visit: Payer: 59 | Attending: Nurse Practitioner | Admitting: Physical Therapy

## 2022-09-26 ENCOUNTER — Encounter: Payer: Self-pay | Admitting: Physical Therapy

## 2022-09-26 DIAGNOSIS — M542 Cervicalgia: Secondary | ICD-10-CM

## 2022-09-26 DIAGNOSIS — R293 Abnormal posture: Secondary | ICD-10-CM | POA: Diagnosis present

## 2022-09-30 ENCOUNTER — Ambulatory Visit: Payer: 59 | Admitting: Physical Therapy

## 2022-10-03 ENCOUNTER — Ambulatory Visit: Payer: 59 | Admitting: Physical Therapy

## 2022-10-07 ENCOUNTER — Telehealth: Payer: Self-pay | Admitting: Physical Therapy

## 2022-10-07 ENCOUNTER — Ambulatory Visit: Payer: 59 | Admitting: Physical Therapy

## 2022-10-07 NOTE — Telephone Encounter (Signed)
Left voicemail regarding no-show to appointment. Left next appointment date and time.
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
## 2022-10-09 ENCOUNTER — Encounter: Payer: Self-pay | Admitting: Physical Therapy

## 2022-10-09 ENCOUNTER — Ambulatory Visit: Payer: 59 | Admitting: Physical Therapy

## 2022-10-09 DIAGNOSIS — R293 Abnormal posture: Secondary | ICD-10-CM

## 2022-10-09 DIAGNOSIS — M542 Cervicalgia: Secondary | ICD-10-CM | POA: Diagnosis not present

## 2022-10-11 ENCOUNTER — Other Ambulatory Visit: Payer: 59
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
## 2022-10-14 ENCOUNTER — Ambulatory Visit: Payer: 59 | Admitting: Physical Therapy

## 2022-10-14 ENCOUNTER — Encounter: Payer: Self-pay | Admitting: Physical Therapy

## 2022-10-14 DIAGNOSIS — R293 Abnormal posture: Secondary | ICD-10-CM

## 2022-10-14 DIAGNOSIS — M542 Cervicalgia: Secondary | ICD-10-CM

## 2022-10-15 ENCOUNTER — Ambulatory Visit: Payer: 59 | Admitting: Physical Therapy

## 2022-10-15 ENCOUNTER — Encounter: Payer: Self-pay | Admitting: Physical Therapy

## 2022-10-15 DIAGNOSIS — M542 Cervicalgia: Secondary | ICD-10-CM

## 2022-10-15 DIAGNOSIS — R293 Abnormal posture: Secondary | ICD-10-CM
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
## 2022-10-16 ENCOUNTER — Ambulatory Visit: Payer: 59 | Admitting: Physical Therapy

## 2022-10-18 ENCOUNTER — Ambulatory Visit: Payer: 59 | Admitting: Endocrinology

## 2022-10-22 ENCOUNTER — Ambulatory Visit: Payer: 59 | Admitting: Physical Therapy

## 2022-10-22 ENCOUNTER — Other Ambulatory Visit: Payer: Self-pay

## 2022-10-22 ENCOUNTER — Encounter: Payer: Self-pay | Admitting: Physical Therapy

## 2022-10-22 DIAGNOSIS — R293 Abnormal posture: Secondary | ICD-10-CM

## 2022-10-22 DIAGNOSIS — M542 Cervicalgia: Secondary | ICD-10-CM

## 2022-10-22 NOTE — Therapy (Addendum)
OUTPATIENT PHYSICAL THERAPY TREATMENT + NO VISIT DISCHARGE SUMMARY (see below)    Patient Name: Victoria Padilla MRN: [id number] DOB:08/20/1965, 57 y.o., female Today's Date: 10/22/2022  END OF SESSION:  PT End of Session - 10/22/22 1449     Visit Number 8    Number of Visits 13    Date for PT Re-Evaluation 11/11/22    Authorization Type [employer]    Progress Note Due on Visit 10    PT Start Time 1400    PT Stop Time 1444    PT Time Calculation (min) 44 min    Activity Tolerance Patient tolerated treatment well    Behavior During Therapy [facility] for tasks assessed/performed                    Past Medical History:  Diagnosis Date   Acid reflux    Acute non-recurrent frontal sinusitis 06/17/2022   Anemia    Arthritis    Asthma    Cervical dysplasia    Diabetes mellitus    DVT (deep venous thrombosis) (HCC)    Gallbladder problem    GERD (gastroesophageal reflux disease)    Heart murmur    Hx of blood clots    Hyperlipidemia    Hypertension    LBP (low back pain)    Liver problem    Obesity    Orthopnea 02/13/2018   OSA (obstructive sleep apnea) 03/22/2018   New diagnosis. Mild.    Personal history of noncompliance with medical treatment, presenting hazards to health 12/21/2015   Pulmonary embolism (HCC)    Rash and nonspecific skin eruption 06/17/2022   Reactive airway disease, mild intermittent, with acute exacerbation 06/17/2022   Sarcoidosis    Sleep apnea    Vitamin D deficiency    Past Surgical History:  Procedure Laterality Date   BREAST BIOPSY Left 2019   BUNIONECTOMY     CHOLECYSTECTOMY     COLPOSCOPY     GYNECOLOGIC CRYOSURGERY     KNEE ARTHROSCOPY     TUBAL LIGATION     Patient Active Problem List   Diagnosis Date Noted   Bilateral occipital neuralgia 09/25/2022   Muscle spasms of neck 09/25/2022   Microalbuminuria due to type 2 diabetes mellitus (HCC) 07/18/2022   Diabetes (HCC) 12/09/2020   Pulmonary embolism (HCC) 08/15/2018   Weight  gain 08/03/2018   OSA (obstructive sleep apnea) 03/22/2018   Family history of heart disease in female family member before age 90 02/13/2018   GERD (gastroesophageal reflux disease) 12/21/2015   Morbid obesity (HCC) 03/03/2015   History of anemia 03/03/2015   Hyperlipidemia associated with type 2 diabetes mellitus (HCC) 03/03/2015   Benign essential HTN 03/03/2015   Arthritis    Sarcoidosis     PCP: Tollie Eth, NP  REFERRING PROVIDER: Tollie Eth, NP  REFERRING DIAG: M54.2 (ICD-10-CM) - Neck pain, bilateral M62.838 (ICD-10-CM) - Muscle spasms of neck M54.81 (ICD-10-CM) - Bilateral occipital neuralgia  THERAPY DIAG:  Cervicalgia  Abnormal posture  Rationale for Evaluation and Treatment: Rehabilitation  ONSET DATE: May 9th 2024  SUBJECTIVE:  SUBJECTIVE STATEMENT: Patient reports bilateral neck and pain also pain in the front of the right shoulder. Denies headache this visit.   PERTINENT HISTORY:  HTN, PE,  OSA, GERD, DM2  PAIN:  Are you having pain: 0/10 headache, 5/10 anterior shoulder pain Location/description: R sided neck pain, headache in R ram's horn distribution including ear Best worst over past week: up to 8/10 with shoulder, 9/10 headache  Per eval -  Best-worst over past week: 5-10/10  - aggravating factors: turning head, sitting after a few hours - Easing factors: medication, rest (hasn't tried ice, heat, or massage)  PRECAUTIONS: hx PE   WEIGHT BEARING RESTRICTIONS: No  FALLS:  Has patient fallen in last 6 months? No  LIVING ENVIRONMENT: Lives in 1 story home with adult children and grandchildren Housework split pretty evenly 4STE vs no STE  OCCUPATION: Systems developer for [employer], mostly sedentary work  PLOF: Independent  PATIENT GOALS: reduce pain and  headaches   OBJECTIVE: (objective measures completed at initial evaluation unless otherwise dated)  DIAGNOSTIC FINDINGS:  08/29/22 Cervical XR (refer to EPIC for details) "IMPRESSION: Loss of normal cervical lordosis. Slight levoconvex curvature of the lower cervical spine."  PATIENT SURVEYS:  FOTO 46 current, 62 predicted 10/14/22: 60    COGNITION: Overall cognitive status: Within functional limits for tasks assessed  SENSATION/NEURO: Light touch intact BIL UE, mildly diminished L C5-6 compared to R Finger<>nose testing unremarkable BIL No dysdiadochokinesia No clonus either LE  Negative hoffmann and tromner sign BIL UE  No ataxia with gait  POSTURE: guarded posture, upper trap elevation bilaterally  PALPATION: Concordant tenderness R levator scap, upper trap, infraspinatus  CERVICAL ROM:   Active ROM A/PROM (deg) eval A/PROM 09-24-22 AROM 09/26/22 AROM 10/14/22   AROM 10/22/2022  Flexion 25% *  100% painless  100% s   Extension 100%   100% painless   Right lateral flexion 75% *   75% painless   Left lateral flexion 50% *   75% painless    Right rotation 32 deg * 48 56 deg mild pain 58 deg mild pain 65 deg  Left rotation 30 deg * 52 60 deg 56 deg mild pain 70 deg   (Blank rows = not tested) (Key: WFL = within functional limits not formally assessed, * = concordant pain, s = stiffness/stretching sensation, NT = not tested)   UPPER EXTREMITY ROM:  A/PROM Right eval Left eval R/L 10/14/22  Shoulder flexion 130 deg * 152 deg  162 deg / 166 deg   Shoulder abduction     Shoulder internal rotation     Shoulder external rotation     Elbow flexion     Elbow extension     Wrist flexion     Wrist extension      (Blank rows = not tested) (Key: WFL = within functional limits not formally assessed, * = concordant pain, s = stiffness/stretching sensation, NT = not tested)  Comments:    UPPER EXTREMITY MMT:  MMT Right eval Left eval  Shoulder flexion    Shoulder extension     Shoulder abduction    Shoulder extension    Shoulder internal rotation    Shoulder external rotation    Elbow flexion    Elbow extension    Grip strength    (Blank rows = not tested)  (Key: WFL = within functional limits not formally assessed, * = concordant pain, s = stiffness/stretching sensation, NT = not tested)  Comments:   CERVICAL SPECIAL TESTS:  deferred  FUNCTIONAL TESTS:  Overhead reach: limited R vs L UE, painful  10/14/22: overhead reach painless and grossly symmetrical   TODAY'S TREATMENT:  [facility] Adult PT Treatment:                                                DATE: 10/22/2022 Therapeutic Exercise: UBE L1 x 4 min (fwd/bwd) while taking subjective Seated upper trap stretch 2 x 15 sec each Supine horizontal abduction with red 2 x 15 Supine chin tuck 2 x 10 Seated double ER and scap retraction with red 2 x 10 Sidelying thoracic rotation x 10 each  Supine dowel shoulder flexion with 3# x 10 Step-back stretch at counter 5 x 5 sec Row with green 2 x 15 Manual Therapy: Skilled palpation and monitoring of muscle tension while performing TPDN STM bilateral upper traps Trigger Point Dry Needling Treatment: Pre-treatment instruction: Patient instructed on dry needling rationale, procedures, and possible side effects including pain during treatment (achy,cramping feeling), bruising, drop of blood, lightheadedness, nausea, sweating. Patient Consent Given: Yes Education handout provided: No Muscles treated: Bilateral upper trap  Needle size and number: .30x43mm x 3 Electrical stimulation performed: No Parameters: N/A Treatment response/outcome: Twitch response elicited and Palpable decrease in muscle tension Post-treatment instructions: Patient instructed to expect possible mild to moderate muscle soreness later today and/or tomorrow. Patient instructed in methods to reduce muscle soreness and to continue prescribed HEP. If patient was dry needled over the lung field,  patient was instructed on signs and symptoms of pneumothorax and, however unlikely, to see immediate medical attention should they occur. Patient was also educated on signs and symptoms of infection and to seek medical attention should they occur. Patient verbalized understanding of these instructions and education.   [facility] Adult PT Treatment:                                                DATE: 10-15-22 Therapeutic Exercise: Shoulder rolls Upper Trapezius Stretch 2 reps - 30 hold Levator Scapulae Stretch 2 reps - 30 hold OH press 2 x 10 with 4# DB Bil Shoulder ext red band 2x10 cues for posture  Red band rows 2x10 cues for posture and elbow positioning  this exercise irritating to shld Red band double ER + scap retraction 2x10 Manual Therapy: STW bil UT/LS, cerebral paravertebral, sub occipital release R pectoral mx Gentle cervical distraction UPA from Right C- 3 to C-6 Trigger Point Dry-Needling performed     by Garen Lah Treatment instructions: Expect mild to moderate muscle soreness. S/S of pneumothorax if dry needled over a lung field, and to seek immediate medical attention should they occur. Patient verbalized understanding of these instructions and education.   Patient Consent Given: Yes Education handout provided: Previously provided Muscles treated:  R UT/LS and C-5/6, suboccipitals. Right pectoral mx  Electrical stimulation performed: No Parameters: N/A Treatment response/outcome: twitch response noted, pt noted relief Modalities: Moist hot pack  [facility] Adult PT Treatment:  DATE: 10/14/22 Therapeutic Exercise: Double ER + scapular retraction 2x12, unresisted cues for pacing  Seated UT stretch 2x30sec BIL  Therapeutic Activity: MSK assessment + education FOTO + education Education/discussion re: progress with PT, symptom behavior as it affects activity tolerance, PT goals/POC   [facility] Adult PT Treatment:                                                 DATE: 10/09/22 Therapeutic Exercise: Double ER + scapular retraction 2x12 cues for posture and pacing  Seated chin tuck 2x8 Red band row 2x12 cues for posture and elbow positioning Shoulder ext red band 2x10 cues for elbow compensations HEP update + education Manual Therapy: Seated; gentle STM to LS, UT, rhomboid, and infraspinatus on R  Modalities: Moist heat 10 min R neck/shoulder, seated   [facility] Adult PT Treatment:                                                DATE: 09/26/22 Therapeutic Exercise: Shoulder ext red band 2x10 cues for posture and pacing  Red band rows 2x10 cues for posture and elbow positioning Red band double ER + scap retraction 2x10 LS stretch 2x30sec BIL cues for breath control and setup  HEP review + education Therapeutic Activity: MSK assessment + education Significant time spent w/ education on monitoring symptoms, symptom behavior since starting PT, gradual progression of activity with appropriate modifications as needed, relevant anatomy/physiology  as it pertains to activity tolerance in and out of sessions  [facility] Adult PT Treatment:                                                DATE: 09-24-22 Therapeutic Exercise: Shoulder rolls Upper Trapezius Stretch 3 reps - 30 hold Levator Scapulae Stretch 3 reps - 30 hold Supine Deep Neck Flexor Training 10 reps - 3 sec hold Shoulder E R and Scapular Retraction with RTB   3 x 10 reps Shoulder extension with resistance - 3  x 10 reps Manual Therapy: STW bil UT/LS, cerebral paravertebral, sub occipital release Gentle cervical distraction UPA from Right C- 3 to C-6 Trigger Point Dry-Needling performed     by Garen Lah Treatment instructions: Expect mild to moderate muscle soreness. S/S of pneumothorax if dry needled over a lung field, and to seek immediate medical attention should they occur. Patient verbalized understanding of these instructions and education.  Patient Consent Given:  Yes Education handout provided: Previously provided Muscles treated:  R UT/LS and C-5/6 Electrical stimulation performed: No Parameters: N/A Treatment response/outcome: twitch response noted, pt noted relief Modalities: Moist hot pack  [facility] Adult PT Treatment:                                                DATE: 09/18/22 Therapeutic Exercise: Scap retractions x10 cues for form and posture, comfortable ROM  Shoulder rolls fwd/back 2x10 each way  HEP handout + education, significant time spent with education on relevant anatomy/physiology as pertains to palpation exam and symptom behavior, monitoring symptoms with exercise program Manual Therapy: Gentle STM to R UT, LS, infraspinatus, deltoid; seated  PATIENT EDUCATION:  Education details: HEP Person educated: Patient Education method: Explanation, Demonstration, Tactile cues, Verbal cues Education comprehension: verbalized understanding, returned demonstration, verbal cues required, tactile cues required, and needs further education    HOME EXERCISE PROGRAM: Access Code: [email or web address] URL: [email or web address] Date: 10/09/2022 Prepared by: Fransisco Hertz  Exercises - Seated Gentle Upper Trapezius Stretch  - 1 x daily - 7 x weekly - 1 sets - 3 reps - 30 hold - Gentle Levator Scapulae Stretch  - 1 x daily - 7 x weekly - 3 sets - 3 reps - 30 hold - Shoulder External Rotation and Scapular Retraction with Resistance  - 1-2 x daily - 7 x weekly - 3 sets - 10 reps - 3-5 sec hold - Shoulder extension with resistance - Neutral  - 1 x daily - 7 x weekly - 3 sets - 10 reps - Standing Shoulder Row with Anchored Resistance  - 1 x daily - 7 x weekly - 3 sets - 10 reps Added 10-15-22 - Single Arm Doorway Pec Stretch at 90 Degrees Abduction  - 1 x daily - 7 x weekly - 1 sets - 3 reps - 20-30 sec hold   ASSESSMENT: CLINICAL  IMPRESSION: Patient tolerated therapy well with no adverse effects. Therapy continued with TPDN for the bilateral upper traps with patient reporting soreness post treatment. She does exhibit improved cervical rotation this visit. Therapy focused on progressing postural control with good tolerance. She does require cueing to avoid over utilization of upper traps with periscapular strengthening. No changes made to HEP this visit. Patient would benefit from continued skilled PT to progress her mobility and strength in order to reduce pain and maximize functional ability.   OBJECTIVE IMPAIRMENTS: decreased activity tolerance, decreased endurance, decreased mobility, decreased ROM, impaired UE functional use, postural dysfunction, and pain.   ACTIVITY LIMITATIONS: carrying, sitting, sleeping, and reach over head  PARTICIPATION LIMITATIONS: meal prep, cleaning, laundry, driving, and occupation  PERSONAL FACTORS: Time since onset of injury/illness/exacerbation and 3+ comorbidities: HTN, PE,  OSA, GERD, DM2  are also affecting patient's functional outcome.   REHAB POTENTIAL: Good  CLINICAL DECISION MAKING: Evolving/moderate complexity  EVALUATION COMPLEXITY: Moderate   GOALS: Goals reviewed with patient? No  SHORT TERM GOALS: Target date: 09/26/2022 Pt will demonstrate appropriate understanding and performance of initially prescribed HEP in order to facilitate improved independence with management of symptoms.  Baseline: HEP established 09/18/22 09/26/22: good HEP adherence reported overall Goal status: MET   2. Pt will score greater than or equal to 54 on FOTO in order to demonstrate improved perception of function due to symptoms.  Baseline: 46  09/26/22: deferred given visit 4   10/14/22: 60   Goal status: MET   LONG TERM GOALS: Target date: 11/11/2022  (Updated 10/14/22 with recertification)   Pt will score 62 on FOTO in order to demonstrate  improved perception of function due to  symptoms. Baseline: 46 10/14/22: 60  Goal status: PROGRESSING  2. Pt will demonstrate at least 45 degrees of active cervical rotation ROM bilaterally in order to demonstrate improved environmental awareness and safety with driving.  Baseline: see ROM chart above 10/14/22: >50 deg bilaterally Goal status: MET  3. Pt will demonstrate active right shoulder flexion ROM of at least 150 deg in order to facilitate improved functional reaching.  Baseline: see MMT chart above 10/14/22: >160 deg BIL  Goal status: MET   4. Pt will report at least 50% decrease in overall pain levels in past week in order to facilitate improved tolerance to basic ADLs/mobility.   Baseline: 5-10/10  10/14/22: 0-8/10 shoulder pain, 0-9/10 headache   Goal status: PROGRESSING    5. Pt will demonstrate appropriate performance of final prescribed HEP in order to facilitate improved self-management of symptoms post-discharge.   Baseline: HEP established 09/18/22  10/14/22: reports good HEP adherence   Goal status: ONGOING     PLAN: (updated 10/14/22)  PT FREQUENCY: 2x/week  PT DURATION: 4 weeks  PLANNED INTERVENTIONS: Therapeutic exercises, Therapeutic activity, Neuromuscular re-education, Balance training, Gait training, Patient/Family education, Self Care, Joint mobilization, Aquatic Therapy, Dry Needling, Spinal mobilization, Cryotherapy, Moist heat, Taping, Manual therapy, and Re-evaluation  PLAN FOR NEXT SESSION: continuing monitoring for neuro symptoms. Pt reports benefit from prior use of dry needling - consider PRN as indicated/appropriate. Otherwise continue to work on gradual progression of periscapular/cervical stability work within pt tolerance.      Rosana Hoes, PT, DPT, LAT, ATC 10/22/22  2:52 PM Phone: [phone] Fax: [phone]    Discharge addendum 01/09/2023  PHYSICAL THERAPY DISCHARGE SUMMARY  Visits from Start of Care: 8  Current functional level related to goals / functional  outcomes: Unable to be assessed   Remaining deficits: Unable to be assessed   Education / Equipment: Unable to be assessed  Patient goals were unable to be assessed. Patient is being discharged due to not returning since the last visit.   Ashley Murrain PT, DPT 01/09/2023 1:53 PM

## 2022-10-24 ENCOUNTER — Ambulatory Visit: Payer: 59 | Admitting: Physical Therapy

## 2022-10-24 ENCOUNTER — Telehealth: Payer: Self-pay | Admitting: Physical Therapy

## 2022-10-24 NOTE — Telephone Encounter (Signed)
Called pt re: this afternoon's missed appt - unable to reach, left voicemail with office call back number, confirmed date/time of next appt. Also advised on attendance policy with second missed appt per chart review. Encouraged to reach out with any questions or concerns

## 2022-10-30 ENCOUNTER — Ambulatory Visit: Payer: 59 | Admitting: Physical Therapy

## 2022-10-30 ENCOUNTER — Telehealth: Payer: Self-pay | Admitting: Physical Therapy

## 2022-10-30 NOTE — Telephone Encounter (Signed)
Called pt re: this afternoon's missed appt - left voicemail, advised her that unfortunately with this being third missed appt, per our attendance policy we will have to cancel tomorrow's visit and will require a new physician referral to continue treatment. Provided her with office call back number and encouraged her to reach out with any questions or concerns.

## 2022-10-31 ENCOUNTER — Ambulatory Visit: Payer: 59 | Admitting: Physical Therapy

## 2022-11-07 ENCOUNTER — Other Ambulatory Visit (INDEPENDENT_AMBULATORY_CARE_PROVIDER_SITE_OTHER): Payer: 59

## 2022-11-07 DIAGNOSIS — E785 Hyperlipidemia, unspecified: Secondary | ICD-10-CM | POA: Diagnosis not present

## 2022-11-07 DIAGNOSIS — Z794 Long term (current) use of insulin: Secondary | ICD-10-CM | POA: Diagnosis not present

## 2022-11-07 DIAGNOSIS — E1165 Type 2 diabetes mellitus with hyperglycemia: Secondary | ICD-10-CM | POA: Diagnosis not present

## 2022-11-07 DIAGNOSIS — E1169 Type 2 diabetes mellitus with other specified complication: Secondary | ICD-10-CM | POA: Diagnosis not present

## 2022-11-07 DIAGNOSIS — E1129 Type 2 diabetes mellitus with other diabetic kidney complication: Secondary | ICD-10-CM

## 2022-11-08 LAB — BASIC METABOLIC PANEL
BUN: 12 mg/dL (ref 6–23)
CO2: 27 meq/L (ref 19–32)
Calcium: 9.7 mg/dL (ref 8.4–10.5)
Chloride: 102 meq/L (ref 96–112)
Creatinine, Ser: 0.8 mg/dL (ref 0.40–1.20)
GFR: 80.63 mL/min (ref >60.00)
Glucose, Bld: 106 mg/dL — ABNORMAL HIGH (ref 70–99)
Potassium: 4.2 meq/L (ref 3.5–5.1)
Sodium: 137 meq/L (ref 135–145)

## 2022-11-08 LAB — LIPID PANEL
Cholesterol: 203 mg/dL — ABNORMAL HIGH (ref 0–200)
HDL: 61.6 mg/dL (ref >39.00)
LDL Cholesterol: 113 mg/dL — ABNORMAL HIGH (ref 0–99)
NonHDL: 141.38
Total CHOL/HDL Ratio: 3
Triglycerides: 144 mg/dL (ref 0.0–149.0)
VLDL: 28.8 mg/dL (ref 0.0–40.0)

## 2022-11-08 LAB — HEMOGLOBIN A1C: Hgb A1c MFr Bld: 9.3 % — ABNORMAL HIGH (ref 4.6–6.5)

## 2022-11-08 NOTE — Addendum Note (Signed)
Addended by: Ninfa Meeker A on: 11/08/2022 04:00 PM   Modules accepted: Orders

## 2022-11-09 LAB — MICROALBUMIN / CREATININE URINE RATIO
Creatinine, Urine: 85 mg/dL (ref 20–275)
Microalb Creat Ratio: 334 mg/g{creat} — ABNORMAL HIGH (ref <30)
Microalb, Ur: 28.4 mg/dL

## 2022-11-14 ENCOUNTER — Ambulatory Visit: Payer: 59 | Admitting: Endocrinology

## 2022-11-14 ENCOUNTER — Encounter: Payer: Self-pay | Admitting: Endocrinology

## 2022-11-14 VITALS — BP 160/90 | HR 95 | Resp 16 | Ht 63.0 in | Wt 244.2 lb

## 2022-11-14 DIAGNOSIS — R809 Proteinuria, unspecified: Secondary | ICD-10-CM | POA: Diagnosis not present

## 2022-11-14 DIAGNOSIS — Z794 Long term (current) use of insulin: Secondary | ICD-10-CM

## 2022-11-14 DIAGNOSIS — E11319 Type 2 diabetes mellitus with unspecified diabetic retinopathy without macular edema: Secondary | ICD-10-CM | POA: Diagnosis not present

## 2022-11-14 DIAGNOSIS — Z7985 Long-term (current) use of injectable non-insulin antidiabetic drugs: Secondary | ICD-10-CM

## 2022-11-14 DIAGNOSIS — E1165 Type 2 diabetes mellitus with hyperglycemia: Secondary | ICD-10-CM | POA: Diagnosis not present

## 2022-11-14 MED ORDER — BASAGLAR KWIKPEN 100 UNIT/ML ~~LOC~~ SOPN: 40.0000 [IU] | PEN_INJECTOR | SUBCUTANEOUS | 3 refills | Status: AC

## 2022-11-14 MED ORDER — TIRZEPATIDE 10 MG/0.5ML ~~LOC~~ SOAJ: 10.0000 mg | SUBCUTANEOUS | 3 refills | Status: AC

## 2022-11-14 NOTE — Patient Instructions (Addendum)
Increase mounjaro to 10 mg weekly. [employer] Scientist, water quality same dose.   Check glucose at least time a day in the morning fasting and at bedtime. Bring meter.   Work on diet and exercise.

## 2022-11-14 NOTE — Progress Notes (Signed)
Outpatient Endocrinology Note [place] Kajol Crispen, MD  11/14/22  Patient's Name: Victoria Padilla    DOB: [birth year]    MRN: [id number]                                                    REASON OF VISIT: Follow up for evaluation of type 2 diabetes mellitus  PCP: Early, Sung Amabile, NP  HISTORY OF PRESENT ILLNESS:   Victoria Padilla is a 57 y.o. old female with past medical history listed below, is here for follow up for type 2 diabetes mellitus.   Pertinent Diabetes History: Patient was diagnosed with type 2 diabetes mellitus in 1998.  She has mostly uncontrolled type 2 diabetes mellitus with hemoglobin A1c in the range of 7.3 to 11.1%.  Chronic Diabetes Complications : Retinopathy: yes. Last ophthalmology exam was done on annually.  Nephropathy: yes, microalbuminuria present, on benzapril Peripheral neuropathy: no Coronary artery disease: no Stroke: no  Relevant comorbidities and cardiovascular risk factors: Obesity: yes Body mass index is 43.26 kg/m.  Hypertension: yes Hyperlipidemia. Yes, on a statin.  Current / Home Diabetic regimen includes: Basaglar 40 units in the morning. Mounjaro 7.5 mg weekly. Sunday. Xigduo XR 06-998 mg 2 tab daily.   Prior diabetic medications: Jardiance, glipizide, Trulicity, Farxiga, metformin, Januvia.  Glycemic data:   CGM was cost prohibitive in the past.  He uses Accu-Chek glucometer, forget to bring the glucometer in the clinic today.  By recall she mentioned she mostly check in the morning blood sugar mostly around 150 or less.  Some of the time she has blood sugar in 100s and one of the time she had blood sugars 70 few days ago.  Hypoglycemia: Patient has no hypoglycemic episodes. Patient has hypoglycemia awareness.  Factors modifying glucose control: 1.  Diabetic diet assessment: 2-3 meals a day.  Sometimes fries.  2.  Staying active or exercising: Walking at home and yard.  3.  Medication compliance: compliant most of the  time.  Interval history 11/14/22 She has been trying some natural supplement for diabetes care in the last few months.  However she reports compliance with her diabetic medications.  Hemoglobin A1c today 9.3% worsening. She occasionally gets stomach pain after Mounjaro injection.  Otherwise denies nausea and vomiting.  Recent labs reviewed.  She is not fully compliant with simvastatin LDL is still high.  She wants to be more compliant with it.   She has not taken blood pressure medicine.  Discussed about taking regularly asked to monitor, can do in the local pharmacy and if it is high asked to talk with her primary care provider.  REVIEW OF SYSTEMS As per history of present illness.   PAST MEDICAL HISTORY: Past Medical History:  Diagnosis Date   Acid reflux    Acute non-recurrent frontal sinusitis 06/17/2022   Anemia    Arthritis    Asthma    Cervical dysplasia    Diabetes mellitus    DVT (deep venous thrombosis) (HCC)    Gallbladder problem    GERD (gastroesophageal reflux disease)    Heart murmur    Hx of blood clots    Hyperlipidemia    Hypertension    LBP (low back pain)    Liver problem    Obesity    Orthopnea 02/13/2018   OSA (obstructive sleep apnea) 03/22/2018  New diagnosis. Mild.    Personal history of noncompliance with medical treatment, presenting hazards to health 12/21/2015   Pulmonary embolism (HCC)    Rash and nonspecific skin eruption 06/17/2022   Reactive airway disease, mild intermittent, with acute exacerbation 06/17/2022   Sarcoidosis    Sleep apnea    Vitamin D deficiency     PAST SURGICAL HISTORY: Past Surgical History:  Procedure Laterality Date   BREAST BIOPSY Left 2019   BUNIONECTOMY     CHOLECYSTECTOMY     COLPOSCOPY     GYNECOLOGIC CRYOSURGERY     KNEE ARTHROSCOPY     TUBAL LIGATION      ALLERGIES: Allergies  Allergen Reactions   Naprosyn [Naproxen] Nausea Only    FAMILY HISTORY:  Family History  Problem Relation Age of  Onset   Heart failure Mother 67       chf   Diabetes Mother    High blood pressure Mother    Heart disease Mother    Sudden death Mother    Kidney disease Mother    Obesity Mother    Heart failure Father    Heart disease Father    Sudden death Father    Alcoholism Father    Cancer Brother 43       prostate   Hypertension Maternal Uncle    Hyperlipidemia Brother    Diabetes Brother    Colon cancer Maternal Grandfather    Colon polyps Brother    Diabetes Brother    Hyperlipidemia Brother    Stomach cancer Neg Hx    Esophageal cancer Neg Hx    Rectal cancer Neg Hx    Liver cancer Neg Hx    Breast cancer Neg Hx     SOCIAL HISTORY: Social History   Socioeconomic History   Marital status: Single    Spouse name: Not on file   Number of children: 3   Years of education: Not on file   Highest education level: Not on file  Occupational History   Occupation: Tax adviser: [employer]  Tobacco Use   Smoking status: Never   Smokeless tobacco: Never  Vaping Use   Vaping status: Never Used  Substance and Sexual Activity   Alcohol use: No   Drug use: No   Sexual activity: Not Currently    Birth control/protection: Surgical  Other Topics Concern   Not on file  Social History Narrative   Not on file   Social Determinants of Health   Financial Resource Strain: Not on file  Food Insecurity: Not on file  Transportation Needs: Not on file  Physical Activity: Not on file  Stress: Not on file  Social Connections: Not on file    MEDICATIONS:  Current Outpatient Medications  Medication Sig Dispense Refill   Albuterol-Budesonide (AIRSUPRA) 90-80 MCG/ACT AERO Inhale 1-2 Inhalations into the lungs every 4 (four) hours. As needed for cough, wheeze, shortness of breath. 10.7 g 0   amLODipine (NORVASC) 5 MG tablet Take 1 tablet (5 mg total) by mouth daily. 90 tablet 1   B-D ULTRAFINE III SHORT PEN 31G X 8 MM MISC USE TO INJECT BASAGLAR DAILY- NEED APPT  100 each 0   benazepril (LOTENSIN) 40 MG tablet Take 1 tablet (40 mg total) by mouth daily. 90 tablet 1   BESIVANCE 0.6 % SUSP      cetirizine (ZYRTEC) 10 MG tablet Take 10 mg by mouth daily.     cholecalciferol (VITAMIN D) 1000 units  tablet Take 2,000 Units by mouth at bedtime.     clobetasol ointment (TEMOVATE) 0.05 % APPLY TOPICALLY TWICE A DAY X 4 WEEKS, THEN ONCE A DAY X 4 WEEKS, THEN TWICE A WEEK. 30 g 2   Continuous Blood Gluc Sensor (FREESTYLE LIBRE 2 SENSOR) MISC 1 Device by Does not apply route every 14 (fourteen) days. 6 each 3   cyclobenzaprine (FLEXERIL) 10 MG tablet Take 0.5-1 tablets (5-10 mg total) by mouth 3 (three) times daily as needed for muscle spasms. May make you sleepy. Start at bedtime. 60 tablet 3   Dapagliflozin Pro-metFORMIN ER (XIGDUO XR) 06-998 MG TB24 Take 2 tablets by mouth daily. 60 tablet 2   diclofenac Sodium (VOLTAREN) 1 % GEL Apply 2 g topically 4 (four) times daily. 150 g 2   Fluticasone-Umeclidin-Vilant (TRELEGY ELLIPTA) 200-62.5-25 MCG/ACT AEPB Once a day. 14 each 0   glucose blood (FREESTYLE LITE) test strip 1 each by Other route 2 (two) times daily. And lancets 2/day 200 each 3   glucose blood (ONETOUCH VERIO) test strip Use as instructed to check blood sugar 2X daily 100 each 0   hydrochlorothiazide (HYDRODIURIL) 25 MG tablet TAKE 1 TABLET BY MOUTH DAILY *NEED APPOINTMENT FOR REFILLS* 90 tablet 1   levocetirizine (XYZAL) 2.5 MG/5ML solution Take 10 mLs (5 mg total) by mouth every evening. 148 mL 12   metoprolol tartrate (LOPRESSOR) 25 MG tablet TAKE 1 TABLET BY MOUTH TWICE A DAY 180 tablet 0   mometasone (ELOCON) 0.1 % cream Apply a thin layer to the skin once a day for rash. 15 g 1   Olopatadine HCl 0.2 % SOLN Place 2-4 drops in the eyes every 4-6 hours as needed for allergy symptoms. 2.5 mL 3   Omega-3 Fatty Acids (FISH OIL PO) Take 1 capsule by mouth at bedtime. Reported on 08/08/2015     omeprazole (PRILOSEC) 40 MG capsule TAKE 1 CAPSULE BY MOUTH EVERY  DAY 30 capsule 0   ondansetron (ZOFRAN) 4 MG tablet Take 4 mg by mouth every 8 (eight) hours as needed for nausea or vomiting.     rivaroxaban (XARELTO) 10 MG TABS tablet TAKE 1 TABLET (10 MG TOTAL) BY MOUTH DAILY. NEEDS APPOINTMENT FOR REFILLS 90 tablet 3   simvastatin (ZOCOR) 20 MG tablet TAKE 1 TABLET BY MOUTH EVERYDAY AT BEDTIME 90 tablet 1   tirzepatide (MOUNJARO) 10 MG/0.5ML Pen Inject 10 mg into the skin once a week. 6 mL 3   Insulin Glargine (BASAGLAR KWIKPEN) 100 UNIT/ML Inject 40 Units into the skin every morning. 45 mL 3   No current facility-administered medications for this visit.    PHYSICAL EXAM: Vitals:   11/14/22 1543 11/14/22 1605  BP: (!) 170/90 (!) 160/90  Pulse: 95   Resp: 16   SpO2: 99%   Weight: 244 lb 3.2 oz (110.8 kg)   Height: 5\' 3"  (1.6 m)    Body mass index is 43.26 kg/m.  Wt Readings from Last 3 Encounters:  11/14/22 244 lb 3.2 oz (110.8 kg)  08/20/22 243 lb 6.4 oz (110.4 kg)  07/18/22 243 lb (110.2 kg)    General: Well developed, well nourished female in no apparent distress.  HEENT: AT/[place], no external lesions.  Eyes: Conjunctiva clear and no icterus. Neck: Neck supple  Lungs: Respirations not labored Neurologic: Alert, oriented, normal speech Extremities / Skin: Dry. No sores or rashes noted.  Psychiatric: Does not appear depressed or anxious  Diabetic Foot Exam - Simple   No data filed  LABS Reviewed Lab Results  Component Value Date   HGBA1C 9.3 (H) 11/07/2022   HGBA1C 8.0 (H) 07/16/2022   HGBA1C 9.1 (H) 01/30/2022   Lab Results  Component Value Date   FRUCTOSAMINE 374 (H) 11/04/2018   FRUCTOSAMINE 310 (H) 09/02/2018   Lab Results  Component Value Date   CHOL 203 (H) 11/07/2022   HDL 61.60 11/07/2022   LDLCALC 113 (H) 11/07/2022   LDLDIRECT 62.0 01/30/2022   TRIG 144.0 11/07/2022   CHOLHDL 3 11/07/2022   Lab Results  Component Value Date   MICRALBCREAT 334 (H) 11/08/2022   MICRALBCREAT 124.1 (H) 11/22/2021   Lab  Results  Component Value Date   CREATININE 0.80 11/07/2022   Lab Results  Component Value Date   GFR 80.63 11/07/2022    ASSESSMENT / PLAN  1. Uncontrolled type 2 diabetes mellitus with hyperglycemia, with long-term current use of insulin (HCC)   2. Type 2 diabetes mellitus with retinopathy, with long-term current use of insulin, macular edema presence unspecified, unspecified laterality, unspecified retinopathy severity (HCC)   3. Long-term current use of injectable noninsulin antidiabetic medication   4. Microalbuminuria     Diabetes Mellitus type 2, complicated by diabetic retinopathy, microalbuminuria - Diabetic status / severity: Uncontrolled.  Lab Results  Component Value Date   HGBA1C 9.3 (H) 11/07/2022    - Hemoglobin A1c goal : <7%  Discussed about compliance with diabetic medication.  Discussed about diet plan and exercise.  Patient asked to stop natural supplement which she is trying at this time and sugar and diabetes is worsening.  With reported blood sugars sometimes up to 70 I would not increase the dose of Basaglar.  - Medications: See below  I) continue Basaglar 40 units daily. II) increase Mounjaro from 7.5 to 10 mg weekly. III) continue Xigduo extended release 2 tablets daily.  - Home glucose testing: Check blood sugar in the morning fasting and at bedtime at least 2 times a day and sometime in the afternoon. - Discussed/ Gave Hypoglycemia treatment plan.  # Consult : not required at this time.   # Annual urine for microalbuminuria/ creatinine ratio, no microalbuminuria currently, continue ACE/ARB /benazepril.  On dapagliflozin. Last  Lab Results  Component Value Date   MICRALBCREAT 334 (H) 11/08/2022   -Microalbuminuria is worsening likely related with uncontrolled diabetes mellitus.  Expect to improve with improvement on diabetes.  # Foot check nightly.  # Annual dilated diabetic eye exams.   - Diet: Make healthy diabetic food choices - Life  style / activity / exercise: Discussed.  2. Blood pressure  -  BP Readings from Last 1 Encounters:  11/14/22 (!) 160/90    - Control is not in target. Patient reports she has not taken blood pressure medicine.  Advised to be compliant and continue on antihypertensive medication.  Advised to check blood sugar at home or nearby pharmacy and if remained high asked to discuss with primary care provider.  She is asymptomatic today. - No change in current plans.  3. Lipid status / Hyperlipidemia - Last  Lab Results  Component Value Date   LDLCALC 113 (H) 11/07/2022   - Continue simvastatin 20 mg daily.  Patient reports he is not fully compliant.  Discussed about changing to different statin however she want to stay on current simvastatin.  Diagnoses and all orders for this visit:  Uncontrolled type 2 diabetes mellitus with hyperglycemia, with long-term current use of insulin (HCC)  Type 2 diabetes mellitus with retinopathy, with long-term  current use of insulin, macular edema presence unspecified, unspecified laterality, unspecified retinopathy severity (HCC)  Long-term current use of injectable noninsulin antidiabetic medication  Microalbuminuria  Other orders -     tirzepatide (MOUNJARO) 10 MG/0.5ML Pen; Inject 10 mg into the skin once a week. -     Insulin Glargine (BASAGLAR KWIKPEN) 100 UNIT/ML; Inject 40 Units into the skin every morning.    DISPOSITION Follow up in clinic in 3 months suggested.   All questions answered and patient verbalized understanding of the plan.  [place] Yusef Lamp, MD [facility] [address] [place], [address] [place], [place] [address] Phone # [phone]  At least part of this note was generated using voice recognition software. Inadvertent word errors may have occurred, which were not recognized during the proofreading process.

## 2023-01-07 ENCOUNTER — Ambulatory Visit: Payer: 59 | Admitting: Nurse Practitioner

## 2023-01-07 ENCOUNTER — Encounter: Payer: Self-pay | Admitting: Nurse Practitioner

## 2023-01-07 VITALS — BP 140/90 | HR 78 | Wt 247.6 lb

## 2023-01-07 DIAGNOSIS — I2609 Other pulmonary embolism with acute cor pulmonale: Secondary | ICD-10-CM

## 2023-01-07 DIAGNOSIS — I152 Hypertension secondary to endocrine disorders: Secondary | ICD-10-CM | POA: Diagnosis not present

## 2023-01-07 DIAGNOSIS — E1159 Type 2 diabetes mellitus with other circulatory complications: Secondary | ICD-10-CM

## 2023-01-07 MED ORDER — VALSARTAN-HYDROCHLOROTHIAZIDE 160-25 MG PO TABS: 1.0000 | ORAL_TABLET | Freq: Every day | ORAL | 3 refills | Status: DC

## 2023-01-07 MED ORDER — RIVAROXABAN 10 MG PO TABS: ORAL_TABLET | ORAL | 3 refills | Status: DC

## 2023-01-07 MED ORDER — METOPROLOL SUCCINATE ER 50 MG PO TB24: 50.0000 mg | ORAL_TABLET | Freq: Every day | ORAL | 3 refills | Status: DC
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
## 2023-01-07 NOTE — Patient Instructions (Signed)
STOP Benazepril, hydrochlorothiazide, and the Metoprolol that you have at home.   START Valsartan-hydrochlorothiazide combo and new metoprolol.

## 2023-01-14 NOTE — Assessment & Plan Note (Signed)
Unprovoked blood clots in 2022 with pulmonary embolism and deep vein thrombosis. Currently not on anticoagulation due to prescription lapse. Discussed the need for continued anticoagulation therapy and potential clotting disorder. Explained increased risk of recurrence and importance of anticoagulation therapy. Considered genetic clotting disorder and need for further testing. - Restart Xarelto 10 mg once daily - Investigate insurance coverage for alternative anticoagulants - Consider testing for clotting disorders - Schedule physical exam for further evaluation
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
## 2023-02-06 ENCOUNTER — Ambulatory Visit: Payer: 59 | Admitting: Nurse Practitioner

## 2023-02-06 VITALS — BP 138/86 | HR 82 | Wt 250.0 lb

## 2023-02-06 DIAGNOSIS — R310 Gross hematuria: Secondary | ICD-10-CM

## 2023-02-06 DIAGNOSIS — R300 Dysuria: Secondary | ICD-10-CM | POA: Diagnosis not present

## 2023-02-06 DIAGNOSIS — R58 Hemorrhage, not elsewhere classified: Secondary | ICD-10-CM

## 2023-02-06 DIAGNOSIS — N898 Other specified noninflammatory disorders of vagina: Secondary | ICD-10-CM

## 2023-02-06 LAB — WET PREP FOR TRICH, YEAST, CLUE

## 2023-02-06 NOTE — Progress Notes (Signed)
   Acute Office Visit  Subjective:    Patient ID: Victoria Padilla, female    DOB: 01-14-1966, 57 y.o.   MRN: [id number]   HPI 57 y.o. presents today for odd sensation with urination and bleeding. Bleeding very light, only with wiping after urinating. No bleeding throughout the day. Unsure if vaginal or urinary. Has odd sensation with urinating that feels like intense itching after end of stream. Denies vaginal odor but has noticed some discharge. H/O urinary leakage but has been severe since this started 01/22/2023. Postmenopausal for a couple of years.   Patient's last menstrual period was 01/21/2023.    Review of Systems  Constitutional: Negative.   Genitourinary:  Positive for dysuria, hematuria, urgency, vaginal bleeding (Unsure), vaginal discharge and vaginal pain (Sense of itching with urination). Negative for difficulty urinating, flank pain, frequency and pelvic pain.       Incontinence       Objective:    Physical Exam Constitutional:      Appearance: Normal appearance.  Abdominal:     Tenderness: There is no right CVA tenderness or left CVA tenderness.  Genitourinary:    General: Normal vulva.     Vagina: Normal.     Cervix: Normal.     Uterus: Normal.      BP 138/86 (BP Location: Left Arm, Patient Position: Sitting, Cuff Size: Large)   Pulse 82   Wt 250 lb (113.4 kg)   LMP 01/21/2023   SpO2 100%   BMI 44.29 kg/m  Wt Readings from Last 3 Encounters:  02/06/23 250 lb (113.4 kg)  01/07/23 247 lb 9.6 oz (112.3 kg)  11/14/22 244 lb 3.2 oz (110.8 kg)        Patient informed chaperone available to be present for breast and/or pelvic exam. Patient has requested no chaperone to be present. Patient has been advised what will be completed during breast and pelvic exam.   Wet prep negative for pathogens UA: neg leukocytes, neg nitrites, 1+ protein, yellow/clear. Microscopic: wbc 0-5, rbc 0-2, few bacteria  Assessment & Plan:   Problem List Items Addressed This  Visit   None Visit Diagnoses       Vaginal itching    -  Primary   Relevant Orders   WET PREP FOR TRICH, YEAST, CLUE     Burning with urination       Relevant Orders   Urinalysis,Complete w/RFL Culture     Gross hematuria       Relevant Orders   Ambulatory referral to Urology     Bleeding of unknown origin          Plan: Negative wet prep and UA. No evidence of hematuria or vaginal bleeding today. Based on symptoms, appears to be urinary in nature. Will refer to urology. If negative workup with urology, pelvic ultrasound recommended.   Return if symptoms worsen or fail to improve.    Olivia Mackie DNP, 9:59 AM 02/06/2023

## 2023-02-07 LAB — URINALYSIS, COMPLETE W/RFL CULTURE
Bilirubin Urine: NEGATIVE
Hyaline Cast: NONE SEEN /[LPF]
Ketones, ur: NEGATIVE
Leukocyte Esterase: NEGATIVE
Nitrites, Initial: NEGATIVE
Specific Gravity, Urine: 1.02 (ref 1.001–1.035)
pH: 7.5 (ref 5.0–8.0)

## 2023-02-07 LAB — URINE CULTURE
MICRO NUMBER:: 15842804
SPECIMEN QUALITY:: ADEQUATE

## 2023-02-07 LAB — CULTURE INDICATED

## 2023-02-14 ENCOUNTER — Ambulatory Visit: Payer: 59 | Admitting: Endocrinology

## 2023-02-17 ENCOUNTER — Ambulatory Visit: Payer: 59 | Admitting: Endocrinology

## 2023-02-26 HISTORY — PX: CT SCAN: SHX5351

## 2023-03-11 ENCOUNTER — Encounter: Payer: Self-pay | Admitting: Nurse Practitioner

## 2023-03-11 ENCOUNTER — Ambulatory Visit: Payer: 59 | Admitting: Nurse Practitioner

## 2023-03-11 VITALS — BP 128/80 | HR 78 | Ht 63.0 in | Wt 250.2 lb

## 2023-03-11 DIAGNOSIS — E1129 Type 2 diabetes mellitus with other diabetic kidney complication: Secondary | ICD-10-CM

## 2023-03-11 DIAGNOSIS — I10 Essential (primary) hypertension: Secondary | ICD-10-CM

## 2023-03-11 DIAGNOSIS — R809 Proteinuria, unspecified: Secondary | ICD-10-CM

## 2023-03-11 DIAGNOSIS — E11319 Type 2 diabetes mellitus with unspecified diabetic retinopathy without macular edema: Secondary | ICD-10-CM

## 2023-03-11 DIAGNOSIS — Z794 Long term (current) use of insulin: Secondary | ICD-10-CM

## 2023-03-11 DIAGNOSIS — Z1211 Encounter for screening for malignant neoplasm of colon: Secondary | ICD-10-CM

## 2023-03-11 DIAGNOSIS — Z0000 Encounter for general adult medical examination without abnormal findings: Secondary | ICD-10-CM | POA: Diagnosis not present

## 2023-03-11 DIAGNOSIS — Z7901 Long term (current) use of anticoagulants: Secondary | ICD-10-CM

## 2023-03-11 DIAGNOSIS — E785 Hyperlipidemia, unspecified: Secondary | ICD-10-CM

## 2023-03-11 DIAGNOSIS — Z5181 Encounter for therapeutic drug level monitoring: Secondary | ICD-10-CM | POA: Diagnosis not present

## 2023-03-11 DIAGNOSIS — Z86711 Personal history of pulmonary embolism: Secondary | ICD-10-CM

## 2023-03-11 DIAGNOSIS — E1169 Type 2 diabetes mellitus with other specified complication: Secondary | ICD-10-CM | POA: Diagnosis not present

## 2023-03-11 DIAGNOSIS — I2609 Other pulmonary embolism with acute cor pulmonale: Secondary | ICD-10-CM

## 2023-03-11 DIAGNOSIS — D751 Secondary polycythemia: Secondary | ICD-10-CM

## 2023-03-11 DIAGNOSIS — F410 Panic disorder [episodic paroxysmal anxiety] without agoraphobia: Secondary | ICD-10-CM
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
## 2023-03-11 NOTE — Patient Instructions (Addendum)
 Start the 81mg  aspirin  while you are off of the xarelto . We will recheck the ct to see if there is still a clot present.   Panic Attack A panic attack is a sudden episode of severe anxiety, fear, or discomfort that causes physical and emotional symptoms. A panic attack may be in response to something frightening, or it may occur for no known reason. Symptoms of a panic attack can be similar to symptoms of a heart attack or stroke. It is important to see your health care provider when you have a panic attack so that these conditions can be ruled out. What are the causes? A panic attack may be caused by: An extreme, life-threatening situation, such as a war or natural disaster. An anxiety disorder, such as post-traumatic stress disorder. Depression. Panic disorder. Certain medical conditions, including heart problems, neurological conditions, and infections. Other causes may include: Certain over-the-counter and prescription medicines. Supplements that increase anxiety. Illegal drugs that increase heart rate and blood pressure, such as methamphetamine. What increases the risk? You are more likely to develop this condition if: You have another mental health condition. You use alcohol, illegal drugs, or other substances. You are under extreme stress. A life event is causing increased feelings of anxiety and depression. What are the signs or symptoms? A panic attack starts suddenly, usually lasts 5-10 minutes, and occurs with one or more of the following: A pounding heart, or a feeling that your heart is beating irregularly or faster than normal (palpitations). Sweating, trembling, or shaking. Shortness of breath, feeling smothered, or feeling choked. Chest pain or discomfort. Nausea or a strange feeling in your stomach. Dizziness, feeling light-headed, or feeling like you might faint. A feeling of loss of control Other symptoms may include: Chills or hot flashes. Numbness or tingling in  your lips, hands, or feet. Feeling confused, or feeling that you are not yourself. Fear of losing control or of being emotionally unstable, or fear of dying. How is this diagnosed? A panic attack is diagnosed with an assessment by your health care provider. During the assessment, your health care provider will ask questions about: Your history of anxiety, depression, and panic attacks. Your medical history. Whether you drink alcohol, use drugs, take supplements, or take medicines. Be honest about your substance use. Your health care provider may also: Order blood tests or other kinds of tests to rule out serious medical conditions. Refer you to a mental health professional for further evaluation. How is this treated? A panic attack is a symptom of another condition. Treatment depends on the cause of the panic attack. If the cause is a medical problem, your health care provider will treat that problem or refer you to a specialist. If the cause is emotional, you may be given anti-anxiety medicines or referred to a counselor. Anti-anxiety medicines may reduce how often attacks happen, reduce how severe the attacks are, and lower anxiety. If the cause is a medicine, your health care provider may tell you to stop the medicine, change your dose, or take a different medicine. If the cause is an illegal drug, treatment may involve letting the drug wear off and taking medicine to help the drug leave your body or to stop its effects. Attacks caused by heavy drug use may continue even if you stop using the drug. Most panic attacks go away with treatment of the underlying problem. If you have panic attacks often, you may have a condition called panic disorder. Follow these instructions at home: Alcohol use  Do not drink alcohol if: Your health care provider tells you not to drink. You are pregnant, may be pregnant, or are planning to become pregnant. If you drink alcohol: Limit how much you have to: 0-1  drink a day for women. 0-2 drinks a day for men. Know how much alcohol is in your drink. In the [place] one drink equals one 12 oz bottle of beer (355 mL), one 5 oz glass of wine (148 mL), or one 1 oz glass of hard liquor (44 mL). General instructions Take over-the-counter and prescription medicines only as told by your health care provider. If you feel anxious, limit your caffeine intake. Take good care of your physical and mental health by: Eating a balanced diet that includes plenty of fresh fruits and vegetables, whole grains, lean meats, and low-fat dairy. Getting plenty of rest. Try to get 7-8 hours of uninterrupted sleep each night. Exercising regularly. Try to get 30 minutes of physical activity at least 5 days a week. Do not use any products that contain nicotine or tobacco. These products include cigarettes, chewing tobacco, and vaping devices, such as e-cigarettes. If you need help quitting, ask your health care provider. Keep all follow-up visits. This is important. Panic attacks may have underlying physical or emotional problems that take time to accurately diagnose. Where to find more information Substance Abuse and Mental Health Services [facility]): rocktoxic.pl [employer] [facility] Mental [facility]): http://www.maynard.net/ Contact a health care provider if: Your symptoms do not improve, or they get worse. You are not able to take your medicine as prescribed because of side effects. Get help right away if: You have thoughts about hurting yourself or others. Get help right away if you feel like you may hurt yourself or others, or have thoughts about taking your own life. Go to your nearest emergency room or: Call 911. Call the National Suicide Prevention Lifeline at (615)487-0679 or 988. This is open 24 hours a day. Text the Crisis Text Line at 952-422-2639. Summary A panic attack is a sudden episode of severe anxiety, fear, or discomfort that causes physical and emotional  symptoms. Always see a health care provider to have the reasons for the panic attack correctly diagnosed. If your panic attack was caused by a physical problem, follow your health care provider's suggestions for medicine, referral to a specialist, and lifestyle changes. If your panic attack was caused by an emotional problem, follow through with counseling from a qualified mental health specialist. If you feel like you may hurt yourself or others, call 911 and get help right away. This information is not intended to replace advice given to you by your health care provider. Make sure you discuss any questions you have with your health care provider. Document Revised: 09/21/2020 Document Reviewed: 09/21/2020 Elsevier Patient Education  2024 [employer].

## 2023-03-12 LAB — CBC WITH DIFFERENTIAL/PLATELET
Basophils Absolute: 0.1 10*3/uL (ref 0.0–0.2)
Basos: 1 %
EOS (ABSOLUTE): 0 10*3/uL (ref 0.0–0.4)
Eos: 1 %
Hematocrit: 47.4 % — ABNORMAL HIGH (ref 34.0–46.6)
Hemoglobin: 14 g/dL (ref 11.1–15.9)
Immature Grans (Abs): 0 10*3/uL (ref 0.0–0.1)
Immature Granulocytes: 0 %
Lymphocytes Absolute: 2.6 10*3/uL (ref 0.7–3.1)
Lymphs: 47 %
MCH: 22.3 pg — ABNORMAL LOW (ref 26.6–33.0)
MCHC: 29.5 g/dL — ABNORMAL LOW (ref 31.5–35.7)
MCV: 75 fL — ABNORMAL LOW (ref 79–97)
Monocytes Absolute: 0.4 10*3/uL (ref 0.1–0.9)
Monocytes: 7 %
Neutrophils Absolute: 2.4 10*3/uL (ref 1.4–7.0)
Neutrophils: 44 %
Platelets: 182 10*3/uL (ref 150–450)
RBC: 6.29 x10E6/uL — ABNORMAL HIGH (ref 3.77–5.28)
RDW: 15.1 % (ref 11.7–15.4)
WBC: 5.5 10*3/uL (ref 3.4–10.8)

## 2023-03-12 LAB — CMP14+EGFR
ALT: 33 IU/L — ABNORMAL HIGH (ref 0–32)
AST: 34 IU/L (ref 0–40)
Albumin: 4 g/dL (ref 3.8–4.9)
Alkaline Phosphatase: 125 IU/L — ABNORMAL HIGH (ref 44–121)
BUN/Creatinine Ratio: 19 (ref 9–23)
BUN: 13 mg/dL (ref 6–24)
CO2: 23 mmol/L (ref 20–29)
Calcium: 9.4 mg/dL (ref 8.7–10.2)
Chloride: 99 mmol/L (ref 96–106)
Creatinine, Ser: 0.67 mg/dL (ref 0.57–1.00)
Globulin, Total: 3.7 g/dL (ref 1.5–4.5)
Glucose: 176 mg/dL — ABNORMAL HIGH (ref 70–99)
Potassium: 4.5 mmol/L (ref 3.5–5.2)
Sodium: 137 mmol/L (ref 134–144)
Total Protein: 7.7 g/dL (ref 6.0–8.5)
eGFR: 101 mL/min/{1.73_m2} (ref >59)

## 2023-03-12 LAB — HEMOGLOBIN A1C
Est. average glucose Bld gHb Est-mCnc: 280 mg/dL
Hgb A1c MFr Bld: 11.4 % — ABNORMAL HIGH (ref 4.8–5.6)

## 2023-03-14 ENCOUNTER — Other Ambulatory Visit (HOSPITAL_COMMUNITY): Payer: Self-pay | Admitting: Urology

## 2023-03-14 DIAGNOSIS — R310 Gross hematuria: Secondary | ICD-10-CM

## 2023-03-17 DIAGNOSIS — Z86711 Personal history of pulmonary embolism: Secondary | ICD-10-CM | POA: Insufficient documentation

## 2023-03-17 DIAGNOSIS — Z0000 Encounter for general adult medical examination without abnormal findings: Secondary | ICD-10-CM | POA: Insufficient documentation

## 2023-03-17 DIAGNOSIS — F410 Panic disorder [episodic paroxysmal anxiety] without agoraphobia: Secondary | ICD-10-CM | POA: Insufficient documentation

## 2023-03-17 NOTE — Assessment & Plan Note (Signed)
Blood pressure stable today on valsartan-hydrochlorothiazide, metoprolol and amlodipine. No concerns at this time.  -Continue current therapy. -Labs pending

## 2023-03-17 NOTE — Assessment & Plan Note (Signed)
Bilateral PE with no identified cause. Currently off Xarelto with uncertainty if restarting is necessary. Considering genetic clotting disorder but no family history. Discussed risks of recurrent clots and benefits of anticoagulation. Decision to hold off on Xarelto due to recent bleeding and lack of symptoms. We will order imaging to determine if clots have resolved or if further treatment is necessary.  - Restart baby aspirin 81 mg daily for now - Order CT to check for residual clots - Order follow-up imaging to assess for any remaining clots

## 2023-03-17 NOTE — Assessment & Plan Note (Signed)
Managed with simvastatin. Labs today.  - Continue simvastatin for cholesterol

## 2023-03-17 NOTE — Assessment & Plan Note (Signed)

## 2023-03-17 NOTE — Assessment & Plan Note (Signed)
Continue management with mounjaro and regular glucose monitoring to ensure blood sugars are within expected range.

## 2023-03-17 NOTE — Assessment & Plan Note (Signed)
Not currently monitoring blood glucose due to lack of a functioning meter. Blood pressure is well-controlled on valsartan. Lipid therapy with simvastatin in place. Discussed need for regular blood glucose monitoring and replacement of glucose meter. - Replace blood glucose meter. - Continue mounjaro for blood sugar control - Continue valsartan for blood pressure management - Continue simvastatin for cholesterol

## 2023-03-17 NOTE — Assessment & Plan Note (Signed)
Intermittent episodes of feeling overwhelmed, irritable, and sensations of being in a straitjacket, particularly in the evenings. Likely atypical panic attacks. Discussed coping mechanisms such as using a fan, applying cool water, and taking breaks. - Implement coping mechanisms such as using a fan, applying cool water, and taking breaks

## 2023-03-20 ENCOUNTER — Ambulatory Visit
Admission: RE | Admit: 2023-03-20 | Discharge: 2023-03-20 | Disposition: A | Discharge status: Home / Self Care | Payer: 59 | Source: Ambulatory Visit | Attending: Nurse Practitioner | Admitting: Nurse Practitioner

## 2023-03-20 DIAGNOSIS — Z86711 Personal history of pulmonary embolism: Secondary | ICD-10-CM

## 2023-03-20 DIAGNOSIS — Z7901 Long term (current) use of anticoagulants: Secondary | ICD-10-CM

## 2023-03-20 MED ORDER — IOPAMIDOL (ISOVUE-370) INJECTION 76%
75.0000 mL | Freq: Once | INTRAVENOUS | Status: AC | PRN
  Administered 2023-03-20: 75 mL via INTRAVENOUS

## 2023-03-25 ENCOUNTER — Other Ambulatory Visit: Payer: Self-pay

## 2023-03-25 ENCOUNTER — Encounter: Payer: Self-pay | Admitting: Nurse Practitioner

## 2023-03-25 ENCOUNTER — Ambulatory Visit: Payer: 59 | Admitting: Endocrinology

## 2023-03-25 ENCOUNTER — Telehealth: Payer: Self-pay | Admitting: Hematology

## 2023-03-25 DIAGNOSIS — R718 Other abnormality of red blood cells: Secondary | ICD-10-CM

## 2023-03-25 NOTE — Addendum Note (Signed)
Addended by: Malaki Koury, Huntley Dec E on: 03/25/2023 07:42 AM   Modules accepted: Orders

## 2023-03-25 NOTE — Telephone Encounter (Signed)
Spoke with patient confirming upcoming appointments

## 2023-03-28 ENCOUNTER — Ambulatory Visit (HOSPITAL_COMMUNITY)
Admission: RE | Admit: 2023-03-28 | Discharge: 2023-03-28 | Disposition: A | Discharge status: Home / Self Care | Payer: 59 | Source: Ambulatory Visit | Attending: Urology | Admitting: Urology

## 2023-03-28 DIAGNOSIS — R310 Gross hematuria: Secondary | ICD-10-CM | POA: Insufficient documentation

## 2023-03-28 MED ORDER — IOHEXOL 350 MG/ML SOLN
100.0000 mL | Freq: Once | INTRAVENOUS | Status: AC | PRN
  Administered 2023-03-28: 100 mL via INTRAVENOUS

## 2023-03-28 MED ORDER — IOHEXOL 300 MG/ML  SOLN: 100.0000 mL | Freq: Once | INTRAMUSCULAR | Status: DC | PRN

## 2023-03-28 MED ORDER — SODIUM CHLORIDE (PF) 0.9 % IJ SOLN
INTRAMUSCULAR | Status: AC
  Filled 2023-03-28: qty 50

## 2023-03-28 MED ORDER — SODIUM CHLORIDE 0.9 % IV SOLN
INTRAVENOUS | Status: AC
  Filled 2023-03-28: qty 250

## 2023-03-30 ENCOUNTER — Ambulatory Visit (HOSPITAL_COMMUNITY)
Admission: EM | Admit: 2023-03-30 | Discharge: 2023-03-30 | Disposition: A | Discharge status: Home / Self Care | Payer: 59 | Attending: Internal Medicine | Admitting: Internal Medicine

## 2023-03-30 ENCOUNTER — Encounter (HOSPITAL_COMMUNITY): Payer: Self-pay

## 2023-03-30 ENCOUNTER — Ambulatory Visit (INDEPENDENT_AMBULATORY_CARE_PROVIDER_SITE_OTHER): Payer: 59

## 2023-03-30 DIAGNOSIS — J069 Acute upper respiratory infection, unspecified: Secondary | ICD-10-CM | POA: Diagnosis not present

## 2023-03-30 DIAGNOSIS — R051 Acute cough: Secondary | ICD-10-CM

## 2023-03-30 MED ORDER — BENZONATATE 100 MG PO CAPS: 100.0000 mg | ORAL_CAPSULE | Freq: Three times a day (TID) | ORAL | 0 refills | Status: AC

## 2023-03-30 MED ORDER — AZITHROMYCIN 250 MG PO TABS: ORAL_TABLET | ORAL | 0 refills | Status: DC

## 2023-03-30 MED ORDER — PREDNISONE 20 MG PO TABS: 40.0000 mg | ORAL_TABLET | Freq: Every day | ORAL | 0 refills | Status: AC

## 2023-03-30 NOTE — Discharge Instructions (Addendum)
Likely influenza that has gone into a bacterial pneumonia.  Chest x-ray done today and was normal but still with concern for respiratory infection.  We will treat with the following given the duration and severity of symptoms: Azithromycin 250mg  Take 2 tablets today and the 1 tablet daily for 4 more days. This is an antibiotic. Prednisone 40 mg (2 tablets) daily for 5 days. Take this in the morning.  This is a steroid to help with airway inflammation Benzonatate 100 mg every 8 hours as needed for cough. Rest and stay hydrated Return to urgent care or PCP if symptoms worsen or fail to resolve.

## 2023-03-30 NOTE — ED Provider Notes (Signed)
[facility]    CSN: [id number] Arrival date & time: 03/30/23  1652      History   Chief Complaint Chief Complaint  Patient presents with   Cough   Nasal Congestion    HPI Victoria Padilla is a 58 y.o. female.   58 year old female who presents to urgent care with complaints of significant cough, shortness of breath, sneezing and musculoskeletal pains from coughing.  This has been ongoing since Sunday last week.  She was exposed to influenza a little over a week ago.  She has been trying over-the-counter medications such as Robitussin Mucinex, TheraFlu but her symptoms are persisting.  She has had some fevers and chills as well.  She reports that the coughing has become productive as well as congestion with yellow to greenish mucus.   Cough Associated symptoms: chills, fever and shortness of breath   Associated symptoms: no chest pain, no ear pain, no rash and no sore throat     Past Medical History:  Diagnosis Date   Acid reflux    Acute non-recurrent frontal sinusitis 06/17/2022   Anemia    Arthritis    Asthma    Cervical dysplasia    Diabetes mellitus    DVT (deep venous thrombosis) (HCC)    Gallbladder problem    GERD (gastroesophageal reflux disease)    Heart murmur    Hx of blood clots    Hyperlipidemia    Hypertension    LBP (low back pain)    Liver problem    Obesity    Orthopnea 02/13/2018   OSA (obstructive sleep apnea) 03/22/2018   New diagnosis. Mild.    Personal history of noncompliance with medical treatment, presenting hazards to health 12/21/2015   Pulmonary embolism (HCC)    Rash and nonspecific skin eruption 06/17/2022   Reactive airway disease, mild intermittent, with acute exacerbation 06/17/2022   Sarcoidosis    Sleep apnea    Vitamin D deficiency     Patient Active Problem List   Diagnosis Date Noted   Encounter for annual physical exam 03/17/2023   Personal history of pulmonary embolism 03/17/2023   Panic attack 03/17/2023    Bilateral occipital neuralgia 09/25/2022   Muscle spasms of neck 09/25/2022   Microalbuminuria due to type 2 diabetes mellitus (HCC) 07/18/2022   Diabetes (HCC) 12/09/2020   Pulmonary embolism (HCC) 08/15/2018   Weight gain 08/03/2018   OSA (obstructive sleep apnea) 03/22/2018   Family history of heart disease in female family member before age 23 02/13/2018   GERD (gastroesophageal reflux disease) 12/21/2015   Morbid obesity (HCC) 03/03/2015   History of anemia 03/03/2015   Hyperlipidemia associated with type 2 diabetes mellitus (HCC) 03/03/2015   Benign essential HTN 03/03/2015   Arthritis    Sarcoidosis     Past Surgical History:  Procedure Laterality Date   BREAST BIOPSY Left 2019   BUNIONECTOMY     CHOLECYSTECTOMY     COLPOSCOPY     CT SCAN  2025   GYNECOLOGIC CRYOSURGERY     KNEE ARTHROSCOPY     TUBAL LIGATION      OB History     Gravida  3   Para  3   Term  3   Preterm      AB      Living  3      SAB      IAB      Ectopic      Multiple  Live Births               Home Medications    Prior to Admission medications   Medication Sig Start Date End Date Taking? Authorizing Provider  Albuterol-Budesonide (AIRSUPRA) 90-80 MCG/ACT AERO Inhale 1-2 Inhalations into the lungs every 4 (four) hours. As needed for cough, wheeze, shortness of breath. 06/17/22   Early, Sung Amabile, NP  amLODipine (NORVASC) 5 MG tablet Take 1 tablet (5 mg total) by mouth daily. 11/27/21   Reather Littler, MD  B-D ULTRAFINE III SHORT PEN 31G X 8 MM MISC USE TO INJECT BASAGLAR DAILY- NEED APPT 08/23/20   Romero Belling, MD  BESIVANCE 0.6 % SUSP  11/28/19   [provider]  cetirizine (ZYRTEC) 10 MG tablet Take 10 mg by mouth daily.    [provider]  cholecalciferol (VITAMIN D) 1000 units tablet Take 2,000 Units by mouth at bedtime.    [provider]  clobetasol ointment (TEMOVATE) 0.05 % APPLY TOPICALLY TWICE A DAY X 4 WEEKS, THEN ONCE A DAY X 4 WEEKS,  THEN TWICE A WEEK. 12/28/20   Wyline Beady A, NP  cyclobenzaprine (FLEXERIL) 10 MG tablet Take 0.5-1 tablets (5-10 mg total) by mouth 3 (three) times daily as needed for muscle spasms. May make you sleepy. Start at bedtime. 08/20/22   Tollie Eth, NP  Dapagliflozin Pro-metFORMIN ER (XIGDUO XR) 06-998 MG TB24 Take 2 tablets by mouth daily. 07/18/22   Reather Littler, MD  diclofenac Sodium (VOLTAREN) 1 % GEL Apply 2 g topically 4 (four) times daily. 11/09/19   Cristie Hem, PA-C  Fluticasone-Umeclidin-Vilant (TRELEGY ELLIPTA) 200-62.5-25 MCG/ACT AEPB Once a day. 06/17/22   Tollie Eth, NP  glucose blood (FREESTYLE LITE) test strip 1 each by Other route 2 (two) times daily. And lancets 2/day 03/20/21   Romero Belling, MD  glucose blood [facility] VERIO) test strip Use as instructed to check blood sugar 2X daily 01/08/22   Reather Littler, MD  Insulin Glargine [facility]) 100 UNIT/ML Inject 40 Units into the skin every morning. 11/14/22   Thapa, [place], MD  levocetirizine (XYZAL) 2.5 MG/5ML solution Take 10 mLs (5 mg total) by mouth every evening. 06/17/22   Early, Sung Amabile, NP  metoprolol succinate (TOPROL-XL) 50 MG 24 hr tablet Take 1 tablet (50 mg total) by mouth daily. Take with or immediately following a meal. 01/07/23   Early, Sung Amabile, NP  mometasone (ELOCON) 0.1 % cream Apply a thin layer to the skin once a day for rash. 06/17/22   Tollie Eth, NP  Olopatadine HCl 0.2 % SOLN Place 2-4 drops in the eyes every 4-6 hours as needed for allergy symptoms. 06/17/22   Tollie Eth, NP  Omega-3 Fatty Acids (FISH OIL PO) Take 1 capsule by mouth at bedtime. Reported on 08/08/2015    [provider]  omeprazole (PRILOSEC) 40 MG capsule TAKE 1 CAPSULE BY MOUTH EVERY DAY 04/03/22   Ronnald Nian, MD  ondansetron (ZOFRAN) 4 MG tablet Take 4 mg by mouth every 8 (eight) hours as needed for nausea or vomiting.    [provider]  rivaroxaban (XARELTO) 10 MG TABS tablet TAKE 1 TABLET (10 MG TOTAL) BY  MOUTH DAILY 01/07/23   Early, Sung Amabile, NP  simvastatin (ZOCOR) 20 MG tablet TAKE 1 TABLET BY MOUTH EVERYDAY AT BEDTIME 04/08/22   Reather Littler, MD  tirzepatide [facility]) 10 MG/0.5ML Pen Inject 10 mg into the skin once a week. 11/14/22   Thapa,  [place], MD  valsartan-hydrochlorothiazide (DIOVAN-HCT) 160-25 MG tablet Take 1 tablet by mouth daily. 01/07/23   Early, Sung Amabile, NP    Family History Family History  Problem Relation Age of Onset   Heart failure Mother 25       chf   Diabetes Mother    High blood pressure Mother    Heart disease Mother    Sudden death Mother    Kidney disease Mother    Obesity Mother    Heart failure Father    Heart disease Father    Sudden death Father    Alcoholism Father    Cancer Brother 15       prostate   Hypertension Maternal Uncle    Hyperlipidemia Brother    Diabetes Brother    Colon cancer Maternal Grandfather    Colon polyps Brother    Diabetes Brother    Hyperlipidemia Brother    Stomach cancer Neg Hx    Esophageal cancer Neg Hx    Rectal cancer Neg Hx    Liver cancer Neg Hx    Breast cancer Neg Hx     Social History Social History   Tobacco Use   Smoking status: Never   Smokeless tobacco: Never  Vaping Use   Vaping status: Never Used  Substance Use Topics   Alcohol use: No   Drug use: No     Allergies   Naprosyn [naproxen]   Review of Systems Review of Systems  Constitutional:  Positive for activity change, chills and fever.  HENT:  Positive for congestion. Negative for ear pain and sore throat.   Eyes:  Negative for pain and visual disturbance.  Respiratory:  Positive for cough and shortness of breath.   Cardiovascular:  Negative for chest pain and palpitations.  Gastrointestinal:  Negative for abdominal pain and vomiting.  Genitourinary:  Negative for dysuria and hematuria.  Musculoskeletal:  Negative for arthralgias and back pain.  Skin:  Negative for color change and rash.  Neurological:  Negative for seizures and  syncope.  All other systems reviewed and are negative.    Physical Exam Triage Vital Signs ED Triage Vitals  Encounter Vitals Group     BP 03/30/23 1742 (!) 159/85     Systolic BP Percentile --      Diastolic BP Percentile --      Pulse Rate 03/30/23 1742 98     Resp 03/30/23 1742 18     Temp 03/30/23 1742 98.2 F (36.8 C)     Temp Source 03/30/23 1742 Oral     SpO2 03/30/23 1742 93 %     Weight --      Height --      Head Circumference --      Peak Flow --      Pain Score 03/30/23 1739 0     Pain Loc --      Pain Education --      Exclude from Growth Chart --    No data found.  Updated Vital Signs BP (!) 159/85 (BP Location: Left Arm)   Pulse 98   Temp 98.2 F (36.8 C) (Oral)   Resp 18   LMP 01/23/2023 (Approximate)   SpO2 93%   Visual Acuity Right Eye Distance:   Left Eye Distance:   Bilateral Distance:    Right Eye Near:   Left Eye Near:    Bilateral Near:     Physical Exam Vitals and nursing note reviewed.  Constitutional:  General: She is not in acute distress.    Appearance: She is well-developed.  HENT:     Head: Normocephalic and atraumatic.  Eyes:     Conjunctiva/sclera: Conjunctivae normal.  Cardiovascular:     Rate and Rhythm: Normal rate and regular rhythm.     Heart sounds: No murmur heard. Pulmonary:     Effort: Pulmonary effort is normal. No respiratory distress.     Breath sounds: Examination of the right-upper field reveals decreased breath sounds and rhonchi. Decreased breath sounds and rhonchi present. No wheezing or rales.  Abdominal:     Palpations: Abdomen is soft.     Tenderness: There is no abdominal tenderness.  Musculoskeletal:        General: No swelling.     Cervical back: Neck supple.  Skin:    General: Skin is warm and dry.     Capillary Refill: Capillary refill takes less than 2 seconds.  Neurological:     Mental Status: She is alert.  Psychiatric:        Mood and Affect: Mood normal.      UC Treatments  / Results  Labs (all labs ordered are listed, but only abnormal results are displayed) Labs Reviewed - No data to display  EKG   Radiology No results found.  Procedures Procedures (including critical care time)  Medications Ordered in UC Medications - No data to display  Initial Impression / Assessment and Plan / UC Course  I have reviewed the triage vital signs and the nursing notes.  Pertinent labs & imaging results that were available during my care of the patient were reviewed by me and considered in my medical decision making (see chart for details).     Acute cough - Plan: DG Chest 2 View, DG Chest 2 View  Acute upper respiratory infection   Likely influenza that has gone into a bacterial pneumonia.  Chest x-ray done today and was normal but still with concern for respiratory infection.  We will treat with the following given the duration and severity of symptoms: Azithromycin 250mg  Take 2 tablets today and the 1 tablet daily for 4 more days. Prednisone 40 mg daily for 5 days. Take this in the morning.  This is a steroid to help with airway inflammation Benzonatate 100 mg every 8 hours as needed for cough. Rest and stay hydrated Return to urgent care or PCP if symptoms worsen or fail to resolve.        Final Clinical Impressions(s) / UC Diagnoses   Final diagnoses:  Acute cough     Discharge Instructions       Azithromycin 250mg  Take 2 tablets today and the 1 tablet daily for 4 more days. Prednisone 40 mg daily for 5 days. Take this in the morning.  Benzonatate 100 mg every 8 hours as needed for cough    ED Prescriptions   None    PDMP not reviewed this encounter.   Landis Martins, [place] 03/30/23 [phone]

## 2023-03-30 NOTE — ED Triage Notes (Addendum)
Pt c/o of a productive cough, sneezing, shortness of breath, colored nasal drainage, and rib cage pain with coughing.    Start date: 03/23/2023  Home Interventions: Robitussin, Mucinex, Theraflu, Prescription Cough Medication

## 2023-04-02 ENCOUNTER — Telehealth: Payer: Self-pay

## 2023-04-02 ENCOUNTER — Other Ambulatory Visit: Payer: Self-pay | Admitting: Nurse Practitioner

## 2023-04-02 DIAGNOSIS — N950 Postmenopausal bleeding: Secondary | ICD-10-CM

## 2023-04-02 NOTE — Telephone Encounter (Signed)
-----   Message from Andee Bamberger sent at 04/01/2023  4:02 PM EST ----- Regarding: Ultrasound Please have patient schedule ultrasound for possible PMB. Had negative workup with urology.

## 2023-04-02 NOTE — Telephone Encounter (Signed)
 Per DM:  Pt now scheduled for PUS/OV w/ TW on 04/08/2023.  Encounter routed for final review and closed.

## 2023-04-08 ENCOUNTER — Ambulatory Visit: Payer: 59 | Admitting: Nurse Practitioner

## 2023-04-08 ENCOUNTER — Ambulatory Visit (INDEPENDENT_AMBULATORY_CARE_PROVIDER_SITE_OTHER): Payer: 59

## 2023-04-08 VITALS — BP 136/84 | HR 81

## 2023-04-08 DIAGNOSIS — N950 Postmenopausal bleeding: Secondary | ICD-10-CM

## 2023-04-08 NOTE — Progress Notes (Signed)
   Acute Office Visit  Subjective:    Patient ID: GIULIETTA PROKOP, female    DOB: 11/02/1965, 58 y.o.   MRN: [id number]   HPI 58 y.o. presents today for follow up for bleeding. Seen 02/06/23 with complaints of bleeding of unknown source. Bleeding was very light, only with wiping after urinating. No bleeding throughout the day. Unsure if vaginal or urinary. Has odd sensation with urinating that feels like intense itching after end of stream. Also felt "popping" sensation while urinating. Negative wet prep and UA at that time. Negative cystoscopy and CT hematuria workup with urology last month. Reports having period-like bleeding the last few years every time she gets close to the one year mark of amenorrhea. Experiences PMS symptoms with bleeding. Had period-like bleeding last week. Still having spotting today. H/O uterine fibroids. Negative EMB in 2021. Takes Xarelto. Reports grandmother had spotting until she passed at 6.   Patient's last menstrual period was 01/23/2023 (approximate).    Review of Systems  Constitutional: Negative.   Genitourinary:  Negative for difficulty urinating, dysuria, flank pain, frequency, pelvic pain, urgency, vaginal discharge and vaginal pain.       Objective:    Physical Exam Constitutional:      Appearance: Normal appearance.   GU: Not indicated  BP 136/84 (BP Location: Left Arm, Patient Position: Sitting, Cuff Size: Large)   Pulse 81   LMP 01/23/2023 (Approximate)   SpO2 96%  Wt Readings from Last 3 Encounters:  03/11/23 250 lb 3.2 oz (113.5 kg)  02/06/23 250 lb (113.4 kg)  01/07/23 247 lb 9.6 oz (112.3 kg)          Assessment & Plan:   Problem List Items Addressed This Visit   None Visit Diagnoses       PMB (postmenopausal bleeding)    -  Primary   Relevant Orders   Endometrial biopsy      Both transabdominal and transvaginal techniques were necessary:  Anteverted uterus, multiple intramural fibroids noted -  largest measuring  53 mm (was 61 mm in 2021), displaces the endometrium posteriorly.  Thin, symmetrical endometrium - 2.26 mm.  No masses or thickening seen, avascular.  Both ovaries atrophic in size.  No adnexal masses, no free fluid.  Plan: Ultrasound reviewed with patient. Small fibroids noted, largest has decreased slightly in size since 2021. Reassured not cause for bleeding. Recommend EMB due to bleeding and negative urology workup.       Olivia Mackie DNP, 9:13 AM 04/08/2023

## 2023-04-15 ENCOUNTER — Other Ambulatory Visit: Payer: Self-pay

## 2023-04-15 ENCOUNTER — Telehealth: Payer: Self-pay | Admitting: Endocrinology

## 2023-04-15 DIAGNOSIS — E1165 Type 2 diabetes mellitus with hyperglycemia: Secondary | ICD-10-CM

## 2023-04-15 MED ORDER — ONETOUCH VERIO W/DEVICE KIT: PACK | 0 refills | Status: DC

## 2023-04-15 NOTE — Telephone Encounter (Signed)
 Patient said her insurance [employer] advised her to come by the office and give [place] info for patient to get Onetouch. She brought in a Higher education careers adviser on her phone with this info. Bin: [id number] RxPCN: [facility] ID#: [id number]  She also said the script needed to be sent to the pharmacy [id number] - [place], [place] - [address]  Call back if needed is [phone]

## 2023-04-21 ENCOUNTER — Encounter: Payer: Self-pay | Admitting: Nurse Practitioner

## 2023-04-21 ENCOUNTER — Inpatient Hospital Stay: Payer: 59

## 2023-04-21 ENCOUNTER — Inpatient Hospital Stay: Payer: 59 | Attending: Hematology | Admitting: Hematology

## 2023-04-21 ENCOUNTER — Other Ambulatory Visit (HOSPITAL_COMMUNITY)
Admission: RE | Admit: 2023-04-21 | Discharge: 2023-04-21 | Disposition: A | Discharge status: Home / Self Care | Payer: 59 | Source: Ambulatory Visit | Attending: Nurse Practitioner | Admitting: Nurse Practitioner

## 2023-04-21 ENCOUNTER — Ambulatory Visit (INDEPENDENT_AMBULATORY_CARE_PROVIDER_SITE_OTHER): Payer: 59 | Admitting: Nurse Practitioner

## 2023-04-21 VITALS — BP 118/80 | HR 94

## 2023-04-21 VITALS — BP 153/81 | HR 100 | Temp 97.9°F | Resp 18 | Wt 238.9 lb

## 2023-04-21 DIAGNOSIS — N950 Postmenopausal bleeding: Secondary | ICD-10-CM

## 2023-04-21 DIAGNOSIS — Z86718 Personal history of other venous thrombosis and embolism: Secondary | ICD-10-CM | POA: Diagnosis not present

## 2023-04-21 DIAGNOSIS — R718 Other abnormality of red blood cells: Secondary | ICD-10-CM | POA: Insufficient documentation

## 2023-04-21 DIAGNOSIS — Z86711 Personal history of pulmonary embolism: Secondary | ICD-10-CM | POA: Insufficient documentation

## 2023-04-21 DIAGNOSIS — D751 Secondary polycythemia: Secondary | ICD-10-CM

## 2023-04-21 DIAGNOSIS — Z7901 Long term (current) use of anticoagulants: Secondary | ICD-10-CM | POA: Insufficient documentation

## 2023-04-21 LAB — CBC WITH DIFFERENTIAL (CANCER CENTER ONLY)
Abs Immature Granulocytes: 0.02 10*3/uL (ref 0.00–0.07)
Basophils Absolute: 0 10*3/uL (ref 0.0–0.1)
Basophils Relative: 1 %
Eosinophils Absolute: 0 10*3/uL (ref 0.0–0.5)
Eosinophils Relative: 0 %
HCT: 43.9 % (ref 36.0–46.0)
Hemoglobin: 13.4 g/dL (ref 12.0–15.0)
Immature Granulocytes: 0 %
Lymphocytes Relative: 50 %
Lymphs Abs: 3.4 10*3/uL (ref 0.7–4.0)
MCH: 22.1 pg — ABNORMAL LOW (ref 26.0–34.0)
MCHC: 30.5 g/dL (ref 30.0–36.0)
MCV: 72.4 fL — ABNORMAL LOW (ref 80.0–100.0)
Monocytes Absolute: 0.7 10*3/uL (ref 0.1–1.0)
Monocytes Relative: 10 %
Neutro Abs: 2.7 10*3/uL (ref 1.7–7.7)
Neutrophils Relative %: 39 %
Platelet Count: 142 10*3/uL — ABNORMAL LOW (ref 150–400)
RBC: 6.06 MIL/uL — ABNORMAL HIGH (ref 3.87–5.11)
RDW: 14.6 % (ref 11.5–15.5)
Smear Review: NORMAL
WBC Count: 6.9 10*3/uL (ref 4.0–10.5)
nRBC: 0 % (ref 0.0–0.2)

## 2023-04-21 LAB — CMP (CANCER CENTER ONLY)
ALT: 20 U/L (ref 0–44)
AST: 22 U/L (ref 15–41)
Albumin: 3.8 g/dL (ref 3.5–5.0)
Alkaline Phosphatase: 84 U/L (ref 38–126)
Anion gap: 5 (ref 5–15)
BUN: 12 mg/dL (ref 6–20)
CO2: 30 mmol/L (ref 22–32)
Calcium: 9.4 mg/dL (ref 8.9–10.3)
Chloride: 103 mmol/L (ref 98–111)
Creatinine: 0.92 mg/dL (ref 0.44–1.00)
GFR, Estimated: >60 mL/min (ref >60)
Glucose, Bld: 197 mg/dL — ABNORMAL HIGH (ref 70–99)
Potassium: 4.1 mmol/L (ref 3.5–5.1)
Sodium: 138 mmol/L (ref 135–145)
Total Bilirubin: 0.3 mg/dL (ref 0.0–1.2)
Total Protein: 7.4 g/dL (ref 6.5–8.1)

## 2023-04-21 LAB — IRON AND IRON BINDING CAPACITY (CC-WL,HP ONLY)
Iron: 61 ug/dL (ref 28–170)
Saturation Ratios: 16 % (ref 10.4–31.8)
TIBC: 384 ug/dL (ref 250–450)
UIBC: 323 ug/dL (ref 148–442)

## 2023-04-21 NOTE — Progress Notes (Signed)
 HEMATOLOGY/ONCOLOGY CONSULTATION NOTE  Date of Service: 04/21/2023  Patient Care Team: Early, Sung Amabile, NP as PCP - General (Nurse Practitioner) Nahser, Deloris Ping, MD as PCP - Cardiology (Cardiology)  CHIEF COMPLAINTS/PURPOSE OF CONSULTATION:  Elevated RBC.   HISTORY OF PRESENTING ILLNESS:   Victoria Padilla is a wonderful 58 y.o. female who has been referred to [place] by NP Enid Skeens for evaluation and management of elevated RBC. Recent lab from 03/11/2023 showed elevated RBC of 6.29 and elevated hematocrit of 47.4%. RBC has been elevated for a while. Lab from 10/26/2019 showed elevated RBC of 5.74.   Patient used to follow-up with [place] in 2020 for an acute pulmonary embolism/DVT and her last visit with [place] was on 01/11/2019. Patient had a repeat VAS [place] Lower extremity which showed resolved DVT. She was discharged and was recommended to continue blood-thinners long-term due to no known provoking event and extensive clots.   Patient notes she recently had an upper respiratory infection and Pneumonia. She notes that her energy levels have been fluctuating since her infection. During this visit, she notes that her upper respiratory symptoms have improved, but complains of mild cough.   Patient reports staying well-hydrated, drinking around 2 L of water daily. She currently drinks 3 cups of green tea mixed with coffee. She denies drinking soda.   Patient complains of night sweats, which she attributes to menopause. She denies any fever, chills, back pain, chest pain, abdominal pain, or leg swelling. She does report weight loss due to her recent infection and Pneumonia.   She notes she did not take her blood thinner for one year due her PCP. She has a new PCP now. She had an Ultra-sound with her PCP, which showed new small DVT. Patient has started taking her blood thinner again.   She denies any FmHx of blood disorder nor thalassemia disorder.   She denies smoking cigarettes.    MEDICAL  HISTORY:  Past Medical History:  Diagnosis Date   Acid reflux    Acute non-recurrent frontal sinusitis 06/17/2022   Anemia    Arthritis    Asthma    Cervical dysplasia    Diabetes mellitus    DVT (deep venous thrombosis) (HCC)    Gallbladder problem    GERD (gastroesophageal reflux disease)    Heart murmur    Hx of blood clots    Hyperlipidemia    Hypertension    LBP (low back pain)    Liver problem    Obesity    Orthopnea 02/13/2018   OSA (obstructive sleep apnea) 03/22/2018   New diagnosis. Mild.    Personal history of noncompliance with medical treatment, presenting hazards to health 12/21/2015   Pulmonary embolism (HCC)    Rash and nonspecific skin eruption 06/17/2022   Reactive airway disease, mild intermittent, with acute exacerbation 06/17/2022   Sarcoidosis    Sleep apnea    Vitamin D deficiency     SURGICAL HISTORY: Past Surgical History:  Procedure Laterality Date   BREAST BIOPSY Left 2019   BUNIONECTOMY     CHOLECYSTECTOMY     COLPOSCOPY     CT SCAN  2025   GYNECOLOGIC CRYOSURGERY     KNEE ARTHROSCOPY     TUBAL LIGATION      SOCIAL HISTORY: Social History   Socioeconomic History   Marital status: Single    Spouse name: Not on file   Number of children: 3   Years of education: Not on file   Highest  education level: Not on file  Occupational History   Occupation: Tax adviser: [employer]  Tobacco Use   Smoking status: Never   Smokeless tobacco: Never  Vaping Use   Vaping status: Never Used  Substance and Sexual Activity   Alcohol use: No   Drug use: No   Sexual activity: Not Currently    Birth control/protection: Surgical  Other Topics Concern   Not on file  Social History Narrative   Not on file   Social Drivers of Health   Financial Resource Strain: Not on file  Food Insecurity: Not on file  Transportation Needs: Not on file  Physical Activity: Not on file  Stress: Not on file  Social Connections: Not  on file  Intimate Partner Violence: Not on file    FAMILY HISTORY: Family History  Problem Relation Age of Onset   Heart failure Mother 18       chf   Diabetes Mother    High blood pressure Mother    Heart disease Mother    Sudden death Mother    Kidney disease Mother    Obesity Mother    Heart failure Father    Heart disease Father    Sudden death Father    Alcoholism Father    Cancer Brother 76       prostate   Hypertension Maternal Uncle    Hyperlipidemia Brother    Diabetes Brother    Colon cancer Maternal Grandfather    Colon polyps Brother    Diabetes Brother    Hyperlipidemia Brother    Stomach cancer Neg Hx    Esophageal cancer Neg Hx    Rectal cancer Neg Hx    Liver cancer Neg Hx    Breast cancer Neg Hx     ALLERGIES:  is allergic to naprosyn [naproxen].  MEDICATIONS:  Current Outpatient Medications  Medication Sig Dispense Refill   Albuterol-Budesonide (AIRSUPRA) 90-80 MCG/ACT AERO Inhale 1-2 Inhalations into the lungs every 4 (four) hours. As needed for cough, wheeze, shortness of breath. 10.7 g 0   amLODipine (NORVASC) 5 MG tablet Take 1 tablet (5 mg total) by mouth daily. 90 tablet 1   azithromycin (ZITHROMAX) 250 MG tablet Take first 2 tablets together, then 1 every day until finished. 6 tablet 0   B-D ULTRAFINE III SHORT PEN 31G X 8 MM MISC USE TO INJECT BASAGLAR DAILY- NEED APPT 100 each 0   benzonatate (TESSALON) 100 MG capsule Take 1 capsule (100 mg total) by mouth every 8 (eight) hours. 21 capsule 0   BESIVANCE 0.6 % SUSP      Blood Glucose Monitoring Suppl (ONETOUCH VERIO) w/Device KIT Use device to check blood glucose levels as directed 1 kit 0   cetirizine (ZYRTEC) 10 MG tablet Take 10 mg by mouth daily.     cholecalciferol (VITAMIN D) 1000 units tablet Take 2,000 Units by mouth at bedtime.     clobetasol ointment (TEMOVATE) 0.05 % APPLY TOPICALLY TWICE A DAY X 4 WEEKS, THEN ONCE A DAY X 4 WEEKS, THEN TWICE A WEEK. 30 g 2   cyclobenzaprine  (FLEXERIL) 10 MG tablet Take 0.5-1 tablets (5-10 mg total) by mouth 3 (three) times daily as needed for muscle spasms. May make you sleepy. Start at bedtime. 60 tablet 3   Dapagliflozin Pro-metFORMIN ER (XIGDUO XR) 06-998 MG TB24 Take 2 tablets by mouth daily. 60 tablet 2   diclofenac Sodium (VOLTAREN) 1 % GEL Apply 2 g topically 4 (  four) times daily. 150 g 2   Fluticasone-Umeclidin-Vilant (TRELEGY ELLIPTA) 200-62.5-25 MCG/ACT AEPB Once a day. 14 each 0   glucose blood (FREESTYLE LITE) test strip 1 each by Other route 2 (two) times daily. And lancets 2/day 200 each 3   glucose blood (ONETOUCH VERIO) test strip Use as instructed to check blood sugar 2X daily 100 each 0   Insulin Glargine (BASAGLAR KWIKPEN) 100 UNIT/ML Inject 40 Units into the skin every morning. 45 mL 3   levocetirizine (XYZAL) 2.5 MG/5ML solution Take 10 mLs (5 mg total) by mouth every evening. 148 mL 12   metoprolol succinate (TOPROL-XL) 50 MG 24 hr tablet Take 1 tablet (50 mg total) by mouth daily. Take with or immediately following a meal. 30 tablet 3   mometasone (ELOCON) 0.1 % cream Apply a thin layer to the skin once a day for rash. 15 g 1   Olopatadine HCl 0.2 % SOLN Place 2-4 drops in the eyes every 4-6 hours as needed for allergy symptoms. 2.5 mL 3   Omega-3 Fatty Acids (FISH OIL PO) Take 1 capsule by mouth at bedtime. Reported on 08/08/2015     omeprazole (PRILOSEC) 40 MG capsule TAKE 1 CAPSULE BY MOUTH EVERY DAY 30 capsule 0   ondansetron (ZOFRAN) 4 MG tablet Take 4 mg by mouth every 8 (eight) hours as needed for nausea or vomiting.     rivaroxaban (XARELTO) 10 MG TABS tablet TAKE 1 TABLET (10 MG TOTAL) BY MOUTH DAILY 30 tablet 3   simvastatin (ZOCOR) 20 MG tablet TAKE 1 TABLET BY MOUTH EVERYDAY AT BEDTIME 90 tablet 1   tirzepatide (MOUNJARO) 10 MG/0.5ML Pen Inject 10 mg into the skin once a week. 6 mL 3   valsartan-hydrochlorothiazide (DIOVAN-HCT) 160-25 MG tablet Take 1 tablet by mouth daily. 30 tablet 3   No current  facility-administered medications for this visit.    REVIEW OF SYSTEMS:    10 Point review of Systems was done is negative except as noted above.  PHYSICAL EXAMINATION: ECOG PERFORMANCE STATUS: 2 - Symptomatic, <50% confined to bed  . Vitals:   04/21/23 1449  BP: (!) 153/81  Pulse: 100  Resp: 18  Temp: 97.9 F (36.6 C)  SpO2: 100%   Filed Weights   04/21/23 1449  Weight: 238 lb 14.4 oz (108.4 kg)                                                                                                                                                                                                                                                                                                                                                                                                                                                                                                                                                                                                                                                                                                                                                                                                                                                                                                                                                                                                                                                                                                                                                                                                                                                                                                                                                                                                                                                                                                                                                                                                                                                                                                                                                                                                                                                                                                                                                                                                                                                                                                                                                                                                                                                                                                                                                                                                                                                                                                                                                                                                                                                                                                                                                                                                                                                                                                                                                                                                                                                                                                                                                                                                                                                .  Body mass index is 42.32 kg/m.  GENERAL:alert, in no acute distress and comfortable SKIN: no acute rashes, no significant lesions EYES: conjunctiva are pink and non-injected, sclera anicteric OROPHARYNX: MMM, no exudates, no oropharyngeal erythema or ulceration NECK: supple, no JVD LYMPH:  no palpable lymphadenopathy in the cervical, axillary or  inguinal regions LUNGS: clear to auscultation b/l with normal respiratory effort HEART: regular rate & rhythm ABDOMEN:  normoactive bowel sounds , non tender, not distended. Extremity: no pedal edema PSYCH: alert & oriented x 3 with fluent speech NEURO: no focal motor/sensory deficits  LABORATORY DATA:  I have reviewed the data as listed  .    Latest Ref Rng & Units 04/21/2023    3:25 PM 03/11/2023    4:00 PM 10/26/2019    2:40 PM  CBC  WBC 4.0 - 10.5 K/uL 6.9  5.5  6.7   Hemoglobin 12.0 - 15.0 g/dL 16.1  09.6  04.5   Hematocrit 36.0 - 46.0 % 43.9  47.4  42.7   Platelets 150 - 400 K/uL 142  182  252     .    Latest Ref Rng & Units 04/21/2023    3:25 PM 03/11/2023    4:00 PM 11/07/2022    2:01 PM  CMP  Glucose 70 - 99 mg/dL 409  811  914   BUN 6 - 20 mg/dL 12  13  12    Creatinine 0.44 - 1.00 mg/dL 7.82  9.56  2.13   Sodium 135 - 145 mmol/L 138  137  137   Potassium 3.5 - 5.1 mmol/L 4.1  4.5  4.2   Chloride 98 - 111 mmol/L 103  99  102   CO2 22 - 32 mmol/L 30  23  27    Calcium 8.9 - 10.3 mg/dL 9.4  9.4  9.7   Total Protein 6.5 - 8.1 g/dL 7.4  7.7    Total Bilirubin 0.0 - 1.2 mg/dL 0.3  <0.8    Alkaline Phos 38 - 126 U/L 84  125    AST 15 - 41 U/L 22  34    ALT 0 - 44 U/L 20  33       RADIOGRAPHIC STUDIES: I have personally reviewed the radiological images as listed and agreed with the findings in the report. US PELVIC COMPLETE WITH TRANSVAGINAL Result Date: 04/08/2023 Both transabdominal and transvaginal techniques were necessary:  Anteverted uterus, multiple intramural fibroids noted -  largest measuring 53 mm (was 61 mm in 2021), displaces the endometrium posteriorly.  Thin, symmetrical endometrium - 2.26 mm.  No masses or thickening seen, avascular.  Both ovaries atrophic in size.  No adnexal masses, no free fluid.   CT HEMATURIA WORKUP Result Date: 03/30/2023 CLINICAL DATA:  Gross hematuria EXAM: CT ABDOMEN AND PELVIS WITHOUT AND WITH CONTRAST TECHNIQUE: Multidetector  CT imaging of the abdomen and pelvis was performed following the standard protocol before and following the bolus administration of intravenous contrast. RADIATION DOSE REDUCTION: This exam was performed according to the departmental dose-optimization program which includes automated exposure control, adjustment of the mA and/or kV according to patient size and/or use of iterative reconstruction technique. CONTRAST:  OMNIPAQUE IOHEXOL 350 MG/ML SOLN COMPARISON:  CT abdomen 12/15/2004.  CT pelvis 02/08/2004 FINDINGS: Lower chest: Mild emphysematous changes in the lung bases. Hepatobiliary: Hepatic cirrhosis with nodular contour and enlarged lateral segment left and caudate lobes. No focal lesions are identified. Gallbladder is surgically absent. No bile duct dilatation. Pancreas: Unremarkable. No pancreatic ductal dilatation  or surrounding inflammatory changes. Spleen: Spleen is atrophic. Adrenals/Urinary Tract: No adrenal gland nodules. Nephrograms are homogeneous and symmetrical. No hydronephrosis or hydroureter. Ureters are well opacified. No ureteral stone or filling defect. No bladder wall thickening or focal lesion. No abnormality is identified that would account for hematuria. Stomach/Bowel: Stomach is within normal limits. Appendix appears normal. No evidence of bowel wall thickening, distention, or inflammatory changes. Vascular/Lymphatic: No significant vascular findings are present. No enlarged abdominal or pelvic lymph nodes. Reproductive: Uterus is anteverted and appears somewhat enlarged, likely fibroid. No abnormal adnexal masses. Other: No free air or free fluid in the abdomen. Abdominal wall musculature appears intact. Musculoskeletal: No acute or significant osseous findings. IMPRESSION: 1. Normal appearance of the kidneys, ureters, and bladder. No abnormality is identified which would account for history of hematuria. 2. Hepatic cirrhosis. 3. Splenic atrophy. 4. Fibroid uterus. Electronically  Signed   By: Burman Nieves M.D.   On: 03/30/2023 18:38   DG Chest 2 View Result Date: 03/30/2023 CLINICAL DATA:  Shortness of breath and cough for 1 week. Productive cough, sneezing, shortness of breath, nasal drainage, and ribcage pain. EXAM: CHEST - 2 VIEW COMPARISON:  08/15/2018 FINDINGS: Heart size and pulmonary vascularity are normal. Lungs are clear. No pleural effusion or pneumothorax. Mediastinal contours appear intact. Surgical clips in the right upper quadrant. IMPRESSION: No active cardiopulmonary disease. Electronically Signed   By: Burman Nieves M.D.   On: 03/30/2023 18:28    ASSESSMENT & PLAN:  Acute DVT - has been taking blood thinner.  Elevated RBC.   PLAN: -Discussed the reason for today's visit with the patient.  -Discussed with the patient that her Hct is elevated with elevated RBC of 6.29 but Hgb in the normal range.  -Discussed with the patient that dehydration could cause elevated Hct. She is on medications which could cause dehydration. Discussed the importance of staying well-hydrated, around 2-3 L water and avoid/reduce caffeine intake.  -Educated the patient regarding polycythemia vera, alpha-thalassemia, and minor thalassemia trait. -Discussed with the patient that the next plan is get lab-workup during this visit with genetic testing. Pt agrees.  -Continue the current dose of her blood thinner, life-long.  -Answered all of patient's questions.   . Orders Placed This Encounter  Procedures   CBC with Differential (Cancer Center Only)    Standing Status:   Future    Number of Occurrences:   1    Expected Date:   04/21/2023    Expiration Date:   04/20/2024   CMP (Cancer Center only)    Standing Status:   Future    Number of Occurrences:   1    Expected Date:   04/21/2023    Expiration Date:   04/20/2024   Ferritin    Standing Status:   Future    Number of Occurrences:   1    Expected Date:   04/21/2023    Expiration Date:   04/20/2024   Iron and Iron  Binding Capacity ([facility] only)    Standing Status:   Future    Number of Occurrences:   1    Expected Date:   04/21/2023    Expiration Date:   04/20/2024   JAK2 V617F rfx CALR/MPL/E12-15    Standing Status:   Future    Number of Occurrences:   1    Expected Date:   04/21/2023    Expiration Date:   04/20/2024   Hgb Fractionation Cascade    Standing Status:   Future  Number of Occurrences:   1    Expected Date:   04/21/2023    Expiration Date:   04/20/2024   Alpha-Thalassemia GenotypR    Standing Status:   Future    Number of Occurrences:   1    Expected Date:   04/21/2023    Expiration Date:   04/20/2024    FOLLOW-UP: Labs today Phone visit with Dr Candise Che in 2 weeks   .The total time spent in the appointment was 30 minutes* .  All of the patient's questions were answered with apparent satisfaction. The patient knows to call the clinic with any problems, questions or concerns.   Wyvonnia Lora MD MS AAHIVMS [facility] Hematology/Oncology Physician [facility]  .*Total Encounter Time as defined by the Centers for Medicare and Medicaid Services includes, in addition to the face-to-face time of a patient visit (documented in the note above) non-face-to-face time: obtaining and reviewing outside history, ordering and reviewing medications, tests or procedures, care coordination (communications with other health care professionals or caregivers) and documentation in the medical record.   04/21/2023 12:04 PM   I,Param Shah,acting as a scribe for Wyvonnia Lora, MD.,have documented all relevant documentation on the behalf of Wyvonnia Lora, MD,as directed by  Wyvonnia Lora, MD while in the presence of Wyvonnia Lora, MD.

## 2023-04-21 NOTE — Progress Notes (Addendum)
      ENDOMETRIAL BIOPSY       Victoria Padilla 58 y.o. presents for endometrial biopsy. Reason for biopsy: bleeding.   The indications for endometrial biopsy were reviewed.    Risks of the biopsy including cramping, bleeding, infection, uterine perforation, inadequate specimen and need for additional procedures  were discussed.  The patient states she understands and agrees to undergo procedure today. Consent obtained.  Time out was performed.   Procedure Speculum inserted into the vagina, cervix visualized and was prepped with Betadine. A single-toothed tenaculum was placed on the anterior lip of the cervix to stabilize it.  The 3 mm pipelle was introduced into the endometrial cavity without difficulty to a depth of 8 cm, suction initiated and a moderate amount of tissue was obtained and sent to pathology.  The instruments were removed from the patient's vagina.  Minimal bleeding from the cervix was noted.  The patient tolerated the procedure well.   Jodelle Red, CMA present for exam  Assessment/Plan: PMB (postmenopausal bleeding) - Plan: Endometrial biopsy, Surgical pathology   Routine post-procedure instructions were given to the patient.   Will contact with results of biopsy.    Wyline Beady, DNP 04/21/2023 09:39

## 2023-04-22 ENCOUNTER — Telehealth: Payer: Self-pay | Admitting: Hematology

## 2023-04-22 LAB — FERRITIN: Ferritin: 98 ng/mL (ref 11–307)

## 2023-04-22 NOTE — Telephone Encounter (Signed)
 spoke with patient confirming upcoming appointment

## 2023-04-23 ENCOUNTER — Encounter: Payer: Self-pay | Admitting: Nurse Practitioner

## 2023-04-23 LAB — SURGICAL PATHOLOGY

## 2023-04-24 LAB — HGB FRACTIONATION CASCADE
Hgb A2: 2.1 % (ref 1.8–3.2)
Hgb A: 97.9 % (ref 96.4–98.8)
Hgb F: 0 % (ref 0.0–2.0)
Hgb S: 0 %

## 2023-04-28 LAB — JAK2 V617F RFX CALR/MPL/E12-15

## 2023-04-28 LAB — CALR +MPL + E12-E15  (REFLEX)

## 2023-05-01 LAB — ALPHA-THALASSEMIA GENOTYPR

## 2023-05-06 ENCOUNTER — Inpatient Hospital Stay: Payer: 59 | Attending: Hematology | Admitting: Hematology

## 2023-05-06 DIAGNOSIS — Z86718 Personal history of other venous thrombosis and embolism: Secondary | ICD-10-CM | POA: Diagnosis not present

## 2023-05-06 DIAGNOSIS — D751 Secondary polycythemia: Secondary | ICD-10-CM

## 2023-05-06 DIAGNOSIS — D563 Thalassemia minor: Secondary | ICD-10-CM | POA: Diagnosis not present

## 2023-05-06 DIAGNOSIS — R718 Other abnormality of red blood cells: Secondary | ICD-10-CM

## 2023-05-06 NOTE — Progress Notes (Signed)
 HEMATOLOGY/ONCOLOGY TELE-MED VISIT NOTE  Date of Service: 05/06/2023  Patient Care Team: Early, Sung Amabile, NP as PCP - General (Nurse Practitioner) Nahser, Deloris Ping, MD as PCP - Cardiology (Cardiology)  CHIEF COMPLAINTS/PURPOSE OF CONSULTATION:  Alpha-Thalassemia minor   HISTORY OF PRESENTING ILLNESS:   Victoria Padilla is a wonderful 58 y.o. female who has been referred to [place] by NP Enid Skeens for evaluation and management of elevated RBC. Recent lab from 03/11/2023 showed elevated RBC of 6.29 and elevated hematocrit of 47.4%. RBC has been elevated for a while. Lab from 10/26/2019 showed elevated RBC of 5.74.   Patient used to follow-up with [place] in 2020 for an acute pulmonary embolism/DVT and her last visit with [place] was on 01/11/2019. Patient had a repeat VAS [place] Lower extremity which showed resolved DVT. She was discharged and was recommended to continue blood-thinners long-term due to no known provoking event and extensive clots.   Patient notes she recently had an upper respiratory infection and Pneumonia. She notes that her energy levels have been fluctuating since her infection. During this visit, she notes that her upper respiratory symptoms have improved, but complains of mild cough.   Patient reports staying well-hydrated, drinking around 2 L of water daily. She currently drinks 3 cups of green tea mixed with coffee. She denies drinking soda.   Patient complains of night sweats, which she attributes to menopause. She denies any fever, chills, back pain, chest pain, abdominal pain, or leg swelling. She does report weight loss due to her recent infection and Pneumonia.   She notes she did not take her blood thinner for one year due her PCP. She has a new PCP now. She had an Ultra-sound with her PCP, which showed new small DVT. Patient has started taking her blood thinner again.   She denies any FmHx of blood disorder nor thalassemia disorder.   She denies smoking cigarettes.    INTERVAL HISTORY:  Victoria Padilla is a wonderful 58 y.o. female who has been referred to [place] by NP Enid Skeens for evaluation and management of Alpha-thalassemia minor. Patient was initially seen by me on 04/21/2023.   .I connected with Victoria Padilla on 05/06/2023 at  3:30 PM EDT by telephone visit and verified that I am speaking with the correct person using two identifiers.   Patient notes she has been doing well overall since our last visit. She denies any new infection issues, fever, chills, night sweats, unexpected weight loss, back pain, chest pain, abdominal pain, or leg swelling.   Discussed lab results from 04/21/2023 in detail with the patient.   I discussed the limitations, risks, security and privacy concerns of performing an evaluation and management service by telemedicine and the availability of in-person appointments. I also discussed with the patient that there may be a patient responsible charge related to this service. The patient expressed understanding and agreed to proceed.   Other persons participating in the visit and their role in the encounter: None   Patient's location: Home   Provider's location: [facility]   Chief Complaint: Alpha-thalassemia minor     MEDICAL HISTORY:  Past Medical History:  Diagnosis Date   Acid reflux    Acute non-recurrent frontal sinusitis 06/17/2022   Anemia    Arthritis    Asthma    Cervical dysplasia    Diabetes mellitus    DVT (deep venous thrombosis) (HCC)    Gallbladder problem    GERD (gastroesophageal reflux disease)  Heart murmur    Hx of blood clots    Hyperlipidemia    Hypertension    LBP (low back pain)    Liver problem    Obesity    Orthopnea 02/13/2018   OSA (obstructive sleep apnea) 03/22/2018   New diagnosis. Mild.    Personal history of noncompliance with medical treatment, presenting hazards to health 12/21/2015   Pulmonary embolism (HCC)    Rash and nonspecific skin eruption 06/17/2022   Reactive  airway disease, mild intermittent, with acute exacerbation 06/17/2022   Sarcoidosis    Sleep apnea    Vitamin D deficiency     SURGICAL HISTORY: Past Surgical History:  Procedure Laterality Date   BREAST BIOPSY Left 2019   BUNIONECTOMY     CHOLECYSTECTOMY     COLPOSCOPY     CT SCAN  2025   GYNECOLOGIC CRYOSURGERY     KNEE ARTHROSCOPY     TUBAL LIGATION      SOCIAL HISTORY: Social History   Socioeconomic History   Marital status: Single    Spouse name: Not on file   Number of children: 3   Years of education: Not on file   Highest education level: Not on file  Occupational History   Occupation: Tax adviser: [employer]  Tobacco Use   Smoking status: Never   Smokeless tobacco: Never  Vaping Use   Vaping status: Never Used  Substance and Sexual Activity   Alcohol use: No   Drug use: No   Sexual activity: Not Currently    Birth control/protection: Surgical  Other Topics Concern   Not on file  Social History Narrative   Not on file   Social Drivers of Health   Financial Resource Strain: Not on file  Food Insecurity: Not on file  Transportation Needs: Not on file  Physical Activity: Not on file  Stress: Not on file  Social Connections: Not on file  Intimate Partner Violence: Not on file    FAMILY HISTORY: Family History  Problem Relation Age of Onset   Heart failure Mother 84       chf   Diabetes Mother    High blood pressure Mother    Heart disease Mother    Sudden death Mother    Kidney disease Mother    Obesity Mother    Heart failure Father    Heart disease Father    Sudden death Father    Alcoholism Father    Cancer Brother 39       prostate   Hypertension Maternal Uncle    Hyperlipidemia Brother    Diabetes Brother    Colon cancer Maternal Grandfather    Colon polyps Brother    Diabetes Brother    Hyperlipidemia Brother    Stomach cancer Neg Hx    Esophageal cancer Neg Hx    Rectal cancer Neg Hx    Liver  cancer Neg Hx    Breast cancer Neg Hx     ALLERGIES:  is allergic to naprosyn [naproxen].  MEDICATIONS:  Current Outpatient Medications  Medication Sig Dispense Refill   Albuterol-Budesonide (AIRSUPRA) 90-80 MCG/ACT AERO Inhale 1-2 Inhalations into the lungs every 4 (four) hours. As needed for cough, wheeze, shortness of breath. 10.7 g 0   amLODipine (NORVASC) 5 MG tablet Take 1 tablet (5 mg total) by mouth daily. 90 tablet 1   azithromycin (ZITHROMAX) 250 MG tablet Take first 2 tablets together, then 1 every day until finished. 6 tablet  0   B-D ULTRAFINE III SHORT PEN 31G X 8 MM MISC USE TO INJECT BASAGLAR DAILY- NEED APPT 100 each 0   benzonatate (TESSALON) 100 MG capsule Take 1 capsule (100 mg total) by mouth every 8 (eight) hours. 21 capsule 0   BESIVANCE 0.6 % SUSP      Blood Glucose Monitoring Suppl (ONETOUCH VERIO) w/Device KIT Use device to check blood glucose levels as directed 1 kit 0   cetirizine (ZYRTEC) 10 MG tablet Take 10 mg by mouth daily.     cholecalciferol (VITAMIN D) 1000 units tablet Take 2,000 Units by mouth at bedtime.     clobetasol ointment (TEMOVATE) 0.05 % APPLY TOPICALLY TWICE A DAY X 4 WEEKS, THEN ONCE A DAY X 4 WEEKS, THEN TWICE A WEEK. 30 g 2   cyclobenzaprine (FLEXERIL) 10 MG tablet Take 0.5-1 tablets (5-10 mg total) by mouth 3 (three) times daily as needed for muscle spasms. May make you sleepy. Start at bedtime. 60 tablet 3   Dapagliflozin Pro-metFORMIN ER (XIGDUO XR) 06-998 MG TB24 Take 2 tablets by mouth daily. 60 tablet 2   diclofenac Sodium (VOLTAREN) 1 % GEL Apply 2 g topically 4 (four) times daily. 150 g 2   Fluticasone-Umeclidin-Vilant (TRELEGY ELLIPTA) 200-62.5-25 MCG/ACT AEPB Once a day. 14 each 0   glucose blood (FREESTYLE LITE) test strip 1 each by Other route 2 (two) times daily. And lancets 2/day 200 each 3   glucose blood (ONETOUCH VERIO) test strip Use as instructed to check blood sugar 2X daily 100 each 0   Insulin Glargine (BASAGLAR KWIKPEN)  100 UNIT/ML Inject 40 Units into the skin every morning. 45 mL 3   levocetirizine (XYZAL) 2.5 MG/5ML solution Take 10 mLs (5 mg total) by mouth every evening. 148 mL 12   metoprolol succinate (TOPROL-XL) 50 MG 24 hr tablet Take 1 tablet (50 mg total) by mouth daily. Take with or immediately following a meal. 30 tablet 3   mometasone (ELOCON) 0.1 % cream Apply a thin layer to the skin once a day for rash. 15 g 1   Olopatadine HCl 0.2 % SOLN Place 2-4 drops in the eyes every 4-6 hours as needed for allergy symptoms. 2.5 mL 3   Omega-3 Fatty Acids (FISH OIL PO) Take 1 capsule by mouth at bedtime. Reported on 08/08/2015     omeprazole (PRILOSEC) 40 MG capsule TAKE 1 CAPSULE BY MOUTH EVERY DAY 30 capsule 0   ondansetron (ZOFRAN) 4 MG tablet Take 4 mg by mouth every 8 (eight) hours as needed for nausea or vomiting.     rivaroxaban (XARELTO) 10 MG TABS tablet TAKE 1 TABLET (10 MG TOTAL) BY MOUTH DAILY 30 tablet 3   simvastatin (ZOCOR) 20 MG tablet TAKE 1 TABLET BY MOUTH EVERYDAY AT BEDTIME 90 tablet 1   tirzepatide (MOUNJARO) 10 MG/0.5ML Pen Inject 10 mg into the skin once a week. 6 mL 3   valsartan-hydrochlorothiazide (DIOVAN-HCT) 160-25 MG tablet Take 1 tablet by mouth daily. 30 tablet 3   No current facility-administered medications for this visit.    REVIEW OF SYSTEMS:    10 Point review of Systems was done is negative except as noted above.  PHYSICAL EXAMINATION: Telemedicine visit  LABORATORY DATA:  I have reviewed the data as listed  .    Latest Ref Rng & Units 04/21/2023    3:25 PM 03/11/2023    4:00 PM 10/26/2019    2:40 PM  CBC  WBC 4.0 - 10.5 K/uL 6.9  5.5  6.7   Hemoglobin 12.0 - 15.0 g/dL 16.1  09.6  04.5   Hematocrit 36.0 - 46.0 % 43.9  47.4  42.7   Platelets 150 - 400 K/uL 142  182  252     .    Latest Ref Rng & Units 04/21/2023    3:25 PM 03/11/2023    4:00 PM 11/07/2022    2:01 PM  CMP  Glucose 70 - 99 mg/dL 409  811  914   BUN 6 - 20 mg/dL 12  13  12    Creatinine  0.44 - 1.00 mg/dL 7.82  9.56  2.13   Sodium 135 - 145 mmol/L 138  137  137   Potassium 3.5 - 5.1 mmol/L 4.1  4.5  4.2   Chloride 98 - 111 mmol/L 103  99  102   CO2 22 - 32 mmol/L 30  23  27    Calcium 8.9 - 10.3 mg/dL 9.4  9.4  9.7   Total Protein 6.5 - 8.1 g/dL 7.4  7.7    Total Bilirubin 0.0 - 1.2 mg/dL 0.3  <0.8    Alkaline Phos 38 - 126 U/L 84  125    AST 15 - 41 U/L 22  34    ALT 0 - 44 U/L 20  33         Hgb Fractionation Cascade Order: [id number]  Status: Final result     Next appt: 06/19/2023 at 02:45 PM in Family Medicine Tollie Eth, NP)     Dx: Polycythemia; RBC microcytosis   Test Result Released: Yes (not seen)   0 Result Notes    Component Ref Range & Units (hover) 2 wk ago  Hgb F 0.0  Hgb A 97.9  Hgb A2 2.1  Hgb S 0.0  Interpretation, Hgb Fract Comment  Comment: (NOTE) Normal hemoglobin present; no hemoglobin variant or beta thalassemia identified. Note: Alpha thalassemia may not be detected by the Hgb Fractionation Cascade panel. If alpha thalassemia is suspected, [facility] offers Alpha-Thalassemia DNA Analysis [phone]). Performed At: [facility]     Alpha-Thalassemia GenotypR Order: [id number]  Status: Final result     Next appt: 06/19/2023 at 02:45 PM in Family Medicine Sung Amabile Early, NP)     Dx: Polycythemia; RBC microcytosis   Test Result Released: Yes (not seen)   0 Result Notes    Component Ref Range & Units (hover) 2 wk ago  Alpha-Thalassemia Comment:  Comment: (NOTE) Test: Alpha-Thalassemia, DNA Analysis Result:     Alpha-thalassemia minor, alpha-/alpha- Mutation(s) identified:  alpha3.7 and alpha3.7 Interpretation: This result is most consistent with this individual being an unaffected carrier of alpha-thalassemia with two of the four alpha-globin genes deleted (Alpha-thalassemia minor). The two remaining alpha-globin genes are arranged in trans, one copy located on each chromosome 16 homolog. Individuals  with alpha-thalassemia minor can demonstrate microcytosis, hypocromia, and normal levels of HbA2 and HbF. However, they typically have no significant clinical findings.     RADIOGRAPHIC STUDIES: I have personally reviewed the radiological images as listed and agreed with the findings in the report. US PELVIC COMPLETE WITH TRANSVAGINAL Result Date: 04/08/2023 Both transabdominal and transvaginal techniques were necessary:  Anteverted uterus, multiple intramural fibroids noted -  largest measuring 53 mm (was 61 mm in 2021), displaces the endometrium posteriorly.  Thin, symmetrical endometrium - 2.26 mm.  No masses or thickening seen, avascular.  Both ovaries atrophic in size.  No adnexal masses, no free fluid.    ASSESSMENT & PLAN:  Acute DVT - has been taking blood thinner.  Alpha-Thalassemia minor.    PLAN: -Discussed lab results from 04/21/2023 in detail with the patient. CBC shows low platelets of 142 K. Normal Hgb, but elevated RBC of 6.06 due to Alpha-Thalassemia minor. CMP stable. Hgb Fractionation cascade results did not show hemoglobin variant or beta thalassemia. Iron saturation of 16. -Alpha-thalassemia GenotypR results from 04/21/2023 showed positive for Alpha-Thalassemia minor.  -Mutation testing from 04/21/2023 did not show JAK-2 mutation. Negative for CALR/MPL/E12-15.  -Discussed Endometrium biopsy results from 04/21/2023 was negative for hyperplasia, atypia or malignancy.  -Educated the patient on the diagnosis of Alpha-Thalassemia minor. Educated the patient that Alpha-Thalassemia minor will not produce any physical symptoms. Base-line Hgb for patients with this condition is around 12-13 g/dL.  -Discussed with the patient that Alpha-Thalassemia minor is inherited.  -Patient does not require any treatment for Alpha-Thalassemia minor. -Answered all of patient's questions.  -Continue the current dose of her blood thinner, life-long.  -Recommend to start B-Complex.  . No  orders of the defined types were placed in this encounter.   FOLLOW-UP: RTC with PCP  The total time spent in the appointment was 20 minutes* .  All of the patient's questions were answered with apparent satisfaction. The patient knows to call the clinic with any problems, questions or concerns.   Wyvonnia Lora MD MS AAHIVMS [facility] Hematology/Oncology Physician [facility]  .*Total Encounter Time as defined by the Centers for Medicare and Medicaid Services includes, in addition to the face-to-face time of a patient visit (documented in the note above) non-face-to-face time: obtaining and reviewing outside history, ordering and reviewing medications, tests or procedures, care coordination (communications with other health care professionals or caregivers) and documentation in the medical record.   I,Param Shah,acting as a Neurosurgeon for Wyvonnia Lora, MD.,have documented all relevant documentation on the behalf of Wyvonnia Lora, MD,as directed by  Wyvonnia Lora, MD while in the presence of Wyvonnia Lora, MD.  .I have reviewed the above documentation for accuracy and completeness, and I agree with the above. Johney Maine MD

## 2023-06-19 ENCOUNTER — Encounter: Payer: 59 | Admitting: Nurse Practitioner

## 2023-06-24 ENCOUNTER — Other Ambulatory Visit: Payer: Self-pay | Admitting: Nurse Practitioner

## 2023-06-24 ENCOUNTER — Other Ambulatory Visit: Payer: Self-pay | Admitting: Endocrinology

## 2023-06-24 DIAGNOSIS — K219 Gastro-esophageal reflux disease without esophagitis: Secondary | ICD-10-CM

## 2023-06-24 DIAGNOSIS — E1165 Type 2 diabetes mellitus with hyperglycemia: Secondary | ICD-10-CM

## 2023-06-25 MED ORDER — OMEPRAZOLE 40 MG PO CPDR: 40.0000 mg | DELAYED_RELEASE_CAPSULE | Freq: Every day | ORAL | 2 refills | Status: DC

## 2023-06-25 NOTE — Addendum Note (Signed)
 Addended by: Charliene Conte A on: 06/25/2023 09:46 AM   Modules accepted: Orders

## 2023-06-26 ENCOUNTER — Other Ambulatory Visit: Payer: Self-pay

## 2023-07-02 ENCOUNTER — Other Ambulatory Visit: Payer: Self-pay

## 2023-07-02 DIAGNOSIS — E1165 Type 2 diabetes mellitus with hyperglycemia: Secondary | ICD-10-CM

## 2023-07-02 MED ORDER — BD PEN NEEDLE SHORT U/F 31G X 8 MM MISC: 0 refills | Status: DC

## 2023-07-02 NOTE — Telephone Encounter (Signed)
 Requested Prescriptions   Signed Prescriptions Disp Refills   Insulin  Pen Needle (B-D ULTRAFINE III SHORT PEN) 31G X 8 MM MISC 100 each 0    Sig: Use one needle with each dose.    Authorizing Provider: Jorge Newcomer    Ordering User: Waneta Gut

## 2023-08-15 ENCOUNTER — Other Ambulatory Visit: Payer: Self-pay | Admitting: Nurse Practitioner

## 2023-08-20 ENCOUNTER — Other Ambulatory Visit: Payer: Self-pay | Admitting: "Endocrinology

## 2023-08-20 DIAGNOSIS — E1165 Type 2 diabetes mellitus with hyperglycemia: Secondary | ICD-10-CM

## 2023-08-21 NOTE — Telephone Encounter (Signed)
 Requested Prescriptions   Pending Prescriptions Disp Refills   BD PEN NEEDLE SHORT ULTRAFINE 31G X 8 MM MISC [Pharmacy Med Name: BD UF SHORT PEN NEEDLE 8MMX31G] 100 each 0    Sig: USE ONE NEEDLE WITH EACH DOSE.

## 2023-09-15 NOTE — Progress Notes (Deleted)
 Last eye exam: Last PAP: Shingrix: Foot Exam: needed today

## 2023-09-16 ENCOUNTER — Encounter: Payer: 59 | Admitting: Nurse Practitioner

## 2023-09-22 ENCOUNTER — Encounter: Payer: Self-pay | Admitting: Nurse Practitioner

## 2023-10-20 ENCOUNTER — Encounter: Payer: Self-pay | Admitting: Nurse Practitioner

## 2023-10-20 ENCOUNTER — Ambulatory Visit: Admitting: Nurse Practitioner

## 2023-10-20 VITALS — BP 140/80 | HR 96 | Wt 242.6 lb

## 2023-10-20 DIAGNOSIS — R404 Transient alteration of awareness: Secondary | ICD-10-CM

## 2023-10-20 DIAGNOSIS — T7840XA Allergy, unspecified, initial encounter: Secondary | ICD-10-CM

## 2023-10-20 DIAGNOSIS — R1031 Right lower quadrant pain: Secondary | ICD-10-CM | POA: Diagnosis not present

## 2023-10-20 DIAGNOSIS — D563 Thalassemia minor: Secondary | ICD-10-CM | POA: Insufficient documentation

## 2023-10-20 DIAGNOSIS — I10 Essential (primary) hypertension: Secondary | ICD-10-CM

## 2023-10-20 DIAGNOSIS — K5904 Chronic idiopathic constipation: Secondary | ICD-10-CM | POA: Diagnosis not present

## 2023-10-20 MED ORDER — LUBIPROSTONE 24 MCG PO CAPS: 24.0000 ug | ORAL_CAPSULE | Freq: Two times a day (BID) | ORAL | 11 refills | Status: AC

## 2023-10-20 MED ORDER — OLOPATADINE HCL 0.2 % OP SOLN: OPHTHALMIC | 3 refills | Status: DC

## 2023-10-20 NOTE — Assessment & Plan Note (Signed)
 BP is elevated today, but patient is feeling stressed and experiencing pain which may be triggering the change. Recommend monitoring at home to evaluate for average blood pressure readings and notify if these are consistently higher than 130/80.

## 2023-10-20 NOTE — Progress Notes (Signed)
 Victoria FORBES Doing, DNP, AGNP-c [facility] [address] [place], [place] [address] [phone]   ACUTE VISIT on 10/20/2023  Blood pressure (!) 140/80, pulse 96, weight 242 lb 9.6 oz (110 kg).  Subjective:  HPI History of Present Illness Victoria Padilla is a 58 year old female with chronic constipation who presents with LLQ abdominal pain and dizziness.  She experiences throbbing abdominal pain located on the left side, occurring on average since onset, about once a day and lasting from a few hours to all day, with a severity of six or seven out of ten at its worst. The pain is accompanied by nausea, with no specific time of day or dietary factors associated. She has a history of chronic constipation, with bowel movements occurring less than once every five days, despite dietary efforts including eating greens, drinking water, and using lemon and ginger cleansers. Her last bowel movement was on Saturday, which she described as 'a pretty good one'. No loose stools, fever, chills, only constipation chronically. Since that BM she has not had any of the pain. Her family history includes a grandmother with diverticulitis.  There is no tenderness when pressure is applied to the abdomen.   She also mentions a sensation described as 'elevator drops' in her head, causing dizziness and a brief loss of focus. This has been occurring since November, happening sporadically without warning. No associated weakness or prolonged symptoms. The symptoms do not correlate with anything that she is aware of. Sometimes she is moving and other times she is sitting still. No known weakness, palpitations, CP, speech change, or focal deficits with this event. The episode lasts only a brief second before resolving spontaneously.    She has been experiencing some allergy symptoms recently, which is unusual for her.  ROS negative except for what is listed in HPI. History, Medications, Surgery, SDOH, and  Family History reviewed and updated as appropriate.  Objective:  Physical Exam Vitals and nursing note reviewed.  Constitutional:      General: She is not in acute distress.    Appearance: Normal appearance.  HENT:     Head: Normocephalic.     Right Ear: Ear canal and external ear normal.     Left Ear: Ear canal and external ear normal.     Ears:     Comments: Clear effusions present bilaterally     Nose: Congestion and rhinorrhea present.     Mouth/Throat:     Mouth: Mucous membranes are moist.     Pharynx: Oropharynx is clear.  Eyes:     General: Lids are normal. Vision grossly intact. Gaze aligned appropriately. No visual field deficit.    Extraocular Movements: Extraocular movements intact.     Right eye: No nystagmus.     Left eye: No nystagmus.     Conjunctiva/sclera: Conjunctivae normal.     Pupils: Pupils are equal, round, and reactive to light.  Cardiovascular:     Rate and Rhythm: Normal rate and regular rhythm.     Pulses: Normal pulses.     Heart sounds: Normal heart sounds.  Pulmonary:     Effort: Pulmonary effort is normal.     Breath sounds: Normal breath sounds.  Abdominal:     General: Bowel sounds are normal. There is no distension.     Palpations: Abdomen is soft. There is no mass.     Tenderness: There is no abdominal tenderness. There is right CVA tenderness. There is no left CVA tenderness, guarding or  rebound.  Musculoskeletal:        General: Normal range of motion.  Skin:    General: Skin is warm and dry.     Capillary Refill: Capillary refill takes less than 2 seconds.  Neurological:     Mental Status: She is alert and oriented to person, place, and time.     Sensory: No sensory deficit.     Motor: No weakness.     Coordination: Coordination normal.     Gait: Gait normal.  Psychiatric:        Mood and Affect: Mood normal.         Assessment & Plan:   Problem List Items Addressed This Visit     Benign essential HTN   BP is elevated  today, but patient is feeling stressed and experiencing pain which may be triggering the change. Recommend monitoring at home to evaluate for average blood pressure readings and notify if these are consistently higher than 130/80.       Colicky RLQ abdominal pain - Primary   Intermittent left lower quadrant abdominal pain with nausea, occurring approximately once a day and occasionally lasting all day. Pain not associated with bowel movements that she is aware of. No tenderness on examination. Discussed the possibility of stool accumulation causing pain and nausea, with relief typically following a bowel movement. Also consider diverticulosis. No alarm symptoms are present at this time. We discussed trial of amitiza  to help with chronic constipation to see if this relieves the pain. If pain returns, order for x-ray has been placed.  - Monitor symptoms and obtain abdominal x-ray if pain recurs or persists       Relevant Orders   DG Abd 2 Views   Chronic idiopathic constipation   Chronic constipation with bowel movements occurring less than once every five days. Previous use of Miralax during colon cleanse was ineffective. Consideration of chronic idiopathic constipation as a diagnosis. Discussed potential causes including stress and dietary factors. Amitiza  was discussed as a potential treatment option, with some patients experiencing increased bowel movements, requiring dose adjustments. - Prescribe Amitiza  for constipation management - Order abdominal x-ray if symptoms recur or persist after bowel movement      Relevant Medications   lubiprostone  (AMITIZA ) 24 MCG capsule   Awareness alteration, transient   Recurrent very brief episodes of dizziness and altered sensation described as 'elevator drops' in the head. Episodes are one second or less and not associated with specific triggers. Differential includes vertigo/vestibular dysfunction, aura, ocular mismatch, focal aware seizure, hypertension.  Fluid noted behind eardrums, possibly affecting equilibrium. Discussed the possibility of neurological causes and the need for further evaluation by neurology. - Consult neurology for evaluation of dizziness and altered sensation      Alpha thalassemia minor trait   Alpha thalassemia minor with two of four genes deleted, resulting in microcytosis and polycythemia. Condition is genetic and not causing harm. Discussed potential implications for family members and genetic counseling, especially for her children, due to potential inheritance patterns. - Consider genetic counseling for family members      Other Visit Diagnoses       Allergy, initial encounter       Relevant Medications   Olopatadine  HCl 0.2 % SOLN      .    Victoria FORBES Doing, DNP, AGNP-c Time: 56 minutes, >50% spent counseling, care coordination, chart review, and documentation.

## 2023-10-20 NOTE — Assessment & Plan Note (Signed)
 Chronic constipation with bowel movements occurring less than once every five days. Previous use of Miralax during colon cleanse was ineffective. Consideration of chronic idiopathic constipation as a diagnosis. Discussed potential causes including stress and dietary factors. Amitiza  was discussed as a potential treatment option, with some patients experiencing increased bowel movements, requiring dose adjustments. - Prescribe Amitiza  for constipation management - Order abdominal x-ray if symptoms recur or persist after bowel movement

## 2023-10-20 NOTE — Assessment & Plan Note (Signed)
 Recurrent very brief episodes of dizziness and altered sensation described as 'elevator drops' in the head. Episodes are one second or less and not associated with specific triggers. Differential includes vertigo/vestibular dysfunction, aura, ocular mismatch, focal aware seizure, hypertension. Fluid noted behind eardrums, possibly affecting equilibrium. Discussed the possibility of neurological causes and the need for further evaluation by neurology. - Consult neurology for evaluation of dizziness and altered sensation

## 2023-10-20 NOTE — Assessment & Plan Note (Signed)
 Alpha thalassemia minor with two of four genes deleted, resulting in microcytosis and polycythemia. Condition is genetic and not causing harm. Discussed potential implications for family members and genetic counseling, especially for her children, due to potential inheritance patterns. - Consider genetic counseling for family members

## 2023-10-20 NOTE — Patient Instructions (Addendum)
 I have sent the order for the x-ray to [facility] at [facility] you can walk in to have that done at any time your symptoms come back.  I have sent the Amitiza . This is twice a day for constipation.   I will reach out to the neurologist to see if they can give me some insight on what the symptoms in your head are from.   Check your BP at home and let me know if this is staying higher than 130/80 on average.

## 2023-10-20 NOTE — Assessment & Plan Note (Signed)
 Intermittent left lower quadrant abdominal pain with nausea, occurring approximately once a day and occasionally lasting all day. Pain not associated with bowel movements that she is aware of. No tenderness on examination. Discussed the possibility of stool accumulation causing pain and nausea, with relief typically following a bowel movement. Also consider diverticulosis. No alarm symptoms are present at this time. We discussed trial of amitiza  to help with chronic constipation to see if this relieves the pain. If pain returns, order for x-ray has been placed.  - Monitor symptoms and obtain abdominal x-ray if pain recurs or persists

## 2023-11-10 ENCOUNTER — Ambulatory Visit: Payer: Self-pay | Admitting: *Deleted
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
## 2023-11-11 ENCOUNTER — Encounter: Payer: Self-pay | Admitting: Family Medicine

## 2023-11-11 ENCOUNTER — Ambulatory Visit: Admitting: Family Medicine

## 2023-11-11 VITALS — BP 130/88 | HR 86 | Wt 241.4 lb

## 2023-11-11 DIAGNOSIS — M532X2 Spinal instabilities, cervical region: Secondary | ICD-10-CM

## 2023-11-11 DIAGNOSIS — M47812 Spondylosis without myelopathy or radiculopathy, cervical region: Secondary | ICD-10-CM

## 2023-11-11 MED ORDER — MELOXICAM 15 MG PO TABS: 15.0000 mg | ORAL_TABLET | Freq: Every day | ORAL | 0 refills | Status: DC
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
## 2023-11-11 NOTE — Patient Instructions (Addendum)
   3 sets, 20 seconds each set.

## 2023-11-24 ENCOUNTER — Other Ambulatory Visit: Payer: Self-pay | Admitting: Nurse Practitioner

## 2023-11-24 ENCOUNTER — Ambulatory Visit
Admission: RE | Admit: 2023-11-24 | Discharge: 2023-11-24 | Disposition: A | Source: Ambulatory Visit | Attending: Nurse Practitioner

## 2023-11-24 DIAGNOSIS — Z1231 Encounter for screening mammogram for malignant neoplasm of breast: Secondary | ICD-10-CM

## 2023-11-28 ENCOUNTER — Ambulatory Visit: Payer: Self-pay | Admitting: Nurse Practitioner

## 2023-12-23 ENCOUNTER — Encounter: Payer: Self-pay | Admitting: Family Medicine

## 2023-12-23 ENCOUNTER — Ambulatory Visit
Admission: RE | Admit: 2023-12-23 | Discharge: 2023-12-23 | Disposition: A | Source: Ambulatory Visit | Attending: Family Medicine

## 2023-12-23 ENCOUNTER — Other Ambulatory Visit: Payer: Self-pay | Admitting: Family Medicine

## 2023-12-23 ENCOUNTER — Ambulatory Visit: Admitting: Family Medicine

## 2023-12-23 VITALS — BP 136/88 | HR 85 | Wt 236.8 lb

## 2023-12-23 DIAGNOSIS — G8929 Other chronic pain: Secondary | ICD-10-CM

## 2023-12-23 DIAGNOSIS — M532X2 Spinal instabilities, cervical region: Secondary | ICD-10-CM

## 2023-12-23 DIAGNOSIS — M25561 Pain in right knee: Secondary | ICD-10-CM | POA: Diagnosis not present

## 2023-12-23 DIAGNOSIS — M47812 Spondylosis without myelopathy or radiculopathy, cervical region: Secondary | ICD-10-CM

## 2023-12-23 MED ORDER — MELOXICAM 15 MG PO TABS: 15.0000 mg | ORAL_TABLET | Freq: Every day | ORAL | 0 refills | Status: DC

## 2023-12-23 MED ORDER — DICLOFENAC SODIUM 1 % EX GEL: 2.0000 g | Freq: Four times a day (QID) | CUTANEOUS | 2 refills | Status: AC

## 2023-12-23 NOTE — Patient Instructions (Signed)
 Please go get your xrays done at [facility]. You do not need to make an appointment. You can just show up.   Address: [address] [place], [place], [place] [address]

## 2023-12-23 NOTE — Progress Notes (Signed)
   Name: Victoria Padilla   Date of Visit: 12/23/23   Date of last visit with me: 11/11/2023   CHIEF COMPLAINT:  Chief Complaint  Patient presents with   Follow-up    6 week follow up. Necks feeling better. Knee pain, pain scale of 10.        HPI:  Discussed the use of AI scribe software for clinical note transcription with the patient, who gave verbal consent to proceed.  History of Present Illness   Victoria Padilla is a 58 year old female with arthritis who presents with right knee pain.  She has been experiencing right knee pain that began the weekend of December 04, 2023. The pain is described as a dull, aching sensation, rated as ten out of ten in severity. It has been present intermittently for years but has recently intensified. The pain typically worsens with rainy weather, but this recent episode is more severe than usual.  She has been using a topical concoction of castor oil and cayenne pepper, which provides some relief for the surface pain but does not alleviate the deeper pain. There is no mention of any prior x-rays for the knee.  In addition to the knee pain, she has a history of neck pain, for which she has used exercises and anti-inflammatory medications. She reports that these measures have helped with her neck symptoms.         OBJECTIVE:       03/11/2023    2:47 PM  Depression screen PHQ 2/9  Decreased Interest 0  Down, Depressed, Hopeless 0  PHQ - 2 Score 0     BP Readings from Last 3 Encounters:  12/23/23 136/88  11/11/23 130/88  10/20/23 (!) 140/80    BP 136/88   Pulse 85   Wt 236 lb 12.8 oz (107.4 kg)   SpO2 98%   BMI 41.95 kg/m    Physical Exam          Physical Exam  ASSESSMENT/PLAN:   Assessment & Plan Spondylosis of cervical region without myelopathy or radiculopathy  Spinal instability of cervical region  Chronic pain of right knee    Assessment and Plan    Right knee pain, suspected arthritis Chronic right knee pain,  likely arthritis, exacerbated by weather changes. - Order x-rays of the right knee. - Recommend use of a compression sleeve for the knee. - Schedule follow-up appointment in 2-3 weeks to review x-ray results.  Cervical spondylosis without myelopathy or radiculopathy Chronic cervical spondylosis. - Continue exercises for neck. - Continue anti-inflammatories as needed. - Refill anti-inflammatory prescription.         Karlene Southard A. Vita MD [facility]

## 2024-01-12 ENCOUNTER — Telehealth: Payer: Self-pay | Admitting: Nurse Practitioner

## 2024-01-12 DIAGNOSIS — I10 Essential (primary) hypertension: Secondary | ICD-10-CM

## 2024-01-12 NOTE — Telephone Encounter (Unsigned)
 Copied from CRM #[id number]. Topic: Clinical - Medication Refill >> Jan 12, 2024  1:45 PM Mercedes MATSU wrote: Medication:  amLODipine  (NORVASC ) 5 MG tablet metoprolol  succinate (TOPROL -XL) 50 MG 24 hr tablet Benazepril  40mg  rivaroxaban  (XARELTO ) 10 MG TABS tablet  Has the patient contacted their pharmacy? Yes (Agent: If no, request that the patient contact the pharmacy for the refill. If patient does not wish to contact the pharmacy document the reason why and proceed with request.) (Agent: If yes, when and what did the pharmacy advise?)  This is the patient's preferred pharmacy:  [id number] [address], [place] - [address] [place] [address] Phone: [phone] Fax: [phone]  [facility] [address] [place] [address] Phone: [phone] Fax: [phone]  Is this the correct pharmacy for this prescription? Yes If no, delete pharmacy and type the correct one.   Has the prescription been filled recently? Yes  Is the patient out of the medication? Yes  Has the patient been seen for an appointment in the last year OR does the patient have an upcoming appointment? Yes  Can we respond through MyChart? Yes  Agent: Please be advised that Rx refills may take up to 3 business days. We ask that you follow-up with your pharmacy.

## 2024-01-13 MED ORDER — METOPROLOL SUCCINATE ER 50 MG PO TB24: 50.0000 mg | ORAL_TABLET | Freq: Every day | ORAL | 5 refills | Status: DC

## 2024-01-13 MED ORDER — RIVAROXABAN 10 MG PO TABS: ORAL_TABLET | ORAL | 5 refills | Status: AC

## 2024-01-13 MED ORDER — AMLODIPINE BESYLATE 5 MG PO TABS: 5.0000 mg | ORAL_TABLET | Freq: Every day | ORAL | 1 refills | Status: AC

## 2024-01-13 NOTE — Telephone Encounter (Signed)
 Patient also requesting benazepril , not on profile

## 2024-03-02 ENCOUNTER — Other Ambulatory Visit: Payer: Self-pay | Admitting: Nurse Practitioner

## 2024-03-02 DIAGNOSIS — K219 Gastro-esophageal reflux disease without esophagitis: Secondary | ICD-10-CM

## 2024-03-16 ENCOUNTER — Encounter: Payer: 59 | Admitting: Nurse Practitioner

## 2024-03-22 ENCOUNTER — Encounter: Payer: Self-pay | Admitting: Nurse Practitioner

## 2024-04-01 ENCOUNTER — Observation Stay (HOSPITAL_COMMUNITY)

## 2024-04-01 ENCOUNTER — Observation Stay (HOSPITAL_BASED_OUTPATIENT_CLINIC_OR_DEPARTMENT_OTHER)

## 2024-04-01 ENCOUNTER — Inpatient Hospital Stay (HOSPITAL_COMMUNITY)
Admission: EM | Admit: 2024-04-01 | Source: Home / Self Care | Attending: Internal Medicine | Admitting: Internal Medicine

## 2024-04-01 ENCOUNTER — Encounter (HOSPITAL_COMMUNITY): Payer: Self-pay | Admitting: Emergency Medicine

## 2024-04-01 ENCOUNTER — Other Ambulatory Visit: Payer: Self-pay

## 2024-04-01 ENCOUNTER — Emergency Department (HOSPITAL_COMMUNITY)

## 2024-04-01 DIAGNOSIS — E11319 Type 2 diabetes mellitus with unspecified diabetic retinopathy without macular edema: Secondary | ICD-10-CM | POA: Diagnosis not present

## 2024-04-01 DIAGNOSIS — K7460 Unspecified cirrhosis of liver: Secondary | ICD-10-CM | POA: Clinically undetermined

## 2024-04-01 DIAGNOSIS — E785 Hyperlipidemia, unspecified: Secondary | ICD-10-CM

## 2024-04-01 DIAGNOSIS — R7989 Other specified abnormal findings of blood chemistry: Secondary | ICD-10-CM | POA: Diagnosis not present

## 2024-04-01 DIAGNOSIS — Z86711 Personal history of pulmonary embolism: Secondary | ICD-10-CM

## 2024-04-01 DIAGNOSIS — Z794 Long term (current) use of insulin: Secondary | ICD-10-CM

## 2024-04-01 DIAGNOSIS — I2609 Other pulmonary embolism with acute cor pulmonale: Secondary | ICD-10-CM

## 2024-04-01 DIAGNOSIS — R55 Syncope and collapse: Secondary | ICD-10-CM | POA: Diagnosis not present

## 2024-04-01 DIAGNOSIS — E119 Type 2 diabetes mellitus without complications: Secondary | ICD-10-CM

## 2024-04-01 DIAGNOSIS — D696 Thrombocytopenia, unspecified: Secondary | ICD-10-CM | POA: Diagnosis present

## 2024-04-01 DIAGNOSIS — I10 Essential (primary) hypertension: Secondary | ICD-10-CM | POA: Diagnosis present

## 2024-04-01 DIAGNOSIS — E1169 Type 2 diabetes mellitus with other specified complication: Secondary | ICD-10-CM | POA: Diagnosis present

## 2024-04-01 DIAGNOSIS — K5904 Chronic idiopathic constipation: Secondary | ICD-10-CM

## 2024-04-01 DIAGNOSIS — I2699 Other pulmonary embolism without acute cor pulmonale: Secondary | ICD-10-CM | POA: Diagnosis not present

## 2024-04-01 DIAGNOSIS — R7401 Elevation of levels of liver transaminase levels: Secondary | ICD-10-CM | POA: Diagnosis present

## 2024-04-01 DIAGNOSIS — Z86718 Personal history of other venous thrombosis and embolism: Secondary | ICD-10-CM

## 2024-04-01 LAB — CBC
HCT: 40.3 % (ref 36.0–46.0)
Hemoglobin: 12.3 g/dL (ref 12.0–15.0)
MCH: 23.3 pg — ABNORMAL LOW (ref 26.0–34.0)
MCHC: 30.5 g/dL (ref 30.0–36.0)
MCV: 76.2 fL — ABNORMAL LOW (ref 80.0–100.0)
Platelets: 148 10*3/uL — ABNORMAL LOW (ref 150–400)
RBC: 5.29 MIL/uL — ABNORMAL HIGH (ref 3.87–5.11)
RDW: 16.1 % — ABNORMAL HIGH (ref 11.5–15.5)
WBC: 9.1 10*3/uL (ref 4.0–10.5)
nRBC: 0 % (ref 0.0–0.2)

## 2024-04-01 LAB — TROPONIN T, HIGH SENSITIVITY
Troponin T High Sensitivity: 21 ng/L — ABNORMAL HIGH (ref 0–19)
Troponin T High Sensitivity: 117 ng/L (ref 0–19)

## 2024-04-01 LAB — URINALYSIS, ROUTINE W REFLEX MICROSCOPIC
Bilirubin Urine: NEGATIVE
Glucose, UA: 150 mg/dL — AB
Hgb urine dipstick: NEGATIVE
Ketones, ur: NEGATIVE mg/dL
Leukocytes,Ua: NEGATIVE
Nitrite: NEGATIVE
Protein, ur: 30 mg/dL — AB
Specific Gravity, Urine: 1.012 (ref 1.005–1.030)
pH: 6 (ref 5.0–8.0)

## 2024-04-01 LAB — ECHOCARDIOGRAM COMPLETE
Area-P 1/2: 6.07 cm2
Height: 62.992 in
S' Lateral: 2.3 cm
Weight: 3840 [oz_av]

## 2024-04-01 LAB — COMPREHENSIVE METABOLIC PANEL WITH GFR
ALT: 70 U/L — ABNORMAL HIGH (ref 0–44)
AST: 75 U/L — ABNORMAL HIGH (ref 15–41)
Albumin: 3.6 g/dL (ref 3.5–5.0)
Alkaline Phosphatase: 103 U/L (ref 38–126)
Anion gap: 13 (ref 5–15)
BUN: 14 mg/dL (ref 6–20)
CO2: 22 mmol/L (ref 22–32)
Calcium: 9 mg/dL (ref 8.9–10.3)
Chloride: 105 mmol/L (ref 98–111)
Creatinine, Ser: 0.77 mg/dL (ref 0.44–1.00)
GFR, Estimated: >60 mL/min
Glucose, Bld: 208 mg/dL — ABNORMAL HIGH (ref 70–99)
Potassium: 3.7 mmol/L (ref 3.5–5.1)
Sodium: 139 mmol/L (ref 135–145)
Total Bilirubin: 0.4 mg/dL (ref 0.0–1.2)
Total Protein: 7.4 g/dL (ref 6.5–8.1)

## 2024-04-01 LAB — PRO BRAIN NATRIURETIC PEPTIDE
Pro Brain Natriuretic Peptide: <50 pg/mL
Pro Brain Natriuretic Peptide: <50 pg/mL

## 2024-04-01 LAB — CBG MONITORING, ED
Glucose-Capillary: 202 mg/dL — ABNORMAL HIGH (ref 70–99)
Glucose-Capillary: 157 mg/dL — ABNORMAL HIGH (ref 70–99)
Glucose-Capillary: 86 mg/dL (ref 70–99)
Glucose-Capillary: 95 mg/dL (ref 70–99)

## 2024-04-01 LAB — HIV ANTIBODY (ROUTINE TESTING W REFLEX): HIV Screen 4th Generation wRfx: NONREACTIVE

## 2024-04-01 LAB — LACTIC ACID, PLASMA: Lactic Acid, Venous: 2.3 mmol/L (ref 0.5–1.9)

## 2024-04-01 LAB — HEPARIN LEVEL (UNFRACTIONATED): Heparin Unfractionated: 0.94 [IU]/mL — ABNORMAL HIGH (ref 0.30–0.70)

## 2024-04-01 LAB — HEMOGLOBIN A1C
Hgb A1c MFr Bld: 7.5 % — ABNORMAL HIGH (ref 4.8–5.6)
Mean Plasma Glucose: 168.55 mg/dL

## 2024-04-01 MED ORDER — PANTOPRAZOLE SODIUM 20 MG PO TBEC
40.0000 mg | DELAYED_RELEASE_TABLET | Freq: Every day | ORAL | Status: AC
  Administered 2024-04-01 – 2024-04-02 (×2): 40 mg via ORAL
  Filled 2024-04-01 (×2): qty 2

## 2024-04-01 MED ORDER — SIMVASTATIN 20 MG PO TABS
20.0000 mg | ORAL_TABLET | Freq: Every day | ORAL | Status: AC
  Administered 2024-04-02: 20 mg via ORAL
  Filled 2024-04-01: qty 1

## 2024-04-01 MED ORDER — OXYCODONE HCL 5 MG PO TABS: 5.0000 mg | ORAL_TABLET | ORAL | Status: AC | PRN

## 2024-04-01 MED ORDER — ONDANSETRON HCL 4 MG PO TABS: 4.0000 mg | ORAL_TABLET | Freq: Four times a day (QID) | ORAL | Status: AC | PRN

## 2024-04-01 MED ORDER — INSULIN ASPART 100 UNIT/ML IJ SOLN
0.0000 [IU] | Freq: Every day | INTRAMUSCULAR | Status: AC
  Administered 2024-04-02: 3 [IU] via SUBCUTANEOUS
  Filled 2024-04-01: qty 3

## 2024-04-01 MED ORDER — ONDANSETRON HCL 4 MG/2ML IJ SOLN
4.0000 mg | Freq: Four times a day (QID) | INTRAMUSCULAR | Status: AC | PRN
  Administered 2024-04-01: 4 mg via INTRAVENOUS
  Filled 2024-04-01: qty 2

## 2024-04-01 MED ORDER — ACETAMINOPHEN 650 MG RE SUPP: 650.0000 mg | Freq: Four times a day (QID) | RECTAL | Status: AC | PRN

## 2024-04-01 MED ORDER — LUBIPROSTONE 24 MCG PO CAPS
24.0000 ug | ORAL_CAPSULE | Freq: Every day | ORAL | Status: AC
  Filled 2024-04-01 (×3): qty 1

## 2024-04-01 MED ORDER — VALSARTAN-HYDROCHLOROTHIAZIDE 160-25 MG PO TABS: 1.0000 | ORAL_TABLET | Freq: Every day | ORAL | Status: DC

## 2024-04-01 MED ORDER — ALBUTEROL SULFATE (2.5 MG/3ML) 0.083% IN NEBU: 2.5000 mg | INHALATION_SOLUTION | RESPIRATORY_TRACT | Status: AC | PRN

## 2024-04-01 MED ORDER — HEPARIN (PORCINE) 25000 UT/250ML-% IV SOLN
1150.0000 [IU]/h | INTRAVENOUS | Status: AC
  Administered 2024-04-01: 1300 [IU]/h via INTRAVENOUS
  Administered 2024-04-02: 1150 [IU]/h via INTRAVENOUS
  Filled 2024-04-01 (×3): qty 250

## 2024-04-01 MED ORDER — IOHEXOL 350 MG/ML SOLN
85.0000 mL | Freq: Once | INTRAVENOUS | Status: AC | PRN
  Administered 2024-04-01: 85 mL via INTRAVENOUS

## 2024-04-01 MED ORDER — ACETAMINOPHEN 325 MG PO TABS: 650.0000 mg | ORAL_TABLET | Freq: Four times a day (QID) | ORAL | Status: AC | PRN

## 2024-04-01 MED ORDER — HYDROCHLOROTHIAZIDE 25 MG PO TABS
25.0000 mg | ORAL_TABLET | Freq: Every day | ORAL | Status: AC
  Administered 2024-04-01 – 2024-04-02 (×2): 25 mg via ORAL
  Filled 2024-04-01 (×2): qty 1

## 2024-04-01 MED ORDER — INSULIN ASPART 100 UNIT/ML IJ SOLN
0.0000 [IU] | Freq: Three times a day (TID) | INTRAMUSCULAR | Status: AC
  Administered 2024-04-01: 3 [IU] via SUBCUTANEOUS
  Administered 2024-04-02: 2 [IU] via SUBCUTANEOUS
  Administered 2024-04-02: 3 [IU] via SUBCUTANEOUS
  Filled 2024-04-01: qty 2
  Filled 2024-04-01 (×2): qty 3

## 2024-04-01 MED ORDER — AMLODIPINE BESYLATE 5 MG PO TABS
5.0000 mg | ORAL_TABLET | Freq: Every day | ORAL | Status: AC
  Administered 2024-04-01 – 2024-04-02 (×2): 5 mg via ORAL
  Filled 2024-04-01 (×2): qty 1

## 2024-04-01 MED ORDER — HEPARIN BOLUS VIA INFUSION
5000.0000 [IU] | Freq: Once | INTRAVENOUS | Status: AC
  Administered 2024-04-01: 5000 [IU] via INTRAVENOUS
  Filled 2024-04-01: qty 5000

## 2024-04-01 MED ORDER — SODIUM CHLORIDE 0.9% FLUSH
3.0000 mL | Freq: Two times a day (BID) | INTRAVENOUS | Status: AC
  Administered 2024-04-01 – 2024-04-02 (×3): 3 mL via INTRAVENOUS

## 2024-04-01 MED ORDER — IRBESARTAN 300 MG PO TABS
150.0000 mg | ORAL_TABLET | Freq: Every day | ORAL | Status: AC
  Administered 2024-04-01 – 2024-04-02 (×2): 150 mg via ORAL
  Filled 2024-04-01 (×2): qty 1

## 2024-04-01 MED ORDER — METOPROLOL SUCCINATE ER 50 MG PO TB24
50.0000 mg | ORAL_TABLET | Freq: Every day | ORAL | Status: AC
  Administered 2024-04-01 – 2024-04-02 (×2): 50 mg via ORAL
  Filled 2024-04-01 (×2): qty 2

## 2024-04-01 NOTE — Progress Notes (Signed)
 Echocardiogram 2D Echocardiogram has been performed.  Victoria Padilla 04/01/2024, 10:20 AM

## 2024-04-01 NOTE — Consult Note (Signed)
 "  NAME:  Victoria Padilla, MRN:  [id number], DOB:  01-Mar-1965, LOS: 0 ADMISSION DATE:  04/01/2024, CONSULTATION DATE:  04/01/24 REFERRING MD:  Claudene SAUNDERS, CHIEF COMPLAINT:  dyspnea/PE   History of Present Illness:  Victoria Padilla is a 59 yo female with past medical history significant for HTN, HLD, OSA, DVT/PE on xarelto , DM2, sarcoidosis, GERD, Alpha-Thalassemia minor, morbid obesity who presented to ED s/p syncopal event with dyspnea. Patient takes Xarelto  at home for hx of PE, however has been out of the medication for 1-2 weeks. In ED she was tachycardic with elevated respirations and placed on 5L [place]. Imaging on arrival consistent with bilateral PE with right heart strain and she was started on a heparin  gtt. PCCM consulted for evaluation.  On exam, patient stable. Oxygenating well on 5L with SpO2 >98%; stopped oxygen and sats held 93-96%; placed on 2L after exam. Family at bedside. All questions addressed at that time.  Pertinent Medical History:   Past Medical History:  Diagnosis Date   Acid reflux    Acute non-recurrent frontal sinusitis 06/17/2022   Anemia    Arthritis    Asthma    Cervical dysplasia    Diabetes mellitus    DVT (deep venous thrombosis) (HCC)    Gallbladder problem    GERD (gastroesophageal reflux disease)    Heart murmur    Hx of blood clots    Hyperlipidemia    Hypertension    LBP (low back pain)    Liver problem    Obesity    Orthopnea 02/13/2018   OSA (obstructive sleep apnea) 03/22/2018   New diagnosis. Mild.    Personal history of noncompliance with medical treatment, presenting hazards to health 12/21/2015   Pulmonary embolism (HCC)    Rash and nonspecific skin eruption 06/17/2022   Reactive airway disease, mild intermittent, with acute exacerbation 06/17/2022   Sarcoidosis    Sleep apnea    Vitamin D  deficiency    Significant Hospital Events: Including procedures, antibiotic start and stop dates in addition to other pertinent events   2/5: Admit  bilateral PE->hep gtt  Interim History / Subjective:  PCCM consulted for evaluation  Objective    Blood pressure (!) 150/92, pulse 96, temperature 97.7 F (36.5 C), temperature source Oral, resp. rate 15, height 5' 2.99 (1.6 m), weight 108.9 kg, SpO2 100%.       No intake or output data in the 24 hours ending 04/01/24 1119 Filed Weights   04/01/24 0700  Weight: 108.9 kg   Examination: General: acutely-ill woman lying on stretcher, in NAD HEENT: AT/[place], PERRL,  Pulm: normal inspiratory and expiratory effort on [place] CV: RRR, no m/g/r GI: soft, non distended, non tender to palpation Extremities: 1+ edema BLE, mild pain L distal calf Neuro: A&O x3, no focal deficits   Resolved Problem List:   Assessment and Plan:   Bilateral PE Hx DVT/PE -PESI low risk -On Xarelto  at home; ran out of medications a couple weeks ago -CTA with bilateral PE w/ R heart strain -TTE EF 65%; mild LVH, RVSP 65.4 mmHg; mild-mod MR/TR -Venous duplex BLE negative DVT -proBNP <50 -Troponin elevated (21-->117)-trend -Trend lactic -Cont Heparin  gtt; plan to transition to home Xarelto  in the next couple days if remains stable -Continue oxygen supplementation to maintain SpO2 >92% -Prothombin/Factor V Leiden negative in past -Has seen Dr. Onesimo with heme/onc in past, son recently treated for DVT/PE-recommend f/u visit upon discharge for further testing -Encouraged patient to continue daily [facility] upon discharge -PCCM  will continue to follow along  Labs:  CBC: Recent Labs  Lab 04/01/24 0504  WBC 9.1  HGB 12.3  HCT 40.3  MCV 76.2*  PLT 148*    Basic Metabolic Panel: Recent Labs  Lab 04/01/24 0504  NA 139  K 3.7  CL 105  CO2 22  GLUCOSE 208*  BUN 14  CREATININE 0.77  CALCIUM 9.0   GFR: Estimated Creatinine Clearance: 88.5 mL/min (by C-G formula based on SCr of 0.77 mg/dL). Recent Labs  Lab 04/01/24 0504  WBC 9.1    Liver Function Tests: Recent Labs  Lab 04/01/24 0504  AST 75*  ALT  70*  ALKPHOS 103  BILITOT 0.4  PROT 7.4  ALBUMIN 3.6   No results for input(s): LIPASE, AMYLASE in the last 168 hours. No results for input(s): AMMONIA in the last 168 hours.  ABG    Component Value Date/Time   HCO3 27.6 08/15/2018 1456   TCO2 29 08/15/2018 1456   O2SAT 100.0 08/15/2018 1456     Coagulation Profile: No results for input(s): INR, PROTIME in the last 168 hours.  Cardiac Enzymes: No results for input(s): CKTOTAL, CKMB, CKMBINDEX, TROPONINI in the last 168 hours.  HbA1C: Hgb A1c MFr Bld  Date/Time Value Ref Range Status  03/11/2023 04:00 PM 11.4 (H) 4.8 - 5.6 % Final    Comment:             Prediabetes: 5.7 - 6.4          Diabetes: >6.4          Glycemic control for adults with diabetes: <7.0   11/07/2022 02:01 PM 9.3 (H) 4.6 - 6.5 % Final    Comment:    Glycemic Control Guidelines for People with Diabetes:Non Diabetic:  <6%Goal of Therapy: <7%Additional Action Suggested:  >8%    CBG: Recent Labs  Lab 04/01/24 0509  GLUCAP 202*   Review of Systems:   Review of systems completed with pertinent positives/negatives outlined in above HPI. Past Medical History:  She,  has a past medical history of Acid reflux, Acute non-recurrent frontal sinusitis (06/17/2022), Anemia, Arthritis, Asthma, Cervical dysplasia, Diabetes mellitus, DVT (deep venous thrombosis) (HCC), Gallbladder problem, GERD (gastroesophageal reflux disease), Heart murmur, blood clots, Hyperlipidemia, Hypertension, LBP (low back pain), Liver problem, Obesity, Orthopnea (02/13/2018), OSA (obstructive sleep apnea) (03/22/2018), Personal history of noncompliance with medical treatment, presenting hazards to health (12/21/2015), Pulmonary embolism (HCC), Rash and nonspecific skin eruption (06/17/2022), Reactive airway disease, mild intermittent, with acute exacerbation (06/17/2022), Sarcoidosis, Sleep apnea, and Vitamin D  deficiency.   Surgical History:   Past Surgical History:   Procedure Laterality Date   BREAST BIOPSY Left 2019   BUNIONECTOMY     CHOLECYSTECTOMY     COLPOSCOPY     CT SCAN  2025   GYNECOLOGIC CRYOSURGERY     KNEE ARTHROSCOPY     TUBAL LIGATION      Social History:   reports that she has never smoked. She has never used smokeless tobacco. She reports that she does not drink alcohol and does not use drugs.   Family History:  Her family history includes Alcoholism in her father; Cancer (age of onset: 11) in her brother; Colon cancer in her maternal grandfather; Colon polyps in her brother; Diabetes in her brother, brother, and mother; Heart disease in her father and mother; Heart failure in her father; Heart failure (age of onset: 39) in her mother; High blood pressure in her mother; Hyperlipidemia in her brother and brother; Hypertension  in her maternal uncle; Kidney disease in her mother; Obesity in her mother; Sudden death in her father and mother. There is no history of Stomach cancer, Esophageal cancer, Rectal cancer, Liver cancer, or Breast cancer.   Allergies Allergies[1]   Home Medications  Prior to Admission medications  Medication Sig Start Date End Date Taking? Authorizing Provider  Albuterol -Budesonide (AIRSUPRA ) 90-80 MCG/ACT AERO Inhale 1-2 Inhalations into the lungs every 4 (four) hours. As needed for cough, wheeze, shortness of breath. 06/17/22   Early, Camie BRAVO, NP  amLODipine  (NORVASC ) 5 MG tablet Take 1 tablet (5 mg total) by mouth daily. 01/13/24   Early, Sara E, NP  benzonatate  (TESSALON ) 100 MG capsule Take 1 capsule (100 mg total) by mouth every 8 (eight) hours. 03/30/23   White, Elizabeth A, PA-C  BESIVANCE 0.6 % SUSP  11/28/19   [provider]  cetirizine  (ZYRTEC ) 10 MG tablet Take 10 mg by mouth daily as needed for allergies.    [provider]  cholecalciferol (VITAMIN D ) 1000 units tablet Take 2,000 Units by mouth at bedtime.    [provider]  clobetasol  ointment (TEMOVATE ) 0.05 % APPLY  TOPICALLY TWICE A DAY X 4 WEEKS, THEN ONCE A DAY X 4 WEEKS, THEN TWICE A WEEK. Patient taking differently: Apply 1 Application topically daily as needed (Irritation). 12/28/20   Prentiss Annabella LABOR, NP  cyclobenzaprine  (FLEXERIL ) 10 MG tablet Take 0.5-1 tablets (5-10 mg total) by mouth 3 (three) times daily as needed for muscle spasms. May make you sleepy. Start at bedtime. Patient taking differently: Take 5-10 mg by mouth daily. May make you sleepy. Start at bedtime. 08/20/22   Early, Sara E, NP  Dapagliflozin  Pro-metFORMIN  ER (XIGDUO  XR) 06-998 MG TB24 Take 2 tablets by mouth daily. 07/18/22   [place]on [place], MD  diclofenac  Sodium (VOLTAREN ) 1 % GEL Apply 2 g topically 4 (four) times daily. 12/23/23   Jha, Panav, MD  Fluticasone -Umeclidin-Vilant (TRELEGY ELLIPTA ) 200-62.5-25 MCG/ACT AEPB Once a day. Patient taking differently: Inhale 1 puff into the lungs daily as needed (Asthma). 06/17/22   Early, Sara E, NP  Insulin  Glargine (BASAGLAR  KWIKPEN) 100 UNIT/ML Inject 40 Units into the skin every morning. 11/14/22   Thapa, Sudan, MD  levocetirizine (XYZAL ) 2.5 MG/5ML solution Take 10 mLs (5 mg total) by mouth every evening. Patient taking differently: Take 5 mg by mouth at bedtime as needed for allergies. 06/17/22   Early, Sara E, NP  lubiprostone  (AMITIZA ) 24 MCG capsule Take 1 capsule (24 mcg total) by mouth 2 (two) times daily with a meal. 10/20/23   Early, Camie BRAVO, NP  meloxicam  (MOBIC ) 15 MG tablet Take 1 tablet (15 mg total) by mouth daily. 12/23/23   Jha, Panav, MD  metoprolol  succinate (TOPROL -XL) 50 MG 24 hr tablet TAKE 1 TABLET BY MOUTH DAILY. TAKE WITH OR IMMEDIATELY FOLLOWING A MEAL. 03/02/24   Early, Sara E, NP  mometasone  (ELOCON ) 0.1 % cream Apply a thin layer to the skin once a day for rash. Patient taking differently: Apply 1 Application topically daily as needed (Rash). 06/17/22   Early, Sara E, NP  Olopatadine  HCl 0.2 % SOLN Place 2-4 drops in the eyes every 4-6 hours as needed for allergy  symptoms. 10/20/23   Early, Sara E, NP  Omega-3 Fatty Acids (FISH OIL PO) Take 1 capsule by mouth at bedtime. Reported on 08/08/2015    [provider]  omeprazole  (PRILOSEC) 40 MG capsule TAKE 1 CAPSULE (40 MG TOTAL) BY MOUTH DAILY. 03/02/24  Early, Sara E, NP  ondansetron  (ZOFRAN ) 4 MG tablet Take 4 mg by mouth every 8 (eight) hours as needed for nausea or vomiting.    [provider]  rivaroxaban  (XARELTO ) 10 MG TABS tablet TAKE 1 TABLET (10 MG TOTAL) BY MOUTH DAILY 01/13/24   Early, Sara E, NP  simvastatin  (ZOCOR ) 20 MG tablet TAKE 1 TABLET BY MOUTH EVERYDAY AT BEDTIME 04/08/22   Von [place], MD  tirzepatide  (MOUNJARO ) 10 MG/0.5ML Pen Inject 10 mg into the skin once a week. 11/14/22   Thapa, Sudan, MD  valsartan -hydrochlorothiazide  (DIOVAN -HCT) 160-25 MG tablet TAKE 1 TABLET BY MOUTH EVERY DAY 08/15/23   Early, Sara E, NP   Zarriah Starkel, DNP, AGACNP-BC [facility]  Please see [email or web address] for pager details.  From 7A-7P if no response, please call [phone]. After hours, please call ELink [phone].     [1]  Allergies Allergen Reactions   Naprosyn  [Naproxen ] Nausea Only   "

## 2024-04-01 NOTE — ED Triage Notes (Signed)
 Pt arrived from home after having a syncopal event, pt reported SOB after the syncopal episode. Pt has a hx of PE, is on eliquis for that. Pt hasn't taken her eliquis in the past month.

## 2024-04-01 NOTE — Progress Notes (Signed)
 Bilateral lower extremity venous duplex has been completed. Preliminary results can be found in CV Proc through chart review.   04/01/24 9:43 AM Cathlyn Collet RVT

## 2024-04-01 NOTE — Progress Notes (Signed)
 PHARMACY - ANTICOAGULATION CONSULT NOTE  Pharmacy Consult for heparin  Indication: pulmonary embolus  Allergies[1]  Patient Measurements:  Height: 5' 2.99 (160 cm) Weight: 108.9 kg (240 lb) IBW/kg (Calculated) : 52.38 HEPARIN  DW (KG): 78.5  Vital Signs: Temp: 97.2 F (36.2 C) (02/05 0501) Temp Source: Temporal (02/05 0501) BP: 154/80 (02/05 0545) Pulse Rate: 100 (02/05 0507)  Labs: Recent Labs    04/01/24 0504  HGB 12.3  HCT 40.3  PLT 148*  CREATININE 0.77    CrCl cannot be calculated (Unknown ideal weight.).   Medical History: Past Medical History:  Diagnosis Date   Acid reflux    Acute non-recurrent frontal sinusitis 06/17/2022   Anemia    Arthritis    Asthma    Cervical dysplasia    Diabetes mellitus    DVT (deep venous thrombosis) (HCC)    Gallbladder problem    GERD (gastroesophageal reflux disease)    Heart murmur    Hx of blood clots    Hyperlipidemia    Hypertension    LBP (low back pain)    Liver problem    Obesity    Orthopnea 02/13/2018   OSA (obstructive sleep apnea) 03/22/2018   New diagnosis. Mild.    Personal history of noncompliance with medical treatment, presenting hazards to health 12/21/2015   Pulmonary embolism (HCC)    Rash and nonspecific skin eruption 06/17/2022   Reactive airway disease, mild intermittent, with acute exacerbation 06/17/2022   Sarcoidosis    Sleep apnea    Vitamin D  deficiency     Medications:  (Not in a hospital admission)  Scheduled:  Infusions:   Assessment: Patient presents with shortness of breath. Has a history of PE and has not been compliant with her Xarelto  secondary to running out > 1 week prior. Pharmacy consulted to dose heparin  infusion.   CT PE pending. Anti-Xa should be reliable given remoteness of last Xarelto  dose. If elevated on initial results, will add aPTT.   Goal of Therapy:  Heparin  level 0.3-0.7 units/ml Monitor platelets by anticoagulation protocol: Yes   Plan:  Give 5000  units bolus x 1 Start heparin  infusion at 1300 units/hr Check anti-Xa level in 6 hours and daily while on heparin  Continue to monitor H&H and platelets  Victoria Padilla 04/01/2024,7:17 AM      [1]  Allergies Allergen Reactions   Naprosyn  [Naproxen ] Nausea Only

## 2024-04-01 NOTE — Discharge Planning (Signed)
 RNCM consulted in regards to medication assistance.  Pt has Medicare insurance coverage and is not eligible for Medication Assistance Through [facility]) program. RNCM suggests sending Rx to [facility] to fill and bring to pt at bedside prior to discharge. No further CM needs communicated at this time.

## 2024-04-01 NOTE — ED Notes (Signed)
 Patient transported to CT

## 2024-04-01 NOTE — ED Provider Notes (Signed)
" °  Physical Exam  BP (!) 154/80   Pulse 100   Temp (!) 97.2 F (36.2 C) (Temporal)   Resp 10   SpO2 95%   Physical Exam  Procedures  Procedures  ED Course / MDM   Clinical Course as of 04/01/24 0830  Thu Apr 01, 2024  0830 Received call from Dr. Landy regarding large bilateral pulmonary emboli with right heart strain, will page hospitalist and pulmonary [DR]    Clinical Course User Index [DR] Levander Houston, MD   Medical Decision Making Amount and/or Complexity of Data Reviewed Labs: ordered. Radiology: ordered.  Risk Prescription drug management.  Ho pe on chronic anticoagulation, out of xarelto  for 2 weeks, now with worsening dyspnea and new oxygen demand. Suspicious for pe in with history of same. Awaiting cta Plan heparin    Discussed CT results with pulmonary critical care who will consult Patient with significant clot load and right heart strain.  Radiology and critical care both aware of patient Care discussed with Dr. Claudene on-call for hospitalist for admission    Levander Houston, MD 04/01/24 [phone]  "

## 2024-04-01 NOTE — ED Notes (Signed)
 CCMD contacted to put patient on cardiac monitoring.

## 2024-04-01 NOTE — Progress Notes (Signed)
 ANTICOAGULATION CONSULT NOTE  Pharmacy Consult for Heparin  Indication: pulmonary embolus  Allergies[1]  Patient Measurements: Height: 5' 2.99 (160 cm) Weight: 108.9 kg (240 lb) IBW/kg (Calculated) : 52.38 Heparin  Dosing Weight: 78.5 kg  Vital Signs: Temp: 98.4 F (36.9 C) (02/05 1554) Temp Source: Oral (02/05 1554) BP: 156/94 (02/05 1554) Pulse Rate: 81 (02/05 1554)  Labs: Recent Labs    04/01/24 0504 04/01/24 1400  HGB 12.3  --   HCT 40.3  --   PLT 148*  --   HEPARINUNFRC  --  0.94*  CREATININE 0.77  --     Estimated Creatinine Clearance: 88.5 mL/min (by C-G formula based on SCr of 0.77 mg/dL).   Medical History: Past Medical History:  Diagnosis Date   Acid reflux    Acute non-recurrent frontal sinusitis 06/17/2022   Anemia    Arthritis    Asthma    Cervical dysplasia    Diabetes mellitus    DVT (deep venous thrombosis) (HCC)    Gallbladder problem    GERD (gastroesophageal reflux disease)    Heart murmur    Hx of blood clots    Hyperlipidemia    Hypertension    LBP (low back pain)    Liver problem    Obesity    Orthopnea 02/13/2018   OSA (obstructive sleep apnea) 03/22/2018   New diagnosis. Mild.    Personal history of noncompliance with medical treatment, presenting hazards to health 12/21/2015   Pulmonary embolism (HCC)    Rash and nonspecific skin eruption 06/17/2022   Reactive airway disease, mild intermittent, with acute exacerbation 06/17/2022   Sarcoidosis    Sleep apnea    Vitamin D  deficiency     Medications:  (Not in a hospital admission)  Scheduled:   insulin  aspart  0-15 Units Subcutaneous TID WC   insulin  aspart  0-5 Units Subcutaneous QHS   sodium chloride  flush  3 mL Intravenous Q12H   Infusions:   heparin  1,300 Units/hr (04/01/24 1633)   PRN: acetaminophen  **OR** acetaminophen , albuterol , ondansetron  **OR** ondansetron  (ZOFRAN ) IV, oxyCODONE   Assessment: Patient presents with shortness of breath. Has a history of PE  and has not been compliant with her Xarelto  secondary to running out > 1 week prior. Pharmacy consulted to dose heparin  infusion.    CT PE large bilateral PE w/ evidence of RHS  Started at 1300 units/hr following 5000 unit IV heparin  bolus.  Anti-Xa should be reliable given remoteness of last Xarelto  dose. If elevated on initial results, will add aPTT.   Level Monitoring: 2/5 1400 0.94: Supra-therapeutic. Drawn correctly.  No issues with infusion or bleeding per RN.  Hgb 12.3; plt 148  Goal of Therapy:  Heparin  level 0.3-0.7 units/ml Monitor platelets by anticoagulation protocol: Yes   Plan:  Decrease heparin  infusion to 1150 units/hr Check anti-Xa level at 11p  Daily heparin  level while on heparin  Continue to monitor H&H and platelets  Dorn Buttner, PharmD, BCPS 04/01/2024 4:57 PM ED Clinical Pharmacist -  [phone]        [1]  Allergies Allergen Reactions   Naprosyn  [Naproxen ] Nausea Only

## 2024-04-01 NOTE — ED Provider Notes (Signed)
 " [facility] Provider Note   CSN: [id number] Arrival date & time: 04/01/24  0459     History Chief Complaint  Patient presents with   Loss of Consciousness    HPI Victoria Padilla is a 59 y.o. female presenting for chief complaint of shortness of breath.  59 year old female with a history of PEs.  States she has not been compliant on her Xarelto  because she ran out of bed a little over a week ago. States has been feeling short of breath since the event.  States that she had no other symptoms leading into this tonight. Severe hypoxia to the 70s with EMS started on 6 L nasal cannula.  Patient's recorded medical, surgical, social, medication list and allergies were reviewed in the Snapshot window as part of the initial history.   Review of Systems   Review of Systems  Constitutional:  Positive for diaphoresis and fatigue. Negative for chills and fever.  HENT:  Negative for ear pain and sore throat.   Eyes:  Negative for pain and visual disturbance.  Respiratory:  Positive for shortness of breath. Negative for cough.   Cardiovascular:  Negative for chest pain and palpitations.  Gastrointestinal:  Negative for abdominal pain and vomiting.  Genitourinary:  Negative for dysuria and hematuria.  Musculoskeletal:  Negative for arthralgias and back pain.  Skin:  Negative for color change and rash.  Neurological:  Negative for seizures and syncope.  All other systems reviewed and are negative.   Physical Exam Updated Vital Signs BP (!) 154/80   Pulse 100   Temp (!) 97.2 F (36.2 C) (Temporal)   Resp 10   SpO2 95%  Physical Exam Constitutional:      General: She is not in acute distress.    Appearance: She is not ill-appearing or toxic-appearing.  HENT:     Head: Normocephalic and atraumatic.  Eyes:     Extraocular Movements: Extraocular movements intact.     Pupils: Pupils are equal, round, and reactive to light.  Cardiovascular:      Rate and Rhythm: Tachycardia present.  Pulmonary:     Effort: Tachypnea present. No respiratory distress.  Abdominal:     General: Abdomen is flat.  Musculoskeletal:        General: No swelling, deformity or signs of injury.     Cervical back: Normal range of motion. No rigidity.  Skin:    General: Skin is warm and dry.  Neurological:     General: No focal deficit present.     Mental Status: She is alert and oriented to person, place, and time.  Psychiatric:        Mood and Affect: Mood normal.      ED Course/ Medical Decision Making/ A&P    Procedures .Critical Care  Performed by: Jerral Meth, MD Authorized by: Jerral Meth, MD   Critical care provider statement:    Critical care time (minutes):  30   Critical care was necessary to treat or prevent imminent or life-threatening deterioration of the following conditions:  Respiratory failure and circulatory failure   Critical care was time spent personally by me on the following activities:  Development of treatment plan with patient or surrogate, discussions with consultants, evaluation of patient's response to treatment, examination of patient, ordering and review of laboratory studies, ordering and review of radiographic studies, ordering and performing treatments and interventions, pulse oximetry, re-evaluation of patient's condition and review of old charts  Medications Ordered in ED Medications - No data to display  Medical Decision Making:   [name] is a 59 y.o. female who presented to the ED today with shortness of breath and syncope detailed above.    Patient placed on continuous vitals and telemetry monitoring while in ED which was reviewed periodically.  Complete initial physical exam performed, notably the patient  was severely tachypneic and hypoxic requiring 6 L nasal cannula administered.    Reviewed and confirmed nursing documentation for past medical history, family history, social  history.    Initial Assessment:   59 year old female presenting with high risk syncope in the setting of nonadherence to anticoagulation with a history of severe PE. Tachycardic on arrival, tachypneic and severely hypoxic grossly concerning for recurrent pulmonary embolism event.  ACS, pneumothorax, pneumonia, viral infection all would be on the differential with similar presentations.  Will evaluate with CT angiography, lab work including EKG/troponin/metabolic and blood counts. Will reassess patient following these interventions.  Reassessment and Plan:   CT scan pending at time of handoff to oncoming team.  Disposition per findings.   Clinical Impression:  1. Syncope and collapse      Data Unavailable   Final Clinical Impression(s) / ED Diagnoses Final diagnoses:  Syncope and collapse    Rx / DC Orders ED Discharge Orders     None         Jerral Meth, MD 04/01/24 [id number]  "

## 2024-04-01 NOTE — H&P (Signed)
 " History and Physical    Patient: Victoria Padilla:[id number] DOB: Feb 19, 1966 DOA: 04/01/2024 DOS: the patient was seen and examined on 04/01/2024 PCP: Oris Camie BRAVO, NP  Patient coming from: Home via EMS  Chief Complaint:  Chief Complaint  Patient presents with   Loss of Consciousness   HPI: Victoria Padilla is a 59 y.o. female with medical history significant of hypertension, hyperlipidemia, DVT/PE, diabetes mellitus type 2, sarcoidosis, reactive airway disease, GERD, OSA, and morbid obesity who presents with syncope and a history of anticoagulation interruption.  She experienced a syncopal episode this morning while preparing to start work. She has been without her anticoagulant medication, Xarelto , for approximately two weeks due to a delay in picking up the prescription.   Prior to the episode, she felt fine upon waking but noticed a peculiar sensation in her left foot, described as feeling like 'a bar was under' it. She also experienced a leg cramp in the left leg earlier that morning. No pain or shortness of breath was noted at the time of the episode.  She works in a sedentary job, sitting for prolonged periods exceeding four hours.   In the emergency department patient was noted be afebrile with heart rates elevated up to 102, respirations 25, blood pressure elevated up to 169/102, and O2 saturation currently maintained on 5 L nasal cannula oxygen.  Labs noted hemoglobin 12.3, platelets 148, and glucose 208.  CT angiogram of the chest noted large bilateral pulmonary emboli at the bifurcation with evidence of right heart strain.  Patient was started on heparin  drip with bolus.  Review of Systems: As mentioned in the history of present illness. All other systems reviewed and are negative. Past Medical History:  Diagnosis Date   Acid reflux    Acute non-recurrent frontal sinusitis 06/17/2022   Anemia    Arthritis    Asthma    Cervical dysplasia    Diabetes mellitus    DVT (deep  venous thrombosis) (HCC)    Gallbladder problem    GERD (gastroesophageal reflux disease)    Heart murmur    Hx of blood clots    Hyperlipidemia    Hypertension    LBP (low back pain)    Liver problem    Obesity    Orthopnea 02/13/2018   OSA (obstructive sleep apnea) 03/22/2018   New diagnosis. Mild.    Personal history of noncompliance with medical treatment, presenting hazards to health 12/21/2015   Pulmonary embolism (HCC)    Rash and nonspecific skin eruption 06/17/2022   Reactive airway disease, mild intermittent, with acute exacerbation 06/17/2022   Sarcoidosis    Sleep apnea    Vitamin D  deficiency    Past Surgical History:  Procedure Laterality Date   BREAST BIOPSY Left 2019   BUNIONECTOMY     CHOLECYSTECTOMY     COLPOSCOPY     CT SCAN  2025   GYNECOLOGIC CRYOSURGERY     KNEE ARTHROSCOPY     TUBAL LIGATION     Social History:  reports that she has never smoked. She has never used smokeless tobacco. She reports that she does not drink alcohol and does not use drugs.  Allergies[1]  Family History  Problem Relation Age of Onset   Heart failure Mother 36       chf   Diabetes Mother    High blood pressure Mother    Heart disease Mother    Sudden death Mother    Kidney disease Mother  Obesity Mother    Heart failure Father    Heart disease Father    Sudden death Father    Alcoholism Father    Cancer Brother 51       prostate   Hypertension Maternal Uncle    Hyperlipidemia Brother    Diabetes Brother    Colon cancer Maternal Grandfather    Colon polyps Brother    Diabetes Brother    Hyperlipidemia Brother    Stomach cancer Neg Hx    Esophageal cancer Neg Hx    Rectal cancer Neg Hx    Liver cancer Neg Hx    Breast cancer Neg Hx     Prior to Admission medications  Medication Sig Start Date End Date Taking? Authorizing Provider  Albuterol -Budesonide (AIRSUPRA ) 90-80 MCG/ACT AERO Inhale 1-2 Inhalations into the lungs every 4 (four) hours. As needed  for cough, wheeze, shortness of breath. 06/17/22   Early, Camie BRAVO, NP  amLODipine  (NORVASC ) 5 MG tablet Take 1 tablet (5 mg total) by mouth daily. 01/13/24   Early, Sara E, NP  azithromycin  (ZITHROMAX ) 250 MG tablet Take first 2 tablets together, then 1 every day until finished. Patient not taking: Reported on 11/11/2023 03/30/23   Teresa Almarie LABOR, PA-C  BD PEN NEEDLE SHORT ULTRAFINE 31G X 8 MM MISC USE ONE NEEDLE WITH EACH DOSE. 08/21/23   Dartha Ernst, MD  benzonatate  (TESSALON ) 100 MG capsule Take 1 capsule (100 mg total) by mouth every 8 (eight) hours. 03/30/23   White, Elizabeth A, PA-C  BESIVANCE 0.6 % SUSP  11/28/19   [provider]  Blood Glucose Monitoring Suppl (ONETOUCH VERIO) w/Device KIT Use device to check blood glucose levels as directed 04/15/23   Thapa, Sudan, MD  cetirizine  (ZYRTEC ) 10 MG tablet Take 10 mg by mouth daily.    [provider]  cholecalciferol (VITAMIN D ) 1000 units tablet Take 2,000 Units by mouth at bedtime.    [provider]  clobetasol  ointment (TEMOVATE ) 0.05 % APPLY TOPICALLY TWICE A DAY X 4 WEEKS, THEN ONCE A DAY X 4 WEEKS, THEN TWICE A WEEK. 12/28/20   Prentiss Riggs A, NP  cyclobenzaprine  (FLEXERIL ) 10 MG tablet Take 0.5-1 tablets (5-10 mg total) by mouth 3 (three) times daily as needed for muscle spasms. May make you sleepy. Start at bedtime. Patient taking differently: Take 5-10 mg by mouth daily. May make you sleepy. Start at bedtime. 08/20/22   Early, Sara E, NP  Dapagliflozin  Pro-metFORMIN  ER (XIGDUO  XR) 06-998 MG TB24 Take 2 tablets by mouth daily. 07/18/22   Von [place], MD  diclofenac  Sodium (VOLTAREN ) 1 % GEL Apply 2 g topically 4 (four) times daily. 12/23/23   Jha, Panav, MD  Fluticasone -Umeclidin-Vilant (TRELEGY ELLIPTA ) 200-62.5-25 MCG/ACT AEPB Once a day. 06/17/22   Early, Sara E, NP  glucose blood (FREESTYLE LITE) test strip 1 each by Other route 2 (two) times daily. And lancets 2/day Patient not taking: Reported on  12/23/2023 03/20/21   Kassie Mallick, MD  glucose blood [facility] VERIO) test strip USE AS DIRECTED TO CHECK BLOOD SUGAR TWICE A DAY 06/25/23   Thapa, Sudan, MD  Insulin  Glargine (BASAGLAR  KWIKPEN) 100 UNIT/ML Inject 40 Units into the skin every morning. 11/14/22   Thapa, Sudan, MD  levocetirizine (XYZAL ) 2.5 MG/5ML solution Take 10 mLs (5 mg total) by mouth every evening. 06/17/22   Early, Sara E, NP  lubiprostone  (AMITIZA ) 24 MCG capsule Take 1 capsule (24 mcg total) by mouth 2 (two) times daily with a meal. 10/20/23  Early, Sara E, NP  meloxicam  (MOBIC ) 15 MG tablet Take 1 tablet (15 mg total) by mouth daily. 12/23/23   Jha, Panav, MD  metoprolol  succinate (TOPROL -XL) 50 MG 24 hr tablet TAKE 1 TABLET BY MOUTH DAILY. TAKE WITH OR IMMEDIATELY FOLLOWING A MEAL. 03/02/24   Early, Sara E, NP  mometasone  (ELOCON ) 0.1 % cream Apply a thin layer to the skin once a day for rash. 06/17/22   Early, Sara E, NP  Olopatadine  HCl 0.2 % SOLN Place 2-4 drops in the eyes every 4-6 hours as needed for allergy symptoms. 10/20/23   Early, Sara E, NP  Omega-3 Fatty Acids (FISH OIL PO) Take 1 capsule by mouth at bedtime. Reported on 08/08/2015    [provider]  omeprazole  (PRILOSEC) 40 MG capsule TAKE 1 CAPSULE (40 MG TOTAL) BY MOUTH DAILY. 03/02/24   Early, Sara E, NP  ondansetron  (ZOFRAN ) 4 MG tablet Take 4 mg by mouth every 8 (eight) hours as needed for nausea or vomiting.    [provider]  rivaroxaban  (XARELTO ) 10 MG TABS tablet TAKE 1 TABLET (10 MG TOTAL) BY MOUTH DAILY 01/13/24   Early, Sara E, NP  simvastatin  (ZOCOR ) 20 MG tablet TAKE 1 TABLET BY MOUTH EVERYDAY AT BEDTIME 04/08/22   Von [place], MD  tirzepatide  (MOUNJARO ) 10 MG/0.5ML Pen Inject 10 mg into the skin once a week. 11/14/22   Thapa, Sudan, MD  valsartan -hydrochlorothiazide  (DIOVAN -HCT) 160-25 MG tablet TAKE 1 TABLET BY MOUTH EVERY DAY 08/15/23   Oris Camie BRAVO, NP    Physical Exam: Vitals:   04/01/24 0700 04/01/24 0720 04/01/24 0724 04/01/24  0815  BP:  (!) 150/75  (!) 150/92  Pulse:  92  96  Resp:  17  15  Temp:   97.7 F (36.5 C)   TempSrc:   Oral   SpO2:  100%  100%  Weight: 108.9 kg     Height: 5' 2.99 (1.6 m)         Constitutional: Older adult female who appears to be in no acute distress at this time Eyes: PERRL, lids and conjunctivae normal ENMT: Mucous membranes are moist. Normal dentition.  Neck: normal, supple  Respiratory: clear to auscultation bilaterally, no wheezing, no crackles.  Saturations currently maintained on 5 L of nasal cannula oxygen. Cardiovascular: Regular rate and rhythm, no murmurs / rubs / gallops.  Trace lower extremity edema present. Abdomen: no tenderness, no masses palpated.  Bowel sounds positive.  Musculoskeletal: no clubbing / cyanosis. No joint deformity upper and lower extremities. Good ROM, no contractures. Normal muscle tone.  Skin: no rashes, lesions, ulcers. No induration Neurologic: CN 2-12 grossly intact. . Strength 5/5 in all 4.  Psychiatric: Normal judgment and insight. Alert and oriented x 3. Normal mood.   Data Reviewed:  EKG reveals sinus rhythm at 99 bpm with left atrial enlargement.  Reviewed labs, imaging, and pertinent records as documented.  Assessment and Plan: Syncopal and collapse secondary to bilateral pulmonary embolism History of DVT/pulmonary embolism Patient presents to having syncopal episode at home.  Prior history of pulmonary embolism and DVT.  After coming to reported left leg discomfort after getting up.  She had been off of the Xarelto  for a couple of weeks and needing to pick it up from the pharmacy.  proBNP was less than 50.  CT scan revealed bilateral pulmonary embolism with significant right heart strain.  Patient was started on heparin  drip by pharmacy with bolus, and pulmonary consulted to evaluate for concerns for  right heart strain. - Admit to a progressive bed - Continuous pulse oximetry with oxygen to maintain O2 saturations greater than  92%.  Wean as tolerated - Continue heparin  drip per pharmacy - Check high-sensitivity troponin (21 -> 117) and lactic acid - Check Doppler ultrasound of extremities and echocardiogram - Appreciate PCCM consultative services, follow-up for any further recommendations.  Elevated troponin Acute.  Initial high-sensitivity troponin mildly elevated at 21.    - Continue to trend high-sensitivity troponin  Diabetes mellitus type 2, with long-term use of insulin  On admission glucose noted to be 208.  Last available hemoglobin A1c noted to be 11.4 when checked back in 02/2023 - Hypoglycemic protocols - Hold Xigduo  XR - CBGs before every meal with moderate SSI - Adjust insulin  regimen as deemed medically appropriate.  Essential hypertension Blood pressures noted to be elevated up to 169/102 - Continue amlodipine , metoprolol , valsartan -hydrochlorothiazide   Thrombocytopenia Chronic.  Platelet count noted to be 148.  Possibly associated with CT findings of the liver. - Continue to monitor  Possible hepatic cirrhosis Transaminitis Acute.  AST 75 and ALT 70.  CT imaging noted findings consistent with hepatic cirrhosis.  Patient does not report any significant alcohol history.  Question possibility of NASH. - Continue to monitor  Hyperlipidemia - Continue simvastatin .  Recommend outpatient follow-up to monitor liver functions studies  Morbid obesity BMI 42.52 kg/m   DVT prophylaxis: Heparin   Advance Care Planning:   Code Status: Full Code    Consults: PCCM  Family Communication: Family updated at bedside  Severity of Illness: The appropriate patient status for this patient is INPATIENT. Inpatient status is judged to be reasonable and necessary in order to provide the required intensity of service to ensure the patient's safety. The patient's presenting symptoms, physical exam findings, and initial radiographic and laboratory data in the context of their chronic comorbidities is felt to  place them at high risk for further clinical deterioration. Furthermore, it is not anticipated that the patient will be medically stable for discharge from the hospital within 2 midnights of admission.   * I certify that at the point of admission it is my clinical judgment that the patient will require inpatient hospital care spanning beyond 2 midnights from the point of admission due to high intensity of service, high risk for further deterioration and high frequency of surveillance required.*  Author: Maximino DELENA Sharps, MD 04/01/2024 8:49 AM  For on call review [email or web address].      [1]  Allergies Allergen Reactions   Naprosyn  [Naproxen ] Nausea Only   "

## 2024-04-02 ENCOUNTER — Telehealth: Payer: Self-pay

## 2024-04-02 LAB — CBC
HCT: 39.8 % (ref 36.0–46.0)
Hemoglobin: 12.1 g/dL (ref 12.0–15.0)
MCH: 22.8 pg — ABNORMAL LOW (ref 26.0–34.0)
MCHC: 30.4 g/dL (ref 30.0–36.0)
MCV: 75 fL — ABNORMAL LOW (ref 80.0–100.0)
Platelets: 160 10*3/uL (ref 150–400)
RBC: 5.31 MIL/uL — ABNORMAL HIGH (ref 3.87–5.11)
RDW: 15.9 % — ABNORMAL HIGH (ref 11.5–15.5)
WBC: 7 10*3/uL (ref 4.0–10.5)
nRBC: 0 % (ref 0.0–0.2)

## 2024-04-02 LAB — CBG MONITORING, ED
Glucose-Capillary: 115 mg/dL — ABNORMAL HIGH (ref 70–99)
Glucose-Capillary: 135 mg/dL — ABNORMAL HIGH (ref 70–99)
Glucose-Capillary: 174 mg/dL — ABNORMAL HIGH (ref 70–99)

## 2024-04-02 LAB — TROPONIN T, HIGH SENSITIVITY: Troponin T High Sensitivity: 34 ng/L — ABNORMAL HIGH (ref 0–19)

## 2024-04-02 LAB — COMPREHENSIVE METABOLIC PANEL WITH GFR
ALT: 53 U/L — ABNORMAL HIGH (ref 0–44)
AST: 40 U/L (ref 15–41)
Albumin: 3.5 g/dL (ref 3.5–5.0)
Alkaline Phosphatase: 98 U/L (ref 38–126)
Anion gap: 8 (ref 5–15)
BUN: 11 mg/dL (ref 6–20)
CO2: 29 mmol/L (ref 22–32)
Calcium: 9.3 mg/dL (ref 8.9–10.3)
Chloride: 105 mmol/L (ref 98–111)
Creatinine, Ser: 0.88 mg/dL (ref 0.44–1.00)
GFR, Estimated: >60 mL/min
Glucose, Bld: 138 mg/dL — ABNORMAL HIGH (ref 70–99)
Potassium: 4.2 mmol/L (ref 3.5–5.1)
Sodium: 141 mmol/L (ref 135–145)
Total Bilirubin: 0.4 mg/dL (ref 0.0–1.2)
Total Protein: 7 g/dL (ref 6.5–8.1)

## 2024-04-02 LAB — GLUCOSE, CAPILLARY: Glucose-Capillary: 155 mg/dL — ABNORMAL HIGH (ref 70–99)

## 2024-04-02 LAB — HEPARIN LEVEL (UNFRACTIONATED)
Heparin Unfractionated: 0.44 [IU]/mL (ref 0.30–0.70)
Heparin Unfractionated: 0.54 [IU]/mL (ref 0.30–0.70)

## 2024-04-02 LAB — I-STAT CG4 LACTIC ACID, ED: Lactic Acid, Venous: 1.2 mmol/L (ref 0.5–1.9)

## 2024-04-02 LAB — LACTIC ACID, PLASMA: Lactic Acid, Venous: 1.4 mmol/L (ref 0.5–1.9)

## 2024-04-02 NOTE — Progress Notes (Signed)
 " Progress Note   Patient: Victoria Padilla FMW:[id number] DOB: [birth year] DOA: 04/01/2024     1 DOS: the patient was seen and examined on 04/02/2024    Brief hospital course:  Victoria Padilla is a 59 y.o. woman with PMH of hypertension, hyperlipidemia, DVT/PE, diabetes mellitus type 2, sarcoidosis, reactive airway disease, GERD, OSA, and morbid obesity who presented with syncope and a history of anticoagulation interruption for 2 weeks and was found to have an acute pulmonary embolism with strain.  Assessment and Plan:  Acute PE Presented with syncope and collapse. History of DVT/PE in the past (2020). Patient has been on Xarelto , but has been off her medication for a couple of weeks as she did not pick it up from the pharmacy due to some transport issues and the bad weather. CT scan revealed bilateral pulmonary embolism with significant right heart strain.  Patient was started on heparin  drip by pharmacy with bolus, and pulmonary consulted to evaluate for concerns for right heart strain. Duplex negative for DVT in lower extremities. -Continue IV heparin  infusion for 48 hours per pulmonology. - Continue supplemental oxygen and wean as tolerated. - Plan to transition back to Xarelto  on 04/03/2024 around 8 AM.  Will prescribe with loading dose (Xarelto  15 mg twice daily for 21 days and then transition to 20 mg daily).  Elevated troponin Acute.  Initial high-sensitivity troponin mildly elevated at 21.    Likely due to PE. - Continue to trend high-sensitivity troponin   Diabetes mellitus type 2, with long-term use of insulin  On admission glucose noted to be 208.  Last available hemoglobin A1c noted to be 11.4 when checked back in 02/2023. HbA1c this admission: 7.5. Patient is on Mounjaro , Lantus  insulin , dapagliflozin -metformin  at home -Continue sliding scale insulin  - Hold home oral hypoglycemics. - Diabetic diet.   Essential hypertension Blood pressures noted to be elevated up to 169/102 -  Continue amlodipine , metoprolol , valsartan -hydrochlorothiazide     Liver cirrhosis Transaminitis, mild CT imaging noted findings consistent with hepatic cirrhosis.  Patient does not report any significant alcohol history.  Likely due to MASLD. No evidence of decompensation. - Continue to monitor   Hyperlipidemia - Continue simvastatin .    Class III obesity Body mass index is 42.52 kg/m.  -Resume home Mounjaro  at discharge. - Outpatient follow-up.      Subjective: Patient states she is feeling well.  Denies chest pain.  Remains on supplemental oxygen.  Physical Exam: BP (!) 198/90   Pulse (!) 103   Temp 98.2 F (36.8 C) (Oral)   Resp 19   Ht 5' 2.99 (1.6 m)   Wt 108.9 kg   SpO2 99%   BMI 42.52 kg/m    General: Alert, oriented X3  Eyes: Pupils equal, reactive  Oral cavity: moist mucous membranes  Head: Atraumatic, normocephalic  Neck: supple  Chest: clear to auscultation. No crackles, no wheezes  CVS: S1,S2 RRR. No murmurs  Abd: No distention, soft, non-tender. No masses palpable  Extr: No edema   MSK: No joint deformities or swelling  Neurological: Grossly intact.    Data Reviewed:    Latest Ref Rng & Units 04/02/2024    5:24 AM 04/01/2024    5:04 AM 04/21/2023    3:25 PM  CBC  WBC 4.0 - 10.5 K/uL 7.0  9.1  6.9   Hemoglobin 12.0 - 15.0 g/dL 87.8  87.6  86.5   Hematocrit 36.0 - 46.0 % 39.8  40.3  43.9   Platelets 150 - 400 K/uL  160  148  142       Latest Ref Rng & Units 04/02/2024    2:24 AM 04/01/2024    5:04 AM 04/21/2023    3:25 PM  BMP  Glucose 70 - 99 mg/dL 861  791  802   BUN 6 - 20 mg/dL 11  14  12    Creatinine 0.44 - 1.00 mg/dL 9.11  9.22  9.07   Sodium 135 - 145 mmol/L 141  139  138   Potassium 3.5 - 5.1 mmol/L 4.2  3.7  4.1   Chloride 98 - 111 mmol/L 105  105  103   CO2 22 - 32 mmol/L 29  22  30    Calcium 8.9 - 10.3 mg/dL 9.3  9.0  9.4      Family Communication: n/a  Disposition: Status is: Inpatient Remains inpatient appropriate because:  On heparin  gtt.  DVT prophylaxis: Systemic anticoagulation with heparin  gtt.      Author: MDALA-GAUSI, Lillian Tigges AGATHA, MD 04/02/2024 12:12 PM  For on call review [email or web address].    "

## 2024-04-02 NOTE — Progress Notes (Addendum)
 "  NAME:  Victoria Padilla, MRN:  [id number], DOB:  Dec 15, 1965, LOS: 1 ADMISSION DATE:  04/01/2024, CONSULTATION DATE:  04/01/24 REFERRING MD:  Claudene SAUNDERS, CHIEF COMPLAINT:  dyspnea/PE   History of Present Illness:  Victoria Padilla is a 59 yo female with past medical history significant for HTN, HLD, OSA, DVT/PE on xarelto , DM2, sarcoidosis, GERD, Alpha-Thalassemia minor, morbid obesity who presented to ED s/p syncopal event with dyspnea. Patient takes Xarelto  at home for hx of PE, however has been out of the medication for 1-2 weeks. In ED she was tachycardic with elevated respirations and placed on 5L [place]. Imaging on arrival consistent with bilateral PE with right heart strain and she was started on a heparin  gtt. PCCM consulted for evaluation.  On exam, patient stable. Oxygenating well on 5L with SpO2 >98%; stopped oxygen and sats held 93-96%; placed on 2L after exam. Family at bedside. All questions addressed at that time.  Pertinent Medical History:   Past Medical History:  Diagnosis Date   Acid reflux    Acute non-recurrent frontal sinusitis 06/17/2022   Anemia    Arthritis    Asthma    Cervical dysplasia    Diabetes mellitus    DVT (deep venous thrombosis) (HCC)    Gallbladder problem    GERD (gastroesophageal reflux disease)    Heart murmur    Hx of blood clots    Hyperlipidemia    Hypertension    LBP (low back pain)    Liver problem    Obesity    Orthopnea 02/13/2018   OSA (obstructive sleep apnea) 03/22/2018   New diagnosis. Mild.    Personal history of noncompliance with medical treatment, presenting hazards to health 12/21/2015   Pulmonary embolism (HCC)    Rash and nonspecific skin eruption 06/17/2022   Reactive airway disease, mild intermittent, with acute exacerbation 06/17/2022   Sarcoidosis    Sleep apnea    Vitamin D  deficiency    Significant Hospital Events: Including procedures, antibiotic start and stop dates in addition to other pertinent events   2/5: Admit  bilateral PE->hep gtt  Interim History / Subjective:    Objective    Blood pressure 137/76, pulse (!) 56, temperature 98.2 F (36.8 C), temperature source Oral, resp. rate 16, height 5' 2.99 (1.6 m), weight 108.9 kg, SpO2 100%.        Intake/Output Summary (Last 24 hours) at 04/02/2024 1243 Last data filed at 04/02/2024 1107 Gross per 24 hour  Intake 360.99 ml  Output 500 ml  Net -139.01 ml   Filed Weights   04/01/24 0700  Weight: 108.9 kg   Examination: General: acutely-ill woman lying on stretcher, in NAD HEENT: AT/[place], PERRL,  Pulm: normal inspiratory and expiratory effort on [place] CV: RRR, no m/g/r GI: soft, non distended, non tender to palpation Extremities: 1+ edema BLE, mild pain L distal calf Neuro: A&O x3, no focal deficits   Venous duplex negative for DVT   Resolved Problem List:   Assessment and Plan:   Bilateral PE in the setting of being off home AC, had run out of Xarelto . PESI low risk. Echo with normal EF, elevated PA pressure and no RV strain. BNP not elevated. Troponin mildly elevated. On room air.  Hx DVT/PE - Repeat lactic acid and troponin - Continue heparin  drip x 48 hours, plan to transition to Xarelto  tomorrow  - Supplemental oxygen as needed to maintain SpO2>92% - Prothombin/Factor V Leiden negative in past - Has seen Dr. Onesimo with heme/onc  in past, son recently treated for DVT/PE; recommend f/u visit upon discharge for further coagulopathy work up   PCCM will sign off but will continue to be available PRN.   Labs:  CBC: Recent Labs  Lab 04/01/24 0504 04/02/24 0524  WBC 9.1 7.0  HGB 12.3 12.1  HCT 40.3 39.8  MCV 76.2* 75.0*  PLT 148* 160    Basic Metabolic Panel: Recent Labs  Lab 04/01/24 0504 04/02/24 0224  NA 139 141  K 3.7 4.2  CL 105 105  CO2 22 29  GLUCOSE 208* 138*  BUN 14 11  CREATININE 0.77 0.88  CALCIUM 9.0 9.3   GFR: Estimated Creatinine Clearance: 80.5 mL/min (by C-G formula based on SCr of 0.88 mg/dL). Recent  Labs  Lab 04/01/24 0504 04/01/24 1104 04/02/24 0524  WBC 9.1  --  7.0  LATICACIDVEN  --  2.3*  --     Liver Function Tests: Recent Labs  Lab 04/01/24 0504 04/02/24 0224  AST 75* 40  ALT 70* 53*  ALKPHOS 103 98  BILITOT 0.4 0.4  PROT 7.4 7.0  ALBUMIN 3.6 3.5   No results for input(s): LIPASE, AMYLASE in the last 168 hours. No results for input(s): AMMONIA in the last 168 hours.  ABG    Component Value Date/Time   HCO3 27.6 08/15/2018 1456   TCO2 29 08/15/2018 1456   O2SAT 100.0 08/15/2018 1456     Coagulation Profile: No results for input(s): INR, PROTIME in the last 168 hours.  Cardiac Enzymes: No results for input(s): CKTOTAL, CKMB, CKMBINDEX, TROPONINI in the last 168 hours.  HbA1C: Hgb A1c MFr Bld  Date/Time Value Ref Range Status  04/01/2024 11:04 AM 7.5 (H) 4.8 - 5.6 % Final    Comment:    (NOTE) Diagnosis of Diabetes The following HbA1c ranges recommended by the American Diabetes Association (ADA) may be used as an aid in the diagnosis of diabetes mellitus.  Hemoglobin             Suggested A1C NGSP%              Diagnosis  <5.7                   Non Diabetic  5.7-6.4                Pre-Diabetic  >6.4                   Diabetic  <7.0                   Glycemic control for                       adults with diabetes.    03/11/2023 04:00 PM 11.4 (H) 4.8 - 5.6 % Final    Comment:             Prediabetes: 5.7 - 6.4          Diabetes: >6.4          Glycemic control for adults with diabetes: <7.0    CBG: Recent Labs  Lab 04/01/24 1148 04/01/24 1706 04/01/24 2131 04/02/24 0750 04/02/24 1128  GLUCAP 157* 86 95 115* 135*    Rexene LOISE Blush, [place] [facility] 04/02/24 12:48 PM  Please see [email or web address] for pager details.  From 7A-7P if no response, please call [phone] After hours, please call ELink [phone]  "

## 2024-04-02 NOTE — ED Notes (Signed)
 ED Pharm states no change in heparin  dose

## 2024-04-02 NOTE — Progress Notes (Signed)
 ANTICOAGULATION CONSULT NOTE Pharmacy Consult for Heparin  Indication: pulmonary embolus Brief A/P: Heparin  level within goal range  Continue Heparin  at current rate Allergies[1]  Patient Measurements: Height: 5' 2.99 (160 cm) Weight: 108.9 kg (240 lb) IBW/kg (Calculated) : 52.38 Heparin  Dosing Weight: 78.5 kg  Vital Signs: Temp: 98 F (36.7 C) (02/05 1907) Temp Source: Oral (02/05 1554) BP: 168/83 (02/05 2300) Pulse Rate: 73 (02/05 2300)  Labs: Recent Labs    04/01/24 0504 04/01/24 1400 04/01/24 2352  HGB 12.3  --   --   HCT 40.3  --   --   PLT 148*  --   --   HEPARINUNFRC  --  0.94* 0.54  CREATININE 0.77  --   --     Estimated Creatinine Clearance: 88.5 mL/min (by C-G formula based on SCr of 0.77 mg/dL).   Assessment: 59 y.o. female with PE for heparin   Goal of Therapy:  Heparin  level 0.3-0.7 units/ml Monitor platelets by anticoagulation protocol: Yes   Plan:  No change to heparin   Follow-up am labs.  Cathlyn Arrant, PharmD, BCPS          [1]  Allergies Allergen Reactions   Naprosyn  [Naproxen ] Nausea Only

## 2024-04-02 NOTE — Progress Notes (Signed)
 ANTICOAGULATION CONSULT NOTE  Pharmacy Consult for Heparin  Indication: pulmonary embolus  Allergies[1]  Patient Measurements: Height: 5' 2.99 (160 cm) Weight: 108.9 kg (240 lb) IBW/kg (Calculated) : 52.38 Heparin  Dosing Weight: 78.5 kg  Vital Signs: Temp: 98 F (36.7 C) (02/06 0600) Temp Source: Oral (02/06 0600) BP: 133/77 (02/06 0600) Pulse Rate: 57 (02/06 0600)  Labs: Recent Labs    04/01/24 0504 04/01/24 1400 04/01/24 2352 04/02/24 0224 04/02/24 0524  HGB 12.3  --   --   --  12.1  HCT 40.3  --   --   --  39.8  PLT 148*  --   --   --  160  HEPARINUNFRC  --  0.94* 0.54 0.44  --   CREATININE 0.77  --   --  0.88  --     Estimated Creatinine Clearance: 80.5 mL/min (by C-G formula based on SCr of 0.88 mg/dL).   Medical History: Past Medical History:  Diagnosis Date   Acid reflux    Acute non-recurrent frontal sinusitis 06/17/2022   Anemia    Arthritis    Asthma    Cervical dysplasia    Diabetes mellitus    DVT (deep venous thrombosis) (HCC)    Gallbladder problem    GERD (gastroesophageal reflux disease)    Heart murmur    Hx of blood clots    Hyperlipidemia    Hypertension    LBP (low back pain)    Liver problem    Obesity    Orthopnea 02/13/2018   OSA (obstructive sleep apnea) 03/22/2018   New diagnosis. Mild.    Personal history of noncompliance with medical treatment, presenting hazards to health 12/21/2015   Pulmonary embolism (HCC)    Rash and nonspecific skin eruption 06/17/2022   Reactive airway disease, mild intermittent, with acute exacerbation 06/17/2022   Sarcoidosis    Sleep apnea    Vitamin D  deficiency     Medications:  (Not in a hospital admission)  Scheduled:   amLODipine   5 mg Oral Daily   hydrochlorothiazide   25 mg Oral Daily   insulin  aspart  0-15 Units Subcutaneous TID WC   insulin  aspart  0-5 Units Subcutaneous QHS   irbesartan   150 mg Oral Daily   lubiprostone   24 mcg Oral Q breakfast   metoprolol  succinate  50 mg  Oral Daily   pantoprazole   40 mg Oral Daily   simvastatin   20 mg Oral q1800   sodium chloride  flush  3 mL Intravenous Q12H   Infusions:   heparin  1,150 Units/hr (04/02/24 0153)   PRN: acetaminophen  **OR** acetaminophen , albuterol , ondansetron  **OR** ondansetron  (ZOFRAN ) IV, oxyCODONE   Assessment: Patient presents with shortness of breath. Has a history of PE and has not been compliant with her Xarelto  secondary to running out > 1 week prior. Pharmacy consulted to dose heparin  infusion.    CT PE large bilateral PE w/ evidence of RHS  No issues with infusion or bleeding per RN.  Hgb 12.1; plt 160. Heparin  level therapeutic at 0.44 (2nd therapeutic level on current dose)   Goal of Therapy:  Heparin  level 0.3-0.7 units/ml Monitor platelets by anticoagulation protocol: Yes   Plan:  Continue heparin  infusion at 1150 units/hr Daily heparin  level while on heparin  Continue to monitor H&H and platelets  Rankin Sams, PharmD, BCCCP Clinical Pharmacist    [1]  Allergies Allergen Reactions   Naprosyn  [Naproxen ] Nausea Only

## 2024-04-09 ENCOUNTER — Encounter: Admitting: Nurse Practitioner
